# Patient Record
Sex: Female | Born: 1955 | Race: White | Hispanic: No | Marital: Married | Smoking: Former smoker
Health system: Southern US, Community
[De-identification: ages and names within clinical notes are randomized; demographics above are authoritative.]

## PROBLEM LIST (undated history)

## (undated) DIAGNOSIS — N952 Postmenopausal atrophic vaginitis: Secondary | ICD-10-CM

## (undated) DIAGNOSIS — E785 Hyperlipidemia, unspecified: Secondary | ICD-10-CM

## (undated) DIAGNOSIS — B059 Measles without complication: Secondary | ICD-10-CM

## (undated) DIAGNOSIS — K449 Diaphragmatic hernia without obstruction or gangrene: Principal | ICD-10-CM

## (undated) DIAGNOSIS — F329 Major depressive disorder, single episode, unspecified: Secondary | ICD-10-CM

## (undated) DIAGNOSIS — K219 Gastro-esophageal reflux disease without esophagitis: Secondary | ICD-10-CM

## (undated) DIAGNOSIS — M1990 Unspecified osteoarthritis, unspecified site: Secondary | ICD-10-CM

## (undated) DIAGNOSIS — B029 Zoster without complications: Secondary | ICD-10-CM

## (undated) DIAGNOSIS — R739 Hyperglycemia, unspecified: Secondary | ICD-10-CM

## (undated) DIAGNOSIS — Z0000 Encounter for general adult medical examination without abnormal findings: Principal | ICD-10-CM

## (undated) DIAGNOSIS — E559 Vitamin D deficiency, unspecified: Secondary | ICD-10-CM

## (undated) DIAGNOSIS — F32A Depression, unspecified: Secondary | ICD-10-CM

## (undated) DIAGNOSIS — B019 Varicella without complication: Secondary | ICD-10-CM

## (undated) DIAGNOSIS — C801 Malignant (primary) neoplasm, unspecified: Secondary | ICD-10-CM

## (undated) DIAGNOSIS — L738 Other specified follicular disorders: Secondary | ICD-10-CM

## (undated) HISTORY — DX: Vitamin D deficiency, unspecified: E55.9

## (undated) HISTORY — DX: Zoster without complications: B02.9

## (undated) HISTORY — DX: Gastro-esophageal reflux disease without esophagitis: K21.9

## (undated) HISTORY — DX: Varicella without complication: B01.9

## (undated) HISTORY — DX: Measles without complication: B05.9

## (undated) HISTORY — DX: Encounter for general adult medical examination without abnormal findings: Z00.00

## (undated) HISTORY — DX: Major depressive disorder, single episode, unspecified: F32.9

## (undated) HISTORY — DX: Hyperglycemia, unspecified: R73.9

## (undated) HISTORY — DX: Other specified follicular disorders: L73.8

## (undated) HISTORY — DX: Depression, unspecified: F32.A

## (undated) HISTORY — DX: Malignant (primary) neoplasm, unspecified: C80.1

## (undated) HISTORY — DX: Hyperlipidemia, unspecified: E78.5

## (undated) HISTORY — DX: Diaphragmatic hernia without obstruction or gangrene: K44.9

## (undated) HISTORY — DX: Postmenopausal atrophic vaginitis: N95.2

---

## 1977-01-13 HISTORY — PX: INDUCED ABORTION: SHX677

## 1996-01-14 HISTORY — PX: ABDOMINAL HYSTERECTOMY: SHX81

## 1996-01-14 LAB — HM PAP SMEAR: HM Pap smear: NORMAL

## 1997-11-28 ENCOUNTER — Ambulatory Visit (HOSPITAL_BASED_OUTPATIENT_CLINIC_OR_DEPARTMENT_OTHER): Admission: RE | Admit: 1997-11-28 | Discharge: 1997-11-28 | Payer: Self-pay | Admitting: *Deleted

## 1998-10-03 ENCOUNTER — Other Ambulatory Visit: Admission: RE | Admit: 1998-10-03 | Discharge: 1998-10-03 | Payer: Self-pay | Admitting: *Deleted

## 2004-12-30 ENCOUNTER — Encounter: Admission: RE | Admit: 2004-12-30 | Discharge: 2004-12-30 | Payer: Self-pay | Admitting: Family Medicine

## 2005-01-13 LAB — HM COLONOSCOPY: HM Colonoscopy: NORMAL

## 2008-01-14 DIAGNOSIS — C801 Malignant (primary) neoplasm, unspecified: Secondary | ICD-10-CM

## 2008-01-14 HISTORY — DX: Malignant (primary) neoplasm, unspecified: C80.1

## 2008-01-14 LAB — HM MAMMOGRAPHY: HM Mammogram: NORMAL

## 2008-01-27 ENCOUNTER — Encounter: Admission: RE | Admit: 2008-01-27 | Discharge: 2008-01-27 | Payer: Self-pay | Admitting: Gastroenterology

## 2008-01-31 ENCOUNTER — Ambulatory Visit: Payer: Self-pay | Admitting: Oncology

## 2008-02-07 ENCOUNTER — Ambulatory Visit (HOSPITAL_COMMUNITY): Admission: RE | Admit: 2008-02-07 | Discharge: 2008-02-07 | Payer: Self-pay | Admitting: Oncology

## 2008-02-08 ENCOUNTER — Encounter (INDEPENDENT_AMBULATORY_CARE_PROVIDER_SITE_OTHER): Payer: Self-pay | Admitting: Gastroenterology

## 2008-02-08 ENCOUNTER — Ambulatory Visit (HOSPITAL_COMMUNITY): Admission: RE | Admit: 2008-02-08 | Discharge: 2008-02-08 | Payer: Self-pay | Admitting: Gastroenterology

## 2008-02-16 ENCOUNTER — Ambulatory Visit: Admission: RE | Admit: 2008-02-16 | Discharge: 2008-04-19 | Payer: Self-pay | Admitting: Radiation Oncology

## 2008-03-06 ENCOUNTER — Ambulatory Visit (HOSPITAL_COMMUNITY): Admission: RE | Admit: 2008-03-06 | Discharge: 2008-03-06 | Payer: Self-pay | Admitting: Oncology

## 2008-03-06 LAB — MAGNESIUM: Magnesium: 2.3 mg/dL (ref 1.5–2.5)

## 2008-03-06 LAB — COMPREHENSIVE METABOLIC PANEL
ALT: 40 U/L — ABNORMAL HIGH (ref 0–35)
AST: 29 U/L (ref 0–37)
Albumin: 3.9 g/dL (ref 3.5–5.2)
Alkaline Phosphatase: 94 U/L (ref 39–117)
BUN: 16 mg/dL (ref 6–23)
CO2: 33 mEq/L — ABNORMAL HIGH (ref 19–32)
Calcium: 9.6 mg/dL (ref 8.4–10.5)
Chloride: 103 mEq/L (ref 96–112)
Creatinine, Ser: 0.66 mg/dL (ref 0.40–1.20)
Glucose, Bld: 98 mg/dL (ref 70–99)
Potassium: 4 mEq/L (ref 3.5–5.3)
Sodium: 141 mEq/L (ref 135–145)
Total Bilirubin: 0.4 mg/dL (ref 0.3–1.2)
Total Protein: 7.1 g/dL (ref 6.0–8.3)

## 2008-03-06 LAB — CBC WITH DIFFERENTIAL/PLATELET
BASO%: 0.3 % (ref 0.0–2.0)
Basophils Absolute: 0 10*3/uL (ref 0.0–0.1)
EOS%: 2.9 % (ref 0.0–7.0)
Eosinophils Absolute: 0.2 10*3/uL (ref 0.0–0.5)
HCT: 38.6 % (ref 34.8–46.6)
HGB: 12.8 g/dL (ref 11.6–15.9)
LYMPH%: 26.8 % (ref 14.0–49.7)
MCH: 31.4 pg (ref 25.1–34.0)
MCHC: 33.3 g/dL (ref 31.5–36.0)
MCV: 94.4 fL (ref 79.5–101.0)
MONO#: 0.4 10*3/uL (ref 0.1–0.9)
MONO%: 5.1 % (ref 0.0–14.0)
NEUT#: 4.7 10*3/uL (ref 1.5–6.5)
NEUT%: 64.9 % (ref 38.4–76.8)
Platelets: 347 10*3/uL (ref 145–400)
RBC: 4.09 10*6/uL (ref 3.70–5.45)
RDW: 12.7 % (ref 11.2–14.5)
WBC: 7.2 10*3/uL (ref 3.9–10.3)
lymph#: 1.9 10*3/uL (ref 0.9–3.3)

## 2008-03-06 LAB — FOLLICLE STIMULATING HORMONE: FSH: 101.8 m[IU]/mL

## 2008-03-07 LAB — TSH: TSH: 0.655 u[IU]/mL (ref 0.350–4.500)

## 2008-03-07 LAB — CEA: CEA: 3.4 ng/mL (ref 0.0–5.0)

## 2008-03-07 LAB — CREATININE CLEARANCE, URINE, 24 HOUR
Collection Interval-CRCL: 24 hours
Creatinine Clearance: 43 mL/min — ABNORMAL LOW (ref 75–115)
Creatinine, 24H Ur: 433 mg/d — ABNORMAL LOW (ref 700–1800)
Creatinine, Urine: 82.4 mg/dL
Creatinine: 0.66 mg/dL (ref 0.40–1.20)
Urine Total Volume-CRCL: 500 mL

## 2008-03-07 LAB — VITAMIN B12: Vitamin B-12: 1153 pg/mL — ABNORMAL HIGH (ref 211–911)

## 2008-03-07 LAB — VITAMIN D 25 HYDROXY (VIT D DEFICIENCY, FRACTURES): Vit D, 25-Hydroxy: 43 ng/mL (ref 30–89)

## 2008-03-09 LAB — BASIC METABOLIC PANEL
BUN: 16 mg/dL (ref 6–23)
CO2: 28 mEq/L (ref 19–32)
Calcium: 9.2 mg/dL (ref 8.4–10.5)
Chloride: 101 mEq/L (ref 96–112)
Creatinine, Ser: 0.69 mg/dL (ref 0.40–1.20)
Glucose, Bld: 121 mg/dL — ABNORMAL HIGH (ref 70–99)
Potassium: 3.7 mEq/L (ref 3.5–5.3)
Sodium: 135 mEq/L (ref 135–145)

## 2008-03-14 LAB — CBC WITH DIFFERENTIAL/PLATELET
BASO%: 0.1 % (ref 0.0–2.0)
Basophils Absolute: 0 10*3/uL (ref 0.0–0.1)
EOS%: 1.6 % (ref 0.0–7.0)
Eosinophils Absolute: 0.1 10*3/uL (ref 0.0–0.5)
HCT: 39.1 % (ref 34.8–46.6)
HGB: 13.3 g/dL (ref 11.6–15.9)
LYMPH%: 11.4 % — ABNORMAL LOW (ref 14.0–49.7)
MCH: 31.8 pg (ref 25.1–34.0)
MCHC: 34 g/dL (ref 31.5–36.0)
MCV: 93.3 fL (ref 79.5–101.0)
MONO#: 0.2 10*3/uL (ref 0.1–0.9)
MONO%: 4.8 % (ref 0.0–14.0)
NEUT#: 3.8 10*3/uL (ref 1.5–6.5)
NEUT%: 82.1 % — ABNORMAL HIGH (ref 38.4–76.8)
Platelets: 244 10*3/uL (ref 145–400)
RBC: 4.19 10*6/uL (ref 3.70–5.45)
RDW: 12 % (ref 11.2–14.5)
WBC: 4.6 10*3/uL (ref 3.9–10.3)
lymph#: 0.5 10*3/uL — ABNORMAL LOW (ref 0.9–3.3)

## 2008-03-14 LAB — COMPREHENSIVE METABOLIC PANEL
ALT: 93 U/L — ABNORMAL HIGH (ref 0–35)
AST: 23 U/L (ref 0–37)
Albumin: 3.7 g/dL (ref 3.5–5.2)
Alkaline Phosphatase: 81 U/L (ref 39–117)
BUN: 14 mg/dL (ref 6–23)
CO2: 26 mEq/L (ref 19–32)
Calcium: 9.1 mg/dL (ref 8.4–10.5)
Chloride: 98 mEq/L (ref 96–112)
Creatinine, Ser: 0.67 mg/dL (ref 0.40–1.20)
Glucose, Bld: 100 mg/dL — ABNORMAL HIGH (ref 70–99)
Potassium: 3.8 mEq/L (ref 3.5–5.3)
Sodium: 133 mEq/L — ABNORMAL LOW (ref 135–145)
Total Bilirubin: 0.7 mg/dL (ref 0.3–1.2)
Total Protein: 6.5 g/dL (ref 6.0–8.3)

## 2008-03-14 LAB — MAGNESIUM: Magnesium: 2 mg/dL (ref 1.5–2.5)

## 2008-03-15 LAB — ESTRADIOL, ULTRA SENS

## 2008-03-20 ENCOUNTER — Ambulatory Visit: Payer: Self-pay | Admitting: Oncology

## 2008-03-23 LAB — CBC WITH DIFFERENTIAL/PLATELET
BASO%: 0 % (ref 0.0–2.0)
Basophils Absolute: 0 10*3/uL (ref 0.0–0.1)
EOS%: 3.4 % (ref 0.0–7.0)
Eosinophils Absolute: 0.1 10*3/uL (ref 0.0–0.5)
HCT: 35.4 % (ref 34.8–46.6)
HGB: 12 g/dL (ref 11.6–15.9)
LYMPH%: 11.3 % — ABNORMAL LOW (ref 14.0–49.7)
MCH: 31.8 pg (ref 25.1–34.0)
MCHC: 34 g/dL (ref 31.5–36.0)
MCV: 93.4 fL (ref 79.5–101.0)
MONO#: 0.3 10*3/uL (ref 0.1–0.9)
MONO%: 8.9 % (ref 0.0–14.0)
NEUT#: 2.5 10*3/uL (ref 1.5–6.5)
NEUT%: 76.4 % (ref 38.4–76.8)
Platelets: 234 10*3/uL (ref 145–400)
RBC: 3.79 10*6/uL (ref 3.70–5.45)
RDW: 13.1 % (ref 11.2–14.5)
WBC: 3.3 10*3/uL — ABNORMAL LOW (ref 3.9–10.3)
lymph#: 0.4 10*3/uL — ABNORMAL LOW (ref 0.9–3.3)

## 2008-03-23 LAB — COMPREHENSIVE METABOLIC PANEL
ALT: 26 U/L (ref 0–35)
AST: 22 U/L (ref 0–37)
Albumin: 3.8 g/dL (ref 3.5–5.2)
Alkaline Phosphatase: 64 U/L (ref 39–117)
BUN: 15 mg/dL (ref 6–23)
CO2: 30 mEq/L (ref 19–32)
Calcium: 9 mg/dL (ref 8.4–10.5)
Chloride: 100 mEq/L (ref 96–112)
Creatinine, Ser: 0.68 mg/dL (ref 0.40–1.20)
Glucose, Bld: 99 mg/dL (ref 70–99)
Potassium: 3.8 mEq/L (ref 3.5–5.3)
Sodium: 137 mEq/L (ref 135–145)
Total Bilirubin: 0.8 mg/dL (ref 0.3–1.2)
Total Protein: 6.5 g/dL (ref 6.0–8.3)

## 2008-03-23 LAB — MAGNESIUM: Magnesium: 2.2 mg/dL (ref 1.5–2.5)

## 2008-03-28 LAB — CREATININE CLEARANCE, URINE, 24 HOUR
Collection Interval-CRCL: 24 hours
Creatinine Clearance: 134 mL/min — ABNORMAL HIGH (ref 75–115)
Creatinine, 24H Ur: 1160 mg/d (ref 700–1800)
Creatinine, Urine: 171.9 mg/dL
Creatinine: 0.6 mg/dL (ref 0.40–1.20)
Urine Total Volume-CRCL: 675 mL

## 2008-04-03 LAB — CBC WITH DIFFERENTIAL/PLATELET
BASO%: 0 % (ref 0.0–2.0)
Basophils Absolute: 0 10*3/uL (ref 0.0–0.1)
EOS%: 1.3 % (ref 0.0–7.0)
Eosinophils Absolute: 0.1 10*3/uL (ref 0.0–0.5)
HCT: 36.4 % (ref 34.8–46.6)
HGB: 12.3 g/dL (ref 11.6–15.9)
LYMPH%: 7.1 % — ABNORMAL LOW (ref 14.0–49.7)
MCH: 32 pg (ref 25.1–34.0)
MCHC: 33.8 g/dL (ref 31.5–36.0)
MCV: 94.6 fL (ref 79.5–101.0)
MONO#: 0.5 10*3/uL (ref 0.1–0.9)
MONO%: 11.6 % (ref 0.0–14.0)
NEUT#: 3.6 10*3/uL (ref 1.5–6.5)
NEUT%: 80 % — ABNORMAL HIGH (ref 38.4–76.8)
Platelets: 221 10*3/uL (ref 145–400)
RBC: 3.84 10*6/uL (ref 3.70–5.45)
RDW: 14.2 % (ref 11.2–14.5)
WBC: 4.5 10*3/uL (ref 3.9–10.3)
lymph#: 0.3 10*3/uL — ABNORMAL LOW (ref 0.9–3.3)

## 2008-04-03 LAB — COMPREHENSIVE METABOLIC PANEL
ALT: 25 U/L (ref 0–35)
AST: 20 U/L (ref 0–37)
Albumin: 4.3 g/dL (ref 3.5–5.2)
Alkaline Phosphatase: 68 U/L (ref 39–117)
BUN: 18 mg/dL (ref 6–23)
CO2: 28 mEq/L (ref 19–32)
Calcium: 9.7 mg/dL (ref 8.4–10.5)
Chloride: 103 mEq/L (ref 96–112)
Creatinine, Ser: 0.9 mg/dL (ref 0.40–1.20)
Glucose, Bld: 96 mg/dL (ref 70–99)
Potassium: 4.4 mEq/L (ref 3.5–5.3)
Sodium: 140 mEq/L (ref 135–145)
Total Bilirubin: 0.3 mg/dL (ref 0.3–1.2)
Total Protein: 6.7 g/dL (ref 6.0–8.3)

## 2008-04-03 LAB — MAGNESIUM: Magnesium: 2 mg/dL (ref 1.5–2.5)

## 2008-04-12 LAB — CBC WITH DIFFERENTIAL/PLATELET
BASO%: 0 % (ref 0.0–2.0)
Basophils Absolute: 0 10*3/uL (ref 0.0–0.1)
EOS%: 1.6 % (ref 0.0–7.0)
Eosinophils Absolute: 0.1 10*3/uL (ref 0.0–0.5)
HCT: 38.2 % (ref 34.8–46.6)
HGB: 13.1 g/dL (ref 11.6–15.9)
LYMPH%: 3.4 % — ABNORMAL LOW (ref 14.0–49.7)
MCH: 32 pg (ref 25.1–34.0)
MCHC: 34.3 g/dL (ref 31.5–36.0)
MCV: 93.1 fL (ref 79.5–101.0)
MONO#: 0.3 10*3/uL (ref 0.1–0.9)
MONO%: 7.3 % (ref 0.0–14.0)
NEUT#: 4.1 10*3/uL (ref 1.5–6.5)
NEUT%: 87.7 % — ABNORMAL HIGH (ref 38.4–76.8)
Platelets: 178 10*3/uL (ref 145–400)
RBC: 4.1 10*6/uL (ref 3.70–5.45)
RDW: 14.2 % (ref 11.2–14.5)
WBC: 4.7 10*3/uL (ref 3.9–10.3)
lymph#: 0.2 10*3/uL — ABNORMAL LOW (ref 0.9–3.3)

## 2008-04-12 LAB — COMPREHENSIVE METABOLIC PANEL
ALT: 69 U/L — ABNORMAL HIGH (ref 0–35)
AST: 25 U/L (ref 0–37)
Albumin: 3.8 g/dL (ref 3.5–5.2)
Alkaline Phosphatase: 76 U/L (ref 39–117)
BUN: 11 mg/dL (ref 6–23)
CO2: 28 mEq/L (ref 19–32)
Calcium: 9.2 mg/dL (ref 8.4–10.5)
Chloride: 98 mEq/L (ref 96–112)
Creatinine, Ser: 0.67 mg/dL (ref 0.40–1.20)
Glucose, Bld: 125 mg/dL — ABNORMAL HIGH (ref 70–99)
Potassium: 3.9 mEq/L (ref 3.5–5.3)
Sodium: 136 mEq/L (ref 135–145)
Total Bilirubin: 0.5 mg/dL (ref 0.3–1.2)
Total Protein: 6.6 g/dL (ref 6.0–8.3)

## 2008-04-12 LAB — MAGNESIUM: Magnesium: 1.8 mg/dL (ref 1.5–2.5)

## 2008-04-27 LAB — COMPREHENSIVE METABOLIC PANEL
ALT: 33 U/L (ref 0–35)
AST: 37 U/L (ref 0–37)
Albumin: 3.8 g/dL (ref 3.5–5.2)
Alkaline Phosphatase: 77 U/L (ref 39–117)
BUN: 16 mg/dL (ref 6–23)
CO2: 25 mEq/L (ref 19–32)
Calcium: 9 mg/dL (ref 8.4–10.5)
Chloride: 101 mEq/L (ref 96–112)
Creatinine, Ser: 0.62 mg/dL (ref 0.40–1.20)
Glucose, Bld: 134 mg/dL — ABNORMAL HIGH (ref 70–99)
Potassium: 4 mEq/L (ref 3.5–5.3)
Sodium: 136 mEq/L (ref 135–145)
Total Bilirubin: 0.2 mg/dL — ABNORMAL LOW (ref 0.3–1.2)
Total Protein: 5.9 g/dL — ABNORMAL LOW (ref 6.0–8.3)

## 2008-04-27 LAB — CBC WITH DIFFERENTIAL/PLATELET
BASO%: 0.3 % (ref 0.0–2.0)
Basophils Absolute: 0 10*3/uL (ref 0.0–0.1)
EOS%: 4.4 % (ref 0.0–7.0)
Eosinophils Absolute: 0.1 10*3/uL (ref 0.0–0.5)
HCT: 30.5 % — ABNORMAL LOW (ref 34.8–46.6)
HGB: 10.5 g/dL — ABNORMAL LOW (ref 11.6–15.9)
LYMPH%: 11.5 % — ABNORMAL LOW (ref 14.0–49.7)
MCH: 31.3 pg (ref 25.1–34.0)
MCHC: 34.4 g/dL (ref 31.5–36.0)
MCV: 90.8 fL (ref 79.5–101.0)
MONO#: 0.2 10*3/uL (ref 0.1–0.9)
MONO%: 7.8 % (ref 0.0–14.0)
NEUT#: 2.3 10*3/uL (ref 1.5–6.5)
NEUT%: 76 % (ref 38.4–76.8)
Platelets: 162 10*3/uL (ref 145–400)
RBC: 3.36 10*6/uL — ABNORMAL LOW (ref 3.70–5.45)
RDW: 15.2 % — ABNORMAL HIGH (ref 11.2–14.5)
WBC: 3 10*3/uL — ABNORMAL LOW (ref 3.9–10.3)
lymph#: 0.3 10*3/uL — ABNORMAL LOW (ref 0.9–3.3)
nRBC: 0 % (ref 0–0)

## 2008-04-27 LAB — MAGNESIUM: Magnesium: 1.9 mg/dL (ref 1.5–2.5)

## 2008-05-01 ENCOUNTER — Ambulatory Visit: Payer: Self-pay | Admitting: Oncology

## 2008-05-01 ENCOUNTER — Inpatient Hospital Stay (HOSPITAL_COMMUNITY): Admission: AD | Admit: 2008-05-01 | Discharge: 2008-05-05 | Payer: Self-pay | Admitting: Oncology

## 2008-05-13 HISTORY — PX: ESOPHAGECTOMY: SUR457

## 2008-06-02 ENCOUNTER — Ambulatory Visit: Payer: Self-pay | Admitting: Oncology

## 2008-07-31 ENCOUNTER — Ambulatory Visit: Payer: Self-pay | Admitting: Oncology

## 2008-08-09 ENCOUNTER — Ambulatory Visit (HOSPITAL_COMMUNITY): Admission: RE | Admit: 2008-08-09 | Discharge: 2008-08-09 | Payer: Self-pay | Admitting: Oncology

## 2008-08-29 ENCOUNTER — Ambulatory Visit: Payer: Self-pay | Admitting: Oncology

## 2009-01-19 ENCOUNTER — Ambulatory Visit: Payer: Self-pay | Admitting: Oncology

## 2009-01-19 LAB — CBC WITH DIFFERENTIAL/PLATELET
BASO%: 0.3 % (ref 0.0–2.0)
Basophils Absolute: 0 10*3/uL (ref 0.0–0.1)
EOS%: 2.5 % (ref 0.0–7.0)
Eosinophils Absolute: 0.1 10*3/uL (ref 0.0–0.5)
HCT: 39.1 % (ref 34.8–46.6)
HGB: 13.2 g/dL (ref 11.6–15.9)
LYMPH%: 17.2 % (ref 14.0–49.7)
MCH: 33.1 pg (ref 25.1–34.0)
MCHC: 33.6 g/dL (ref 31.5–36.0)
MCV: 98.3 fL (ref 79.5–101.0)
MONO#: 0.4 10*3/uL (ref 0.1–0.9)
MONO%: 10.1 % (ref 0.0–14.0)
NEUT#: 2.9 10*3/uL (ref 1.5–6.5)
NEUT%: 69.9 % (ref 38.4–76.8)
Platelets: 281 10*3/uL (ref 145–400)
RBC: 3.98 10*6/uL (ref 3.70–5.45)
RDW: 14.5 % (ref 11.2–14.5)
WBC: 4.2 10*3/uL (ref 3.9–10.3)
lymph#: 0.7 10*3/uL — ABNORMAL LOW (ref 0.9–3.3)

## 2009-01-19 LAB — TSH: TSH: 2.036 u[IU]/mL (ref 0.350–4.500)

## 2009-01-19 LAB — LIPID PANEL
Cholesterol: 200 mg/dL (ref 0–200)
HDL: 76 mg/dL (ref >39)
LDL Cholesterol: 109 mg/dL — ABNORMAL HIGH (ref 0–99)
Total CHOL/HDL Ratio: 2.6 Ratio
Triglycerides: 76 mg/dL (ref <150)
VLDL: 15 mg/dL (ref 0–40)

## 2009-06-06 ENCOUNTER — Ambulatory Visit: Payer: Self-pay | Admitting: Oncology

## 2009-11-29 ENCOUNTER — Ambulatory Visit: Payer: Self-pay | Admitting: Oncology

## 2010-04-22 ENCOUNTER — Ambulatory Visit (HOSPITAL_COMMUNITY)
Admission: RE | Admit: 2010-04-22 | Discharge: 2010-04-22 | Disposition: A | Discharge status: Home / Self Care | Payer: BC Managed Care – PPO | Source: Ambulatory Visit | Attending: Oncology | Admitting: Oncology

## 2010-04-22 ENCOUNTER — Encounter (HOSPITAL_BASED_OUTPATIENT_CLINIC_OR_DEPARTMENT_OTHER): Payer: BC Managed Care – PPO | Admitting: Oncology

## 2010-04-22 ENCOUNTER — Other Ambulatory Visit: Payer: Self-pay | Admitting: Oncology

## 2010-04-22 DIAGNOSIS — R630 Anorexia: Secondary | ICD-10-CM

## 2010-04-22 DIAGNOSIS — Z8501 Personal history of malignant neoplasm of esophagus: Secondary | ICD-10-CM | POA: Insufficient documentation

## 2010-04-22 DIAGNOSIS — C159 Malignant neoplasm of esophagus, unspecified: Secondary | ICD-10-CM

## 2010-04-22 DIAGNOSIS — B029 Zoster without complications: Secondary | ICD-10-CM

## 2010-04-22 DIAGNOSIS — C155 Malignant neoplasm of lower third of esophagus: Secondary | ICD-10-CM

## 2010-04-22 DIAGNOSIS — Z09 Encounter for follow-up examination after completed treatment for conditions other than malignant neoplasm: Secondary | ICD-10-CM | POA: Insufficient documentation

## 2010-04-24 LAB — DIFFERENTIAL
Basophils Absolute: 0 10*3/uL (ref 0.0–0.1)
Basophils Relative: 0 % (ref 0–1)
Eosinophils Absolute: 0 10*3/uL (ref 0.0–0.7)
Eosinophils Relative: 1 % (ref 0–5)
Lymphocytes Relative: 12 % (ref 12–46)
Lymphs Abs: 0.3 10*3/uL — ABNORMAL LOW (ref 0.7–4.0)
Monocytes Absolute: 0.4 10*3/uL (ref 0.1–1.0)
Monocytes Relative: 14 % — ABNORMAL HIGH (ref 3–12)
Neutro Abs: 1.9 10*3/uL (ref 1.7–7.7)
Neutrophils Relative %: 72 % (ref 43–77)

## 2010-04-24 LAB — BASIC METABOLIC PANEL
BUN: 6 mg/dL (ref 6–23)
CO2: 30 mEq/L (ref 19–32)
Calcium: 8.6 mg/dL (ref 8.4–10.5)
Chloride: 102 mEq/L (ref 96–112)
Creatinine, Ser: 0.6 mg/dL (ref 0.4–1.2)
GFR calc Af Amer: >60 mL/min (ref >60)
GFR calc non Af Amer: >60 mL/min (ref >60)
Glucose, Bld: 106 mg/dL — ABNORMAL HIGH (ref 70–99)
Potassium: 4.6 mEq/L (ref 3.5–5.1)
Sodium: 137 mEq/L (ref 135–145)

## 2010-04-24 LAB — COMPREHENSIVE METABOLIC PANEL
ALT: 33 U/L (ref 0–35)
AST: 30 U/L (ref 0–37)
Albumin: 3.4 g/dL — ABNORMAL LOW (ref 3.5–5.2)
Alkaline Phosphatase: 90 U/L (ref 39–117)
BUN: 12 mg/dL (ref 6–23)
CO2: 30 mEq/L (ref 19–32)
Calcium: 9.5 mg/dL (ref 8.4–10.5)
Chloride: 99 mEq/L (ref 96–112)
Creatinine, Ser: 0.67 mg/dL (ref 0.4–1.2)
GFR calc Af Amer: >60 mL/min (ref >60)
GFR calc non Af Amer: >60 mL/min (ref >60)
Glucose, Bld: 84 mg/dL (ref 70–99)
Potassium: 4.7 mEq/L (ref 3.5–5.1)
Sodium: 138 mEq/L (ref 135–145)
Total Bilirubin: 0.6 mg/dL (ref 0.3–1.2)
Total Protein: 6.5 g/dL (ref 6.0–8.3)

## 2010-04-24 LAB — MAGNESIUM
Magnesium: 1.8 mg/dL (ref 1.5–2.5)
Magnesium: 1.9 mg/dL (ref 1.5–2.5)
Magnesium: 1.9 mg/dL (ref 1.5–2.5)
Magnesium: 2.2 mg/dL (ref 1.5–2.5)

## 2010-04-24 LAB — CBC
HCT: 36.4 % (ref 36.0–46.0)
Hemoglobin: 12.3 g/dL (ref 12.0–15.0)
MCHC: 33.8 g/dL (ref 30.0–36.0)
MCV: 96.6 fL (ref 78.0–100.0)
Platelets: 306 10*3/uL (ref 150–400)
RBC: 3.77 MIL/uL — ABNORMAL LOW (ref 3.87–5.11)
RDW: 16.9 % — ABNORMAL HIGH (ref 11.5–15.5)
WBC: 2.7 10*3/uL — ABNORMAL LOW (ref 4.0–10.5)

## 2010-04-24 LAB — PROTIME-INR
INR: 1 (ref 0.00–1.49)
Prothrombin Time: 13 seconds (ref 11.6–15.2)

## 2010-04-24 LAB — PHOSPHORUS
Phosphorus: 3.1 mg/dL (ref 2.3–4.6)
Phosphorus: 3.4 mg/dL (ref 2.3–4.6)
Phosphorus: 3.9 mg/dL (ref 2.3–4.6)

## 2010-04-24 LAB — PREALBUMIN: Prealbumin: 11.5 mg/dL — ABNORMAL LOW (ref 18.0–45.0)

## 2010-04-29 LAB — GLUCOSE, CAPILLARY: Glucose-Capillary: 102 mg/dL — ABNORMAL HIGH (ref 70–99)

## 2010-05-28 NOTE — Op Note (Signed)
NAMESHELIAH, [name]              ACCOUNT NO.:  [id number]   MEDICAL RECORD NO.:  [id number]          PATIENT TYPE:  INP   LOCATION:  1338                         FACILITY:  [facility]   PHYSICIAN:  Velora Heckler, MD      DATE OF BIRTH:  1955/10/26   DATE OF PROCEDURE:  05/04/2008  DATE OF DISCHARGE:                               OPERATIVE REPORT   PREOPERATIVE DIAGNOSIS:  Adenocarcinoma of gastroesophageal junction, T3  N1.   POSTOPERATIVE DIAGNOSIS:  Adenocarcinoma of gastroesophageal junction,  T3 N1.   PROCEDURE:  Placement of surgical jejunostomy feeding tube (14-French  red rubber Robinson catheter).   SURGEON:  Darnell Level, M.D., FACS   ANESTHESIA:  General per Dr. Lucille Passy.   ESTIMATED BLOOD LOSS:  Minimal.   PREPARATION:  ChloraPrep.   COMPLICATIONS:  None.   INDICATIONS:  The patient is a 55 year old white female diagnosed with a  T3 N1 adenocarcinoma at the gastroesophageal junction.  She is receiving  neoadjuvant treatment with 5-FU, cis-platinum, and radiation therapy.  She is scheduled for resection at [facility] in [place], [place], in 2  weeks.  Jejunostomy feeding tube is desired for perioperative nutrition.   BODY OF REPORT:  Procedure is done in OR #11 at the [facility].  The patient is brought to the operating room and  placed in supine position on the operating room table.  Following  administration of general anesthesia, the patient is prepped and draped  in usual strict aseptic fashion.  After ascertaining that an adequate  level of anesthesia had been achieved, a midline abdominal incision was  made at the level of the umbilicus.  Dissection was carried through  subcutaneous tissues.  Fascia was incised in the midline, and the  peritoneal cavity was entered cautiously.  Small bowel was run  retrograde until the previous Panda tube was identified within the lumen  of the bowel.  Ligament of Treitz was visualized.  A point  approximately  40 cm downstream from the ligament of Treitz was selected.  A 3-0 silk  pursestring suture was placed.  Enterotomy was made at the anti  mesenteric border.  A 14-French red rubber Robinson catheter was  prepared in the usual fashion and inserted through the enterotomy and  threaded distally in the small bowel.  Pursestring sutures tied  securely.  A 2-cm Witzel tunnel was created with interrupted 3-0 silk  sutures.   Next, four 3-0 silk sutures were placed circumferentially in the serosa  of the bowel surrounding the jejunostomy tube.  An incision was then  made in the left upper abdominal wall.  Using a hemostat, the  jejunostomy tube was delivered from the peritoneal cavity through the  abdominal wall.  The circumferential silk sutures were then placed into  the peritoneal layer and tied securely, approximating the small bowel to  the peritoneum with good seal around the red rubber Robinson catheter.  Catheter was sutured to the skin with a 3-0 nylon suture.  Omentum was  used to cover the small bowel.  The midline incision was closed with  interrupted #  1-0 Vicryl simple sutures.  Subcutaneous tissues were  irrigated.  Skin was closed with a running 4-0 Monocryl subcuticular  suture.  Benzoin and Steri-Strips were applied.  Jejunostomy tube  flushes easily with saline.  A plug was placed in the catheter.  Panda  tube was removed at the conclusion of the procedure.   Sterile dressings were applied.  The patient was awakened from  anesthesia and brought to the recovery room in stable condition.  The  patient tolerated the procedure well.      Velora Heckler, MD  Electronically Signed     TMG/MEDQ  D:  05/04/2008  T:  05/04/2008  Job:  [id number]   cc:   Velora Heckler, MD  [address]  [place]  [place] [address]   Leighton Roach. Truett Perna, M.D.  Fax: [phone]

## 2010-05-28 NOTE — H&P (Signed)
Lauren [name], Lauren [name]              ACCOUNT NO.:  [id number]   MEDICAL RECORD NO.:  [id number]          PATIENT TYPE:  INP   LOCATION:  1338                         FACILITY:  [facility]   PHYSICIAN:  Leighton Roach. Truett Perna, M.D. DATE OF BIRTH:  09/08/1955   DATE OF ADMISSION:  05/01/2008  DATE OF DISCHARGE:                              HISTORY & PHYSICAL   PATIENT IDENTIFICATION:  Lauren [name] is a 55 year old with esophagus  cancer.  She is now admitted with severe odynophagia and dehydration.   HISTORY OF PRESENT ILLNESS:  Lauren [name] was diagnosed with  adenocarcinoma at the GE junction when she presented with dysphagia in  January of this year.  She underwent a staging evaluation which  confirmed an ultrasound stage T3 N1 lesion.   She completed treatment with concurrent 5-FU/cisplatin and radiation.  The radiation was completed on April 1.   She lost a significant amount of weight throughout the course of  therapy, but she was able to tolerate liquids and solids when I saw her  on April 15.   She reports the onset of severe pain at the mid upper back beginning on  April 17.  The pain was partially relieved with Roxicet and Dilaudid.  The pain is now constant and severe.  She has been unable to eat or  drink for the past several days.  She presented to the office for  further evaluation.   PAST MEDICAL HISTORY:  Depression.   PAST HISTORY:  1. Status post excision of a skin cancer of the left arm in December      of 2009.  2. Status post hysterectomy for treatment of a fibroid tumor.  3. Status post tonsillectomy.   CURRENT MEDICATIONS:  1. Prilosec 40 mg q. day - started April 18.  2. Roxicet elixir 5 mL every 4 hours p.r.n.  3. Compazine 10 mg every 6 hours p.r.n.  4. Multivitamin daily.  5. Vitamin C daily.  6. Vitamin B12.  7. Effexor 75 mg daily.  8. Armour thyroid, thyroid 15 - 30 mg daily.   ALLERGIES:  PENICILLIN.   FAMILY HISTORY:  Her mother had breast cancer.   Her father died at age  41 with heart failure.  A paternal grandmother had stomach cancer .   SOCIAL HISTORY:  She lives in [place] with her husband.  She works in  Chief Financial Officer at [employer].  She smokes cigarettes socially  .  She drinks two glasses of wine per night.  She has no history of a  blood transfusion.   REVIEW OF SYSTEMS:  CONSTITUTIONAL:  She denies fever.  She has  developed anorexia and weight loss during treatment for esophagus  cancer.  RESPIRATORY:  Negative.  CARDIAC:  Negative.  GU:  Negative.  GI:  She has intermittent small volume diarrhea.  NEUROLOGIC:  Negative.   EXAM:  Blood pressure 125/81, pulse 96, temperature 97, weight 108.6  (115.8 on April 15).  HEENT:  No ulcers.  There is a white coat over the tongue.  No buccal  thrush.  LUNGS:  Clear.  CARDIAC:  Regular  rhythm.  ABDOMEN:  Nontender.  No hepatomegaly.  EXTREMITIES:  No edema.  The skin turgor is diminished.  No palpable  cervical or clavicular nodes.  MUSCULOSKELETAL:  No spine or chest wall tenderness.   LABORATORIES:  CBC and chemistry panel pending.   IMPRESSION:  1. Clinical stage III (UT3, N1) adenocarcinoma of the gastroesophageal      junction, status post concurrent 5-FU/cisplatin chemotherapy and      radiation.  Radiation was completed on April 1.  2. Odynophagia, likely related to radiation/chemotherapy induced      esophagitis.  3. Anorexia/weight loss.  4. Dehydration.  5. History of mucositis secondary to 5-fluorouricil.  6. History of depression.   PLAN:  1. Admit for intravenous hydration and narcotic pain control.  2. Consult Dr. Loreta Ave for evaluation of the odynophagia.   Lauren [name] has completed 5-FU/cisplatin chemotherapy and radiation for  treatment of an adenocarcinoma of the gastroesophageal junction.  She  presents with severe odynophagia and mid upper back pain approximately 2  weeks from  the completion of radiation.   The pain is most likely  related to treatment induced esophagitis.  The  differential diagnosis includes pain due to tumor progression and pain  related to metastatic disease.   She will be admitted for intravenous hydration and further diagnostic  evaluation.  I will discuss the case with Dr. Loreta Ave to decide on the  indication for a repeat endoscopy.   Lauren [name] is scheduled for an evaluation at the [facility] on May 3.  She is to be considered for surgical resection.  This  evaluation may be delayed pending her nutritional status over the next  few weeks.      Leighton Roach Truett Perna, M.D.  Electronically Signed     GBS/MEDQ  D:  05/01/2008  T:  05/01/2008  Job:  [id number]   cc:   Anselmo Rod, M.D.  Fax: [phone]   Jordan Hawks. Elnoria Howard, MD  Fax: [phone]   Radene Gunning, MD, PhD  Fax: [phone]   Alexia Freestone, M.D.  Fax: [phone]   Jeralyn Ruths, Dr.  [address]  [place], [place] [address]   Norm Salt. O'Neil, Dr.  [address]  [place], [place] [address]  Ph:  [phone]   Lendon Colonel, Dr.  [facility]  Dept. of Surgery  [address]  [place], [place] [address]  Ph:  [phone]

## 2010-05-28 NOTE — Consult Note (Signed)
Lauren [name], Lauren [name]              ACCOUNT NO.:  [id number]   MEDICAL RECORD NO.:  [id number]          PATIENT TYPE:  INP   LOCATION:  1338                         FACILITY:  [facility]   PHYSICIAN:  Wilmon Arms. Corliss Skains, M.D. DATE OF BIRTH:  [birth year]   DATE OF CONSULTATION:  05/03/2008  DATE OF DISCHARGE:                                 CONSULTATION   CONSULTING PHYSICIANS:  Leighton Roach. Truett Perna, M.D.  Anselmo Rod, M.D.   REASON FOR CONSULTATION:  Jejunostomy tube placement.   HISTORY OF PRESENT ILLNESS:  This is a 55 year old female who was  recently diagnosed with adenocarcinoma of the esophagus and the distal  cardia in January 2010.  Staging workup showed a stage T3 N1 lesion.  The patient was treated with 5-FU and cisplatin as well as radiation,  completed on April 13, 2008.  She presented with severe odynophagia and  is having difficulty taking adequate p.o. intake.  She is scheduled to  travel to [place], [place] to [place] [place] in May for an esophagectomy,  partial gastrectomy.  We are asked to provide feeding access to improve  her nutritional status prior to her surgery.  We are also asked to avoid  any surgery of the stomach to allow uncomplicated reconstruction.   PAST SURGICAL HISTORY:  1. Hysterectomy via Pfannenstiel incision.  2. Tonsillectomy.  3. Left arm squamous cell carcinoma excision.   PAST MEDICAL HISTORY:  1. Hyperlipidemia.  2. Anxiety.  3. Depression.   MEDICATIONS:  1. Prilosec.  2. Roxicet elixir.  3. Compazine.  4. Multivitamin.  5. Effexor.  6. Thyroid hormone replacement   ALLERGIES:  PENICILLIN which causes hives.   SOCIAL HISTORY:  The patient smokes cigarettes occasionally, has 2  glasses of wine per night.   REVIEW OF SYSTEMS:  Positive for anorexia and weight loss secondary to  her cancer treatment.  GI:  Intermittent small volume diarrhea.   PHYSICAL EXAMINATION:  Afebrile with stable vital signs.  This is a thin female in no apparent  distress.  She has a nasoenteric  feeding tube, which unfortunately is currently running tube feeds.  Sclerae anicteric.  NECK:  No masses, no thyromegaly.  LUNGS:  Clear.  Normal respiratory effort.  HEART:  Regular rate and rhythm.  ABDOMEN:  Soft, nontender.  No palpable masses.  No rebound or guarding.  EXTREMITIES:  No edema.  SKIN:  Warm and dry with no sign of jaundice.   LABS:  White count 2.7, hemoglobin 12.3, platelet count 306.  Electrolytes within normal limits.   IMPRESSION:  1. Adenocarcinoma of the gastroesophageal junction.  2. Needs enteral feeding access to improve her nutritional status      prior to her esophagectomy   PLAN:  Jejunostomy tube placement tomorrow.  I counseled the patient on  the plan for the operation, but I did inform her that this would be  performed by the surgeon on call tomorrow rather myself.  We cannot  operate on her today as her tube feeds are currently running.      Wilmon Arms. Tsuei, M.D.  Electronically Signed  MKT/MEDQ  D:  05/03/2008  T:  05/03/2008  Job:  [id number]   cc:   Leighton Roach. Truett Perna, M.D.  Fax: [phone]   UXLKGM WNU UVOZ, M.D.  Fax: [phone]

## 2010-05-28 NOTE — Discharge Summary (Signed)
NAMEGIULLIANA, [name]              ACCOUNT NO.:  [id number]   MEDICAL RECORD NO.:  [id number]          PATIENT TYPE:  INP   LOCATION:  1338                         FACILITY:  [facility]   PHYSICIAN:  Leighton Roach. Truett Perna, M.D. DATE OF BIRTH:  Jul 22, 1955   DATE OF ADMISSION:  05/01/2008  DATE OF DISCHARGE:  05/05/2008                               DISCHARGE SUMMARY   DISCHARGE DIAGNOSES:  1. Clinical stage III adenocarcinoma gastroesophageal junction status      post chemotherapy and radiation therapy.  2. Odynophagia secondary to esophagitis related to chemo and radiation      therapy, improved at discharge.  3. Status post jejunostomy tube placement May 04, 2008.  4. Anorexia/weight loss.  5. Oral candidiasis status post course of Diflucan.  6. Mucositis secondary to 5-FU chemotherapy, resolved.  7. History of depression.   CONSULTATIONS:  1. Dr. Anselmo Rod, gastroenterology.  2. Dr. Manus Rudd, general surgery.   PROCEDURES/STUDIES:  1. Barium swallow May 02, 2008 with findings of mild narrowing of      the distal esophagus/GE junction causing mild transient delay of      the barium tablets.  No evidence of complete obstruction.  2. Status post placement of a nasogastric feeding tube under      fluoroscopy May 02, 2008.  3. Status post placement of a jejunostomy feeding tube by Dr. Gerrit Friends      May 04, 2008.   HISTORY OF PRESENT ILLNESS:  Lauren [name] is a 55 year old woman with  esophagus cancer.  Initial diagnosis dates to January 8 of this year  when she presented with dysphasia.  She underwent a staging evaluation  which confirmed an ultrasound of stage T3 and N1 lesion.  She completed  treatment with concurrent 5-FU/cisplatin and radiation.  The course of  radiation was completed on April 13, 2008.  Throughout the course of  therapy, she lost a significant amount of weight.  On April 29, 2008 she  developed onset of severe pain at the mid upper back.  Pain was  partially relieved with Roxicet and Dilaudid.  She was unable to eat or  drink for several days.  She presented to the office May 01, 2008 for  further evaluation and was subsequently admitted to the hospital.   HISTORY OF PRESENT ILLNESS:  Lauren [name] was admitted to the oncology  unit [facility] on May 01, 2008 with odynophagia felt to  likely be related to radiation and chemotherapy induced esophagitis.  Intravenous hydration was initiated.  Admission labs showed hemoglobin  12.3, white count 2.7, absolute neutrophil count 1.9, platelet count  306,000.  Sodium 138, potassium 4.7, chloride 99, CO2 30, glucose 84,  BUN 12, creatinine 0.67, bilirubin 0.6, alkaline phosphatase 90, SGOT  30, SGPT 33, total protein 6.5, albumin 3.4, calcium 9.5, magnesium 2.2.  For pain control, she required frequent intravenous Dilaudid initially.  Dr. Loreta Ave was consulted from GI with recommendations for a barium  swallow, Carafate suspension and probable nasogastric tube placement for  feedings.   Barium swallow done May 02, 2008 showed mild narrowing of the distal  esophagus/GE junction causing mild transient delay of the barium tablet.  There was no evidence of complete obstruction.  The radiologist  commented that the area of narrowing likely represented the patient's  known tumor/radiation changes.  A nasogastric feeding tube was  subsequently placed under fluoroscopy in radiology.  Tube feedings were  subsequently initiated.  Lauren [name] subsequently requested jejunostomy  tube placement for continuation of tube feedings.  General surgery was  consulted for J tube placement.  On May 05, 2008 Lauren [name]  underwent placement of a jejunostomy tube by Dr. Gerrit Friends.  Lauren [name]  was followed by the hospital dietician throughout her stay.  She was  educated on tube feed advancement, free water flushes, and how to  monitor her tolerance of the tube feeds.  At that time of discharge  she  was tolerating the tube feedings at the current rate and was able to  demonstrate proper administration of tube feedings and water flushes.  Arrangements were made, however, for her to be followed by a home health  registered nurse following discharge.   The severe pain Lauren [name] was experiencing upon admission improved  considerably during the hospitalization.  She was able to tolerate some  oral intake.  However, her primary source of nutrition was via the  feeding tube.   On May 05, 2008 Lauren [name] was felt to be stable for discharge home.  As noted above.  Arrangements were made for a home health nurse.   DISCHARGE DISPOSITION:  1. Condition - improved.  2. Activity - increase activity slowly.  3. Wound care, keep dry gauze dressing around the J tube; clean      incisions gently and pat dry; paper strips will gradually come off      in 7-10 days.   DISCHARGE MEDICATIONS:  1. Prilosec 20 mg twice daily.  2. Armour Thyroid 30 mg daily.  3. Carafate 10 mL four times daily.  4. Effexor XR 70 mg daily.  5. Dilaudid 4 mg 1-2 tablets every 4 hours as needed for pain.  6. Compazine 10 mg every 6 hours as needed for nausea.   TUBE FEEDINGS AND INSTRUCTIONS:  Pivot at 27 mL per hour today; advanced  to 37 mL on May 06, 2008 at 8 a.m. and 247 mL on May 07, 2008 at 8  a.m.  Flush tube 140 mL of water six times per day (may decrease if  drinking more fluids).  Hold feeding if nausea/vomiting, abdominal  distention/pain.  Must sit up at least 30 degrees while on tube  feedings.      Lauren [name], N.P.      Leighton Roach. Truett Perna, M.D.  Electronically Signed    LT/MEDQ  D:  07/07/2008  T:  07/07/2008  Job:  [id number]

## 2010-05-28 NOTE — Consult Note (Signed)
Lauren [name], Lauren [name]              ACCOUNT NO.:  [id number]   MEDICAL RECORD NO.:  [id number]          PATIENT TYPE:  INP   LOCATION:  1338                         FACILITY:  [facility]   PHYSICIAN:  Anselmo Rod, M.D.  DATE OF BIRTH:  February 22, 1955   DATE OF CONSULTATION:  05/01/2008  DATE OF DISCHARGE:                                 CONSULTATION   REASON FOR CONSULTATION:  Difficulty swallowing with abnormal weight  loss.   ASSESSMENT:  1. Chest pain with dysphagia and odynophagia in a 55 year old white      female, status post radiation treatment for adenocarcinoma of the      esophagus involving the cardia of the stomach, rule out radiation      esophagitis versus stricture.  2. Abnormal weight loss secondary to decreased p.o. intake (57 pound      weight loss since January 2010).  3. Mucositis and thrush, status post treatment with 5-flurouracil.  4. History of depression.  5. History of hysterectomy for fibroids in the past.  6. Head trauma in 2009.   RECOMMENDATIONS:  1. Barium swallow is planned for the patient tomorrow morning.  2. Carafate suspension has been ordered 1 gram q.i.d. in between meals      and at bedtime.  3. Alternate the Carafate with viscous lidocaine.  4. Feeding will be started depending on the findings of the barium      swallow tomorrow.  Nasogastric route is probably the optimal      choice.  If this is not possible, TPN will be undertaken.   DISCUSSION:  Ms. Pfeifer is a very pleasant 55 year old white female  with the above mentioned medical problems who was first diagnosed with  adenocarcinoma of the esophagus and the distal cardia in January 2010,  when she underwent an endoscopy on January 21, 2008, followed by an  endoscopic ultrasound on February 08, 2008, with an FNA which showed  malignant cells in the adjacent lymph node.  The patient took chemo and  radiation therapy since then and has had problems as mentioned above.  She has had  intermittent problems with nausea and vomiting almost on a  daily basis, a couple times a day.  For the last couple of days, she had  severe chest pain between her shoulders with a burning sensation in the  back which is describes as a large fire ball, prompting her to seek  her medical attention.  She spoke to [place] last night, who was on-call for  Dr. Truett Perna and was asked to come into the hospital today.  She is  being admitted for IV hydration and was she was found to be dehydrated  and for possible feeding via TPN or nasogastric route.  Her appetite is  fair.  She has had a 32 pound weight loss as mentioned above.  There is  no history of fevers, chills or rigors.  The patient is scheduled to go  to [place] [facility] on May 15, [place], and is scheduled to have surgery the  next week thereafter if all is well.  Therefore, she is concerned about  her baseline status before she leaves for [place].   PAST MEDICAL HISTORY:  See list above.   ALLERGIES:  SHE IS ALLERGIC TO PENICILLIN WHICH CAUSES HIVES.   MEDICATIONS IN THE HOSPITAL:  1. Fluconazole for oral thrush.  2. Pantoprazole IV.  3. Tylenol p.r.n.  4. Maalox p.r.n.  5. Zofran p.r.n.  6. Compazine p.r.n.  7. Ambien for insomnia and nervousness.   PHYSICAL EXAMINATION:  GENERAL:  Reveals a very pleasant cooperative  anxious middle-aged female sitting up in bed.  VITAL SIGNS:  Temperature 97.9, blood pressure 123/80, pulse 80 per  minute, respiratory rate 20 per minute.  HEENT:  PERRLA.  Facial symmetry preserved.  Oral mucosa shows some  whitish exudates.  NECK:  Supple.  CHEST:  Clear to auscultation.  S1-S2 regular.  ABDOMEN:  Soft, nontender with normal bowel sounds.  No  hepatosplenomegaly appreciated.   LABORATORY DATA:  Hemoglobin 12.3 with white count of 2.7 and platelets  of 306.  Neutrophils are normal at 72%.  CMP reveals a sodium of 138,  potassium 4.7, chloride 99, CO2 of 30, glucose 64, BUN 12, creatinine  0.67, total  bili 0.6, alk phos 90, AST 30, ALT 36, total protein 6.3,  albumin 3.4, calcium 9.5.  Magnesium is normal at 2.2.  The patient had  a PET scan done on February 07, 2008, that showed hypermetabolic activity  at the GE junction, but no evidence of mets in the neck, chest, abdomen  or pelvis.   Plans as above.  Further recommendations to be made in follow up.  Her  treatment plan has been discussed with Dr. Mitzi Hansen and with Dr. Mancel Bale.      Anselmo Rod, M.D.  Electronically Signed     JNM/MEDQ  D:  05/01/2008  T:  05/01/2008  Job:  [id number]   cc:   Leighton Roach. Truett Perna, M.D.  Fax: [phone]   Radene Gunning, MD, PhD  Fax: [phone]

## 2011-01-18 ENCOUNTER — Telehealth: Payer: Self-pay | Admitting: Oncology

## 2011-01-18 NOTE — Telephone Encounter (Signed)
lmonvm adviisng the pt of her feb 2013 appt with dr Truett Perna

## 2011-02-28 ENCOUNTER — Ambulatory Visit: Payer: BC Managed Care – PPO | Admitting: Oncology

## 2011-08-22 ENCOUNTER — Telehealth: Payer: Self-pay | Admitting: *Deleted

## 2011-08-22 NOTE — Telephone Encounter (Signed)
Call from pt requesting lab appt to have her thyroid and hormone levels checked. Returned call, pt stated she has been fatigued and would like labs done. Suggested she contact her PCP to work up fatigue. Offered to reschedule missed appt. (Failed to keep 6 mo F/U 10/2010) Pt declined. She stated she will follow up with her PCP.

## 2013-01-17 ENCOUNTER — Telehealth: Payer: Self-pay | Admitting: *Deleted

## 2013-01-17 NOTE — Telephone Encounter (Signed)
VM from patient requesting appointment to come in for labs/urine testing and physician appointment. Called back and left VM requesting she let us know if she is requesting to re-establish for routine follow-up or is she having any new problems?

## 2013-01-18 ENCOUNTER — Telehealth: Payer: Self-pay | Admitting: Oncology

## 2013-01-18 ENCOUNTER — Other Ambulatory Visit: Payer: Self-pay | Admitting: *Deleted

## 2013-01-18 NOTE — Telephone Encounter (Signed)
Called pt,left message regarding MD appt on 02/11/13

## 2013-01-18 NOTE — Telephone Encounter (Signed)
Made patient aware that Dr. Benay Spice will see her in late January-labs will be ordered after he sees her, and she can obtain these on the way out.

## 2013-01-18 NOTE — Progress Notes (Signed)
Per Dr. Benay Spice : Will see patient in 1st available 30 minute spot in January -no lab at this time. POF to scheduler.

## 2013-01-19 ENCOUNTER — Telehealth: Payer: Self-pay | Admitting: Oncology

## 2013-01-19 NOTE — Telephone Encounter (Signed)
pt called re r/s 1/30 appt due to she will be out ot town. pt given new appt for 2/9 @ 11am.

## 2013-02-11 ENCOUNTER — Ambulatory Visit: Payer: BC Managed Care – PPO | Admitting: Oncology

## 2013-02-21 ENCOUNTER — Telehealth: Payer: Self-pay | Admitting: Oncology

## 2013-02-21 ENCOUNTER — Ambulatory Visit: Payer: BC Managed Care – PPO | Admitting: Oncology

## 2013-02-21 NOTE — Telephone Encounter (Signed)
pt came to appt today at wrong time. r/s appt to 2/12. pt has new d/t.

## 2013-02-23 ENCOUNTER — Telehealth: Payer: Self-pay | Admitting: Oncology

## 2013-02-23 NOTE — Telephone Encounter (Signed)
received staff message from desk nurse 2/10 asking that 2/12 f/u be r/s as not aenough time has been allowed for appt. BS has no other availablilty and pt is concerned about having lb/urine test and states she will not take very much time. message relayed to desk nurse - pt aware. per desk nurse she will discuss with BS when he calls in this afternoon. pt aware I will get back w/her after hearing from [place] and is aware not to come tomorrow unless she hears from me.

## 2013-02-24 ENCOUNTER — Telehealth: Payer: Self-pay | Admitting: Oncology

## 2013-02-24 ENCOUNTER — Encounter: Payer: Self-pay | Admitting: Oncology

## 2013-02-24 ENCOUNTER — Ambulatory Visit (HOSPITAL_BASED_OUTPATIENT_CLINIC_OR_DEPARTMENT_OTHER): Payer: BC Managed Care – PPO | Admitting: Oncology

## 2013-02-24 VITALS — BP 117/77 | HR 86 | Temp 98.3°F | Resp 18 | Ht 64.0 in | Wt 121.1 lb

## 2013-02-24 DIAGNOSIS — C159 Malignant neoplasm of esophagus, unspecified: Secondary | ICD-10-CM

## 2013-02-24 DIAGNOSIS — C160 Malignant neoplasm of cardia: Secondary | ICD-10-CM

## 2013-02-24 NOTE — Telephone Encounter (Signed)
, °

## 2013-02-24 NOTE — Progress Notes (Signed)
Has appointment for endoscopy on 03/03/13 per Dr. Carol Ada.

## 2013-02-24 NOTE — Telephone Encounter (Signed)
gv and printed appt sched and avs for pt for Feb 2016 °

## 2013-02-24 NOTE — Progress Notes (Signed)
   [facility]    OFFICE PROGRESS NOTE   INTERVAL HISTORY:   [name] was last seen at the [facility] in April of 2012. She reports feeling well. Good appetite and energy level. Mild intermittent solid dysphagia is her only complaint. She saw Dr. [name] and is scheduled for an endoscopy with esophageal dilatation as indicated next week.  She plans to obtain a primary physician with [facility].  Objective:  Vital signs in last 24 hours:  Blood pressure 117/77, pulse 86, temperature 98.3 F (36.8 C), temperature source Oral, resp. rate 18, height 5\' 4"  (1.626 m), weight 121 lb 1.6 oz (54.931 kg).    HEENT: Neck without mass Lymphatics: No cervical, supraclavicular, or axillary nodes Resp: Decreased breath sounds with end inspiratory rhonchi at the right posterior base, good air movement bilaterally, no respiratory distress Cardio: Regular rate and rhythm GI: No hepatomegaly, nontender, no mass Vascular: No leg edema    Medications: I have reviewed the patient's current medications.  Assessment/Plan:  1. 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 She is status post an esophagectomy at [facility] on 06/05/2008 confirming a rare focus of residual adenocarcinoma involving the distal esophageal mucosa and proximal gastric muscularis propria. No metastatic tumor was seen in a total of 56 examined lymph nodes.  2. History of esophageal stricture, status post an esophageal dilatation procedure by Dr. [name], she is scheduled for a repeat EGD next week.  3. Multiple noncaseating granulomas identified in the lymph nodes from the esophagectomy procedure, stains for AFB and fungi negative.? Sarcoidosis.  4. History of herpes zoster at the right back and right anterior chest wall     Disposition:  [name] is now greater than 5 years out  from being diagnosed with adenocarcinoma of the GE junction. She remains in clinical remission. She will followup with Dr. [name] for Evaluation of the Dysphagia. She will return for an office visit here in one year.  She plans to obtain a primary care physician at [place].  Betsy Coder, MD  02/24/2013  2:01 PM

## 2013-02-24 NOTE — Telephone Encounter (Signed)
per desk nurse 02/23/13 ok for pt to come in as scheduled 2/12 @ 11am. s/w pt this morning she is aware

## 2013-05-17 ENCOUNTER — Ambulatory Visit (INDEPENDENT_AMBULATORY_CARE_PROVIDER_SITE_OTHER): Payer: BC Managed Care – PPO | Admitting: Family Medicine

## 2013-05-17 ENCOUNTER — Encounter: Payer: Self-pay | Admitting: Family Medicine

## 2013-05-17 VITALS — BP 104/64 | HR 86 | Temp 98.4°F | Ht 64.0 in | Wt 117.0 lb

## 2013-05-17 DIAGNOSIS — F329 Major depressive disorder, single episode, unspecified: Secondary | ICD-10-CM

## 2013-05-17 DIAGNOSIS — B059 Measles without complication: Secondary | ICD-10-CM | POA: Insufficient documentation

## 2013-05-17 DIAGNOSIS — N952 Postmenopausal atrophic vaginitis: Secondary | ICD-10-CM

## 2013-05-17 DIAGNOSIS — C801 Malignant (primary) neoplasm, unspecified: Secondary | ICD-10-CM | POA: Insufficient documentation

## 2013-05-17 DIAGNOSIS — E559 Vitamin D deficiency, unspecified: Secondary | ICD-10-CM

## 2013-05-17 DIAGNOSIS — K449 Diaphragmatic hernia without obstruction or gangrene: Secondary | ICD-10-CM

## 2013-05-17 DIAGNOSIS — B019 Varicella without complication: Secondary | ICD-10-CM | POA: Insufficient documentation

## 2013-05-17 DIAGNOSIS — F3289 Other specified depressive episodes: Secondary | ICD-10-CM

## 2013-05-17 DIAGNOSIS — F32A Depression, unspecified: Secondary | ICD-10-CM | POA: Insufficient documentation

## 2013-05-17 DIAGNOSIS — Z8619 Personal history of other infectious and parasitic diseases: Secondary | ICD-10-CM | POA: Insufficient documentation

## 2013-05-17 DIAGNOSIS — B029 Zoster without complications: Secondary | ICD-10-CM

## 2013-05-17 NOTE — Patient Instructions (Signed)
Preventive Care for Adults, Female A healthy lifestyle and preventive care can promote health and wellness. Preventive health guidelines for women include the following key practices.  A routine yearly physical is a good way to check with your health care provider about your health and preventive screening. It is a chance to share any concerns and updates on your health and to receive a thorough exam.  Visit your dentist for a routine exam and preventive care every 6 months. Brush your teeth twice a day and floss once a day. Good oral hygiene prevents tooth decay and gum disease.  The frequency of eye exams is based on your age, health, family medical history, use of contact lenses, and other factors. Follow your health care provider's recommendations for frequency of eye exams.  Eat a healthy diet. Foods like vegetables, fruits, whole grains, low-fat dairy products, and lean protein foods contain the nutrients you need without too many calories. Decrease your intake of foods high in solid fats, added sugars, and salt. Eat the right amount of calories for you.Get information about a proper diet from your health care provider, if necessary.  Regular physical exercise is one of the most important things you can do for your health. Most adults should get at least 150 minutes of moderate-intensity exercise (any activity that increases your heart rate and causes you to sweat) each week. In addition, most adults need muscle-strengthening exercises on 2 or more days a week.  Maintain a healthy weight. The body mass index (BMI) is a screening tool to identify possible weight problems. It provides an estimate of body fat based on height and weight. Your health care provider can find your BMI, and can help you achieve or maintain a healthy weight.For adults 20 years and older:  A BMI below 18.5 is considered underweight.  A BMI of 18.5 to 24.9 is normal.  A BMI of 25 to 29.9 is considered overweight.  A  BMI of 30 and above is considered obese.  Maintain normal blood lipids and cholesterol levels by exercising and minimizing your intake of saturated fat. Eat a balanced diet with plenty of fruit and vegetables. Blood tests for lipids and cholesterol should begin at age 62 and be repeated every 5 years. If your lipid or cholesterol levels are high, you are over 50, or you are at high risk for heart disease, you may need your cholesterol levels checked more frequently.Ongoing high lipid and cholesterol levels should be treated with medicines if diet and exercise are not working.  If you smoke, find out from your health care provider how to quit. If you do not use tobacco, do not start.  Lung cancer screening is recommended for adults aged 36 80 years who are at high risk for developing lung cancer because of a history of smoking. A yearly low-dose CT scan of the lungs is recommended for people who have at least a 30-pack-year history of smoking and are a current smoker or have quit within the past 15 years. A pack year of smoking is smoking an average of 1 pack of cigarettes a day for 1 year (for example: 1 pack a day for 30 years or 2 packs a day for 15 years). Yearly screening should continue until the smoker has stopped smoking for at least 15 years. Yearly screening should be stopped for people who develop a health problem that would prevent them from having lung cancer treatment.  If you are pregnant, do not drink alcohol. If you  are breastfeeding, be very cautious about drinking alcohol. If you are not pregnant and choose to drink alcohol, do not have more than 1 drink per day. One drink is considered to be 12 ounces (355 mL) of beer, 5 ounces (148 mL) of wine, or 1.5 ounces (44 mL) of liquor.  Avoid use of street drugs. Do not share needles with anyone. Ask for help if you need support or instructions about stopping the use of drugs.  High blood pressure causes heart disease and increases the risk  of stroke. Your blood pressure should be checked at least every 1 to 2 years. Ongoing high blood pressure should be treated with medicines if weight loss and exercise do not work.  If you are 47 60 years old, ask your health care provider if you should take aspirin to prevent strokes.  Diabetes screening involves taking a blood sample to check your fasting blood sugar level. This should be done once every 3 years, after age 15, if you are within normal weight and without risk factors for diabetes. Testing should be considered at a younger age or be carried out more frequently if you are overweight and have at least 1 risk factor for diabetes.  Breast cancer screening is essential preventive care for women. You should practice "breast self-awareness." This means understanding the normal appearance and feel of your breasts and may include breast self-examination. Any changes detected, no matter how small, should be reported to a health care provider. Women in their 11s and 30s should have a clinical breast exam (CBE) by a health care provider as part of a regular health exam every 1 to 3 years. After age 13, women should have a CBE every year. Starting at age 76, women should consider having a mammogram (breast X-ray test) every year. Women who have a family history of breast cancer should talk to their health care provider about genetic screening. Women at a high risk of breast cancer should talk to their health care providers about having an MRI and a mammogram every year.  Breast cancer gene (BRCA)-related cancer risk assessment is recommended for women who have family members with BRCA-related cancers. BRCA-related cancers include breast, ovarian, tubal, and peritoneal cancers. Having family members with these cancers may be associated with an increased risk for harmful changes (mutations) in the breast cancer genes BRCA1 and BRCA2. Results of the assessment will determine the need for genetic counseling  and BRCA1 and BRCA2 testing.  The Pap test is a screening test for cervical cancer. A Pap test can show cell changes on the cervix that might become cervical cancer if left untreated. A Pap test is a procedure in which cells are obtained and examined from the lower end of the uterus (cervix).  Women should have a Pap test starting at age 69.  Between ages 92 and 46, Pap tests should be repeated every 2 years.  Beginning at age 52, you should have a Pap test every 3 years as long as the past 3 Pap tests have been normal.  Some women have medical problems that increase the chance of getting cervical cancer. Talk to your health care provider about these problems. It is especially important to talk to your health care provider if a new problem develops soon after your last Pap test. In these cases, your health care provider may recommend more frequent screening and Pap tests.  The above recommendations are the same for women who have or have not gotten the vaccine  for human papillomavirus (HPV).  If you had a hysterectomy for a problem that was not cancer or a condition that could lead to cancer, then you no longer need Pap tests. Even if you no longer need a Pap test, a regular exam is a good idea to make sure no other problems are starting.  If you are between ages 62 and 21 years, and you have had normal Pap tests going back 10 years, you no longer need Pap tests. Even if you no longer need a Pap test, a regular exam is a good idea to make sure no other problems are starting.  If you have had past treatment for cervical cancer or a condition that could lead to cancer, you need Pap tests and screening for cancer for at least 20 years after your treatment.  If Pap tests have been discontinued, risk factors (such as a new sexual partner) need to be reassessed to determine if screening should be resumed.  The HPV test is an additional test that may be used for cervical cancer screening. The HPV test  looks for the virus that can cause the cell changes on the cervix. The cells collected during the Pap test can be tested for HPV. The HPV test could be used to screen women aged 60 years and older, and should be used in women of any age who have unclear Pap test results. After the age of 69, women should have HPV testing at the same frequency as a Pap test.  Colorectal cancer can be detected and often prevented. Most routine colorectal cancer screening begins at the age of 41 years and continues through age 69 years. However, your health care provider may recommend screening at an earlier age if you have risk factors for colon cancer. On a yearly basis, your health care provider may provide home test kits to check for hidden blood in the stool. Use of a small camera at the end of a tube, to directly examine the colon (sigmoidoscopy or colonoscopy), can detect the earliest forms of colorectal cancer. Talk to your health care provider about this at age 52, when routine screening begins. Direct exam of the colon should be repeated every 5 10 years through age 9 years, unless early forms of pre-cancerous polyps or small growths are found.  People who are at an increased risk for hepatitis B should be screened for this virus. You are considered at high risk for hepatitis B if:  You were born in a country where hepatitis B occurs often. Talk with your health care provider about which countries are considered high risk.  Your parents were born in a high-risk country and you have not received a shot to protect against hepatitis B (hepatitis B vaccine).  You have HIV or AIDS.  You use needles to inject street drugs.  You live with, or have sex with, someone who has Hepatitis B.  You get hemodialysis treatment.  You take certain medicines for conditions like cancer, organ transplantation, and autoimmune conditions.  Hepatitis C blood testing is recommended for all people born from [birth year] through 1965 and  any individual with known risks for hepatitis C.  Practice safe sex. Use condoms and avoid high-risk sexual practices to reduce the spread of sexually transmitted infections (STIs). STIs include gonorrhea, chlamydia, syphilis, trichomonas, herpes, HPV, and human immunodeficiency virus (HIV). Herpes, HIV, and HPV are viral illnesses that have no cure. They can result in disability, cancer, and death. Sexually active women aged 71  years and younger should be checked for chlamydia. Older women with new or multiple partners should also be tested for chlamydia. Testing for other STIs is recommended if you are sexually active and at increased risk.  Osteoporosis is a disease in which the bones lose minerals and strength with aging. This can result in serious bone fractures or breaks. The risk of osteoporosis can be identified using a bone density scan. Women ages 18 years and over and women at risk for fractures or osteoporosis should discuss screening with their health care providers. Ask your health care provider whether you should take a calcium supplement or vitamin D to reduce the rate of osteoporosis.  Menopause can be associated with physical symptoms and risks. Hormone replacement therapy is available to decrease symptoms and risks. You should talk to your health care provider about whether hormone replacement therapy is right for you.  Use sunscreen. Apply sunscreen liberally and repeatedly throughout the day. You should seek shade when your shadow is shorter than you. Protect yourself by wearing long sleeves, pants, a wide-brimmed hat, and sunglasses year round, whenever you are outdoors.  Once a month, do a whole body skin exam, using a mirror to look at the skin on your back. Tell your health care provider of new moles, moles that have irregular borders, moles that are larger than a pencil eraser, or moles that have changed in shape or color.  Stay current with required vaccines  (immunizations).  Influenza vaccine. All adults should be immunized every year.  Tetanus, diphtheria, and acellular pertussis (Td, Tdap) vaccine. Pregnant women should receive 1 dose of Tdap vaccine during each pregnancy. The dose should be obtained regardless of the length of time since the last dose. Immunization is preferred during the 27th 36th week of gestation. An adult who has not previously received Tdap or who does not know her vaccine status should receive 1 dose of Tdap. This initial dose should be followed by tetanus and diphtheria toxoids (Td) booster doses every 10 years. Adults with an unknown or incomplete history of completing a 3-dose immunization series with Td-containing vaccines should begin or complete a primary immunization series including a Tdap dose. Adults should receive a Td booster every 10 years.  Varicella vaccine. An adult without evidence of immunity to varicella should receive 2 doses or a second dose if she has previously received 1 dose. Pregnant females who do not have evidence of immunity should receive the first dose after pregnancy. This first dose should be obtained before leaving the health care facility. The second dose should be obtained 4 8 weeks after the first dose.  Human papillomavirus (HPV) vaccine. Females aged 9 26 years who have not received the vaccine previously should obtain the 3-dose series. The vaccine is not recommended for use in pregnant females. However, pregnancy testing is not needed before receiving a dose. If a female is found to be pregnant after receiving a dose, no treatment is needed. In that case, the remaining doses should be delayed until after the pregnancy. Immunization is recommended for any person with an immunocompromised condition through the age of 51 years if she did not get any or all doses earlier. During the 3-dose series, the second dose should be obtained 4 8 weeks after the first dose. The third dose should be obtained  24 weeks after the first dose and 16 weeks after the second dose.  Zoster vaccine. One dose is recommended for adults aged 57 years or older unless certain  conditions are present.  Measles, mumps, and rubella (MMR) vaccine. Adults born before [birth year] generally are considered immune to measles and mumps. Adults born in [birth year] or later should have 1 or more doses of MMR vaccine unless there is a contraindication to the vaccine or there is laboratory evidence of immunity to each of the three diseases. A routine second dose of MMR vaccine should be obtained at least 28 days after the first dose for students attending postsecondary schools, health care workers, or international travelers. People who received inactivated measles vaccine or an unknown type of measles vaccine during 1963 1967 should receive 2 doses of MMR vaccine. People who received inactivated mumps vaccine or an unknown type of mumps vaccine before 1979 and are at high risk for mumps infection should consider immunization with 2 doses of MMR vaccine. For females of childbearing age, rubella immunity should be determined. If there is no evidence of immunity, females who are not pregnant should be vaccinated. If there is no evidence of immunity, females who are pregnant should delay immunization until after pregnancy. Unvaccinated health care workers born before [birth year] who lack laboratory evidence of measles, mumps, or rubella immunity or laboratory confirmation of disease should consider measles and mumps immunization with 2 doses of MMR vaccine or rubella immunization with 1 dose of MMR vaccine.  Pneumococcal 13-valent conjugate (PCV13) vaccine. When indicated, a person who is uncertain of her immunization history and has no record of immunization should receive the PCV13 vaccine. An adult aged 42 years or older who has certain medical conditions and has not been previously immunized should receive 1 dose of PCV13 vaccine. This PCV13 should be followed  with a dose of pneumococcal polysaccharide (PPSV23) vaccine. The PPSV23 vaccine dose should be obtained at least 8 weeks after the dose of PCV13 vaccine. An adult aged 4 years or older who has certain medical conditions and previously received 1 or more doses of PPSV23 vaccine should receive 1 dose of PCV13. The PCV13 vaccine dose should be obtained 1 or more years after the last PPSV23 vaccine dose.  Pneumococcal polysaccharide (PPSV23) vaccine. When PCV13 is also indicated, PCV13 should be obtained first. All adults aged 27 years and older should be immunized. An adult younger than age 33 years who has certain medical conditions should be immunized. Any person who resides in a nursing home or long-term care facility should be immunized. An adult smoker should be immunized. People with an immunocompromised condition and certain other conditions should receive both PCV13 and PPSV23 vaccines. People with human immunodeficiency virus (HIV) infection should be immunized as soon as possible after diagnosis. Immunization during chemotherapy or radiation therapy should be avoided. Routine use of PPSV23 vaccine is not recommended for American Indians, [place] Natives, or people younger than 65 years unless there are medical conditions that require PPSV23 vaccine. When indicated, people who have unknown immunization and have no record of immunization should receive PPSV23 vaccine. One-time revaccination 5 years after the first dose of PPSV23 is recommended for people aged 13 64 years who have chronic kidney failure, nephrotic syndrome, asplenia, or immunocompromised conditions. People who received 1 2 doses of PPSV23 before age 66 years should receive another dose of PPSV23 vaccine at age 27 years or later if at least 5 years have passed since the previous dose. Doses of PPSV23 are not needed for people immunized with PPSV23 at or after age 33 years.  Meningococcal vaccine. Adults with asplenia or persistent complement  component deficiencies should receive 2  doses of quadrivalent meningococcal conjugate (MenACWY-D) vaccine. The doses should be obtained at least 2 months apart. Microbiologists working with certain meningococcal bacteria, [place] recruits, people at risk during an outbreak, and people who travel to or live in countries with a high rate of meningitis should be immunized. A first-year college student up through age 49 years who is living in a residence hall should receive a dose if she did not receive a dose on or after her 16th birthday. Adults who have certain high-risk conditions should receive one or more doses of vaccine.  Hepatitis A vaccine. Adults who wish to be protected from this disease, have certain high-risk conditions, work with hepatitis A-infected animals, work in hepatitis A research labs, or travel to or work in countries with a high rate of hepatitis A should be immunized. Adults who were previously unvaccinated and who anticipate close contact with an international adoptee during the first 60 days after arrival in the [place] from a country with a high rate of hepatitis A should be immunized.  Hepatitis B vaccine. Adults who wish to be protected from this disease, have certain high-risk conditions, may be exposed to blood or other infectious body fluids, are household contacts or sex partners of hepatitis B positive people, are clients or workers in certain care facilities, or travel to or work in countries with a high rate of hepatitis B should be immunized.  Haemophilus influenzae type b (Hib) vaccine. A previously unvaccinated person with asplenia or sickle cell disease or having a scheduled splenectomy should receive 1 dose of Hib vaccine. Regardless of previous immunization, a recipient of a hematopoietic stem cell transplant should receive a 3-dose series 6 12 months after her successful transplant. Hib vaccine is not recommended for adults with HIV infection. Preventive  Services / Frequency Ages 24 to 39years  Blood pressure check.** / Every 1 to 2 years.  Lipid and cholesterol check.** / Every 5 years beginning at age 66.  Clinical breast exam.** / Every 3 years for women in their 12s and 24s.  BRCA-related cancer risk assessment.** / For women who have family members with a BRCA-related cancer (breast, ovarian, tubal, or peritoneal cancers).  Pap test.** / Every 2 years from ages 31 through 69. Every 3 years starting at age 64 through age 76 or 89 with a history of 3 consecutive normal Pap tests.  HPV screening.** / Every 3 years from ages 10 through ages 10 to 96 with a history of 3 consecutive normal Pap tests.  Hepatitis C blood test.** / For any individual with known risks for hepatitis C.  Skin self-exam. / Monthly.  Influenza vaccine. / Every year.  Tetanus, diphtheria, and acellular pertussis (Tdap, Td) vaccine.** / Consult your health care provider. Pregnant women should receive 1 dose of Tdap vaccine during each pregnancy. 1 dose of Td every 10 years.  Varicella vaccine.** / Consult your health care provider. Pregnant females who do not have evidence of immunity should receive the first dose after pregnancy.  HPV vaccine. / 3 doses over 6 months, if 90 and younger. The vaccine is not recommended for use in pregnant females. However, pregnancy testing is not needed before receiving a dose.  Measles, mumps, rubella (MMR) vaccine.** / You need at least 1 dose of MMR if you were born in 1957 or later. You may also need a 2nd dose. For females of childbearing age, rubella immunity should be determined. If there is no evidence of immunity, females who are not  pregnant should be vaccinated. If there is no evidence of immunity, females who are pregnant should delay immunization until after pregnancy.  Pneumococcal 13-valent conjugate (PCV13) vaccine.** / Consult your health care provider.  Pneumococcal polysaccharide (PPSV23) vaccine.** / 1 to 2  doses if you smoke cigarettes or if you have certain conditions.  Meningococcal vaccine.** / 1 dose if you are age 88 to 6 years and a Market researcher living in a residence hall, or have one of several medical conditions, you need to get vaccinated against meningococcal disease. You may also need additional booster doses.  Hepatitis A vaccine.** / Consult your health care provider.  Hepatitis B vaccine.** / Consult your health care provider.  Haemophilus influenzae type b (Hib) vaccine.** / Consult your health care provider. Ages 23 to 64years  Blood pressure check.** / Every 1 to 2 years.  Lipid and cholesterol check.** / Every 5 years beginning at age 20 years.  Lung cancer screening. / Every year if you are aged 51 80 years and have a 30-pack-year history of smoking and currently smoke or have quit within the past 15 years. Yearly screening is stopped once you have quit smoking for at least 15 years or develop a health problem that would prevent you from having lung cancer treatment.  Clinical breast exam.** / Every year after age 8 years.  BRCA-related cancer risk assessment.** / For women who have family members with a BRCA-related cancer (breast, ovarian, tubal, or peritoneal cancers).  Mammogram.** / Every year beginning at age 10 years and continuing for as long as you are in good health. Consult with your health care provider.  Pap test.** / Every 3 years starting at age 30 years through age 5 or 61 years with a history of 3 consecutive normal Pap tests.  HPV screening.** / Every 3 years from ages 39 years through ages 72 to 19 years with a history of 3 consecutive normal Pap tests.  Fecal occult blood test (FOBT) of stool. / Every year beginning at age 59 years and continuing until age 27 years. You may not need to do this test if you get a colonoscopy every 10 years.  Flexible sigmoidoscopy or colonoscopy.** / Every 5 years for a flexible sigmoidoscopy or every  10 years for a colonoscopy beginning at age 110 years and continuing until age 63 years.  Hepatitis C blood test.** / For all people born from [birth year] through 1967 and any individual with known risks for hepatitis C.  Skin self-exam. / Monthly.  Influenza vaccine. / Every year.  Tetanus, diphtheria, and acellular pertussis (Tdap/Td) vaccine.** / Consult your health care provider. Pregnant women should receive 1 dose of Tdap vaccine during each pregnancy. 1 dose of Td every 10 years.  Varicella vaccine.** / Consult your health care provider. Pregnant females who do not have evidence of immunity should receive the first dose after pregnancy.  Zoster vaccine.** / 1 dose for adults aged 46 years or older.  Measles, mumps, rubella (MMR) vaccine.** / You need at least 1 dose of MMR if you were born in 1959 or later. You may also need a 2nd dose. For females of childbearing age, rubella immunity should be determined. If there is no evidence of immunity, females who are not pregnant should be vaccinated. If there is no evidence of immunity, females who are pregnant should delay immunization until after pregnancy.  Pneumococcal 13-valent conjugate (PCV13) vaccine.** / Consult your health care provider.  Pneumococcal polysaccharide (PPSV23) vaccine.** / 1  to 2 doses if you smoke cigarettes or if you have certain conditions.  Meningococcal vaccine.** / Consult your health care provider.  Hepatitis A vaccine.** / Consult your health care provider.  Hepatitis B vaccine.** / Consult your health care provider.  Haemophilus influenzae type b (Hib) vaccine.** / Consult your health care provider. Ages 42 years and over  Blood pressure check.** / Every 1 to 2 years.  Lipid and cholesterol check.** / Every 5 years beginning at age 87 years.  Lung cancer screening. / Every year if you are aged 20 80 years and have a 30-pack-year history of smoking and currently smoke or have quit within the past 15 years.  Yearly screening is stopped once you have quit smoking for at least 15 years or develop a health problem that would prevent you from having lung cancer treatment.  Clinical breast exam.** / Every year after age 36 years.  BRCA-related cancer risk assessment.** / For women who have family members with a BRCA-related cancer (breast, ovarian, tubal, or peritoneal cancers).  Mammogram.** / Every year beginning at age 42 years and continuing for as long as you are in good health. Consult with your health care provider.  Pap test.** / Every 3 years starting at age 33 years through age 37 or 13 years with 3 consecutive normal Pap tests. Testing can be stopped between 65 and 70 years with 3 consecutive normal Pap tests and no abnormal Pap or HPV tests in the past 10 years.  HPV screening.** / Every 3 years from ages 93 years through ages 80 or 5 years with a history of 3 consecutive normal Pap tests. Testing can be stopped between 65 and 70 years with 3 consecutive normal Pap tests and no abnormal Pap or HPV tests in the past 10 years.  Fecal occult blood test (FOBT) of stool. / Every year beginning at age 89 years and continuing until age 40 years. You may not need to do this test if you get a colonoscopy every 10 years.  Flexible sigmoidoscopy or colonoscopy.** / Every 5 years for a flexible sigmoidoscopy or every 10 years for a colonoscopy beginning at age 38 years and continuing until age 14 years.  Hepatitis C blood test.** / For all people born from [birth year] through 1967 and any individual with known risks for hepatitis C.  Osteoporosis screening.** / A one-time screening for women ages 19 years and over and women at risk for fractures or osteoporosis.  Skin self-exam. / Monthly.  Influenza vaccine. / Every year.  Tetanus, diphtheria, and acellular pertussis (Tdap/Td) vaccine.** / 1 dose of Td every 10 years.  Varicella vaccine.** / Consult your health care provider.  Zoster vaccine.** / 1  dose for adults aged 60 years or older.  Pneumococcal 13-valent conjugate (PCV13) vaccine.** / Consult your health care provider.  Pneumococcal polysaccharide (PPSV23) vaccine.** / 1 dose for all adults aged 69 years and older.  Meningococcal vaccine.** / Consult your health care provider.  Hepatitis A vaccine.** / Consult your health care provider.  Hepatitis B vaccine.** / Consult your health care provider.  Haemophilus influenzae type b (Hib) vaccine.** / Consult your health care provider. ** Family history and personal history of risk and conditions may change your health care provider's recommendations. Document Released: 02/25/2001 Document Revised: 10/20/2012 Document Reviewed: 05/27/2010 [facility] Patient Information 2014 [place], [place].

## 2013-05-17 NOTE — Progress Notes (Signed)
Pre visit review using our clinic review tool, if applicable. No additional management support is needed unless otherwise documented below in the visit note. 

## 2013-05-18 ENCOUNTER — Telehealth: Payer: Self-pay | Admitting: Family Medicine

## 2013-05-18 NOTE — Telephone Encounter (Signed)
Patient called in stating that she forgot the name of the probiotic that Dr. Charlett Blake recommends she use? She is going out of town in the morning and is hoping to know before then.

## 2013-05-18 NOTE — Telephone Encounter (Signed)
So the name is Digestive Advantage

## 2013-05-18 NOTE — Telephone Encounter (Signed)
Left message for patient to return my call.

## 2013-05-19 NOTE — Telephone Encounter (Signed)
Left message for patient to return my call.

## 2013-05-22 ENCOUNTER — Encounter: Payer: Self-pay | Admitting: Family Medicine

## 2013-05-22 DIAGNOSIS — E559 Vitamin D deficiency, unspecified: Secondary | ICD-10-CM

## 2013-05-22 DIAGNOSIS — K219 Gastro-esophageal reflux disease without esophagitis: Secondary | ICD-10-CM

## 2013-05-22 DIAGNOSIS — N952 Postmenopausal atrophic vaginitis: Secondary | ICD-10-CM

## 2013-05-22 DIAGNOSIS — K449 Diaphragmatic hernia without obstruction or gangrene: Secondary | ICD-10-CM

## 2013-05-22 HISTORY — DX: Postmenopausal atrophic vaginitis: N95.2

## 2013-05-22 HISTORY — DX: Vitamin D deficiency, unspecified: E55.9

## 2013-05-22 HISTORY — DX: Diaphragmatic hernia without obstruction or gangrene: K21.9

## 2013-05-22 HISTORY — DX: Diaphragmatic hernia without obstruction or gangrene: K44.9

## 2013-05-22 NOTE — Assessment & Plan Note (Signed)
Consider premarin cream prn

## 2013-05-22 NOTE — Assessment & Plan Note (Signed)
Avoid offending foods, start probiotics. Do not eat large meals in late evening and consider raising head of bed. Continue Omeprazole 

## 2013-05-22 NOTE — Progress Notes (Signed)
Patient ID: Lauren [name], female   DOB: Nov 16, 1955, 58 y.o.   MRN: [id number] BANNIE LOBBAN [id number] 1955-08-29 05/22/2013      Progress Note-Follow Up  Subjective  Chief Complaint  Chief Complaint  Patient presents with  . Establish Care    new patient    HPI  Patient is a 58 year old female in today for routine medical care. She establish care. She's in good health but she has struggled with esophageal cancer in the past. It is well controlled and she offers no acute complaints today. She reports a history of silent reflux. Had her endoscopy last month. Denies CP/palp/SOB/HA/congestion/fevers/GI or GU c/o. Taking meds as prescribed  Past Medical History  Diagnosis Date  . Chicken pox as a child  . Shingles 34 yrs old  . Measles as a child  . 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 Depression     been on antidepressants on and off for 20 yr  . GERD (gastroesophageal reflux disease)     silent with hiatal hernia  . Hiatal hernia 05/22/2013  . Postmenopausal atrophic vaginitis 05/22/2013  . Hiatal hernia with gastroesophageal reflux 05/22/2013  . Unspecified vitamin D deficiency 05/22/2013    Past Surgical History  Procedure Laterality Date  . Abdominal hysterectomy  1998    partial-still has ovaries  . Esophagectomy  05-2008  . Induced abortion  1979    Family History  Problem Relation Age of Onset  . Cancer Mother 58    breast cancer  . Emphysema Mother     smoker  . Heart attack Father     X several  . Mental illness Brother     bipolar, xanax and thc abuse  . Obesity Brother   . Depression Daughter   . Anxiety disorder Daughter   . Other Maternal Grandmother     blood clot, dvt, lung  . Cancer Paternal Grandmother     stomach  . Heart disease Paternal Grandfather     History   Social History  . Marital Status: Married    Spouse Name: [name]    Number of Children: N/A  . Years of Education: N/A   Occupational History  .      Event Planner    Social History Main Topics  . Smoking status: Former Smoker -- 0.10 packs/day for 25 years    Types: Cigarettes    Start date: 01/13/1998  . Smokeless tobacco: Never Used     Comment: 1-2 cigarettes a day  . Alcohol Use: Yes     Comment: a couple glasses of wine every other day  . Drug Use: No  . Sexual Activity: Yes    Partners: Male     Comment: lives with husband, event planner, no dietary restrictions   Other Topics Concern  . 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 Multiple Vitamins-Minerals (MULTIVITAL PO) Take by mouth daily. liquid      . venlafaxine XR (EFFEXOR-XR) 75 MG 24 hr capsule Take 75 mg by mouth daily.       No current facility-administered medications on file prior to visit.    Allergies  Allergen Reactions  . Penicillins Hives    Review of Systems  Review of Systems  Constitutional: Negative for fever, chills and malaise/fatigue.  HENT: Negative for congestion, hearing  loss and nosebleeds.   Eyes: Negative for discharge.  Respiratory: Negative for cough, sputum production, shortness of breath and wheezing.   Cardiovascular: Negative for chest pain, palpitations and leg swelling.  Gastrointestinal: Positive for heartburn. Negative for nausea, vomiting, abdominal pain, diarrhea, constipation and blood in stool.  Genitourinary: Negative for dysuria, urgency, frequency and hematuria.  Musculoskeletal: Negative for back pain, falls and myalgias.  Skin: Negative for rash.  Neurological: Negative for dizziness, tremors, sensory change, focal weakness, loss of consciousness, weakness and headaches.  Endo/Heme/Allergies: Negative for polydipsia. Does not bruise/bleed easily.  Psychiatric/Behavioral: Negative for depression and suicidal ideas. The patient is not nervous/anxious and does not have insomnia.      Objective  BP 104/64  Pulse 86  Temp(Src) 98.4 F (36.9 C) (Oral)  Ht 5\' 4"  (1.626 m)  Wt 117 lb 0.6 oz (53.089 kg)  BMI 20.08 kg/m2  SpO2 96%  Physical Exam  Physical Exam  Constitutional: She is oriented to person, place, and time and well-developed, well-nourished, and in no distress. No distress.  HENT:  Head: Normocephalic and atraumatic.  Right Ear: External ear normal.  Left Ear: External ear normal.  Nose: Nose normal.  Mouth/Throat: Oropharynx is clear and moist. No oropharyngeal exudate.  Eyes: Conjunctivae are normal. Pupils are equal, round, and reactive to light. Right eye exhibits no discharge. Left eye exhibits no discharge. No scleral icterus.  Neck: Normal range of motion. Neck supple. No thyromegaly present.  Cardiovascular: Normal rate, regular rhythm, normal heart sounds and intact distal pulses.   No murmur heard. Pulmonary/Chest: Effort normal and breath sounds normal. No respiratory distress. She has no wheezes. She has no rales.  Abdominal: Soft. Bowel sounds are normal. She exhibits no distension and no mass. There is no tenderness.  Musculoskeletal: Normal range of motion. She exhibits no edema and no tenderness.  Lymphadenopathy:    She has no cervical adenopathy.  Neurological: She is alert and oriented to person, place, and time. She has normal reflexes. No cranial nerve deficit. Coordination normal.  Skin: Skin is warm and dry. No rash noted. She is not diaphoretic.  Psychiatric: Mood, memory and affect normal.    Lab Results  Component Value Date   TSH 2.036 01/19/2009   Lab Results  Component Value Date   WBC 4.2 01/19/2009   HGB 13.2 01/19/2009   HCT 39.1 01/19/2009   MCV 98.3 01/19/2009   PLT 281 01/19/2009   Lab Results  Component Value Date   CREATININE 0.60 05/03/2008   BUN 6 05/03/2008   NA 137 05/03/2008   K 4.6 05/03/2008   CL 102 05/03/2008   CO2 30 05/03/2008   Lab Results  Component Value Date   ALT 33 05/01/2008   AST 30  05/01/2008   ALKPHOS 90 05/01/2008   BILITOT 0.6 05/01/2008   Lab Results  Component Value Date   CHOL 200 01/19/2009   Lab Results  Component Value Date   HDL 76 01/19/2009   Lab Results  Component Value Date   LDLCALC 109* 01/19/2009   Lab Results  Component Value Date   TRIG 76 01/19/2009   Lab Results  Component Value Date   CHOLHDL 2.6 01/19/2009     Assessment & Plan  Hiatal hernia with gastroesophageal reflux Avoid offending foods, start probiotics. Do not eat large meals in late evening and consider raising head of bed. Continue Omeprazole  Cancer Doing well s/p treatment  Depression Stable on Effexor  Postmenopausal atrophic vaginitis Consider premarin  cream prn  Shingles Offered Zostavax declines for now. Did agree to Tdap

## 2013-05-22 NOTE — Assessment & Plan Note (Signed)
Stable on Effexor

## 2013-05-22 NOTE — Assessment & Plan Note (Signed)
Doing well s/p treatment

## 2013-05-22 NOTE — Assessment & Plan Note (Signed)
Offered Zostavax declines for now. Did agree to Tdap

## 2013-05-30 ENCOUNTER — Telehealth: Payer: Self-pay | Admitting: Family Medicine

## 2013-05-30 NOTE — Telephone Encounter (Signed)
Received medical records from [facility]

## 2013-06-09 ENCOUNTER — Telehealth: Payer: Self-pay | Admitting: *Deleted

## 2013-06-09 MED ORDER — ESOMEPRAZOLE MAGNESIUM 40 MG PO CPDR: 40.0000 mg | DELAYED_RELEASE_CAPSULE | Freq: Every day | ORAL | Status: DC

## 2013-06-09 NOTE — Telephone Encounter (Signed)
Patient requesting a Rx to be sent to [facility] on [address] for Nexium; pt did not specify dosage, instructions, etc & the medication is not in EMR on pt's active and/or historical med list/SLS Please Advise on refills/SLS

## 2013-06-09 NOTE — Telephone Encounter (Signed)
So she needs a PPI but I did not know which one she was taking. Would send in Nexium 40 mg daily, disp #30 with 5 rf or #90 with 1 but check with her first and make sure she is not taking the 20 mg dose or taking it twice a day etc

## 2013-06-10 ENCOUNTER — Telehealth: Payer: Self-pay | Admitting: Family Medicine

## 2013-06-10 NOTE — Telephone Encounter (Signed)
Received paperwork for PA of Nexium, form forward to nurse

## 2013-06-16 NOTE — Telephone Encounter (Signed)
Pa form faxed.

## 2013-06-17 NOTE — Telephone Encounter (Signed)
PA for Nexium 40 mg approved 06-16-13 through June 17 2014

## 2013-10-24 ENCOUNTER — Ambulatory Visit: Payer: BC Managed Care – PPO | Admitting: Family Medicine

## 2013-10-25 ENCOUNTER — Telehealth: Payer: Self-pay | Admitting: Family Medicine

## 2013-10-25 MED ORDER — VENLAFAXINE HCL ER 75 MG PO CP24: 75.0000 mg | ORAL_CAPSULE | Freq: Every day | ORAL | Status: DC

## 2013-10-25 NOTE — Telephone Encounter (Signed)
Pt is needing new rx for venlafaxine XR (EFFEXOR-XR) 75 MG 24 hr capsule Sent to [facility] on [address]

## 2013-10-25 NOTE — Telephone Encounter (Signed)
Med filled.  

## 2013-12-13 ENCOUNTER — Encounter: Payer: Self-pay | Admitting: Family Medicine

## 2013-12-13 ENCOUNTER — Ambulatory Visit (INDEPENDENT_AMBULATORY_CARE_PROVIDER_SITE_OTHER): Payer: BC Managed Care – PPO | Admitting: Family Medicine

## 2013-12-13 VITALS — BP 121/72 | HR 76 | Temp 98.5°F | Ht 64.0 in | Wt 120.0 lb

## 2013-12-13 DIAGNOSIS — L738 Other specified follicular disorders: Secondary | ICD-10-CM

## 2013-12-13 DIAGNOSIS — H60393 Other infective otitis externa, bilateral: Secondary | ICD-10-CM

## 2013-12-13 MED ORDER — VENLAFAXINE HCL ER 75 MG PO CP24: 75.0000 mg | ORAL_CAPSULE | Freq: Every day | ORAL | Status: DC

## 2013-12-13 MED ORDER — CIPROFLOXACIN HCL 500 MG PO TABS: 500.0000 mg | ORAL_TABLET | Freq: Two times a day (BID) | ORAL | Status: DC

## 2013-12-13 MED ORDER — NEOMYCIN-POLYMYXIN-HC 3.5-10000-1 OT SOLN: 3.0000 [drp] | Freq: Two times a day (BID) | OTIC | Status: DC

## 2013-12-13 MED ORDER — TRIAMCINOLONE ACETONIDE 0.1 % EX CREA: 1.0000 "application " | TOPICAL_CREAM | Freq: Two times a day (BID) | CUTANEOUS | Status: DC

## 2013-12-13 NOTE — Patient Instructions (Addendum)
Witch hazel Astringent and Sarna lotion for itching   Call if no improvement for Permethrin cream or referral dermatology  Probiotic daily such as digestive Advantage or [facility]'s [facility]  A daily antihistamine for next month or so. Zyrtec/Cetirizine or Claritin/Loaratadine  Folliculitis  Folliculitis is redness, soreness, and swelling (inflammation) of the hair follicles. This condition can occur anywhere on the body. People with weakened immune systems, diabetes, or obesity have a greater risk of getting folliculitis. CAUSES  Bacterial infection. This is the most common cause.  Fungal infection.  Viral infection.  Contact with certain chemicals, especially oils and tars. Long-term folliculitis can result from bacteria that live in the nostrils. The bacteria may trigger multiple outbreaks of folliculitis over time. SYMPTOMS Folliculitis most commonly occurs on the scalp, thighs, legs, back, buttocks, and areas where hair is shaved frequently. An early sign of folliculitis is a small, white or yellow, pus-filled, itchy lesion (pustule). These lesions appear on a red, inflamed follicle. They are usually less than 0.2 inches (5 mm) wide. When there is an infection of the follicle that goes deeper, it becomes a boil or furuncle. A group of closely packed boils creates a larger lesion (carbuncle). Carbuncles tend to occur in hairy, sweaty areas of the body. DIAGNOSIS  Your caregiver can usually tell what is wrong by doing a physical exam. A sample may be taken from one of the lesions and tested in a lab. This can help determine what is causing your folliculitis. TREATMENT  Treatment may include:  Applying warm compresses to the affected areas.  Taking antibiotic medicines orally or applying them to the skin.  Draining the lesions if they contain a large amount of pus or fluid.  Laser hair removal for cases of long-lasting folliculitis. This helps to prevent regrowth of the  hair. HOME CARE INSTRUCTIONS  Apply warm compresses to the affected areas as directed by your caregiver.  If antibiotics are prescribed, take them as directed. Finish them even if you start to feel better.  You may take over-the-counter medicines to relieve itching.  Do not shave irritated skin.  Follow up with your caregiver as directed. SEEK IMMEDIATE MEDICAL CARE IF:   You have increasing redness, swelling, or pain in the affected area.  You have a fever. MAKE SURE YOU:  Understand these instructions.  Will watch your condition.  Will get help right away if you are not doing well or get worse. Document Released: 03/10/2001 Document Revised: 07/01/2011 Document Reviewed: 04/01/2011 [facility] Patient Information 2015 [place], [place]. This information is not intended to replace advice given to you by your health care provider. Make sure you discuss any questions you have with your health care provider.

## 2013-12-13 NOTE — Progress Notes (Signed)
Pre visit review using our clinic review tool, if applicable. No additional management support is needed unless otherwise documented below in the visit note. 

## 2013-12-18 ENCOUNTER — Encounter: Payer: Self-pay | Admitting: Family Medicine

## 2013-12-18 DIAGNOSIS — L738 Other specified follicular disorders: Secondary | ICD-10-CM

## 2013-12-18 HISTORY — DX: Other specified follicular disorders: L73.8

## 2013-12-18 NOTE — Assessment & Plan Note (Signed)
Started on ciprofloxacin and bactroban, may use Triamcinolone sparingly for symptom control. Use Zyrtec and Zantac daily til rash resolves. Report if no improvement.

## 2013-12-18 NOTE — Progress Notes (Signed)
Lauren [name]  [id number] 1955/11/22 12/18/2013      Progress Note-Follow Up  Subjective  Chief Complaint  Chief Complaint  Patient presents with  . Rash    on R arm X 6 weeks- itchy- pain due to scratching    HPI  Patient is a 58 y.o. female in today for routine medical care. Patient is struggling with a pruritic rash on arms and legs for past 6 weeks her husband is also struggling with similar rash. They have recently gotten a new hot tub. No other complaints. Denies CP/palp/SOB/HA/congestion/fevers/GI or GU c/o. Taking meds as prescribed  Past Medical History  Diagnosis Date  . Chicken pox as a child  . Shingles 42 yrs old  . Measles as a child  . 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 Depression     been on antidepressants on and off for 20 yr  . GERD (gastroesophageal reflux disease)     silent with hiatal hernia  . Hiatal hernia 05/22/2013  . Postmenopausal atrophic vaginitis 05/22/2013  . Hiatal hernia with gastroesophageal reflux 05/22/2013  . Unspecified vitamin D deficiency 05/22/2013  . Hot tub folliculitis 08/19/5782    Past Surgical History  Procedure Laterality Date  . Abdominal hysterectomy  1998    partial-still has ovaries  . Esophagectomy  05-2008  . Induced abortion  1979    Family History  Problem Relation Age of Onset  . Cancer Mother 15    breast cancer  . Emphysema Mother     smoker  . Heart attack Father     X several  . Mental illness Brother     bipolar, xanax and thc abuse  . Obesity Brother   . Depression Daughter   . Anxiety disorder Daughter   . Other Maternal Grandmother     blood clot, dvt, lung  . Cancer Paternal Grandmother     stomach  . Heart disease Paternal Grandfather     History   Social History  . Marital Status: Married    Spouse Name: [name]    Number of Children: N/A  . 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 Smoking status: Former Smoker --  0.10 packs/day for 25 years    Types: Cigarettes    Start date: 01/13/1998  . Smokeless tobacco: Never Used     Comment: 1-2 cigarettes a day  . Alcohol Use: Yes     Comment: a couple glasses of wine every other day  . Drug Use: No  . Sexual Activity:    Partners: Male     Comment: lives with husband, event planner, no dietary restrictions   Other Topics Concern  . 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 esomeprazole (NEXIUM) 40 MG capsule Take 1 capsule (40 mg total) by mouth daily. 90 capsule 1  . Multiple Vitamins-Minerals (MULTIVITAL PO) Take by mouth daily. liquid     No current facility-administered medications on file prior to visit.    Allergies  Allergen Reactions  . Penicillins Hives    Review of Systems  Review of Systems  Constitutional: Negative for fever and malaise/fatigue.  HENT: Negative for congestion.   Eyes: Negative for discharge.  Respiratory: Negative for shortness of breath.  Cardiovascular: Negative for chest pain, palpitations and leg swelling.  Gastrointestinal: Negative for nausea, abdominal pain and diarrhea.  Genitourinary: Negative for dysuria.  Musculoskeletal: Negative for falls.  Skin: Positive for itching and rash.  Neurological: Negative for loss of consciousness and headaches.  Endo/Heme/Allergies: Negative for polydipsia.  Psychiatric/Behavioral: Negative for depression and suicidal ideas. The patient is not nervous/anxious and does not have insomnia.     Objective  BP 121/72 mmHg  Pulse 76  Temp(Src) 98.5 F (36.9 C) (Oral)  Ht 5\' 4"  (1.626 m)  Wt 120 lb (54.432 kg)  BMI 20.59 kg/m2  SpO2 100%  Physical Exam  Physical Exam  Constitutional: She is oriented to person, place, and time and well-developed, well-nourished, and in no distress. No distress.  HENT:  Head: Normocephalic and  atraumatic.  Eyes: Conjunctivae are normal.  Neck: Neck supple. No thyromegaly present.  Cardiovascular: Normal rate, regular rhythm and normal heart sounds.   No murmur heard. Pulmonary/Chest: Effort normal and breath sounds normal. She has no wheezes.  Abdominal: She exhibits no distension and no mass.  Musculoskeletal: She exhibits no edema.  Lymphadenopathy:    She has no cervical adenopathy.  Neurological: She is alert and oriented to person, place, and time.  Skin: Skin is warm and dry. Rash noted. She is not diaphoretic.  Scattered, small maculopapular lesions on arms and legs.   Psychiatric: Memory, affect and judgment normal.    Lab Results  Component Value Date   TSH 2.036 01/19/2009   Lab Results  Component Value Date   WBC 4.2 01/19/2009   HGB 13.2 01/19/2009   HCT 39.1 01/19/2009   MCV 98.3 01/19/2009   PLT 281 01/19/2009   Lab Results  Component Value Date   CREATININE 0.60 05/03/2008   BUN 6 05/03/2008   NA 137 05/03/2008   K 4.6 05/03/2008   CL 102 05/03/2008   CO2 30 05/03/2008   Lab Results  Component Value Date   ALT 33 05/01/2008   AST 30 05/01/2008   ALKPHOS 90 05/01/2008   BILITOT 0.6 05/01/2008   Lab Results  Component Value Date   CHOL 200 01/19/2009   Lab Results  Component Value Date   HDL 76 01/19/2009   Lab Results  Component Value Date   LDLCALC 109* 01/19/2009   Lab Results  Component Value Date   TRIG 76 01/19/2009   Lab Results  Component Value Date   CHOLHDL 2.6 01/19/2009     Assessment & Plan  Hot tub folliculitis Started on ciprofloxacin and bactroban, may use Triamcinolone sparingly for symptom control. Use Zyrtec and Zantac daily til rash resolves. Report if no improvement.

## 2014-01-03 ENCOUNTER — Telehealth: Payer: Self-pay | Admitting: Family Medicine

## 2014-01-03 NOTE — Telephone Encounter (Signed)
She can try permethrin cream 5% for possible zoonosis. Apply head to toe qhs and wash off in 8 hours, in am. Disp #1 tube., 1 rf

## 2014-01-03 NOTE — Telephone Encounter (Signed)
Patient is not improving, states you were going to call her in something for the rash if she did not improve. [facility] on [address]. Please advise.

## 2014-01-04 MED ORDER — PERMETHRIN 5 % EX CREA: TOPICAL_CREAM | CUTANEOUS | Status: DC

## 2014-01-04 NOTE — Telephone Encounter (Signed)
Rx sent to pharmacy   

## 2014-01-04 NOTE — Telephone Encounter (Signed)
Pt is following up states her pharmacy does not have the rx yet

## 2014-02-24 ENCOUNTER — Ambulatory Visit: Payer: BC Managed Care – PPO | Admitting: Nurse Practitioner

## 2014-02-27 ENCOUNTER — Telehealth: Payer: Self-pay | Admitting: Nurse Practitioner

## 2014-02-27 ENCOUNTER — Telehealth: Payer: Self-pay | Admitting: *Deleted

## 2014-02-27 NOTE — Telephone Encounter (Signed)
Called pt and lm to advise scheduling would be calling to reschedule her appt missed on 2/12

## 2014-02-27 NOTE — Telephone Encounter (Signed)
, °

## 2014-08-11 ENCOUNTER — Telehealth: Payer: Self-pay | Admitting: Family Medicine

## 2014-08-11 NOTE — Telephone Encounter (Signed)
Caller name: Kaimana Relationship to patient: Self  Can be reached: [phone]  Pharmacy: [address]   Reason for call: omeprazole  40mg  1 per day Qty 90

## 2014-08-11 NOTE — Telephone Encounter (Signed)
Called patient to clarify- Omeprazole is not listed under her medications, rather Esomeprazole.  LMOVM to return call.

## 2014-08-14 ENCOUNTER — Telehealth: Payer: Self-pay | Admitting: Family Medicine

## 2014-08-14 MED ORDER — ESOMEPRAZOLE MAGNESIUM 40 MG PO CPDR: 40.0000 mg | DELAYED_RELEASE_CAPSULE | Freq: Every day | ORAL | Status: DC

## 2014-08-14 NOTE — Telephone Encounter (Signed)
Refilled Nexium as patient requested.  Called the patient informed refill sent in as requested.

## 2014-08-14 NOTE — Telephone Encounter (Signed)
It is fine with me if she takes either med once we clarify with patient

## 2014-08-14 NOTE — Telephone Encounter (Signed)
Relation to OY:WVXU  Call back number: [phone] Pharmacy: [id number], [place]. [phone] (Phone) [phone] (Fax)         Reason for call:  Patient requesting a refill esomeprazole (NEXIUM) 40 MG capsule

## 2014-08-15 NOTE — Telephone Encounter (Signed)
Called the patient left message to call back 

## 2014-08-15 NOTE — Telephone Encounter (Signed)
Patient called back and it is actually the nexium she wants and for a #90 day supply.  Will send in as she wanted

## 2014-08-15 NOTE — Telephone Encounter (Signed)
Refill done already

## 2015-01-05 ENCOUNTER — Telehealth: Payer: Self-pay | Admitting: Family Medicine

## 2015-01-05 ENCOUNTER — Other Ambulatory Visit: Payer: Self-pay | Admitting: Family Medicine

## 2015-01-05 MED ORDER — VENLAFAXINE HCL ER 75 MG PO CP24: 75.0000 mg | ORAL_CAPSULE | Freq: Every day | ORAL | Status: DC

## 2015-01-05 NOTE — Telephone Encounter (Signed)
Pt called to schedule appt (1/69/17 with Dr. Charlett Blake) and needing refill on effexor 75mg , taking 1/day, has 5 left. Please call at [phone] when RX ready for pick up.

## 2015-01-05 NOTE — Telephone Encounter (Signed)
Rx filled x 30 days.  It's been 1 year since last OV.

## 2015-01-05 NOTE — Telephone Encounter (Signed)
Pt left VM 01/05/15 11:48am to schedule appt and needing med refill. LM for pt to call and schedule and to hold for a team member.

## 2015-01-19 ENCOUNTER — Ambulatory Visit (INDEPENDENT_AMBULATORY_CARE_PROVIDER_SITE_OTHER): Payer: BLUE CROSS/BLUE SHIELD | Admitting: Family Medicine

## 2015-01-19 ENCOUNTER — Encounter: Payer: Self-pay | Admitting: Family Medicine

## 2015-01-19 VITALS — BP 139/67 | HR 74 | Temp 97.6°F | Ht 64.0 in | Wt 127.0 lb

## 2015-01-19 DIAGNOSIS — K449 Diaphragmatic hernia without obstruction or gangrene: Secondary | ICD-10-CM

## 2015-01-19 DIAGNOSIS — Z1239 Encounter for other screening for malignant neoplasm of breast: Secondary | ICD-10-CM

## 2015-01-19 DIAGNOSIS — Z0000 Encounter for general adult medical examination without abnormal findings: Secondary | ICD-10-CM

## 2015-01-19 DIAGNOSIS — K219 Gastro-esophageal reflux disease without esophagitis: Secondary | ICD-10-CM | POA: Diagnosis not present

## 2015-01-19 DIAGNOSIS — Z1211 Encounter for screening for malignant neoplasm of colon: Secondary | ICD-10-CM

## 2015-01-19 DIAGNOSIS — F32A Depression, unspecified: Secondary | ICD-10-CM

## 2015-01-19 DIAGNOSIS — E559 Vitamin D deficiency, unspecified: Secondary | ICD-10-CM | POA: Diagnosis not present

## 2015-01-19 DIAGNOSIS — F329 Major depressive disorder, single episode, unspecified: Secondary | ICD-10-CM

## 2015-01-19 HISTORY — DX: Encounter for general adult medical examination without abnormal findings: Z00.00

## 2015-01-19 MED ORDER — VENLAFAXINE HCL ER 75 MG PO CP24: 75.0000 mg | ORAL_CAPSULE | Freq: Every day | ORAL | Status: DC

## 2015-01-19 MED ORDER — ESOMEPRAZOLE MAGNESIUM 40 MG PO CPDR: 40.0000 mg | DELAYED_RELEASE_CAPSULE | Freq: Every day | ORAL | Status: DC

## 2015-01-19 NOTE — Progress Notes (Signed)
Pre visit review using our clinic review tool, if applicable. No additional management support is needed unless otherwise documented below in the visit note. 

## 2015-01-19 NOTE — Patient Instructions (Addendum)
Vitamin D 2000 IU daily [email or web address]  Call insurance and check if they will pay for Hep C antibody and HIV as per CDC recommendations for preventative purposes  Preventive Care for Adults, Female A healthy lifestyle and preventive care can promote health and wellness. Preventive health guidelines for women include the following key practices.  A routine yearly physical is a good way to check with your health care provider about your health and preventive screening. It is a chance to share any concerns and updates on your health and to receive a thorough exam.  Visit your dentist for a routine exam and preventive care every 6 months. Brush your teeth twice a day and floss once a day. Good oral hygiene prevents tooth decay and gum disease.  The frequency of eye exams is based on your age, health, family medical history, use of contact lenses, and other factors. Follow your health care provider's recommendations for frequency of eye exams.  Eat a healthy diet. Foods like vegetables, fruits, whole grains, low-fat dairy products, and lean protein foods contain the nutrients you need without too many calories. Decrease your intake of foods high in solid fats, added sugars, and salt. Eat the right amount of calories for you.Get information about a proper diet from your health care provider, if necessary.  Regular physical exercise is one of the most important things you can do for your health. Most adults should get at least 150 minutes of moderate-intensity exercise (any activity that increases your heart rate and causes you to sweat) each week. In addition, most adults need muscle-strengthening exercises on 2 or more days a week.  Maintain a healthy weight. The body mass index (BMI) is a screening tool to identify possible weight problems. It provides an estimate of body fat based on height and weight. Your health care provider can find your BMI and can help you achieve or maintain a  healthy weight.For adults 20 years and older:  A BMI below 18.5 is considered underweight.  A BMI of 18.5 to 24.9 is normal.  A BMI of 25 to 29.9 is considered overweight.  A BMI of 30 and above is considered obese.  Maintain normal blood lipids and cholesterol levels by exercising and minimizing your intake of saturated fat. Eat a balanced diet with plenty of fruit and vegetables. Blood tests for lipids and cholesterol should begin at age 24 and be repeated every 5 years. If your lipid or cholesterol levels are high, you are over 50, or you are at high risk for heart disease, you may need your cholesterol levels checked more frequently.Ongoing high lipid and cholesterol levels should be treated with medicines if diet and exercise are not working.  If you smoke, find out from your health care provider how to quit. If you do not use tobacco, do not start.  Lung cancer screening is recommended for adults aged 66-80 years who are at high risk for developing lung cancer because of a history of smoking. A yearly low-dose CT scan of the lungs is recommended for people who have at least a 30-pack-year history of smoking and are a current smoker or have quit within the past 15 years. A pack year of smoking is smoking an average of 1 pack of cigarettes a day for 1 year (for example: 1 pack a day for 30 years or 2 packs a day for 15 years). Yearly screening should continue until the smoker has stopped smoking for at least 15 years. Yearly screening  should be stopped for people who develop a health problem that would prevent them from having lung cancer treatment.  If you are pregnant, do not drink alcohol. If you are breastfeeding, be very cautious about drinking alcohol. If you are not pregnant and choose to drink alcohol, do not have more than 1 drink per day. One drink is considered to be 12 ounces (355 mL) of beer, 5 ounces (148 mL) of wine, or 1.5 ounces (44 mL) of liquor.  Avoid use of street drugs.  Do not share needles with anyone. Ask for help if you need support or instructions about stopping the use of drugs.  High blood pressure causes heart disease and increases the risk of stroke. Your blood pressure should be checked at least every 1 to 2 years. Ongoing high blood pressure should be treated with medicines if weight loss and exercise do not work.  If you are 30-35 years old, ask your health care provider if you should take aspirin to prevent strokes.  Diabetes screening is done by taking a blood sample to check your blood glucose level after you have not eaten for a certain period of time (fasting). If you are not overweight and you do not have risk factors for diabetes, you should be screened once every 3 years starting at age 79. If you are overweight or obese and you are 65-54 years of age, you should be screened for diabetes every year as part of your cardiovascular risk assessment.  Breast cancer screening is essential preventive care for women. You should practice "breast self-awareness." This means understanding the normal appearance and feel of your breasts and may include breast self-examination. Any changes detected, no matter how small, should be reported to a health care provider. Women in their 61s and 30s should have a clinical breast exam (CBE) by a health care provider as part of a regular health exam every 1 to 3 years. After age 8, women should have a CBE every year. Starting at age 73, women should consider having a mammogram (breast X-ray test) every year. Women who have a family history of breast cancer should talk to their health care provider about genetic screening. Women at a high risk of breast cancer should talk to their health care providers about having an MRI and a mammogram every year.  Breast cancer gene (BRCA)-related cancer risk assessment is recommended for women who have family members with BRCA-related cancers. BRCA-related cancers include breast, ovarian,  tubal, and peritoneal cancers. Having family members with these cancers may be associated with an increased risk for harmful changes (mutations) in the breast cancer genes BRCA1 and BRCA2. Results of the assessment will determine the need for genetic counseling and BRCA1 and BRCA2 testing.  Your health care provider may recommend that you be screened regularly for cancer of the pelvic organs (ovaries, uterus, and vagina). This screening involves a pelvic examination, including checking for microscopic changes to the surface of your cervix (Pap test). You may be encouraged to have this screening done every 3 years, beginning at age 29.  For women ages 63-65, health care providers may recommend pelvic exams and Pap testing every 3 years, or they may recommend the Pap and pelvic exam, combined with testing for human papilloma virus (HPV), every 5 years. Some types of HPV increase your risk of cervical cancer. Testing for HPV may also be done on women of any age with unclear Pap test results.  Other health care providers may not recommend any  screening for nonpregnant women who are considered low risk for pelvic cancer and who do not have symptoms. Ask your health care provider if a screening pelvic exam is right for you.  If you have had past treatment for cervical cancer or a condition that could lead to cancer, you need Pap tests and screening for cancer for at least 20 years after your treatment. If Pap tests have been discontinued, your risk factors (such as having a new sexual partner) need to be reassessed to determine if screening should resume. Some women have medical problems that increase the chance of getting cervical cancer. In these cases, your health care provider may recommend more frequent screening and Pap tests.  Colorectal cancer can be detected and often prevented. Most routine colorectal cancer screening begins at the age of 52 years and continues through age 15 years. However, your  health care provider may recommend screening at an earlier age if you have risk factors for colon cancer. On a yearly basis, your health care provider may provide home test kits to check for hidden blood in the stool. Use of a small camera at the end of a tube, to directly examine the colon (sigmoidoscopy or colonoscopy), can detect the earliest forms of colorectal cancer. Talk to your health care provider about this at age 67, when routine screening begins. Direct exam of the colon should be repeated every 5-10 years through age 51 years, unless early forms of precancerous polyps or small growths are found.  People who are at an increased risk for hepatitis B should be screened for this virus. You are considered at high risk for hepatitis B if:  You were born in a country where hepatitis B occurs often. Talk with your health care provider about which countries are considered high risk.  Your parents were born in a high-risk country and you have not received a shot to protect against hepatitis B (hepatitis B vaccine).  You have HIV or AIDS.  You use needles to inject street drugs.  You live with, or have sex with, someone who has hepatitis B.  You get hemodialysis treatment.  You take certain medicines for conditions like cancer, organ transplantation, and autoimmune conditions.  Hepatitis C blood testing is recommended for all people born from [birth year] through 1965 and any individual with known risks for hepatitis C.  Practice safe sex. Use condoms and avoid high-risk sexual practices to reduce the spread of sexually transmitted infections (STIs). STIs include gonorrhea, chlamydia, syphilis, trichomonas, herpes, HPV, and human immunodeficiency virus (HIV). Herpes, HIV, and HPV are viral illnesses that have no cure. They can result in disability, cancer, and death.  You should be screened for sexually transmitted illnesses (STIs) including gonorrhea and chlamydia if:  You are sexually active and  are younger than 24 years.  You are older than 24 years and your health care provider tells you that you are at risk for this type of infection.  Your sexual activity has changed since you were last screened and you are at an increased risk for chlamydia or gonorrhea. Ask your health care provider if you are at risk.  If you are at risk of being infected with HIV, it is recommended that you take a prescription medicine daily to prevent HIV infection. This is called preexposure prophylaxis (PrEP). You are considered at risk if:  You are sexually active and do not regularly use condoms or know the HIV status of your partner(s).  You take drugs by injection.  You are sexually active with a partner who has HIV.  Talk with your health care provider about whether you are at high risk of being infected with HIV. If you choose to begin PrEP, you should first be tested for HIV. You should then be tested every 3 months for as long as you are taking PrEP.  Osteoporosis is a disease in which the bones lose minerals and strength with aging. This can result in serious bone fractures or breaks. The risk of osteoporosis can be identified using a bone density scan. Women ages 4 years and over and women at risk for fractures or osteoporosis should discuss screening with their health care providers. Ask your health care provider whether you should take a calcium supplement or vitamin D to reduce the rate of osteoporosis.  Menopause can be associated with physical symptoms and risks. Hormone replacement therapy is available to decrease symptoms and risks. You should talk to your health care provider about whether hormone replacement therapy is right for you.  Use sunscreen. Apply sunscreen liberally and repeatedly throughout the day. You should seek shade when your shadow is shorter than you. Protect yourself by wearing long sleeves, pants, a wide-brimmed hat, and sunglasses year round, whenever you are  outdoors.  Once a month, do a whole body skin exam, using a mirror to look at the skin on your back. Tell your health care provider of new moles, moles that have irregular borders, moles that are larger than a pencil eraser, or moles that have changed in shape or color.  Stay current with required vaccines (immunizations).  Influenza vaccine. All adults should be immunized every year.  Tetanus, diphtheria, and acellular pertussis (Td, Tdap) vaccine. Pregnant women should receive 1 dose of Tdap vaccine during each pregnancy. The dose should be obtained regardless of the length of time since the last dose. Immunization is preferred during the 27th-36th week of gestation. An adult who has not previously received Tdap or who does not know her vaccine status should receive 1 dose of Tdap. This initial dose should be followed by tetanus and diphtheria toxoids (Td) booster doses every 10 years. Adults with an unknown or incomplete history of completing a 3-dose immunization series with Td-containing vaccines should begin or complete a primary immunization series including a Tdap dose. Adults should receive a Td booster every 10 years.  Varicella vaccine. An adult without evidence of immunity to varicella should receive 2 doses or a second dose if she has previously received 1 dose. Pregnant females who do not have evidence of immunity should receive the first dose after pregnancy. This first dose should be obtained before leaving the health care facility. The second dose should be obtained 4-8 weeks after the first dose.  Human papillomavirus (HPV) vaccine. Females aged 13-26 years who have not received the vaccine previously should obtain the 3-dose series. The vaccine is not recommended for use in pregnant females. However, pregnancy testing is not needed before receiving a dose. If a female is found to be pregnant after receiving a dose, no treatment is needed. In that case, the remaining doses should be  delayed until after the pregnancy. Immunization is recommended for any person with an immunocompromised condition through the age of 62 years if she did not get any or all doses earlier. During the 3-dose series, the second dose should be obtained 4-8 weeks after the first dose. The third dose should be obtained 24 weeks after the first dose and 16 weeks after the  second dose.  Zoster vaccine. One dose is recommended for adults aged 89 years or older unless certain conditions are present.  Measles, mumps, and rubella (MMR) vaccine. Adults born before [birth year] generally are considered immune to measles and mumps. Adults born in [birth year] or later should have 1 or more doses of MMR vaccine unless there is a contraindication to the vaccine or there is laboratory evidence of immunity to each of the three diseases. A routine second dose of MMR vaccine should be obtained at least 28 days after the first dose for students attending postsecondary schools, health care workers, or international travelers. People who received inactivated measles vaccine or an unknown type of measles vaccine during 1963-1967 should receive 2 doses of MMR vaccine. People who received inactivated mumps vaccine or an unknown type of mumps vaccine before 1979 and are at high risk for mumps infection should consider immunization with 2 doses of MMR vaccine. For females of childbearing age, rubella immunity should be determined. If there is no evidence of immunity, females who are not pregnant should be vaccinated. If there is no evidence of immunity, females who are pregnant should delay immunization until after pregnancy. Unvaccinated health care workers born before [birth year] who lack laboratory evidence of measles, mumps, or rubella immunity or laboratory confirmation of disease should consider measles and mumps immunization with 2 doses of MMR vaccine or rubella immunization with 1 dose of MMR vaccine.  Pneumococcal 13-valent conjugate (PCV13) vaccine.  When indicated, a person who is uncertain of his immunization history and has no record of immunization should receive the PCV13 vaccine. All adults 38 years of age and older should receive this vaccine. An adult aged 58 years or older who has certain medical conditions and has not been previously immunized should receive 1 dose of PCV13 vaccine. This PCV13 should be followed with a dose of pneumococcal polysaccharide (PPSV23) vaccine. Adults who are at high risk for pneumococcal disease should obtain the PPSV23 vaccine at least 8 weeks after the dose of PCV13 vaccine. Adults older than 62 years of age who have normal immune system function should obtain the PPSV23 vaccine dose at least 1 year after the dose of PCV13 vaccine.  Pneumococcal polysaccharide (PPSV23) vaccine. When PCV13 is also indicated, PCV13 should be obtained first. All adults aged 69 years and older should be immunized. An adult younger than age 86 years who has certain medical conditions should be immunized. Any person who resides in a nursing home or long-term care facility should be immunized. An adult smoker should be immunized. People with an immunocompromised condition and certain other conditions should receive both PCV13 and PPSV23 vaccines. People with human immunodeficiency virus (HIV) infection should be immunized as soon as possible after diagnosis. Immunization during chemotherapy or radiation therapy should be avoided. Routine use of PPSV23 vaccine is not recommended for American Indians, [place] Natives, or people younger than 65 years unless there are medical conditions that require PPSV23 vaccine. When indicated, people who have unknown immunization and have no record of immunization should receive PPSV23 vaccine. One-time revaccination 5 years after the first dose of PPSV23 is recommended for people aged 19-64 years who have chronic kidney failure, nephrotic syndrome, asplenia, or immunocompromised conditions. People who received  1-2 doses of PPSV23 before age 18 years should receive another dose of PPSV23 vaccine at age 73 years or later if at least 5 years have passed since the previous dose. Doses of PPSV23 are not needed for people immunized with PPSV23 at or  after age 60 years.  Meningococcal vaccine. Adults with asplenia or persistent complement component deficiencies should receive 2 doses of quadrivalent meningococcal conjugate (MenACWY-D) vaccine. The doses should be obtained at least 2 months apart. Microbiologists working with certain meningococcal bacteria, [place] recruits, people at risk during an outbreak, and people who travel to or live in countries with a high rate of meningitis should be immunized. A first-year college student up through age 67 years who is living in a residence hall should receive a dose if she did not receive a dose on or after her 16th birthday. Adults who have certain high-risk conditions should receive one or more doses of vaccine.  Hepatitis A vaccine. Adults who wish to be protected from this disease, have certain high-risk conditions, work with hepatitis A-infected animals, work in hepatitis A research labs, or travel to or work in countries with a high rate of hepatitis A should be immunized. Adults who were previously unvaccinated and who anticipate close contact with an international adoptee during the first 60 days after arrival in the [place] from a country with a high rate of hepatitis A should be immunized.  Hepatitis B vaccine. Adults who wish to be protected from this disease, have certain high-risk conditions, may be exposed to blood or other infectious body fluids, are household contacts or sex partners of hepatitis B positive people, are clients or workers in certain care facilities, or travel to or work in countries with a high rate of hepatitis B should be immunized.  Haemophilus influenzae type b (Hib) vaccine. A previously unvaccinated person with asplenia or sickle  cell disease or having a scheduled splenectomy should receive 1 dose of Hib vaccine. Regardless of previous immunization, a recipient of a hematopoietic stem cell transplant should receive a 3-dose series 6-12 months after her successful transplant. Hib vaccine is not recommended for adults with HIV infection. Preventive Services / Frequency Ages 77 to 23 years  Blood pressure check.** / Every 3-5 years.  Lipid and cholesterol check.** / Every 5 years beginning at age 12.  Clinical breast exam.** / Every 3 years for women in their 69s and 33s.  BRCA-related cancer risk assessment.** / For women who have family members with a BRCA-related cancer (breast, ovarian, tubal, or peritoneal cancers).  Pap test.** / Every 2 years from ages 57 through 70. Every 3 years starting at age 60 through age 47 or 17 with a history of 3 consecutive normal Pap tests.  HPV screening.** / Every 3 years from ages 84 through ages 65 to 27 with a history of 3 consecutive normal Pap tests.  Hepatitis C blood test.** / For any individual with known risks for hepatitis C.  Skin self-exam. / Monthly.  Influenza vaccine. / Every year.  Tetanus, diphtheria, and acellular pertussis (Tdap, Td) vaccine.** / Consult your health care provider. Pregnant women should receive 1 dose of Tdap vaccine during each pregnancy. 1 dose of Td every 10 years.  Varicella vaccine.** / Consult your health care provider. Pregnant females who do not have evidence of immunity should receive the first dose after pregnancy.  HPV vaccine. / 3 doses over 6 months, if 87 and younger. The vaccine is not recommended for use in pregnant females. However, pregnancy testing is not needed before receiving a dose.  Measles, mumps, rubella (MMR) vaccine.** / You need at least 1 dose of MMR if you were born in 1957 or later. You may also need a 2nd dose. For females of childbearing age,  rubella immunity should be determined. If there is no evidence of  immunity, females who are not pregnant should be vaccinated. If there is no evidence of immunity, females who are pregnant should delay immunization until after pregnancy.  Pneumococcal 13-valent conjugate (PCV13) vaccine.** / Consult your health care provider.  Pneumococcal polysaccharide (PPSV23) vaccine.** / 1 to 2 doses if you smoke cigarettes or if you have certain conditions.  Meningococcal vaccine.** / 1 dose if you are age 67 to 8 years and a Market researcher living in a residence hall, or have one of several medical conditions, you need to get vaccinated against meningococcal disease. You may also need additional booster doses.  Hepatitis A vaccine.** / Consult your health care provider.  Hepatitis B vaccine.** / Consult your health care provider.  Haemophilus influenzae type b (Hib) vaccine.** / Consult your health care provider. Ages 57 to 27 years  Blood pressure check.** / Every year.  Lipid and cholesterol check.** / Every 5 years beginning at age 13 years.  Lung cancer screening. / Every year if you are aged 33-80 years and have a 30-pack-year history of smoking and currently smoke or have quit within the past 15 years. Yearly screening is stopped once you have quit smoking for at least 15 years or develop a health problem that would prevent you from having lung cancer treatment.  Clinical breast exam.** / Every year after age 28 years.  BRCA-related cancer risk assessment.** / For women who have family members with a BRCA-related cancer (breast, ovarian, tubal, or peritoneal cancers).  Mammogram.** / Every year beginning at age 28 years and continuing for as long as you are in good health. Consult with your health care provider.  Pap test.** / Every 3 years starting at age 45 years through age 33 or 68 years with a history of 3 consecutive normal Pap tests.  HPV screening.** / Every 3 years from ages 43 years through ages 76 to 56 years with a history of 3  consecutive normal Pap tests.  Fecal occult blood test (FOBT) of stool. / Every year beginning at age 66 years and continuing until age 77 years. You may not need to do this test if you get a colonoscopy every 10 years.  Flexible sigmoidoscopy or colonoscopy.** / Every 5 years for a flexible sigmoidoscopy or every 10 years for a colonoscopy beginning at age 22 years and continuing until age 89 years.  Hepatitis C blood test.** / For all people born from [birth year] through 1967 and any individual with known risks for hepatitis C.  Skin self-exam. / Monthly.  Influenza vaccine. / Every year.  Tetanus, diphtheria, and acellular pertussis (Tdap/Td) vaccine.** / Consult your health care provider. Pregnant women should receive 1 dose of Tdap vaccine during each pregnancy. 1 dose of Td every 10 years.  Varicella vaccine.** / Consult your health care provider. Pregnant females who do not have evidence of immunity should receive the first dose after pregnancy.  Zoster vaccine.** / 1 dose for adults aged 70 years or older.  Measles, mumps, rubella (MMR) vaccine.** / You need at least 1 dose of MMR if you were born in 1957 or later. You may also need a second dose. For females of childbearing age, rubella immunity should be determined. If there is no evidence of immunity, females who are not pregnant should be vaccinated. If there is no evidence of immunity, females who are pregnant should delay immunization until after pregnancy.  Pneumococcal 13-valent conjugate (PCV13) vaccine.** /  Consult your health care provider.  Pneumococcal polysaccharide (PPSV23) vaccine.** / 1 to 2 doses if you smoke cigarettes or if you have certain conditions.  Meningococcal vaccine.** / Consult your health care provider.  Hepatitis A vaccine.** / Consult your health care provider.  Hepatitis B vaccine.** / Consult your health care provider.  Haemophilus influenzae type b (Hib) vaccine.** / Consult your health care  provider. Ages 5 years and over  Blood pressure check.** / Every year.  Lipid and cholesterol check.** / Every 5 years beginning at age 25 years.  Lung cancer screening. / Every year if you are aged 8-80 years and have a 30-pack-year history of smoking and currently smoke or have quit within the past 15 years. Yearly screening is stopped once you have quit smoking for at least 15 years or develop a health problem that would prevent you from having lung cancer treatment.  Clinical breast exam.** / Every year after age 25 years.  BRCA-related cancer risk assessment.** / For women who have family members with a BRCA-related cancer (breast, ovarian, tubal, or peritoneal cancers).  Mammogram.** / Every year beginning at age 57 years and continuing for as long as you are in good health. Consult with your health care provider.  Pap test.** / Every 3 years starting at age 34 years through age 76 or 73 years with 3 consecutive normal Pap tests. Testing can be stopped between 65 and 70 years with 3 consecutive normal Pap tests and no abnormal Pap or HPV tests in the past 10 years.  HPV screening.** / Every 3 years from ages 47 years through ages 30 or 39 years with a history of 3 consecutive normal Pap tests. Testing can be stopped between 65 and 70 years with 3 consecutive normal Pap tests and no abnormal Pap or HPV tests in the past 10 years.  Fecal occult blood test (FOBT) of stool. / Every year beginning at age 46 years and continuing until age 69 years. You may not need to do this test if you get a colonoscopy every 10 years.  Flexible sigmoidoscopy or colonoscopy.** / Every 5 years for a flexible sigmoidoscopy or every 10 years for a colonoscopy beginning at age 75 years and continuing until age 24 years.  Hepatitis C blood test.** / For all people born from [birth year] through 1967 and any individual with known risks for hepatitis C.  Osteoporosis screening.** / A one-time screening for women ages  76 years and over and women at risk for fractures or osteoporosis.  Skin self-exam. / Monthly.  Influenza vaccine. / Every year.  Tetanus, diphtheria, and acellular pertussis (Tdap/Td) vaccine.** / 1 dose of Td every 10 years.  Varicella vaccine.** / Consult your health care provider.  Zoster vaccine.** / 1 dose for adults aged 22 years or older.  Pneumococcal 13-valent conjugate (PCV13) vaccine.** / Consult your health care provider.  Pneumococcal polysaccharide (PPSV23) vaccine.** / 1 dose for all adults aged 76 years and older.  Meningococcal vaccine.** / Consult your health care provider.  Hepatitis A vaccine.** / Consult your health care provider.  Hepatitis B vaccine.** / Consult your health care provider.  Haemophilus influenzae type b (Hib) vaccine.** / Consult your health care provider. ** Family history and personal history of risk and conditions may change your health care provider's recommendations.   This information is not intended to replace advice given to you by your health care provider. Make sure you discuss any questions you have with your health care provider.  Document Released: 02/25/2001 Document Revised: 01/20/2014 Document Reviewed: 05/27/2010 Elsevier Interactive Patient Education [employer].

## 2015-01-23 ENCOUNTER — Ambulatory Visit (HOSPITAL_BASED_OUTPATIENT_CLINIC_OR_DEPARTMENT_OTHER)
Admission: RE | Admit: 2015-01-23 | Discharge: 2015-01-23 | Disposition: A | Discharge status: Home / Self Care | Payer: BLUE CROSS/BLUE SHIELD | Source: Ambulatory Visit | Attending: Family Medicine | Admitting: Family Medicine

## 2015-01-23 ENCOUNTER — Other Ambulatory Visit: Payer: BLUE CROSS/BLUE SHIELD

## 2015-01-23 DIAGNOSIS — Z1239 Encounter for other screening for malignant neoplasm of breast: Secondary | ICD-10-CM | POA: Insufficient documentation

## 2015-01-23 DIAGNOSIS — Z1231 Encounter for screening mammogram for malignant neoplasm of breast: Secondary | ICD-10-CM | POA: Diagnosis present

## 2015-01-23 LAB — CBC
HCT: 40.5 % (ref 36.0–46.0)
Hemoglobin: 13.8 g/dL (ref 12.0–15.0)
MCH: 31.9 pg (ref 26.0–34.0)
MCHC: 34.1 g/dL (ref 30.0–36.0)
MCV: 93.5 fL (ref 78.0–100.0)
MPV: 9.7 fL (ref 8.6–12.4)
Platelets: 294 10*3/uL (ref 150–400)
RBC: 4.33 MIL/uL (ref 3.87–5.11)
RDW: 14.1 % (ref 11.5–15.5)
WBC: 5.6 10*3/uL (ref 4.0–10.5)

## 2015-01-24 LAB — COMPREHENSIVE METABOLIC PANEL
ALT: 17 U/L (ref 6–29)
AST: 20 U/L (ref 10–35)
Albumin: 4.3 g/dL (ref 3.6–5.1)
Alkaline Phosphatase: 74 U/L (ref 33–130)
BUN: 19 mg/dL (ref 7–25)
CO2: 29 mmol/L (ref 20–31)
Calcium: 9.6 mg/dL (ref 8.6–10.4)
Chloride: 99 mmol/L (ref 98–110)
Creat: 0.81 mg/dL (ref 0.50–0.99)
Glucose, Bld: 110 mg/dL — ABNORMAL HIGH (ref 65–99)
Potassium: 3.8 mmol/L (ref 3.5–5.3)
Sodium: 137 mmol/L (ref 135–146)
Total Bilirubin: 0.5 mg/dL (ref 0.2–1.2)
Total Protein: 6.7 g/dL (ref 6.1–8.1)

## 2015-01-24 LAB — LIPID PANEL
Cholesterol: 238 mg/dL — ABNORMAL HIGH (ref 125–200)
HDL: 85 mg/dL (ref >=46)
LDL Cholesterol: 139 mg/dL — ABNORMAL HIGH (ref <130)
Total CHOL/HDL Ratio: 2.8 Ratio (ref <=5.0)
Triglycerides: 72 mg/dL (ref <150)
VLDL: 14 mg/dL (ref <30)

## 2015-01-24 LAB — HEPATITIS C ANTIBODY: HCV Ab: NEGATIVE

## 2015-01-24 LAB — VITAMIN D 25 HYDROXY (VIT D DEFICIENCY, FRACTURES): Vit D, 25-Hydroxy: 32 ng/mL (ref 30–100)

## 2015-01-24 LAB — TSH: TSH: 1.477 u[IU]/mL (ref 0.350–4.500)

## 2015-01-25 ENCOUNTER — Telehealth: Payer: Self-pay | Admitting: Family Medicine

## 2015-01-25 NOTE — Telephone Encounter (Signed)
error:[id number] ° °

## 2015-01-28 NOTE — Assessment & Plan Note (Signed)
Patient encouraged to maintain heart healthy diet, regular exercise, adequate sleep. Consider daily probiotics. Take medications as prescribed. Labs reviewed 

## 2015-01-28 NOTE — Progress Notes (Signed)
Patient ID: Lauren [name], female   DOB: 04-07-1955, 60 y.o.   MRN: [id number]   Subjective:    Patient ID: Lauren [name], female    DOB: 1955-06-07, 60 y.o.   MRN: [id number]  Chief Complaint  Patient presents with  . Annual Exam    HPI Patient is in today for annual exam. She feels well today. Venlafaxine is working well and she denies any significant anhedonia or concerns with depression. No recent illness or acute concerns. Follows with Dr. Collene Mares for colonoscopies and she is up-to-date. Denies CP/palp/SOB/HA/congestion/fevers/GI or GU c/o. Taking meds as prescribed  Past Medical History  Diagnosis Date  . Chicken pox as a child  . Shingles 75 yrs old  . Measles as a child  . 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 Depression     been on antidepressants on and off for 20 yr  . GERD (gastroesophageal reflux disease)     silent with hiatal hernia  . Hiatal hernia 05/22/2013  . Postmenopausal atrophic vaginitis 05/22/2013  . Hiatal hernia with gastroesophageal reflux 05/22/2013  . Unspecified vitamin D deficiency 05/22/2013  . Hot tub folliculitis [id number]  . Preventative health care 01/19/2015    Past Surgical History  Procedure Laterality Date  . Abdominal hysterectomy  1998    partial-still has ovaries  . Esophagectomy  05-2008  . Induced abortion  1979    Family History  Problem Relation Age of Onset  . Cancer Mother 10    breast cancer  . Emphysema Mother     smoker  . Heart attack Father     X several  . Mental illness Brother     bipolar, xanax and thc abuse  . Obesity Brother   . Depression Daughter   . Anxiety disorder Daughter   . Other Maternal Grandmother     blood clot, dvt, lung  . Cancer Paternal Grandmother     stomach  . Heart disease Paternal Grandfather     Social History   Social History  . Marital Status: Married    Spouse Name: [name]  . Number of Children: N/A  . Years of Education: N/A   Occupational History  .      Event  Planner   Social History Main Topics  . Smoking status: Former Smoker -- 0.10 packs/day for 25 years    Types: Cigarettes    Start date: 01/13/1998  . Smokeless tobacco: Never Used     Comment: 1-2 cigarettes a day  . Alcohol Use: Yes     Comment: a couple glasses of wine every other day  . Drug Use: No  . Sexual Activity:    Partners: Male     Comment: lives with husband, event planner, no dietary restrictions   Other Topics Concern  . 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 Multiple Vitamins-Minerals (MULTIVITAL PO) Take by mouth daily. liquid    . esomeprazole (NEXIUM) 40 MG capsule Take 1 capsule (40 mg total) by mouth daily. 90 capsule 1  . venlafaxine XR (EFFEXOR-XR) 75 MG 24 hr capsule Take 1 capsule (75 mg total) by mouth daily. 30 capsule 0  . ciprofloxacin (CIPRO) 500 MG tablet Take 1 tablet (500 mg total) by mouth 2 (two) times daily. 14 tablet 0  . neomycin-polymyxin-hydrocortisone (CORTISPORIN) otic solution Place 3 drops into  both ears 2 (two) times daily. 10 mL 0  . permethrin (ELIMITE) 5 % cream Apply head to toe at bedtime and wash off in 8 hours in am. 60 g 1  . triamcinolone cream (KENALOG) 0.1 % Apply 1 application topically 2 (two) times daily. 45 g 1   No facility-administered medications prior to visit.    Allergies  Allergen Reactions  . Penicillins Hives    Review of Systems  Constitutional: Negative for fever, chills and malaise/fatigue.  HENT: Negative for congestion and hearing loss.   Eyes: Negative for discharge.  Respiratory: Negative for cough, sputum production and shortness of breath.   Cardiovascular: Negative for chest pain, palpitations and leg swelling.  Gastrointestinal: Negative for heartburn, nausea, vomiting, abdominal pain, diarrhea, constipation and blood in stool.  Genitourinary: Negative for dysuria, urgency,  frequency and hematuria.  Musculoskeletal: Negative for myalgias, back pain and falls.  Skin: Negative for rash.  Neurological: Negative for dizziness, sensory change, loss of consciousness, weakness and headaches.  Endo/Heme/Allergies: Negative for environmental allergies. Does not bruise/bleed easily.  Psychiatric/Behavioral: Negative for depression and suicidal ideas. The patient is not nervous/anxious and does not have insomnia.        Objective:    Physical Exam  Constitutional: She is oriented to person, place, and time. She appears well-developed and well-nourished. No distress.  HENT:  Head: Normocephalic and atraumatic.  Eyes: Conjunctivae are normal.  Neck: Neck supple. No thyromegaly present.  Cardiovascular: Normal rate, regular rhythm and normal heart sounds.   No murmur heard. Pulmonary/Chest: Effort normal and breath sounds normal. No respiratory distress.  Abdominal: Soft. Bowel sounds are normal. She exhibits no distension and no mass. There is no tenderness.  Musculoskeletal: She exhibits no edema.  Lymphadenopathy:    She has no cervical adenopathy.  Neurological: She is alert and oriented to person, place, and time.  Skin: Skin is warm and dry.  Psychiatric: She has a normal mood and affect. Her behavior is normal.    BP 139/67 mmHg  Pulse 74  Temp(Src) 97.6 F (36.4 C) (Oral)  Ht 5\' 4"  (1.626 m)  Wt 127 lb (57.607 kg)  BMI 21.79 kg/m2  SpO2 100% Wt Readings from Last 3 Encounters:  01/19/15 127 lb (57.607 kg)  12/13/13 120 lb (54.432 kg)  05/17/13 117 lb 0.6 oz (53.089 kg)     Lab Results  Component Value Date   WBC 5.6 01/23/2015   HGB 13.8 01/23/2015   HCT 40.5 01/23/2015   PLT 294 01/23/2015   GLUCOSE 110* 01/23/2015   CHOL 238* 01/23/2015   TRIG 72 01/23/2015   HDL 85 01/23/2015   LDLCALC 139* 01/23/2015   ALT 17 01/23/2015   AST 20 01/23/2015   NA 137 01/23/2015   K 3.8 01/23/2015   CL 99 01/23/2015   CREATININE 0.81 01/23/2015    BUN 19 01/23/2015   CO2 29 01/23/2015   TSH 1.477 01/23/2015   INR 1.0 05/04/2008    Lab Results  Component Value Date   TSH 1.477 01/23/2015   Lab Results  Component Value Date   WBC 5.6 01/23/2015   HGB 13.8 01/23/2015   HCT 40.5 01/23/2015   MCV 93.5 01/23/2015   PLT 294 01/23/2015   Lab Results  Component Value Date   NA 137 01/23/2015   K 3.8 01/23/2015   CO2 29 01/23/2015   GLUCOSE 110* 01/23/2015   BUN 19 01/23/2015   CREATININE 0.81 01/23/2015   BILITOT 0.5 01/23/2015   ALKPHOS  74 01/23/2015   AST 20 01/23/2015   ALT 17 01/23/2015   PROT 6.7 01/23/2015   ALBUMIN 4.3 01/23/2015   CALCIUM 9.6 01/23/2015   Lab Results  Component Value Date   CHOL 238* 01/23/2015   Lab Results  Component Value Date   HDL 85 01/23/2015   Lab Results  Component Value Date   LDLCALC 139* 01/23/2015   Lab Results  Component Value Date   TRIG 72 01/23/2015   Lab Results  Component Value Date   CHOLHDL 2.8 01/23/2015   No results found for: HGBA1C     Assessment & Plan:   Problem List Items Addressed This Visit    Depression    Doing well on Venlafaxine, continue same      Relevant Medications   venlafaxine XR (EFFEXOR-XR) 75 MG 24 hr capsule   Hiatal hernia with gastroesophageal reflux    Asymptomatic, may continue Nexium prn      Relevant Medications   esomeprazole (NEXIUM) 40 MG capsule   Other Relevant Orders   TSH (Completed)   CBC (Completed)   Lipid panel (Completed)   Comprehensive metabolic panel (Completed)   VITAMIN D 25 Hydroxy (Vit-D Deficiency, Fractures) (Completed)   Hepatitis C Antibody (Completed)   Preventative health care - Primary    Patient encouraged to maintain heart healthy diet, regular exercise, adequate sleep. Consider daily probiotics. Take medications as prescribed. Labs reviewed      Relevant Orders   TSH (Completed)   CBC (Completed)   Lipid panel (Completed)   Comprehensive metabolic panel (Completed)   VITAMIN D 25  Hydroxy (Vit-D Deficiency, Fractures) (Completed)   Hepatitis C Antibody (Completed)   Vitamin D deficiency    Continue OTC Vitamin D supplements      Relevant Orders   TSH (Completed)   CBC (Completed)   Lipid panel (Completed)   Comprehensive metabolic panel (Completed)   VITAMIN D 25 Hydroxy (Vit-D Deficiency, Fractures) (Completed)    Other Visit Diagnoses    Colon cancer screening        Relevant Orders    Ambulatory referral to Gastroenterology    Hepatitis C Antibody (Completed)    Breast cancer screening        Relevant Orders    Hepatitis C Antibody (Completed)    MM DIGITAL SCREENING BILATERAL (Completed)       I have discontinued Ms. Dolson's neomycin-polymyxin-hydrocortisone, ciprofloxacin, triamcinolone cream, and permethrin. I am also having her maintain her Multiple Vitamins-Minerals (MULTIVITAL PO), venlafaxine XR, and esomeprazole.  Meds ordered this encounter  Medications  . venlafaxine XR (EFFEXOR-XR) 75 MG 24 hr capsule    Sig: Take 1 capsule (75 mg total) by mouth daily.    Dispense:  90 capsule    Refill:  2  . esomeprazole (NEXIUM) 40 MG capsule    Sig: Take 1 capsule (40 mg total) by mouth daily.    Dispense:  90 capsule    Refill:  2     Penni Homans, MD

## 2015-01-28 NOTE — Assessment & Plan Note (Signed)
Doing well on Venlafaxine, continue same

## 2015-01-28 NOTE — Assessment & Plan Note (Signed)
Continue OTC Vitamin D supplements

## 2015-01-28 NOTE — Assessment & Plan Note (Signed)
Asymptomatic, may continue Nexium prn

## 2015-02-15 ENCOUNTER — Telehealth: Payer: Self-pay | Admitting: Family Medicine

## 2015-02-15 NOTE — Telephone Encounter (Signed)
Pt states [facility] is billing her for $300 from labs for DOS 01/23/15. Pt is very upset thinking it would be covered as part of her annual physical. Pt requesting call back at [phone].

## 2015-02-19 NOTE — Telephone Encounter (Signed)
Talked to patient and told her to contact [facility] about the bill she received to see why her insurance paid a portion of her bill patient will call back with additional information for me.

## 2015-02-20 NOTE — Telephone Encounter (Signed)
Returned patient call.  After talking to insurance, they informed her that they would cover 3 of the 5 test she had ran on her visit. [facility] told her she had to contact insurance and and the office for more information. She was driving and did not have the information with her so she will call me back and we will see what the EOB states as to why the bill was not covered or if the insurance was filled.

## 2015-02-20 NOTE — Telephone Encounter (Signed)
help

## 2015-02-20 NOTE — Telephone Encounter (Signed)
Patient returning your call.

## 2015-04-18 NOTE — Telephone Encounter (Signed)
See response below from [place]. This should be resolved.   [name] -   These are the 2 that I found for the patient with the DOS of 1.10.17.  She does not have any outstanding balance & both were paid in full by her insurance.  Maybe she is looking at EOB?  [id number] - CBC, CMP, LIPID, TSH, VITD (Paid in full by insurance)  [id number] - HCV - (Paid in full by insurance)  Let me know if you need any additional information on this.  Thanks

## 2015-05-07 ENCOUNTER — Telehealth: Payer: Self-pay | Admitting: Family Medicine

## 2015-05-07 MED ORDER — ESOMEPRAZOLE MAGNESIUM 40 MG PO CPDR: 40.0000 mg | DELAYED_RELEASE_CAPSULE | Freq: Every day | ORAL | Status: DC

## 2015-05-07 NOTE — Telephone Encounter (Signed)
Relation to pt:self  °Call back number: [phone] °Pharmacy: °[id number] - [place], [place] - [address] [phone] (Phone) °[phone] (Fax)  °  °  ° ° ° °Reason for call:  °Patient requesting a refill esomeprazole (NEXIUM) 40 MG capsule  °

## 2015-10-24 DIAGNOSIS — L309 Dermatitis, unspecified: Secondary | ICD-10-CM | POA: Diagnosis not present

## 2015-10-24 DIAGNOSIS — L299 Pruritus, unspecified: Secondary | ICD-10-CM | POA: Diagnosis not present

## 2015-10-24 DIAGNOSIS — Z23 Encounter for immunization: Secondary | ICD-10-CM | POA: Diagnosis not present

## 2015-12-13 ENCOUNTER — Telehealth: Payer: Self-pay | Admitting: Family Medicine

## 2015-12-13 MED ORDER — VENLAFAXINE HCL ER 75 MG PO CP24: 75.0000 mg | ORAL_CAPSULE | Freq: Every day | ORAL | 0 refills | Status: DC

## 2015-12-17 MED ORDER — VENLAFAXINE HCL ER 75 MG PO CP24: 75.0000 mg | ORAL_CAPSULE | Freq: Every day | ORAL | 0 refills | Status: DC

## 2015-12-17 NOTE — Telephone Encounter (Signed)
Sent into [facility]

## 2015-12-17 NOTE — Addendum Note (Signed)
Addended by: Sharon Seller B on: 12/17/2015 01:22 PM   Modules accepted: Orders

## 2015-12-17 NOTE — Telephone Encounter (Signed)
Caller name: [name] Relationship to patient: self Can be reached:  [phone] Pharmacy: [facility] [place], [address] AT [place]  Reason for call: pt called stating that she hasn't gotten refill on effexor. Pt stated [facility] is incorrect pharmacy. Her insurance requires her to use [facility]. Please resend to [facility] and cancel at [facility].

## 2016-03-17 ENCOUNTER — Other Ambulatory Visit: Payer: Self-pay | Admitting: Family Medicine

## 2016-03-17 DIAGNOSIS — Z1231 Encounter for screening mammogram for malignant neoplasm of breast: Secondary | ICD-10-CM

## 2016-03-24 ENCOUNTER — Ambulatory Visit (HOSPITAL_BASED_OUTPATIENT_CLINIC_OR_DEPARTMENT_OTHER): Payer: BLUE CROSS/BLUE SHIELD

## 2016-03-31 ENCOUNTER — Ambulatory Visit (HOSPITAL_BASED_OUTPATIENT_CLINIC_OR_DEPARTMENT_OTHER)
Admission: RE | Admit: 2016-03-31 | Discharge: 2016-03-31 | Disposition: A | Discharge status: Home / Self Care | Payer: BLUE CROSS/BLUE SHIELD | Source: Ambulatory Visit | Attending: Family Medicine | Admitting: Family Medicine

## 2016-03-31 ENCOUNTER — Encounter (HOSPITAL_BASED_OUTPATIENT_CLINIC_OR_DEPARTMENT_OTHER): Payer: Self-pay

## 2016-03-31 DIAGNOSIS — Z1231 Encounter for screening mammogram for malignant neoplasm of breast: Secondary | ICD-10-CM | POA: Diagnosis not present

## 2016-04-13 ENCOUNTER — Other Ambulatory Visit: Payer: Self-pay | Admitting: Family Medicine

## 2016-04-15 ENCOUNTER — Other Ambulatory Visit: Payer: Self-pay

## 2016-04-15 MED ORDER — ESOMEPRAZOLE MAGNESIUM 40 MG PO CPDR: 40.0000 mg | DELAYED_RELEASE_CAPSULE | Freq: Every day | ORAL | 2 refills | Status: DC

## 2016-05-12 ENCOUNTER — Other Ambulatory Visit: Payer: Self-pay | Admitting: Family Medicine

## 2016-05-19 ENCOUNTER — Ambulatory Visit (INDEPENDENT_AMBULATORY_CARE_PROVIDER_SITE_OTHER): Payer: BLUE CROSS/BLUE SHIELD | Admitting: Family Medicine

## 2016-05-19 ENCOUNTER — Encounter: Payer: Self-pay | Admitting: Family Medicine

## 2016-05-19 DIAGNOSIS — E559 Vitamin D deficiency, unspecified: Secondary | ICD-10-CM | POA: Diagnosis not present

## 2016-05-19 DIAGNOSIS — E782 Mixed hyperlipidemia: Secondary | ICD-10-CM

## 2016-05-19 DIAGNOSIS — R739 Hyperglycemia, unspecified: Secondary | ICD-10-CM | POA: Diagnosis not present

## 2016-05-19 DIAGNOSIS — K219 Gastro-esophageal reflux disease without esophagitis: Secondary | ICD-10-CM

## 2016-05-19 DIAGNOSIS — C801 Malignant (primary) neoplasm, unspecified: Secondary | ICD-10-CM

## 2016-05-19 DIAGNOSIS — E785 Hyperlipidemia, unspecified: Secondary | ICD-10-CM

## 2016-05-19 DIAGNOSIS — K449 Diaphragmatic hernia without obstruction or gangrene: Secondary | ICD-10-CM

## 2016-05-19 DIAGNOSIS — F339 Major depressive disorder, recurrent, unspecified: Secondary | ICD-10-CM

## 2016-05-19 HISTORY — DX: Hyperlipidemia, unspecified: E78.5

## 2016-05-19 HISTORY — DX: Hyperglycemia, unspecified: R73.9

## 2016-05-19 MED ORDER — VENLAFAXINE HCL ER 75 MG PO CP24: 75.0000 mg | ORAL_CAPSULE | Freq: Every day | ORAL | 2 refills | Status: DC

## 2016-05-19 NOTE — Patient Instructions (Signed)
Carbohydrate Counting for Diabetes Mellitus, Adult Carbohydrate counting is a method for keeping track of how many carbohydrates you eat. Eating carbohydrates naturally increases the amount of sugar (glucose) in the blood. Counting how many carbohydrates you eat helps keep your blood glucose within normal limits, which helps you manage your diabetes (diabetes mellitus). It is important to know how many carbohydrates you can safely have in each meal. This is different for every person. A diet and nutrition specialist (registered dietitian) can help you make a meal plan and calculate how many carbohydrates you should have at each meal and snack. Carbohydrates are found in the following foods:  Grains, such as breads and cereals.  Dried beans and soy products.  Starchy vegetables, such as potatoes, peas, and corn.  Fruit and fruit juices.  Milk and yogurt.  Sweets and snack foods, such as cake, cookies, candy, chips, and soft drinks. How do I count carbohydrates? There are two ways to count carbohydrates in food. You can use either of the methods or a combination of both. Reading "Nutrition Facts" on packaged food  The "Nutrition Facts" list is included on the labels of almost all packaged foods and beverages in the [place] It includes:  The serving size.  Information about nutrients in each serving, including the grams (g) of carbohydrate per serving. To use the "Nutrition Facts":  Decide how many servings you will have.  Multiply the number of servings by the number of carbohydrates per serving.  The resulting number is the total amount of carbohydrates that you will be having. Learning standard serving sizes of other foods  When you eat foods containing carbohydrates that are not packaged or do not include "Nutrition Facts" on the label, you need to measure the servings in order to count the amount of carbohydrates:  Measure the foods that you will eat with a food scale or measuring  cup, if needed.  Decide how many standard-size servings you will eat.  Multiply the number of servings by 15. Most carbohydrate-rich foods have about 15 g of carbohydrates per serving.  For example, if you eat 8 oz (170 g) of strawberries, you will have eaten 2 servings and 30 g of carbohydrates (2 servings x 15 g = 30 g).  For foods that have more than one food mixed, such as soups and casseroles, you must count the carbohydrates in each food that is included. The following list contains standard serving sizes of common carbohydrate-rich foods. Each of these servings has about 15 g of carbohydrates:   hamburger bun or  English muffin.   oz (15 mL) syrup.   oz (14 g) jelly.  1 slice of bread.  1 six-inch tortilla.  3 oz (85 g) cooked rice or pasta.  4 oz (113 g) cooked dried beans.  4 oz (113 g) starchy vegetable, such as peas, corn, or potatoes.  4 oz (113 g) hot cereal.  4 oz (113 g) mashed potatoes or  of a large baked potato.  4 oz (113 g) canned or frozen fruit.  4 oz (120 mL) fruit juice.  4-6 crackers.  6 chicken nuggets.  6 oz (170 g) unsweetened dry cereal.  6 oz (170 g) plain fat-free yogurt or yogurt sweetened with artificial sweeteners.  8 oz (240 mL) milk.  8 oz (170 g) fresh fruit or one small piece of fruit.  24 oz (680 g) popped popcorn. Example of carbohydrate counting Sample meal  3 oz (85 g) chicken breast.  6 oz (  170 g) brown rice.  4 oz (113 g) corn.  8 oz (240 mL) milk.  8 oz (170 g) strawberries with sugar-free whipped topping. Carbohydrate calculation 1. Identify the foods that contain carbohydrates:  Rice.  Corn.  Milk.  Strawberries. 2. Calculate how many servings you have of each food:  2 servings rice.  1 serving corn.  1 serving milk.  1 serving strawberries. 3. Multiply each number of servings by 15 g:  2 servings rice x 15 g = 30 g.  1 serving corn x 15 g = 15 g.  1 serving milk x 15 g = 15  g.  1 serving strawberries x 15 g = 15 g. 4. Add together all of the amounts to find the total grams of carbohydrates eaten:  30 g + 15 g + 15 g + 15 g = 75 g of carbohydrates total. This information is not intended to replace advice given to you by your health care provider. Make sure you discuss any questions you have with your health care provider. Document Released: 12/30/2004 Document Revised: 07/20/2015 Document Reviewed: 06/13/2015 Elsevier Interactive Patient Education  2017 [employer]  

## 2016-05-19 NOTE — Assessment & Plan Note (Signed)
hgba1c acceptable, minimize simple carbs. Increase exercise as tolerated.  

## 2016-05-19 NOTE — Assessment & Plan Note (Signed)
Encouraged heart healthy diet, increase exercise, avoid trans fats, consider a krill oil cap daily 

## 2016-05-19 NOTE — Progress Notes (Signed)
Pre visit review using our clinic review tool, if applicable. No additional management support is needed unless otherwise documented below in the visit note. 

## 2016-05-19 NOTE — Assessment & Plan Note (Signed)
Doing well on Venlafaxine, refill given

## 2016-05-19 NOTE — Assessment & Plan Note (Signed)
Asymptomatic at this time. Patient insurance no longer paying for PPI will try a trial off

## 2016-05-19 NOTE — Assessment & Plan Note (Signed)
Doing well no need for any meds at this time. Follows with Dr [name] and sees him roughly every 3 years or so

## 2016-05-19 NOTE — Assessment & Plan Note (Signed)
Check vitamin D level with next visit, encouraged daily supplement

## 2016-05-19 NOTE — Progress Notes (Signed)
Subjective:  I acted as a Neurosurgeon for Dr. Abner Greenspan. Faiz [place], [place]   Patient ID: Lauren [name], female    DOB: [birth year], 61 y.o.   MRN: [id number]  Chief Complaint  Patient presents with  . Medication Refill    HPI  Patient is in today for a medication follow up. Patient needs a refill on her Effexor. She feels the Et hospitaliz febrile illness.No headache or nausea. Denies CP/palp/SOB/HA/congestion/fevers/GI or GU c/o. Taking meds as prescribed  Patient Care Team: Bradd Canary, MD as PCP - General (Family Medicine)   Past Medical History:  Diagnosis Date  . 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 Chicken pox as a child  . Depression    been on antidepressants on and off for 20 yr  . GERD (gastroesophageal reflux disease)    silent with hiatal hernia  . Hiatal hernia 05/22/2013  . Hiatal hernia with gastroesophageal reflux 05/22/2013  . Hot tub folliculitis 12/18/2013  . Hyperglycemia 05/19/2016  . Hyperlipidemia 05/19/2016  . Measles as a child  . Postmenopausal atrophic vaginitis 05/22/2013  . Preventative health care 01/19/2015  . Shingles 61 yrs old  . Unspecified vitamin D deficiency 05/22/2013    Past Surgical History:  Procedure Laterality Date  . ABDOMINAL HYSTERECTOMY  1998   partial-still has ovaries  . ESOPHAGECTOMY  05-2008  . INDUCED ABORTION  1979    Family History  Problem Relation Age of Onset  . Cancer Mother 29    breast cancer  . Emphysema Mother     smoker  . Heart attack Father     X several  . Mental illness Brother     bipolar, xanax and thc abuse  . Obesity Brother   . Depression Daughter   . Anxiety disorder Daughter   . Other Maternal Grandmother     blood clot, dvt, lung  . Cancer Paternal Grandmother     stomach  . Heart disease Paternal Grandfather     Social History   Social History  . Marital status: Married    Spouse name: Rosanne Ashing  . Number of children: N/A  . Years of education: N/A   Occupational History  .  Software engineer   Social History Main Topics  . Smoking status: Former Smoker    Packs/day: 0.10    Years: 25.00    Types: Cigarettes    Start date: 01/13/1998  . Smokeless tobacco: Never Used     Comment: 1-2 cigarettes a day  . Alcohol use Yes     Comment: a couple glasses of wine every other day  . Drug use: No  . Sexual activity: Yes    Partners: Male     Comment: lives with husband, event planner, no dietary restrictions   Other Topics Concern  . Not on file   Social History Narrative   Married, husband Rosanne Ashing   Event planner    Outpatient Medications Prior to Visit  Medication Sig Dispense Refill  . Multiple Vitamins-Minerals (MULTIVITAL PO) Take by mouth daily. liquid    . esomeprazole (NEXIUM) 40 MG capsule Take 1 capsule (40 mg total) by mouth daily. 90 capsule 2  . venlafaxine XR (EFFEXOR-XR) 75 MG 24 hr capsule TAKE ONE CAPSULE BY MOUTH EVERY DAY 30 capsule 0   No facility-administered medications prior to visit.     Allergies  Allergen Reactions  . Penicillins Hives    Review of Systems  Constitutional: Negative  for fever and malaise/fatigue.  HENT: Negative for congestion.   Eyes: Negative for blurred vision.  Respiratory: Negative for shortness of breath.   Cardiovascular: Negative for chest pain, palpitations and leg swelling.  Gastrointestinal: Negative for abdominal pain, blood in stool and nausea.  Genitourinary: Negative for dysuria and frequency.  Musculoskeletal: Negative for falls.  Skin: Negative for rash.  Neurological: Negative for dizziness, loss of consciousness and headaches.  Endo/Heme/Allergies: Negative for environmental allergies.  Psychiatric/Behavioral: Negative for depression. The patient is not nervous/anxious.        Objective:    Physical Exam  Constitutional: She is oriented to person, place, and time. She appears well-developed and well-nourished. No distress.  HENT:  Head: Normocephalic and atraumatic.    Nose: Nose normal.  Eyes: Right eye exhibits no discharge. Left eye exhibits no discharge.  Neck: Normal range of motion. Neck supple.  Cardiovascular: Normal rate and regular rhythm.   No murmur heard. Pulmonary/Chest: Effort normal and breath sounds normal.  Abdominal: Soft. Bowel sounds are normal. There is no tenderness.  Musculoskeletal: She exhibits no edema.  Neurological: She is alert and oriented to person, place, and time.  Skin: Skin is warm and dry.  Psychiatric: She has a normal mood and affect.  Nursing note and vitals reviewed.   BP 98/62 (BP Location: Left Arm, Patient Position: Sitting, Cuff Size: Normal)   Pulse 75   Temp 99.5 F (37.5 C) (Oral)   Resp 18   Wt 120 lb 6.4 oz (54.6 kg)   SpO2 98%   BMI 20.67 kg/m  Wt Readings from Last 3 Encounters:  05/19/16 120 lb 6.4 oz (54.6 kg)  01/19/15 127 lb (57.6 kg)  12/13/13 120 lb (54.4 kg)   BP Readings from Last 3 Encounters:  05/19/16 98/62  01/19/15 139/67  12/13/13 121/72     Immunization History  Administered Date(s) Administered  . Influenza,inj,Quad PF,36+ Mos 11/08/2014  . Influenza-Unspecified 11/17/2013  . Tdap 01/13/2010, 11/08/2014    Health Maintenance  Topic Date Due  . HIV Screening  02/11/1968  . PAP SMEAR  01/14/1999  . COLONOSCOPY  01/14/2015  . INFLUENZA VACCINE  08/13/2016  . MAMMOGRAM  04/01/2018  . TETANUS/TDAP  11/07/2024  . Hepatitis C Screening  Completed    Lab Results  Component Value Date   WBC 5.6 01/23/2015   HGB 13.8 01/23/2015   HCT 40.5 01/23/2015   PLT 294 01/23/2015   GLUCOSE 110 (H) 01/23/2015   CHOL 238 (H) 01/23/2015   TRIG 72 01/23/2015   HDL 85 01/23/2015   LDLCALC 139 (H) 01/23/2015   ALT 17 01/23/2015   AST 20 01/23/2015   NA 137 01/23/2015   K 3.8 01/23/2015   CL 99 01/23/2015   CREATININE 0.81 01/23/2015   BUN 19 01/23/2015   CO2 29 01/23/2015   TSH 1.477 01/23/2015   INR 1.0 05/04/2008    Lab Results  Component Value Date   TSH 1.477  01/23/2015   Lab Results  Component Value Date   WBC 5.6 01/23/2015   HGB 13.8 01/23/2015   HCT 40.5 01/23/2015   MCV 93.5 01/23/2015   PLT 294 01/23/2015   Lab Results  Component Value Date   NA 137 01/23/2015   K 3.8 01/23/2015   CO2 29 01/23/2015   GLUCOSE 110 (H) 01/23/2015   BUN 19 01/23/2015   CREATININE 0.81 01/23/2015   BILITOT 0.5 01/23/2015   ALKPHOS 74 01/23/2015   AST 20 01/23/2015   ALT 17  01/23/2015   PROT 6.7 01/23/2015   ALBUMIN 4.3 01/23/2015   CALCIUM 9.6 01/23/2015   Lab Results  Component Value Date   CHOL 238 (H) 01/23/2015   Lab Results  Component Value Date   HDL 85 01/23/2015   Lab Results  Component Value Date   LDLCALC 139 (H) 01/23/2015   Lab Results  Component Value Date   TRIG 72 01/23/2015   Lab Results  Component Value Date   CHOLHDL 2.8 01/23/2015   No results found for: HGBA1C       Assessment & Plan:   Problem List Items Addressed This Visit    Cancer [facility])    Doing well no need for any meds at this time. Follows with Dr Elnoria Howard and sees him roughly every 3 years or so      Depression    Doing well on Venlafaxine, refill given      Relevant Medications   venlafaxine XR (EFFEXOR-XR) 75 MG 24 hr capsule   Hiatal hernia with gastroesophageal reflux    Asymptomatic at this time. Patient insurance no longer paying for PPI will try a trial off      Relevant Orders   CBC   Comprehensive metabolic panel   Vitamin D deficiency    Check vitamin D level with next visit, encouraged daily supplement      Relevant Orders   VITAMIN D 25 Hydroxy (Vit-D Deficiency, Fractures)   Hyperglycemia    hgba1c acceptable, minimize simple carbs. Increase exercise as tolerated.       Relevant Orders   Hemoglobin A1c   TSH   Hyperlipidemia    Encouraged heart healthy diet, increase exercise, avoid trans fats, consider a krill oil cap daily      Relevant Orders   Lipid panel   TSH      I have discontinued Ms. Hougland's  esomeprazole. I have also changed her venlafaxine XR. Additionally, I am having her maintain her Multiple Vitamins-Minerals (MULTIVITAL PO).  Meds ordered this encounter  Medications  . venlafaxine XR (EFFEXOR-XR) 75 MG 24 hr capsule    Sig: Take 1 capsule (75 mg total) by mouth daily.    Dispense:  90 capsule    Refill:  2    CMA served as scribe during this visit. History, Physical and Plan performed by medical provider. Documentation and orders reviewed and attested to.  Danise Edge, MD

## 2017-01-19 ENCOUNTER — Telehealth: Payer: Self-pay

## 2017-01-19 NOTE — Telephone Encounter (Signed)
PA initiated via Covermymeds; KEY: [id number]. Awaiting determination.

## 2017-01-20 NOTE — Telephone Encounter (Signed)
Pt confirmed in response below that she has tried no other alternatives to nexium. She was already advised that we would have to change her medication so I will let her know pantoprazole will be the new medication. Will that be 40mg  once a day?

## 2017-01-20 NOTE — Telephone Encounter (Signed)
Pt called checking status of nexium refill. States she has been out of medication x 6 days and is in need of refill soon.  Notified pt of below. She states she has never stopped the Nexium and is still taking it. Pt reports that she has tried no other alternatives for her reflux.  Pt would like someone to call her to notify her of which medication she will be changed to.   Please see below options and advise?

## 2017-01-20 NOTE — Telephone Encounter (Signed)
So we need to restart the PA process for the Nexium but also communicate with patient that she can take OTC Nexium 20 mg tabs, 2 at a time to equal the 40 mg prescription while we work on this. Also confirm and document what other meds patient has failed for the PA

## 2017-01-20 NOTE — Telephone Encounter (Signed)
PA denied. Esomeprazole not covered by Pt's plan. Preferred alternatives are: Dexilant, Nexium granules?, pantoprazole. However, per med list Pt is no longer on medication.

## 2017-01-20 NOTE — Telephone Encounter (Signed)
Yes but if we can talk to patient and see what she has failed sometimes we can resubmit? She has not been my patient long so she may have failed other meds. If not then we will have to explain to her that we have to switch to Pantoprazole due to her insurance demands. thanks

## 2017-01-20 NOTE — Telephone Encounter (Signed)
Yes please Pantoprazole 40 mg po daily #30 with 5 rf or #90 with 1 rf at patient discretion.

## 2017-01-20 NOTE — Telephone Encounter (Signed)
PA was already completed and denied for Nexium/esomeprazole- see 2 messages down. Thank you.

## 2017-01-20 NOTE — Telephone Encounter (Signed)
I believe Lauren [name] talked w/ Pt about what she has tried and failed.

## 2017-01-21 MED ORDER — PANTOPRAZOLE SODIUM 40 MG PO TBEC: 40.0000 mg | DELAYED_RELEASE_TABLET | Freq: Every day | ORAL | 1 refills | Status: DC

## 2017-01-21 NOTE — Telephone Encounter (Signed)
Attempted to reach pt and left detailed message on home # and to call if any questions. Rx sent.

## 2017-01-21 NOTE — Addendum Note (Signed)
Addended by: Kelle Darting A on: 01/21/2017 09:06 AM   Modules accepted: Orders

## 2017-03-23 ENCOUNTER — Other Ambulatory Visit: Payer: Self-pay | Admitting: Family Medicine

## 2017-03-23 ENCOUNTER — Telehealth: Payer: Self-pay | Admitting: Family Medicine

## 2017-03-23 NOTE — Telephone Encounter (Signed)
Copied from [place]. Topic: Quick Communication - See Telephone Encounter >> Mar 23, 2017 12:02 PM Conception Chancy, NT wrote: CRM for notification. See Telephone encounter for:  03/23/17.  Patient is calling and states she is needing a refill on venlafaxine XR (EFFEXOR-XR) 75 MG 24 hr capsule. Patient has 2 left. Patient states her best friend died 2 days ago and really would like to have this refilled before she runs out. Please advise.   [facility] - [place], [address] AT [address]  [place] [address]  Phone: [phone] Fax: [phone]

## 2017-03-23 NOTE — Telephone Encounter (Signed)
LOv 05/19/2016 with Dr. Charlett Blake / See patients request for Effexor / Future appointment scheduled for 04/20/17

## 2017-03-24 ENCOUNTER — Other Ambulatory Visit: Payer: Self-pay | Admitting: Family Medicine

## 2017-03-24 NOTE — Telephone Encounter (Signed)
rx was sent in yesterday

## 2017-04-20 ENCOUNTER — Ambulatory Visit (INDEPENDENT_AMBULATORY_CARE_PROVIDER_SITE_OTHER): Payer: BLUE CROSS/BLUE SHIELD | Admitting: Family Medicine

## 2017-04-20 ENCOUNTER — Encounter: Payer: Self-pay | Admitting: Family Medicine

## 2017-04-20 VITALS — BP 100/60 | HR 88 | Temp 98.2°F | Resp 16 | Ht 64.17 in | Wt 120.4 lb

## 2017-04-20 DIAGNOSIS — R739 Hyperglycemia, unspecified: Secondary | ICD-10-CM

## 2017-04-20 DIAGNOSIS — Z0000 Encounter for general adult medical examination without abnormal findings: Secondary | ICD-10-CM | POA: Diagnosis not present

## 2017-04-20 DIAGNOSIS — Z1239 Encounter for other screening for malignant neoplasm of breast: Secondary | ICD-10-CM

## 2017-04-20 DIAGNOSIS — N952 Postmenopausal atrophic vaginitis: Secondary | ICD-10-CM | POA: Diagnosis not present

## 2017-04-20 DIAGNOSIS — E559 Vitamin D deficiency, unspecified: Secondary | ICD-10-CM | POA: Diagnosis not present

## 2017-04-20 DIAGNOSIS — Z1231 Encounter for screening mammogram for malignant neoplasm of breast: Secondary | ICD-10-CM

## 2017-04-20 DIAGNOSIS — E2839 Other primary ovarian failure: Secondary | ICD-10-CM | POA: Diagnosis not present

## 2017-04-20 DIAGNOSIS — E782 Mixed hyperlipidemia: Secondary | ICD-10-CM

## 2017-04-20 MED ORDER — ESTROGENS, CONJUGATED 0.625 MG/GM VA CREA: 1.0000 | TOPICAL_CREAM | Freq: Every evening | VAGINAL | 12 refills | Status: DC | PRN

## 2017-04-20 MED ORDER — PANTOPRAZOLE SODIUM 40 MG PO TBEC: 40.0000 mg | DELAYED_RELEASE_TABLET | Freq: Every day | ORAL | 1 refills | Status: DC

## 2017-04-20 MED ORDER — VENLAFAXINE HCL ER 75 MG PO CP24: 75.0000 mg | ORAL_CAPSULE | Freq: Every day | ORAL | 0 refills | Status: DC

## 2017-04-20 NOTE — Assessment & Plan Note (Signed)
hgba1c acceptable, minimize simple carbs. Increase exercise as tolerated.  

## 2017-04-20 NOTE — Assessment & Plan Note (Signed)
Patient encouraged to maintain heart healthy diet, regular exercise, adequate sleep. Consider daily probiotics. Take medications as prescribed 

## 2017-04-20 NOTE — Patient Instructions (Addendum)
GoodRx CoverMyMeds Start Premarin cream twice a week and drop to weekly if able Shingrix is the new shingles shot 2 shots over 2-6 months, at office or at pharmacy, confirm with insurance they will cover Preventive Care 40-64 Years, Female Preventive care refers to lifestyle choices and visits with your health care provider that can promote health and wellness. What does preventive care include?  A yearly physical exam. This is also called an annual well check.  Dental exams once or twice a year.  Routine eye exams. Ask your health care provider how often you should have your eyes checked.  Personal lifestyle choices, including: ? Daily care of your teeth and gums. ? Regular physical activity. ? Eating a healthy diet. ? Avoiding tobacco and drug use. ? Limiting alcohol use. ? Practicing safe sex. ? Taking low-dose aspirin daily starting at age 3. ? Taking vitamin and mineral supplements as recommended by your health care provider. What happens during an annual well check? The services and screenings done by your health care provider during your annual well check will depend on your age, overall health, lifestyle risk factors, and family history of disease. Counseling Your health care provider may ask you questions about your:  Alcohol use.  Tobacco use.  Drug use.  Emotional well-being.  Home and relationship well-being.  Sexual activity.  Eating habits.  Work and work Statistician.  Method of birth control.  Menstrual cycle.  Pregnancy history.  Screening You may have the following tests or measurements:  Height, weight, and BMI.  Blood pressure.  Lipid and cholesterol levels. These may be checked every 5 years, or more frequently if you are over 58 years old.  Skin check.  Lung cancer screening. You may have this screening every year starting at age 66 if you have a 30-pack-year history of smoking and currently smoke or have quit within the past 15  years.  Fecal occult blood test (FOBT) of the stool. You may have this test every year starting at age 54.  Flexible sigmoidoscopy or colonoscopy. You may have a sigmoidoscopy every 5 years or a colonoscopy every 10 years starting at age 3.  Hepatitis C blood test.  Hepatitis B blood test.  Sexually transmitted disease (STD) testing.  Diabetes screening. This is done by checking your blood sugar (glucose) after you have not eaten for a while (fasting). You may have this done every 1-3 years.  Mammogram. This may be done every 1-2 years. Talk to your health care provider about when you should start having regular mammograms. This may depend on whether you have a family history of breast cancer.  BRCA-related cancer screening. This may be done if you have a family history of breast, ovarian, tubal, or peritoneal cancers.  Pelvic exam and Pap test. This may be done every 3 years starting at age 85. Starting at age 9, this may be done every 5 years if you have a Pap test in combination with an HPV test.  Bone density scan. This is done to screen for osteoporosis. You may have this scan if you are at high risk for osteoporosis.  Discuss your test results, treatment options, and if necessary, the need for more tests with your health care provider. Vaccines Your health care provider may recommend certain vaccines, such as:  Influenza vaccine. This is recommended every year.  Tetanus, diphtheria, and acellular pertussis (Tdap, Td) vaccine. You may need a Td booster every 10 years.  Varicella vaccine. You may need this if  you have not been vaccinated.  Zoster vaccine. You may need this after age 60.  Measles, mumps, and rubella (MMR) vaccine. You may need at least one dose of MMR if you were born in 1957 or later. You may also need a second dose.  Pneumococcal 13-valent conjugate (PCV13) vaccine. You may need this if you have certain conditions and were not previously  vaccinated.  Pneumococcal polysaccharide (PPSV23) vaccine. You may need one or two doses if you smoke cigarettes or if you have certain conditions.  Meningococcal vaccine. You may need this if you have certain conditions.  Hepatitis A vaccine. You may need this if you have certain conditions or if you travel or work in places where you may be exposed to hepatitis A.  Hepatitis B vaccine. You may need this if you have certain conditions or if you travel or work in places where you may be exposed to hepatitis B.  Haemophilus influenzae type b (Hib) vaccine. You may need this if you have certain conditions.  Talk to your health care provider about which screenings and vaccines you need and how often you need them. This information is not intended to replace advice given to you by your health care provider. Make sure you discuss any questions you have with your health care provider. Document Released: 01/26/2015 Document Revised: 09/19/2015 Document Reviewed: 10/31/2014 Elsevier Interactive Patient Education  2018 [employer]  

## 2017-04-20 NOTE — Assessment & Plan Note (Signed)
Premarin cream qhs once to twice a week

## 2017-04-20 NOTE — Assessment & Plan Note (Signed)
Encouraged heart healthy diet, increase exercise, avoid trans fats, consider a krill oil cap daily 

## 2017-04-20 NOTE — Assessment & Plan Note (Signed)
Encouraged Shingrix

## 2017-04-20 NOTE — Assessment & Plan Note (Signed)
Not taking vitamin D daily

## 2017-04-20 NOTE — Progress Notes (Signed)
Subjective:  I acted as a Education administrator for [employer]. Yancey Flemings, [place]   Patient ID: Lauren [name], female    DOB: [birth year], 62 y.o.   MRN: [id number]  Chief Complaint  Patient presents with  . Annual Exam    HPI  Patient is in today for annual exam. She feels well today. No recent febrile illness or hospitalizations. She continues to struggle with atrophic vaginitis and dryness and never picked up the premarin cream due to cost. She is doing well with activities of daily living. She stays active and exercises regularly. She eats a heart healthy diet. Denies CP/palp/SOB/HA/congestion/fevers/GI or GU c/o. Taking meds as prescribed  Patient Care Team: [name], MD as PCP - General (Family Medicine)   Past Medical History:  Diagnosis Date  . 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 Chicken pox as a child  . Depression    been on antidepressants on and off for 20 yr  . GERD (gastroesophageal reflux disease)    silent with hiatal hernia  . Hiatal hernia 05/22/2013  . Hiatal hernia with gastroesophageal reflux 05/22/2013  . Hot tub folliculitis 40/09/8117  . Hyperglycemia 05/19/2016  . Hyperlipidemia 05/19/2016  . Measles as a child  . Postmenopausal atrophic vaginitis 05/22/2013  . Preventative health care 01/19/2015  . Shingles 62 yrs old  . Unspecified vitamin D deficiency 05/22/2013    Past Surgical History:  Procedure Laterality Date  . ABDOMINAL HYSTERECTOMY  1998   partial-still has ovaries  . ESOPHAGECTOMY  05-2008  . INDUCED ABORTION  1979    Family History  Problem Relation Age of Onset  . Cancer Mother 4       breast cancer  . Emphysema Mother        smoker  . Heart attack Father        X several  . Mental illness Brother        bipolar, xanax and thc abuse  . Obesity Brother   . Depression Daughter   . Anxiety disorder Daughter   . Other Maternal Grandmother        blood clot, dvt, lung  . Cancer Paternal Grandmother        stomach  . Heart disease Paternal  Grandfather     Social History   Socioeconomic History  . Marital status: Married    Spouse name: [name]  . Number of children: Not on file  . Years of education: Not on file  . Highest education level: Not on file  Occupational History    Employer: [employer]    Comment: Event Planner  Social Needs  . Financial resource strain: Not on file  . Food insecurity:    Worry: Not on file    Inability: Not on file  . Transportation needs:    Medical: Not on file    Non-medical: Not on file  Tobacco Use  . Smoking status: Former Smoker    Packs/day: 0.10    Years: 25.00    Pack years: 2.50    Types: Cigarettes    Start date: 01/13/1998  . Smokeless tobacco: Never Used  . Tobacco comment: 1-2 cigarettes a day  Substance and Sexual Activity  . Alcohol use: Yes    Comment: a couple glasses of wine every other day  . Drug use: No  . Sexual activity: Yes    Partners: Male    Comment: lives with husband, event planner, no dietary restrictions  Lifestyle  . Physical  activity:    Days per week: Not on file    Minutes per session: Not on file  . Stress: Not on file  Relationships  . Social connections:    Talks on phone: Not on file    Gets together: Not on file    Attends religious service: Not on file    Active member of club or organization: Not on file    Attends meetings of clubs or organizations: Not on file    Relationship status: Not on file  . Intimate partner violence:    Fear of current or ex partner: Not on file    Emotionally abused: Not on file    Physically abused: Not on file    Forced sexual activity: Not on file  Other Topics Concern  . 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 Multiple Vitamins-Minerals (MULTIVITAL PO) Take by mouth daily. liquid    . pantoprazole (PROTONIX) 40 MG tablet Take 1 tablet (40 mg total) by mouth daily.  90 tablet 1  . venlafaxine XR (EFFEXOR-XR) 75 MG 24 hr capsule TAKE ONE CAPSULE BY MOUTH EVERY DAY 90 capsule 0   No facility-administered medications prior to visit.     Allergies  Allergen Reactions  . Penicillins Hives    Review of Systems  Constitutional: Negative for chills, fever and malaise/fatigue.  HENT: Negative for congestion and hearing loss.   Eyes: Negative for discharge.  Respiratory: Negative for cough, sputum production and shortness of breath.   Cardiovascular: Negative for chest pain, palpitations and leg swelling.  Gastrointestinal: Negative for abdominal pain, blood in stool, constipation, diarrhea, heartburn, nausea and vomiting.  Genitourinary: Negative for dysuria, frequency, hematuria and urgency.  Musculoskeletal: Negative for back pain, falls and myalgias.  Skin: Negative for rash.  Neurological: Negative for dizziness, sensory change, loss of consciousness, weakness and headaches.  Endo/Heme/Allergies: Negative for environmental allergies. Does not bruise/bleed easily.  Psychiatric/Behavioral: Negative for depression and suicidal ideas. The patient is not nervous/anxious and does not have insomnia.        Objective:    Physical Exam  Constitutional: She is oriented to person, place, and time. No distress.  HENT:  Head: Normocephalic and atraumatic.  Right Ear: External ear normal.  Left Ear: External ear normal.  Nose: Nose normal.  Mouth/Throat: Oropharynx is clear and moist. No oropharyngeal exudate.  Eyes: Pupils are equal, round, and reactive to light. Conjunctivae are normal. Right eye exhibits no discharge. Left eye exhibits no discharge. No scleral icterus.  Neck: Normal range of motion. Neck supple. No thyromegaly present.  Cardiovascular: Normal rate, regular rhythm, normal heart sounds and intact distal pulses.  No murmur heard. Pulmonary/Chest: Effort normal and breath sounds normal. No respiratory distress. She has no wheezes. She has  no rales.  Abdominal: Soft. Bowel sounds are normal. She exhibits no distension and no mass. There is no tenderness.  Musculoskeletal: Normal range of motion. She exhibits no edema or tenderness.  Lymphadenopathy:    She has no cervical adenopathy.  Neurological: She is alert and oriented to person, place, and time. She has normal reflexes. She displays normal reflexes. No cranial nerve deficit. Coordination normal.  Skin: Skin is warm and dry. No rash noted. She is not diaphoretic.    BP 100/60 (BP Location: Left Arm, Patient Position: Sitting, Cuff Size: Normal)   Pulse 88  Temp 98.2 F (36.8 C) (Oral)   Resp 16   Ht 5' 4.17" (1.63 m)   Wt 120 lb 6.4 oz (54.6 kg)   SpO2 96%   BMI 20.56 kg/m  Wt Readings from Last 3 Encounters:  04/20/17 120 lb 6.4 oz (54.6 kg)  05/19/16 120 lb 6.4 oz (54.6 kg)  01/19/15 127 lb (57.6 kg)   BP Readings from Last 3 Encounters:  04/20/17 100/60  05/19/16 98/62  01/19/15 139/67     Immunization History  Administered Date(s) Administered  . Influenza,inj,Quad PF,6+ Mos 11/08/2014  . Influenza-Unspecified 11/17/2013  . Tdap 01/13/2010, 11/08/2014    Health Maintenance  Topic Date Due  . HIV Screening  02/11/1968  . PAP SMEAR  01/14/1999  . COLONOSCOPY  01/14/2015  . INFLUENZA VACCINE  08/13/2017  . MAMMOGRAM  04/01/2018  . TETANUS/TDAP  11/07/2024  . Hepatitis C Screening  Completed    Lab Results  Component Value Date   WBC 5.6 01/23/2015   HGB 13.8 01/23/2015   HCT 40.5 01/23/2015   PLT 294 01/23/2015   GLUCOSE 110 (H) 01/23/2015   CHOL 238 (H) 01/23/2015   TRIG 72 01/23/2015   HDL 85 01/23/2015   LDLCALC 139 (H) 01/23/2015   ALT 17 01/23/2015   AST 20 01/23/2015   NA 137 01/23/2015   K 3.8 01/23/2015   CL 99 01/23/2015   CREATININE 0.81 01/23/2015   BUN 19 01/23/2015   CO2 29 01/23/2015   TSH 1.477 01/23/2015   INR 1.0 05/04/2008    Lab Results  Component Value Date   TSH 1.477 01/23/2015   Lab Results    Component Value Date   WBC 5.6 01/23/2015   HGB 13.8 01/23/2015   HCT 40.5 01/23/2015   MCV 93.5 01/23/2015   PLT 294 01/23/2015   Lab Results  Component Value Date   NA 137 01/23/2015   K 3.8 01/23/2015   CO2 29 01/23/2015   GLUCOSE 110 (H) 01/23/2015   BUN 19 01/23/2015   CREATININE 0.81 01/23/2015   BILITOT 0.5 01/23/2015   ALKPHOS 74 01/23/2015   AST 20 01/23/2015   ALT 17 01/23/2015   PROT 6.7 01/23/2015   ALBUMIN 4.3 01/23/2015   CALCIUM 9.6 01/23/2015   Lab Results  Component Value Date   CHOL 238 (H) 01/23/2015   Lab Results  Component Value Date   HDL 85 01/23/2015   Lab Results  Component Value Date   LDLCALC 139 (H) 01/23/2015   Lab Results  Component Value Date   TRIG 72 01/23/2015   Lab Results  Component Value Date   CHOLHDL 2.8 01/23/2015   No results found for: HGBA1C       Assessment & Plan:   Problem List Items Addressed This Visit    Postmenopausal atrophic vaginitis    Premarin cream qhs once to twice a week      Vitamin D deficiency    Not taking vitamin D daily      Relevant Orders   Comprehensive metabolic panel   VITAMIN D 25 Hydroxy (Vit-D Deficiency, Fractures)   Breast cancer screening - Primary    Patient encouraged to maintain heart healthy diet, regular exercise, adequate sleep. Consider daily probiotics. Take medications as prescribed      Relevant Orders   MM 3D SCREEN BREAST BILATERAL   Hyperglycemia    hgba1c acceptable, minimize simple carbs. Increase exercise as tolerated.       Relevant Orders   Hemoglobin A1c  Comprehensive metabolic panel   TSH   Hyperlipidemia    Encouraged heart healthy diet, increase exercise, avoid trans fats, consider a krill oil cap daily      Relevant Orders   Lipid panel   TSH    Other Visit Diagnoses    Estrogen deficiency       Relevant Orders   DG Bone Density      I have changed Eliseo Squires. Moskal's venlafaxine XR. I am also having her start on conjugated  estrogens. Additionally, I am having her maintain her Multiple Vitamins-Minerals (MULTIVITAL PO) and pantoprazole.  Meds ordered this encounter  Medications  . venlafaxine XR (EFFEXOR-XR) 75 MG 24 hr capsule    Sig: Take 1 capsule (75 mg total) by mouth daily.    Dispense:  90 capsule    Refill:  0  . pantoprazole (PROTONIX) 40 MG tablet    Sig: Take 1 tablet (40 mg total) by mouth daily.    Dispense:  90 tablet    Refill:  1  . conjugated estrogens (PREMARIN) vaginal cream    Sig: Place 1 Applicatorful vaginally at bedtime as needed.    Dispense:  42.5 g    Refill:  12    CMA served as scribe during this visit. History, Physical and Plan performed by medical provider. Documentation and orders reviewed and attested to.  Penni Homans, MD

## 2017-04-24 ENCOUNTER — Other Ambulatory Visit (INDEPENDENT_AMBULATORY_CARE_PROVIDER_SITE_OTHER): Payer: BLUE CROSS/BLUE SHIELD

## 2017-04-24 ENCOUNTER — Ambulatory Visit (INDEPENDENT_AMBULATORY_CARE_PROVIDER_SITE_OTHER)
Admission: RE | Admit: 2017-04-24 | Discharge: 2017-04-24 | Disposition: A | Discharge status: Home / Self Care | Payer: BLUE CROSS/BLUE SHIELD | Source: Ambulatory Visit | Attending: Family Medicine | Admitting: Family Medicine

## 2017-04-24 DIAGNOSIS — R739 Hyperglycemia, unspecified: Secondary | ICD-10-CM

## 2017-04-24 DIAGNOSIS — E2839 Other primary ovarian failure: Secondary | ICD-10-CM

## 2017-04-24 DIAGNOSIS — E782 Mixed hyperlipidemia: Secondary | ICD-10-CM

## 2017-04-24 DIAGNOSIS — E559 Vitamin D deficiency, unspecified: Secondary | ICD-10-CM

## 2017-04-24 DIAGNOSIS — Z0000 Encounter for general adult medical examination without abnormal findings: Secondary | ICD-10-CM | POA: Diagnosis not present

## 2017-04-24 LAB — COMPREHENSIVE METABOLIC PANEL
ALT: 22 U/L (ref 0–35)
AST: 20 U/L (ref 0–37)
Albumin: 4.3 g/dL (ref 3.5–5.2)
Alkaline Phosphatase: 71 U/L (ref 39–117)
BUN: 14 mg/dL (ref 6–23)
CO2: 32 mEq/L (ref 19–32)
Calcium: 9.8 mg/dL (ref 8.4–10.5)
Chloride: 101 mEq/L (ref 96–112)
Creatinine, Ser: 0.83 mg/dL (ref 0.40–1.20)
GFR: 73.51 mL/min (ref >60.00)
Glucose, Bld: 98 mg/dL (ref 70–99)
Potassium: 4.9 mEq/L (ref 3.5–5.1)
Sodium: 139 mEq/L (ref 135–145)
Total Bilirubin: 0.4 mg/dL (ref 0.2–1.2)
Total Protein: 7.2 g/dL (ref 6.0–8.3)

## 2017-04-24 LAB — CBC
HCT: 42.5 % (ref 36.0–46.0)
Hemoglobin: 14.2 g/dL (ref 12.0–15.0)
MCHC: 33.4 g/dL (ref 30.0–36.0)
MCV: 95.6 fl (ref 78.0–100.0)
Platelets: 288 10*3/uL (ref 150.0–400.0)
RBC: 4.45 Mil/uL (ref 3.87–5.11)
RDW: 14.3 % (ref 11.5–15.5)
WBC: 5.2 10*3/uL (ref 4.0–10.5)

## 2017-04-24 LAB — HEMOGLOBIN A1C: Hgb A1c MFr Bld: 6 % (ref 4.6–6.5)

## 2017-04-24 LAB — LIPID PANEL
Cholesterol: 235 mg/dL — ABNORMAL HIGH (ref 0–200)
HDL: 81.5 mg/dL (ref >39.00)
LDL Cholesterol: 140 mg/dL — ABNORMAL HIGH (ref 0–99)
NonHDL: 153.58
Total CHOL/HDL Ratio: 3
Triglycerides: 66 mg/dL (ref 0.0–149.0)
VLDL: 13.2 mg/dL (ref 0.0–40.0)

## 2017-04-24 LAB — VITAMIN D 25 HYDROXY (VIT D DEFICIENCY, FRACTURES): VITD: 30.79 ng/mL (ref 30.00–100.00)

## 2017-04-24 LAB — TSH: TSH: 2.15 u[IU]/mL (ref 0.35–4.50)

## 2017-04-26 DIAGNOSIS — E2839 Other primary ovarian failure: Secondary | ICD-10-CM | POA: Diagnosis not present

## 2017-05-04 ENCOUNTER — Ambulatory Visit (HOSPITAL_BASED_OUTPATIENT_CLINIC_OR_DEPARTMENT_OTHER): Payer: BLUE CROSS/BLUE SHIELD

## 2017-05-11 ENCOUNTER — Ambulatory Visit (HOSPITAL_BASED_OUTPATIENT_CLINIC_OR_DEPARTMENT_OTHER)
Admission: RE | Admit: 2017-05-11 | Discharge: 2017-05-11 | Disposition: A | Discharge status: Home / Self Care | Payer: BLUE CROSS/BLUE SHIELD | Source: Ambulatory Visit | Attending: Family Medicine | Admitting: Family Medicine

## 2017-05-11 DIAGNOSIS — Z1231 Encounter for screening mammogram for malignant neoplasm of breast: Secondary | ICD-10-CM | POA: Diagnosis not present

## 2017-05-11 DIAGNOSIS — Z1239 Encounter for other screening for malignant neoplasm of breast: Secondary | ICD-10-CM

## 2017-07-23 ENCOUNTER — Other Ambulatory Visit: Payer: Self-pay | Admitting: Family Medicine

## 2017-10-20 ENCOUNTER — Ambulatory Visit: Payer: BLUE CROSS/BLUE SHIELD | Admitting: Family Medicine

## 2017-10-20 VITALS — BP 120/70 | HR 68 | Temp 97.8°F | Resp 18 | Wt 122.6 lb

## 2017-10-20 DIAGNOSIS — R739 Hyperglycemia, unspecified: Secondary | ICD-10-CM

## 2017-10-20 DIAGNOSIS — E785 Hyperlipidemia, unspecified: Secondary | ICD-10-CM | POA: Diagnosis not present

## 2017-10-20 DIAGNOSIS — E559 Vitamin D deficiency, unspecified: Secondary | ICD-10-CM

## 2017-10-20 DIAGNOSIS — Z23 Encounter for immunization: Secondary | ICD-10-CM

## 2017-10-20 DIAGNOSIS — K219 Gastro-esophageal reflux disease without esophagitis: Secondary | ICD-10-CM

## 2017-10-20 DIAGNOSIS — K449 Diaphragmatic hernia without obstruction or gangrene: Secondary | ICD-10-CM

## 2017-10-20 MED ORDER — ESTROGENS, CONJUGATED 0.625 MG/GM VA CREA: 1.0000 | TOPICAL_CREAM | Freq: Every evening | VAGINAL | 3 refills | Status: DC | PRN

## 2017-10-20 NOTE — Patient Instructions (Addendum)
Shingrix is the new shingles shot, 2 shots over 2-6 months check with insurance regarding coverage and can get at office or at pharmacy depending on cost CoverMyMeds and GoodRx  Cholesterol Cholesterol is a white, waxy, fat-like substance that is needed by the human body in small amounts. The liver makes all the cholesterol we need. Cholesterol is carried from the liver by the blood through the blood vessels. Deposits of cholesterol (plaques) may build up on blood vessel (artery) walls. Plaques make the arteries narrower and stiffer. Cholesterol plaques increase the risk for heart attack and stroke. You cannot feel your cholesterol level even if it is very high. The only way to know that it is high is to have a blood test. Once you know your cholesterol levels, you should keep a record of the test results. Work with your health care provider to keep your levels in the desired range. What do the results mean?  Total cholesterol is a rough measure of all the cholesterol in your blood.  LDL (low-density lipoprotein) is the "bad" cholesterol. This is the type that causes plaque to build up on the artery walls. You want this level to be low.  HDL (high-density lipoprotein) is the "good" cholesterol because it cleans the arteries and carries the LDL away. You want this level to be high.  Triglycerides are fat that the body can either burn for energy or store. High levels are closely linked to heart disease. What are the desired levels of cholesterol?  Total cholesterol below 200.  LDL below 100 for people who are at risk, below 70 for people at very high risk.  HDL above 40 is good. A level of 60 or higher is considered to be protective against heart disease.  Triglycerides below 150. How can I lower my cholesterol? Diet Follow your diet program as told by your health care provider.  Choose fish or white meat chicken and [place], roasted or baked. Limit fatty cuts of red meat, fried foods, and  processed meats, such as sausage and lunch meats.  Eat lots of fresh fruits and vegetables.  Choose whole grains, beans, pasta, potatoes, and cereals.  Choose olive oil, corn oil, or canola oil, and use only small amounts.  Avoid butter, mayonnaise, shortening, or palm kernel oils.  Avoid foods with trans fats.  Drink skim or nonfat milk and eat low-fat or nonfat yogurt and cheeses. Avoid whole milk, cream, ice cream, egg yolks, and full-fat cheeses.  Healthier desserts include angel food cake, ginger snaps, animal crackers, hard candy, popsicles, and low-fat or nonfat frozen yogurt. Avoid pastries, cakes, pies, and cookies.  Exercise  Follow your exercise program as told by your health care provider. A regular program: ? Helps to decrease LDL and raise HDL. ? Helps with weight control.  Do things that increase your activity level, such as gardening, walking, and taking the stairs.  Ask your health care provider about ways that you can be more active in your daily life.  Medicine  Take over-the-counter and prescription medicines only as told by your health care provider. ? Medicine may be prescribed by your health care provider to help lower cholesterol and decrease the risk for heart disease. This is usually done if diet and exercise have failed to bring down cholesterol levels. ? If you have several risk factors, you may need medicine even if your levels are normal.  This information is not intended to replace advice given to you by your health care provider. Make  sure you discuss any questions you have with your health care provider. Document Released: 09/24/2000 Document Revised: 07/28/2015 Document Reviewed: 06/30/2015 Elsevier Interactive Patient Education  [employer].

## 2017-10-25 NOTE — Assessment & Plan Note (Signed)
Supplement and monitor 

## 2017-10-25 NOTE — Assessment & Plan Note (Signed)
Avoid offending foods, start probiotics. Do not eat large meals in late evening and consider raising head of bed. Doing well on Pantoprazole 

## 2017-10-25 NOTE — Assessment & Plan Note (Signed)
Encouraged heart healthy diet, increase exercise, avoid trans fats, consider a krill oil cap daily 

## 2017-10-25 NOTE — Progress Notes (Signed)
Subjective:    Patient ID: Lauren [name], female    DOB: 10/01/1955, 62 y.o.   MRN: [id number]  No chief complaint on file.   HPI Patient is in today for follow up. She feels well today. No recent febrile illness or hospitalizations. She reports her reflux is well controlled with clean diet and Pantoprazole. Denies CP/palp/SOB/HA/congestion/fevers/GI or GU c/o. Taking meds as prescribed  Past Medical History:  Diagnosis Date  . 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 Chicken pox as a child  . Depression    been on antidepressants on and off for 20 yr  . GERD (gastroesophageal reflux disease)    silent with hiatal hernia  . Hiatal hernia 05/22/2013  . Hiatal hernia with gastroesophageal reflux 05/22/2013  . Hot tub folliculitis 88/09/1692  . Hyperglycemia 05/19/2016  . Hyperlipidemia 05/19/2016  . Measles as a child  . Postmenopausal atrophic vaginitis 05/22/2013  . Preventative health care 01/19/2015  . Shingles 62 yrs old  . Unspecified vitamin D deficiency 05/22/2013    Past Surgical History:  Procedure Laterality Date  . ABDOMINAL HYSTERECTOMY  1998   partial-still has ovaries  . ESOPHAGECTOMY  05-2008  . INDUCED ABORTION  1979    Family History  Problem Relation Age of Onset  . Cancer Mother 75       breast cancer  . Emphysema Mother        smoker  . Heart attack Father        X several  . Mental illness Brother        bipolar, xanax and thc abuse  . Obesity Brother   . Depression Daughter   . Anxiety disorder Daughter   . Other Maternal Grandmother        blood clot, dvt, lung  . Cancer Paternal Grandmother        stomach  . Heart disease Paternal Grandfather     Social History   Socioeconomic History  . Marital status: Married    Spouse name: [name]  . Number of children: Not on file  . Years of education: Not on file  . Highest education level: Not on file  Occupational History    Employer: [employer]    Comment: Event Planner  Social  Needs  . Financial resource strain: Not on file  . Food insecurity:    Worry: Not on file    Inability: Not on file  . Transportation needs:    Medical: Not on file    Non-medical: Not on file  Tobacco Use  . Smoking status: Former Smoker    Packs/day: 0.10    Years: 25.00    Pack years: 2.50    Types: Cigarettes    Start date: 01/13/1998  . Smokeless tobacco: Never Used  . Tobacco comment: 1-2 cigarettes a day  Substance and Sexual Activity  . Alcohol use: Yes    Comment: a couple glasses of wine every other day  . Drug use: No  . Sexual activity: Yes    Partners: Male    Comment: lives with husband, event planner, no dietary restrictions  Lifestyle  . Physical activity:    Days per week: Not on file    Minutes per session: Not on file  . Stress: Not on file  Relationships  . Social connections:    Talks on phone: Not on file    Gets together: Not on file    Attends religious service: Not on file  Active member of club or organization: Not on file    Attends meetings of clubs or organizations: Not on file    Relationship status: Not on file  . Intimate partner violence:    Fear of current or ex partner: Not on file    Emotionally abused: Not on file    Physically abused: Not on file    Forced sexual activity: Not on file  Other Topics Concern  . 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 Multiple Vitamins-Minerals (MULTIVITAL PO) Take by mouth daily. liquid    . pantoprazole (PROTONIX) 40 MG tablet Take 1 tablet (40 mg total) by mouth daily. 90 tablet 1  . venlafaxine XR (EFFEXOR-XR) 75 MG 24 hr capsule TAKE ONE CAPSULE BY MOUTH DAILY 90 capsule 0  . conjugated estrogens (PREMARIN) vaginal cream Place 1 Applicatorful vaginally at bedtime as needed. 42.5 g 12   No facility-administered medications prior to visit.     Allergies  Allergen Reactions    . Penicillins Hives    Review of Systems  Constitutional: Negative for fever and malaise/fatigue.  HENT: Negative for congestion.   Eyes: Negative for blurred vision.  Respiratory: Negative for shortness of breath.   Cardiovascular: Negative for chest pain, palpitations and leg swelling.  Gastrointestinal: Negative for abdominal pain, blood in stool and nausea.  Genitourinary: Negative for dysuria and frequency.  Musculoskeletal: Negative for falls.  Skin: Negative for rash.  Neurological: Negative for dizziness, loss of consciousness and headaches.  Endo/Heme/Allergies: Negative for environmental allergies.  Psychiatric/Behavioral: Negative for depression. The patient is not nervous/anxious.        Objective:    Physical Exam  Constitutional: She is oriented to person, place, and time. She appears well-developed and well-nourished. No distress.  HENT:  Head: Normocephalic and atraumatic.  Nose: Nose normal.  Eyes: Right eye exhibits no discharge. Left eye exhibits no discharge.  Neck: Normal range of motion. Neck supple.  Cardiovascular: Normal rate and regular rhythm.  No murmur heard. Pulmonary/Chest: Effort normal and breath sounds normal.  Abdominal: Soft. Bowel sounds are normal. There is no tenderness.  Musculoskeletal: She exhibits no edema.  Neurological: She is alert and oriented to person, place, and time.  Skin: Skin is warm and dry.  Psychiatric: She has a normal mood and affect.  Nursing note and vitals reviewed.   BP 120/70 (BP Location: Left Arm, Patient Position: Sitting, Cuff Size: Normal)   Pulse 68   Temp 97.8 F (36.6 C) (Oral)   Resp 18   Wt 122 lb 9.6 oz (55.6 kg)   SpO2 98%   BMI 20.93 kg/m  Wt Readings from Last 3 Encounters:  10/20/17 122 lb 9.6 oz (55.6 kg)  04/20/17 120 lb 6.4 oz (54.6 kg)  05/19/16 120 lb 6.4 oz (54.6 kg)     Lab Results  Component Value Date   WBC 5.2 04/24/2017   HGB 14.2 04/24/2017   HCT 42.5 04/24/2017    PLT 288.0 04/24/2017   GLUCOSE 98 04/24/2017   CHOL 235 (H) 04/24/2017   TRIG 66.0 04/24/2017   HDL 81.50 04/24/2017   LDLCALC 140 (H) 04/24/2017   ALT 22 04/24/2017   AST 20 04/24/2017   NA 139 04/24/2017   K 4.9 04/24/2017   CL 101 04/24/2017   CREATININE 0.83 04/24/2017   BUN 14 04/24/2017   CO2 32  04/24/2017   TSH 2.15 04/24/2017   INR 1.0 05/04/2008   HGBA1C 6.0 04/24/2017    Lab Results  Component Value Date   TSH 2.15 04/24/2017   Lab Results  Component Value Date   WBC 5.2 04/24/2017   HGB 14.2 04/24/2017   HCT 42.5 04/24/2017   MCV 95.6 04/24/2017   PLT 288.0 04/24/2017   Lab Results  Component Value Date   NA 139 04/24/2017   K 4.9 04/24/2017   CO2 32 04/24/2017   GLUCOSE 98 04/24/2017   BUN 14 04/24/2017   CREATININE 0.83 04/24/2017   BILITOT 0.4 04/24/2017   ALKPHOS 71 04/24/2017   AST 20 04/24/2017   ALT 22 04/24/2017   PROT 7.2 04/24/2017   ALBUMIN 4.3 04/24/2017   CALCIUM 9.8 04/24/2017   GFR 73.51 04/24/2017   Lab Results  Component Value Date   CHOL 235 (H) 04/24/2017   Lab Results  Component Value Date   HDL 81.50 04/24/2017   Lab Results  Component Value Date   LDLCALC 140 (H) 04/24/2017   Lab Results  Component Value Date   TRIG 66.0 04/24/2017   Lab Results  Component Value Date   CHOLHDL 3 04/24/2017   Lab Results  Component Value Date   HGBA1C 6.0 04/24/2017       Assessment & Plan:   Problem List Items Addressed This Visit    Hiatal hernia with gastroesophageal reflux    Avoid offending foods, start probiotics. Do not eat large meals in late evening and consider raising head of bed. Doing well on Pantoprazole.       Relevant Orders   CBC   TSH   Vitamin D deficiency - Primary    Supplement and monitor      Relevant Orders   VITAMIN D 25 Hydroxy (Vit-D Deficiency, Fractures)   Hyperglycemia    hgba1c acceptable, minimize simple carbs. Increase exercise as tolerated.       Relevant Orders   Hemoglobin  A1c   Comprehensive metabolic panel   TSH   Hyperlipidemia    Encouraged heart healthy diet, increase exercise, avoid trans fats, consider a krill oil cap daily      Relevant Orders   Lipid panel   TSH      I am having Larena Glassman maintain her Multiple Vitamins-Minerals (MULTIVITAL PO), pantoprazole, venlafaxine XR, and conjugated estrogens.  Meds ordered this encounter  Medications  . conjugated estrogens (PREMARIN) vaginal cream    Sig: Place 1 Applicatorful vaginally at bedtime as needed.    Dispense:  42.5 g    Refill:  3     Penni Homans, MD

## 2017-10-25 NOTE — Assessment & Plan Note (Signed)
hgba1c acceptable, minimize simple carbs. Increase exercise as tolerated.  

## 2017-12-28 ENCOUNTER — Other Ambulatory Visit: Payer: Self-pay | Admitting: Family Medicine

## 2018-02-16 ENCOUNTER — Ambulatory Visit: Payer: Self-pay | Admitting: *Deleted

## 2018-02-16 NOTE — Telephone Encounter (Signed)
Summary: Doubling Medication   Pt would like to double up on her venlafaxine XR (EFFEXOR-XR) 75 MG 24 hr capsule for the next 30-60 days due to family issues and would like to know what to do to make this happen. Please advise.

## 2018-02-16 NOTE — Telephone Encounter (Signed)
It is fine to double up on the Venlafaxine to 150 mg daily for a couple months please send in rx for 75 mg tabs, 2 tabs po daily disp #60 with 3 rf

## 2018-02-16 NOTE — Telephone Encounter (Signed)
Pt would like to double up on her venlafaxine XR (EFFEXOR-XR) 75 MG 24 hr capsule for the next 30-60 days due to family issues. States new stressors: "Death of a few friends, my daughters mental health issues." States she has "Doubled up in the past which was helpful." Also states "February has always been a rough month for me." Questioning if Dr. Charlett Blake agreeable to change in dosage.    Please advise: [phone]   Reason for Disposition . Caller has NON-URGENT medication question about med that PCP prescribed and triager unable to answer question  Answer Assessment - Initial Assessment Questions 1. SYMPTOMS: "Do you have any symptoms?"     New stressors 2. SEVERITY: If symptoms are present, ask "Are they mild, moderate or severe?"  Protocols used: MEDICATION QUESTION CALL-A-AH

## 2018-02-21 ENCOUNTER — Encounter: Payer: Self-pay | Admitting: Family Medicine

## 2018-02-23 MED ORDER — VENLAFAXINE HCL ER 75 MG PO CP24: 75.0000 mg | ORAL_CAPSULE | Freq: Two times a day (BID) | ORAL | 3 refills | Status: DC

## 2018-02-23 NOTE — Addendum Note (Signed)
Addended by: Magdalene Molly A on: 02/23/2018 08:27 AM   Modules accepted: Orders

## 2018-02-23 NOTE — Telephone Encounter (Signed)
Medication sent in and updated  Called patient unable to leave voicemail

## 2018-03-05 ENCOUNTER — Telehealth: Payer: Self-pay

## 2018-03-05 NOTE — Telephone Encounter (Signed)
PA initiated via Covermymeds; KEY: [id number]. Awaiting determination.

## 2018-03-05 NOTE — Addendum Note (Signed)
Addended by: Lawana Chambers on: 03/05/2018 04:33 PM   Modules accepted: Orders

## 2018-03-08 NOTE — Telephone Encounter (Signed)
PA approved. Effective 01/13/2018 to 01/13/2019.    

## 2018-03-15 DIAGNOSIS — R69 Illness, unspecified: Secondary | ICD-10-CM | POA: Diagnosis not present

## 2018-03-30 ENCOUNTER — Encounter: Payer: Self-pay | Admitting: Family Medicine

## 2018-03-31 ENCOUNTER — Encounter: Payer: Self-pay | Admitting: Family Medicine

## 2018-04-14 ENCOUNTER — Other Ambulatory Visit: Payer: BLUE CROSS/BLUE SHIELD

## 2018-04-14 ENCOUNTER — Ambulatory Visit: Payer: BLUE CROSS/BLUE SHIELD

## 2018-05-25 DIAGNOSIS — Z20828 Contact with and (suspected) exposure to other viral communicable diseases: Secondary | ICD-10-CM | POA: Diagnosis not present

## 2018-06-01 ENCOUNTER — Other Ambulatory Visit (HOSPITAL_BASED_OUTPATIENT_CLINIC_OR_DEPARTMENT_OTHER): Payer: Self-pay | Admitting: Family Medicine

## 2018-06-01 DIAGNOSIS — Z1231 Encounter for screening mammogram for malignant neoplasm of breast: Secondary | ICD-10-CM

## 2018-06-02 ENCOUNTER — Inpatient Hospital Stay (HOSPITAL_BASED_OUTPATIENT_CLINIC_OR_DEPARTMENT_OTHER): Admission: RE | Admit: 2018-06-02 | Payer: Medicare HMO | Source: Ambulatory Visit

## 2018-06-02 ENCOUNTER — Other Ambulatory Visit (INDEPENDENT_AMBULATORY_CARE_PROVIDER_SITE_OTHER): Payer: Medicare HMO

## 2018-06-02 ENCOUNTER — Other Ambulatory Visit: Payer: Self-pay

## 2018-06-02 DIAGNOSIS — E559 Vitamin D deficiency, unspecified: Secondary | ICD-10-CM | POA: Diagnosis not present

## 2018-06-02 DIAGNOSIS — R739 Hyperglycemia, unspecified: Secondary | ICD-10-CM | POA: Diagnosis not present

## 2018-06-02 DIAGNOSIS — E785 Hyperlipidemia, unspecified: Secondary | ICD-10-CM

## 2018-06-02 DIAGNOSIS — K219 Gastro-esophageal reflux disease without esophagitis: Secondary | ICD-10-CM

## 2018-06-02 DIAGNOSIS — K449 Diaphragmatic hernia without obstruction or gangrene: Secondary | ICD-10-CM | POA: Diagnosis not present

## 2018-06-02 LAB — CBC
HCT: 42.3 % (ref 36.0–46.0)
Hemoglobin: 14.3 g/dL (ref 12.0–15.0)
MCHC: 33.7 g/dL (ref 30.0–36.0)
MCV: 97.9 fl (ref 78.0–100.0)
Platelets: 371 10*3/uL (ref 150.0–400.0)
RBC: 4.32 Mil/uL (ref 3.87–5.11)
RDW: 13.8 % (ref 11.5–15.5)
WBC: 5.3 10*3/uL (ref 4.0–10.5)

## 2018-06-02 LAB — COMPREHENSIVE METABOLIC PANEL
ALT: 17 U/L (ref 0–35)
AST: 18 U/L (ref 0–37)
Albumin: 4.3 g/dL (ref 3.5–5.2)
Alkaline Phosphatase: 80 U/L (ref 39–117)
BUN: 20 mg/dL (ref 6–23)
CO2: 33 mEq/L — ABNORMAL HIGH (ref 19–32)
Calcium: 9.4 mg/dL (ref 8.4–10.5)
Chloride: 100 mEq/L (ref 96–112)
Creatinine, Ser: 0.86 mg/dL (ref 0.40–1.20)
GFR: 66.16 mL/min (ref >60.00)
Glucose, Bld: 94 mg/dL (ref 70–99)
Potassium: 4.9 mEq/L (ref 3.5–5.1)
Sodium: 139 mEq/L (ref 135–145)
Total Bilirubin: 0.5 mg/dL (ref 0.2–1.2)
Total Protein: 7 g/dL (ref 6.0–8.3)

## 2018-06-02 LAB — HEMOGLOBIN A1C: Hgb A1c MFr Bld: 6.1 % (ref 4.6–6.5)

## 2018-06-02 LAB — LIPID PANEL
Cholesterol: 255 mg/dL — ABNORMAL HIGH (ref 0–200)
HDL: 81.8 mg/dL (ref >39.00)
LDL Cholesterol: 160 mg/dL — ABNORMAL HIGH (ref 0–99)
NonHDL: 173.48
Total CHOL/HDL Ratio: 3
Triglycerides: 66 mg/dL (ref 0.0–149.0)
VLDL: 13.2 mg/dL (ref 0.0–40.0)

## 2018-06-02 LAB — VITAMIN D 25 HYDROXY (VIT D DEFICIENCY, FRACTURES): VITD: 35.79 ng/mL (ref 30.00–100.00)

## 2018-06-02 LAB — TSH: TSH: 1.68 u[IU]/mL (ref 0.35–4.50)

## 2018-06-21 ENCOUNTER — Encounter: Payer: Self-pay | Admitting: Family Medicine

## 2018-06-22 ENCOUNTER — Ambulatory Visit (INDEPENDENT_AMBULATORY_CARE_PROVIDER_SITE_OTHER): Payer: Medicare HMO | Admitting: Family Medicine

## 2018-06-22 ENCOUNTER — Other Ambulatory Visit: Payer: Self-pay

## 2018-06-22 DIAGNOSIS — K219 Gastro-esophageal reflux disease without esophagitis: Secondary | ICD-10-CM

## 2018-06-22 DIAGNOSIS — R739 Hyperglycemia, unspecified: Secondary | ICD-10-CM

## 2018-06-22 DIAGNOSIS — R69 Illness, unspecified: Secondary | ICD-10-CM | POA: Diagnosis not present

## 2018-06-22 DIAGNOSIS — K449 Diaphragmatic hernia without obstruction or gangrene: Secondary | ICD-10-CM

## 2018-06-22 DIAGNOSIS — E559 Vitamin D deficiency, unspecified: Secondary | ICD-10-CM | POA: Diagnosis not present

## 2018-06-22 DIAGNOSIS — E785 Hyperlipidemia, unspecified: Secondary | ICD-10-CM

## 2018-06-22 DIAGNOSIS — F339 Major depressive disorder, recurrent, unspecified: Secondary | ICD-10-CM

## 2018-06-22 NOTE — Assessment & Plan Note (Signed)
Avoid offending foods, start probiotics. Do not eat large meals in late evening and consider raising head of bed. Doing well on Pantoprazole

## 2018-06-22 NOTE — Assessment & Plan Note (Signed)
Encouraged heart healthy diet, increase exercise, avoid trans fats, consider a krill oil cap daily. Patient hesitant to consider a statin

## 2018-06-22 NOTE — Assessment & Plan Note (Signed)
hgba1c acceptable, minimize simple carbs. Increase exercise as tolerated.  

## 2018-06-22 NOTE — Assessment & Plan Note (Signed)
Supplement and monitor 

## 2018-06-22 NOTE — Assessment & Plan Note (Signed)
Doing well on Venlafaxine, no changes

## 2018-06-27 NOTE — Progress Notes (Signed)
Virtual Visit via Video Note  I connected with Lauren [name] on 06/27/18 at  2:40 PM EDT by a video enabled telemedicine application and verified that I am speaking with the correct person using two identifiers.  Location: Patient: home Provider: home   I discussed the limitations of evaluation and management by telemedicine and the availability of in person appointments. The patient expressed understanding and agreed to proceed.    Subjective:    Patient ID: Lauren [name], female    DOB: Jan 08, 1956, 63 y.o.   MRN: [id number]  No chief complaint on file.   HPI Patient is in today for follow up for hyperlipidemia, hyperglycemia, depression, reflux and more. She is doing well and offers no acute concerns. No recent febrile illness or hospitalizations. Denies CP/palp/SOB/HA/congestion/fevers or GU c/o. Taking meds as prescribed  Past Medical History:  Diagnosis Date  . 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 Chicken pox as a child  . Depression    been on antidepressants on and off for 20 yr  . GERD (gastroesophageal reflux disease)    silent with hiatal hernia  . Hiatal hernia 05/22/2013  . Hiatal hernia with gastroesophageal reflux 05/22/2013  . Hot tub folliculitis 08/19/5782  . Hyperglycemia 05/19/2016  . Hyperlipidemia 05/19/2016  . Measles as a child  . Postmenopausal atrophic vaginitis 05/22/2013  . Preventative health care 01/19/2015  . Shingles 63 yrs old  . Unspecified vitamin D deficiency 05/22/2013    Past Surgical History:  Procedure Laterality Date  . ABDOMINAL HYSTERECTOMY  1998   partial-still has ovaries  . ESOPHAGECTOMY  05-2008  . INDUCED ABORTION  1979    Family History  Problem Relation Age of Onset  . Cancer Mother 67       breast cancer  . Emphysema Mother        smoker  . Heart attack Father        X several  . Mental illness Brother        bipolar, xanax and thc abuse  . Obesity Brother   . Depression Daughter   . Anxiety disorder Daughter    . Other Maternal Grandmother        blood clot, dvt, lung  . Cancer Paternal Grandmother        stomach  . Heart disease Paternal Grandfather     Social History   Socioeconomic History  . Marital status: Married    Spouse name: [name]  . Number of children: Not on file  . Years of education: Not on file  . Highest education level: Not on file  Occupational History    Employer: [employer]    Comment: Event Planner  Social Needs  . Financial resource strain: Not on file  . Food insecurity    Worry: Not on file    Inability: Not on file  . Transportation needs    Medical: Not on file    Non-medical: Not on file  Tobacco Use  . Smoking status: Former Smoker    Packs/day: 0.10    Years: 25.00    Pack years: 2.50    Types: Cigarettes    Start date: 01/13/1998  . Smokeless tobacco: Never Used  . Tobacco comment: 1-2 cigarettes a day  Substance and Sexual Activity  . Alcohol use: Yes    Comment: a couple glasses of wine every other day  . Drug use: No  . Sexual activity: Yes    Partners: Male  Comment: lives with husband, event planner, no dietary restrictions  Lifestyle  . Physical activity    Days per week: Not on file    Minutes per session: Not on file  . Stress: Not on file  Relationships  . Social Herbalist on phone: Not on file    Gets together: Not on file    Attends religious service: Not on file    Active member of club or organization: Not on file    Attends meetings of clubs or organizations: Not on file    Relationship status: Not on file  . Intimate partner violence    Fear of current or ex partner: Not on file    Emotionally abused: Not on file    Physically abused: Not on file    Forced sexual activity: Not on file  Other Topics Concern  . 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 conjugated estrogens  (PREMARIN) vaginal cream Place 1 Applicatorful vaginally at bedtime as needed. 42.5 g 3  . Multiple Vitamins-Minerals (MULTIVITAL PO) Take by mouth daily. liquid    . pantoprazole (PROTONIX) 40 MG tablet Take 1 tablet (40 mg total) by mouth daily. 90 tablet 1  . venlafaxine XR (EFFEXOR-XR) 75 MG 24 hr capsule Take 1 capsule (75 mg total) by mouth 2 (two) times daily. 60 capsule 3   No facility-administered medications prior to visit.     Allergies  Allergen Reactions  . Penicillins Hives    Review of Systems  Constitutional: Negative for fever and malaise/fatigue.  HENT: Negative for congestion.   Eyes: Negative for blurred vision.  Respiratory: Negative for shortness of breath.   Cardiovascular: Negative for chest pain, palpitations and leg swelling.  Gastrointestinal: Negative for abdominal pain, blood in stool and nausea.  Genitourinary: Negative for dysuria and frequency.  Musculoskeletal: Negative for falls.  Skin: Negative for rash.  Neurological: Negative for dizziness, loss of consciousness and headaches.  Endo/Heme/Allergies: Negative for environmental allergies.  Psychiatric/Behavioral: Negative for depression. The patient is not nervous/anxious.        Objective:    Physical Exam Constitutional:      Appearance: Normal appearance. She is not ill-appearing.  HENT:     Head: Normocephalic and atraumatic.     Nose: Nose normal.  Eyes:     General:        Right eye: No discharge.        Left eye: No discharge.  Pulmonary:     Effort: Pulmonary effort is normal.  Neurological:     Mental Status: She is alert and oriented to person, place, and time.  Psychiatric:        Mood and Affect: Mood normal.        Behavior: Behavior normal.     There were no vitals taken for this visit. Wt Readings from Last 3 Encounters:  10/20/17 122 lb 9.6 oz (55.6 kg)  04/20/17 120 lb 6.4 oz (54.6 kg)  05/19/16 120 lb 6.4 oz (54.6 kg)    Diabetic Foot Exam - Simple   No  data filed     Lab Results  Component Value Date   WBC 5.3 06/02/2018   HGB 14.3 06/02/2018   HCT 42.3 06/02/2018   PLT 371.0 06/02/2018   GLUCOSE 94 06/02/2018   CHOL 255 (H) 06/02/2018   TRIG 66.0 06/02/2018  HDL 81.80 06/02/2018   LDLCALC 160 (H) 06/02/2018   ALT 17 06/02/2018   AST 18 06/02/2018   NA 139 06/02/2018   K 4.9 06/02/2018   CL 100 06/02/2018   CREATININE 0.86 06/02/2018   BUN 20 06/02/2018   CO2 33 (H) 06/02/2018   TSH 1.68 06/02/2018   INR 1.0 05/04/2008   HGBA1C 6.1 06/02/2018    Lab Results  Component Value Date   TSH 1.68 06/02/2018   Lab Results  Component Value Date   WBC 5.3 06/02/2018   HGB 14.3 06/02/2018   HCT 42.3 06/02/2018   MCV 97.9 06/02/2018   PLT 371.0 06/02/2018   Lab Results  Component Value Date   NA 139 06/02/2018   K 4.9 06/02/2018   CO2 33 (H) 06/02/2018   GLUCOSE 94 06/02/2018   BUN 20 06/02/2018   CREATININE 0.86 06/02/2018   BILITOT 0.5 06/02/2018   ALKPHOS 80 06/02/2018   AST 18 06/02/2018   ALT 17 06/02/2018   PROT 7.0 06/02/2018   ALBUMIN 4.3 06/02/2018   CALCIUM 9.4 06/02/2018   GFR 66.16 06/02/2018   Lab Results  Component Value Date   CHOL 255 (H) 06/02/2018   Lab Results  Component Value Date   HDL 81.80 06/02/2018   Lab Results  Component Value Date   LDLCALC 160 (H) 06/02/2018   Lab Results  Component Value Date   TRIG 66.0 06/02/2018   Lab Results  Component Value Date   CHOLHDL 3 06/02/2018   Lab Results  Component Value Date   HGBA1C 6.1 06/02/2018       Assessment & Plan:   Problem List Items Addressed This Visit    Depression    Doing well on Venlafaxine, no changes      Hiatal hernia with gastroesophageal reflux    Avoid offending foods, start probiotics. Do not eat large meals in late evening and consider raising head of bed. Doing well on Pantoprazole      Vitamin D deficiency    Supplement and monitor      Hyperglycemia    hgba1c acceptable, minimize simple  carbs. Increase exercise as tolerated.       Hyperlipidemia    Encouraged heart healthy diet, increase exercise, avoid trans fats, consider a krill oil cap daily. Patient hesitant to consider a statin         I am having Larena Glassman maintain her Multiple Vitamins-Minerals (MULTIVITAL PO), pantoprazole, conjugated estrogens, and venlafaxine XR.  No orders of the defined types were placed in this encounter.    I discussed the assessment and treatment plan with the patient. The patient was provided an opportunity to ask questions and all were answered. The patient agreed with the plan and demonstrated an understanding of the instructions.   The patient was advised to call back or seek an in-person evaluation if the symptoms worsen or if the condition fails to improve as anticipated.  I provided 15 minutes of non-face-to-face time during this encounter.   Penni Homans, MD

## 2018-09-21 DIAGNOSIS — R69 Illness, unspecified: Secondary | ICD-10-CM | POA: Diagnosis not present

## 2018-09-22 ENCOUNTER — Other Ambulatory Visit: Payer: Self-pay | Admitting: Family Medicine

## 2018-09-22 ENCOUNTER — Telehealth: Payer: Self-pay | Admitting: Family Medicine

## 2018-09-22 DIAGNOSIS — Z1211 Encounter for screening for malignant neoplasm of colon: Secondary | ICD-10-CM

## 2018-09-22 DIAGNOSIS — C801 Malignant (primary) neoplasm, unspecified: Secondary | ICD-10-CM

## 2018-09-22 NOTE — Telephone Encounter (Signed)
Pt stating she has not had colonoscopy/endoscopy in about 13 years. She is a survivor of esophogeal cancer and would like referred back to Dr. Juanita Craver if she can have both at the same time. If they cannot do both she may only do Cologuard.

## 2018-09-22 NOTE — Telephone Encounter (Signed)
Diamond for referral back to Dr. Collene Mares?

## 2018-09-22 NOTE — Progress Notes (Unsigned)
amb ref °

## 2018-09-22 NOTE — Telephone Encounter (Signed)
I placed the referral and the fact that the patient prefers upper and lower endoscopy on the same day but that will have to be decided with gastroenterology and her insurance company. I cannot make them do the procedures simultaneously

## 2018-09-23 ENCOUNTER — Telehealth: Payer: Self-pay | Admitting: General Practice

## 2018-09-23 NOTE — Telephone Encounter (Signed)
Error

## 2018-09-23 NOTE — Telephone Encounter (Signed)
Patient notified of PCP recommendations and is agreement and expresses an understanding.   Ok for PEC to Discuss results / PCP recommendations / Schedule patient.   

## 2018-09-27 DIAGNOSIS — R69 Illness, unspecified: Secondary | ICD-10-CM | POA: Diagnosis not present

## 2018-09-28 ENCOUNTER — Ambulatory Visit: Payer: Medicare HMO | Admitting: Family Medicine

## 2018-09-29 ENCOUNTER — Encounter: Payer: Self-pay | Admitting: Family Medicine

## 2018-09-29 ENCOUNTER — Telehealth: Payer: Self-pay | Admitting: *Deleted

## 2018-09-29 NOTE — Telephone Encounter (Signed)
Copied from [place] [id number]. Topic: Quick Communication - See Telephone Encounter >> Sep 27, 2018  9:31 AM Loma Boston wrote: CRM for notification. See Telephone encounter for: 09/27/18.pantoprazole (PROTONIX) 40 MG tablet [id number]  pt will need a call back wants to start taking this medication again

## 2018-09-30 ENCOUNTER — Other Ambulatory Visit: Payer: Self-pay | Admitting: Family Medicine

## 2018-09-30 MED ORDER — PANTOPRAZOLE SODIUM 40 MG PO TBEC: 40.0000 mg | DELAYED_RELEASE_TABLET | Freq: Every day | ORAL | 3 refills | Status: DC

## 2018-09-30 NOTE — Telephone Encounter (Signed)
Please advise 

## 2018-09-30 NOTE — Telephone Encounter (Signed)
Looks like a 90 day supply with 3 rf was sent in today. Let her know

## 2018-10-11 ENCOUNTER — Encounter: Payer: Self-pay | Admitting: Family Medicine

## 2018-10-11 ENCOUNTER — Other Ambulatory Visit: Payer: Self-pay

## 2018-10-11 ENCOUNTER — Encounter (HOSPITAL_BASED_OUTPATIENT_CLINIC_OR_DEPARTMENT_OTHER): Payer: Self-pay

## 2018-10-11 ENCOUNTER — Ambulatory Visit (HOSPITAL_BASED_OUTPATIENT_CLINIC_OR_DEPARTMENT_OTHER)
Admission: RE | Admit: 2018-10-11 | Discharge: 2018-10-11 | Disposition: A | Discharge status: Home / Self Care | Payer: Medicare HMO | Source: Ambulatory Visit | Attending: Family Medicine | Admitting: Family Medicine

## 2018-10-11 ENCOUNTER — Ambulatory Visit (INDEPENDENT_AMBULATORY_CARE_PROVIDER_SITE_OTHER): Payer: Medicare HMO | Admitting: Family Medicine

## 2018-10-11 VITALS — BP 120/70 | HR 73 | Temp 96.3°F | Resp 18 | Ht 64.0 in | Wt 124.0 lb

## 2018-10-11 DIAGNOSIS — Z8619 Personal history of other infectious and parasitic diseases: Secondary | ICD-10-CM | POA: Diagnosis not present

## 2018-10-11 DIAGNOSIS — E559 Vitamin D deficiency, unspecified: Secondary | ICD-10-CM | POA: Diagnosis not present

## 2018-10-11 DIAGNOSIS — R739 Hyperglycemia, unspecified: Secondary | ICD-10-CM | POA: Diagnosis not present

## 2018-10-11 DIAGNOSIS — E782 Mixed hyperlipidemia: Secondary | ICD-10-CM

## 2018-10-11 DIAGNOSIS — K449 Diaphragmatic hernia without obstruction or gangrene: Secondary | ICD-10-CM

## 2018-10-11 DIAGNOSIS — K219 Gastro-esophageal reflux disease without esophagitis: Secondary | ICD-10-CM

## 2018-10-11 DIAGNOSIS — Z23 Encounter for immunization: Secondary | ICD-10-CM

## 2018-10-11 DIAGNOSIS — Z1231 Encounter for screening mammogram for malignant neoplasm of breast: Secondary | ICD-10-CM | POA: Diagnosis not present

## 2018-10-11 LAB — CBC
HCT: 44.5 % (ref 36.0–46.0)
Hemoglobin: 14.7 g/dL (ref 12.0–15.0)
MCHC: 33.1 g/dL (ref 30.0–36.0)
MCV: 97.6 fl (ref 78.0–100.0)
Platelets: 304 10*3/uL (ref 150.0–400.0)
RBC: 4.56 Mil/uL (ref 3.87–5.11)
RDW: 14.5 % (ref 11.5–15.5)
WBC: 6.2 10*3/uL (ref 4.0–10.5)

## 2018-10-11 LAB — TSH: TSH: 1.48 u[IU]/mL (ref 0.35–4.50)

## 2018-10-11 LAB — VITAMIN D 25 HYDROXY (VIT D DEFICIENCY, FRACTURES): VITD: 40.65 ng/mL (ref 30.00–100.00)

## 2018-10-11 LAB — LIPID PANEL
Cholesterol: 253 mg/dL — ABNORMAL HIGH (ref 0–200)
HDL: 88.8 mg/dL (ref >39.00)
LDL Cholesterol: 143 mg/dL — ABNORMAL HIGH (ref 0–99)
NonHDL: 164.4
Total CHOL/HDL Ratio: 3
Triglycerides: 105 mg/dL (ref 0.0–149.0)
VLDL: 21 mg/dL (ref 0.0–40.0)

## 2018-10-11 LAB — COMPREHENSIVE METABOLIC PANEL
ALT: 12 U/L (ref 0–35)
AST: 16 U/L (ref 0–37)
Albumin: 4.4 g/dL (ref 3.5–5.2)
Alkaline Phosphatase: 71 U/L (ref 39–117)
BUN: 14 mg/dL (ref 6–23)
CO2: 32 mEq/L (ref 19–32)
Calcium: 9.9 mg/dL (ref 8.4–10.5)
Chloride: 101 mEq/L (ref 96–112)
Creatinine, Ser: 0.79 mg/dL (ref 0.40–1.20)
GFR: 72.89 mL/min (ref >60.00)
Glucose, Bld: 95 mg/dL (ref 70–99)
Potassium: 5.1 mEq/L (ref 3.5–5.1)
Sodium: 141 mEq/L (ref 135–145)
Total Bilirubin: 0.4 mg/dL (ref 0.2–1.2)
Total Protein: 7 g/dL (ref 6.0–8.3)

## 2018-10-11 LAB — HEMOGLOBIN A1C: Hgb A1c MFr Bld: 6.2 % (ref 4.6–6.5)

## 2018-10-11 NOTE — Progress Notes (Signed)
Subjective:    Patient ID: Lauren [name], female    DOB: 08-05-1955, 63 y.o.   MRN: [id number]  No chief complaint on file.   HPI Patient is in today for follow up on hiatal hernia with reflux, hyperglycemia and more. No recent febrile illness or hospitalizations. Is will to take the Shingles for shots. Denies CP/palp/SOB/HA/congestion/fevers/GI or GU c/o. Taking meds as prescribed  Past Medical History:  Diagnosis Date  . 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 Chicken pox as a child  . Depression    been on antidepressants on and off for 20 yr  . GERD (gastroesophageal reflux disease)    silent with hiatal hernia  . Hiatal hernia 05/22/2013  . Hiatal hernia with gastroesophageal reflux 05/22/2013  . Hot tub folliculitis [id number]  . Hyperglycemia 05/19/2016  . Hyperlipidemia 05/19/2016  . Measles as a child  . Postmenopausal atrophic vaginitis 05/22/2013  . Preventative health care 01/19/2015  . Shingles 63 yrs old  . Unspecified vitamin D deficiency 05/22/2013    Past Surgical History:  Procedure Laterality Date  . ABDOMINAL HYSTERECTOMY  1998   partial-still has ovaries  . ESOPHAGECTOMY  05-2008  . INDUCED ABORTION  1979    Family History  Problem Relation Age of Onset  . Cancer Mother 45       breast cancer  . Emphysema Mother        smoker  . Heart attack Father        X several  . Mental illness Brother        bipolar, xanax and thc abuse  . Obesity Brother   . Depression Daughter   . Anxiety disorder Daughter   . Other Maternal Grandmother        blood clot, dvt, lung  . Cancer Paternal Grandmother        stomach  . Heart disease Paternal Grandfather     Social History   Socioeconomic History  . Marital status: Married    Spouse name: [name]  . Number of children: Not on file  . Years of education: Not on file  . Highest education level: Not on file  Occupational History    Employer: [employer]    Comment: Event Planner  Social Needs   . Financial resource strain: Not on file  . Food insecurity    Worry: Not on file    Inability: Not on file  . Transportation needs    Medical: Not on file    Non-medical: Not on file  Tobacco Use  . Smoking status: Former Smoker    Packs/day: 0.10    Years: 25.00    Pack years: 2.50    Types: Cigarettes    Start date: 01/13/1998  . Smokeless tobacco: Never Used  . Tobacco comment: 1-2 cigarettes a day  Substance and Sexual Activity  . Alcohol use: Yes    Comment: a couple glasses of wine every other day  . Drug use: No  . Sexual activity: Yes    Partners: Male    Comment: lives with husband, event planner, no dietary restrictions  Lifestyle  . Physical activity    Days per week: Not on file    Minutes per session: Not on file  . Stress: Not on file  Relationships  . Social Herbalist on phone: Not on file    Gets together: Not on file    Attends religious service: Not on file  Active member of club or organization: Not on file    Attends meetings of clubs or organizations: Not on file    Relationship status: Not on file  . Intimate partner violence    Fear of current or ex partner: Not on file    Emotionally abused: Not on file    Physically abused: Not on file    Forced sexual activity: Not on file  Other Topics Concern  . 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 conjugated estrogens (PREMARIN) vaginal cream Place 1 Applicatorful vaginally at bedtime as needed. 42.5 g 3  . Multiple Vitamins-Minerals (MULTIVITAL PO) Take by mouth daily. liquid    . pantoprazole (PROTONIX) 40 MG tablet Take 1 tablet (40 mg total) by mouth daily. 90 tablet 3  . venlafaxine XR (EFFEXOR-XR) 75 MG 24 hr capsule Take 1 capsule (75 mg total) by mouth 2 (two) times daily. 60 capsule 3   No facility-administered medications prior to visit.     Allergies   Allergen Reactions  . Penicillins Hives    Review of Systems  Constitutional: Negative for fever and malaise/fatigue.  HENT: Negative for congestion.   Eyes: Negative for blurred vision.  Respiratory: Negative for shortness of breath.   Cardiovascular: Negative for chest pain, palpitations and leg swelling.  Gastrointestinal: Negative for abdominal pain, blood in stool and nausea.  Genitourinary: Negative for dysuria and frequency.  Musculoskeletal: Negative for falls.  Skin: Negative for rash.  Neurological: Negative for dizziness, loss of consciousness and headaches.  Endo/Heme/Allergies: Negative for environmental allergies.  Psychiatric/Behavioral: Negative for depression. The patient is not nervous/anxious.        Objective:    Physical Exam  BP 120/70 (BP Location: Left Arm, Patient Position: Sitting, Cuff Size: Normal)   Pulse 73   Temp (!) 96.3 F (35.7 C) (Temporal)   Resp 18   Ht 5\' 4"  (1.626 m)   Wt 124 lb (56.2 kg)   SpO2 98%   BMI 21.28 kg/m  Wt Readings from Last 3 Encounters:  10/11/18 124 lb (56.2 kg)  10/20/17 122 lb 9.6 oz (55.6 kg)  04/20/17 120 lb 6.4 oz (54.6 kg)    Diabetic Foot Exam - Simple   No data filed     Lab Results  Component Value Date   WBC 5.3 06/02/2018   HGB 14.3 06/02/2018   HCT 42.3 06/02/2018   PLT 371.0 06/02/2018   GLUCOSE 94 06/02/2018   CHOL 255 (H) 06/02/2018   TRIG 66.0 06/02/2018   HDL 81.80 06/02/2018   LDLCALC 160 (H) 06/02/2018   ALT 17 06/02/2018   AST 18 06/02/2018   NA 139 06/02/2018   K 4.9 06/02/2018   CL 100 06/02/2018   CREATININE 0.86 06/02/2018   BUN 20 06/02/2018   CO2 33 (H) 06/02/2018   TSH 1.68 06/02/2018   INR 1.0 05/04/2008   HGBA1C 6.1 06/02/2018    Lab Results  Component Value Date   TSH 1.68 06/02/2018   Lab Results  Component Value Date   WBC 5.3 06/02/2018   HGB 14.3 06/02/2018   HCT 42.3 06/02/2018   MCV 97.9 06/02/2018   PLT 371.0 06/02/2018   Lab Results  Component  Value Date   NA 139 06/02/2018   K 4.9 06/02/2018   CO2 33 (H) 06/02/2018   GLUCOSE 94 06/02/2018   BUN  20 06/02/2018   CREATININE 0.86 06/02/2018   BILITOT 0.5 06/02/2018   ALKPHOS 80 06/02/2018   AST 18 06/02/2018   ALT 17 06/02/2018   PROT 7.0 06/02/2018   ALBUMIN 4.3 06/02/2018   CALCIUM 9.4 06/02/2018   GFR 66.16 06/02/2018   Lab Results  Component Value Date   CHOL 255 (H) 06/02/2018   Lab Results  Component Value Date   HDL 81.80 06/02/2018   Lab Results  Component Value Date   LDLCALC 160 (H) 06/02/2018   Lab Results  Component Value Date   TRIG 66.0 06/02/2018   Lab Results  Component Value Date   CHOLHDL 3 06/02/2018   Lab Results  Component Value Date   HGBA1C 6.1 06/02/2018       Assessment & Plan:   Problem List Items Addressed This Visit    History of shingles    Given shingles shot      Hiatal hernia with gastroesophageal reflux    Has been referred back to her gastroenterologist, Dr Collene Mares, patient is hoping to have upper and lower endoscopy at same time as needed      Vitamin D deficiency    Supplement and monitor      Relevant Orders   VITAMIN D 25 Hydroxy (Vit-D Deficiency, Fractures)   Hyperglycemia    hgba1c acceptable, minimize simple carbs. Increase exercise as tolerated. Continue current meds      Relevant Orders   CBC   Comprehensive metabolic panel   TSH   Hemoglobin A1c   Hyperlipidemia    Encouraged heart healthy diet, increase exercise, avoid trans fats, consider a krill oil cap daily      Relevant Orders   Lipid panel    Other Visit Diagnoses    Need for shingles vaccine    -  Primary   Relevant Orders   Varicella-zoster vaccine IM (Shingrix) (Completed)      I am having Larena Glassman maintain her Multiple Vitamins-Minerals (MULTIVITAL PO), conjugated estrogens, venlafaxine XR, and pantoprazole.  No orders of the defined types were placed in this encounter.    Penni Homans, MD

## 2018-10-11 NOTE — Assessment & Plan Note (Signed)
Given shingles shot

## 2018-10-11 NOTE — Assessment & Plan Note (Addendum)
Has been referred back to her gastroenterologist, Dr Collene Mares, patient is hoping to have upper and lower endoscopy at same time as needed

## 2018-10-11 NOTE — Assessment & Plan Note (Signed)
Encouraged heart healthy diet, increase exercise, avoid trans fats, consider a krill oil cap daily 

## 2018-10-11 NOTE — Patient Instructions (Addendum)
[place] thermometer Pulse oximeter want oxygen in the 90s  Vitals weekly  Call Dr Collene Mares you were referred on 09/22/18  High Cholesterol  High cholesterol is a condition in which the blood has high levels of a white, waxy, fat-like substance (cholesterol). The human body needs small amounts of cholesterol. The liver makes all the cholesterol that the body needs. Extra (excess) cholesterol comes from the food that we eat. Cholesterol is carried from the liver by the blood through the blood vessels. If you have high cholesterol, deposits (plaques) may build up on the walls of your blood vessels (arteries). Plaques make the arteries narrower and stiffer. Cholesterol plaques increase your risk for heart attack and stroke. Work with your health care provider to keep your cholesterol levels in a healthy range. What increases the risk? This condition is more likely to develop in people who:  Eat foods that are high in animal fat (saturated fat) or cholesterol.  Are overweight.  Are not getting enough exercise.  Have a family history of high cholesterol. What are the signs or symptoms? There are no symptoms of this condition. How is this diagnosed? This condition may be diagnosed from the results of a blood test.  If you are older than age 29, your health care provider may check your cholesterol every 4-6 years.  You may be checked more often if you already have high cholesterol or other risk factors for heart disease. The blood test for cholesterol measures:  "Bad" cholesterol (LDL cholesterol). This is the main type of cholesterol that causes heart disease. The desired level for LDL is less than 100.  "Good" cholesterol (HDL cholesterol). This type helps to protect against heart disease by cleaning the arteries and carrying the LDL away. The desired level for HDL is 60 or higher.  Triglycerides. These are fats that the body can store or burn for energy. The desired number for triglycerides is  lower than 150.  Total cholesterol. This is a measure of the total amount of cholesterol in your blood, including LDL cholesterol, HDL cholesterol, and triglycerides. A healthy number is less than 200. How is this treated? This condition is treated with diet changes, lifestyle changes, and medicines. Diet changes  This may include eating more whole grains, fruits, vegetables, nuts, and fish.  This may also include cutting back on red meat and foods that have a lot of added sugar. Lifestyle changes  Changes may include getting at least 40 minutes of aerobic exercise 3 times a week. Aerobic exercises include walking, biking, and swimming. Aerobic exercise along with a healthy diet can help you maintain a healthy weight.  Changes may also include quitting smoking. Medicines  Medicines are usually given if diet and lifestyle changes have failed to reduce your cholesterol to healthy levels.  Your health care provider may prescribe a statin medicine. Statin medicines have been shown to reduce cholesterol, which can reduce the risk of heart disease. Follow these instructions at home: Eating and drinking If told by your health care provider:  Eat chicken (without skin), fish, veal, shellfish, ground [place] breast, and round or loin cuts of red meat.  Do not eat fried foods or fatty meats, such as hot dogs and salami.  Eat plenty of fruits, such as apples.  Eat plenty of vegetables, such as broccoli, potatoes, and carrots.  Eat beans, peas, and lentils.  Eat grains such as barley, rice, couscous, and bulgur wheat.  Eat pasta without cream sauces.  Use skim or nonfat  milk, and eat low-fat or nonfat yogurt and cheeses.  Do not eat or drink whole milk, cream, ice cream, egg yolks, or hard cheeses.  Do not eat stick margarine or tub margarines that contain trans fats (also called partially hydrogenated oils).  Do not eat saturated tropical oils, such as coconut oil and palm oil.  Do  not eat cakes, cookies, crackers, or other baked goods that contain trans fats.  General instructions  Exercise as directed by your health care provider. Increase your activity level with activities such as gardening, walking, and taking the stairs.  Take over-the-counter and prescription medicines only as told by your health care provider.  Do not use any products that contain nicotine or tobacco, such as cigarettes and e-cigarettes. If you need help quitting, ask your health care provider.  Keep all follow-up visits as told by your health care provider. This is important. Contact a health care provider if:  You are struggling to maintain a healthy diet or weight.  You need help to start on an exercise program.  You need help to stop smoking. Get help right away if:  You have chest pain.  You have trouble breathing. This information is not intended to replace advice given to you by your health care provider. Make sure you discuss any questions you have with your health care provider. Document Released: 12/30/2004 Document Revised: 01/02/2017 Document Reviewed: 06/30/2015 Elsevier Patient Education  2020 [employer].

## 2018-10-11 NOTE — Assessment & Plan Note (Signed)
hgba1c acceptable, minimize simple carbs. Increase exercise as tolerated. Continue current meds 

## 2018-10-11 NOTE — Assessment & Plan Note (Signed)
Supplement and monitor 

## 2018-10-19 DIAGNOSIS — R69 Illness, unspecified: Secondary | ICD-10-CM | POA: Diagnosis not present

## 2018-10-21 DIAGNOSIS — R69 Illness, unspecified: Secondary | ICD-10-CM | POA: Diagnosis not present

## 2018-10-27 DIAGNOSIS — Z85828 Personal history of other malignant neoplasm of skin: Secondary | ICD-10-CM | POA: Diagnosis not present

## 2018-10-27 DIAGNOSIS — D225 Melanocytic nevi of trunk: Secondary | ICD-10-CM | POA: Diagnosis not present

## 2018-10-27 DIAGNOSIS — D2272 Melanocytic nevi of left lower limb, including hip: Secondary | ICD-10-CM | POA: Diagnosis not present

## 2018-10-27 DIAGNOSIS — L218 Other seborrheic dermatitis: Secondary | ICD-10-CM | POA: Diagnosis not present

## 2018-10-27 DIAGNOSIS — L814 Other melanin hyperpigmentation: Secondary | ICD-10-CM | POA: Diagnosis not present

## 2018-10-27 DIAGNOSIS — L821 Other seborrheic keratosis: Secondary | ICD-10-CM | POA: Diagnosis not present

## 2018-11-05 DIAGNOSIS — R69 Illness, unspecified: Secondary | ICD-10-CM | POA: Diagnosis not present

## 2018-11-06 DIAGNOSIS — R69 Illness, unspecified: Secondary | ICD-10-CM | POA: Diagnosis not present

## 2018-11-15 ENCOUNTER — Other Ambulatory Visit: Payer: Self-pay

## 2018-11-15 DIAGNOSIS — Z20822 Contact with and (suspected) exposure to covid-19: Secondary | ICD-10-CM

## 2018-11-17 LAB — NOVEL CORONAVIRUS, NAA: SARS-CoV-2, NAA: NOT DETECTED

## 2018-11-22 DIAGNOSIS — R69 Illness, unspecified: Secondary | ICD-10-CM | POA: Diagnosis not present

## 2018-11-29 ENCOUNTER — Other Ambulatory Visit: Payer: Self-pay | Admitting: Family Medicine

## 2018-11-30 DIAGNOSIS — R69 Illness, unspecified: Secondary | ICD-10-CM | POA: Diagnosis not present

## 2018-12-02 DIAGNOSIS — R69 Illness, unspecified: Secondary | ICD-10-CM | POA: Diagnosis not present

## 2018-12-06 DIAGNOSIS — R69 Illness, unspecified: Secondary | ICD-10-CM | POA: Diagnosis not present

## 2018-12-15 DIAGNOSIS — R69 Illness, unspecified: Secondary | ICD-10-CM | POA: Diagnosis not present

## 2019-01-01 DIAGNOSIS — R69 Illness, unspecified: Secondary | ICD-10-CM | POA: Diagnosis not present

## 2019-01-25 ENCOUNTER — Other Ambulatory Visit: Payer: Self-pay | Admitting: Family Medicine

## 2019-02-01 DIAGNOSIS — R69 Illness, unspecified: Secondary | ICD-10-CM | POA: Diagnosis not present

## 2019-02-10 ENCOUNTER — Ambulatory Visit: Payer: Medicare HMO

## 2019-02-15 DIAGNOSIS — R69 Illness, unspecified: Secondary | ICD-10-CM | POA: Diagnosis not present

## 2019-02-18 ENCOUNTER — Ambulatory Visit: Payer: Medicare HMO | Attending: Internal Medicine

## 2019-02-18 DIAGNOSIS — Z23 Encounter for immunization: Secondary | ICD-10-CM | POA: Insufficient documentation

## 2019-02-18 NOTE — Progress Notes (Signed)
   Covid-19 Vaccination Clinic  Name:  KHAMIAH BENEDICTO    MRN: [id number] DOB: 25-Aug-1955  02/18/2019  Ms. Seamans was observed post Covid-19 immunization for 15 minutes without incidence. She was provided with Vaccine Information Sheet and instruction to access the V-Safe system.   Ms. Paice was instructed to call 911 with any severe reactions post vaccine: Marland Kitchen Difficulty breathing  . Swelling of your face and throat  . A fast heartbeat  . A bad rash all over your body  . Dizziness and weakness    Immunizations Administered    Name Date Dose VIS Date Route   Pfizer COVID-19 Vaccine 02/18/2019  3:03 PM 0.3 mL 12/24/2018 Intramuscular   Manufacturer: [place]   Lot: [id number]   [place]: [id number]

## 2019-02-21 ENCOUNTER — Ambulatory Visit: Payer: Medicare HMO

## 2019-02-23 ENCOUNTER — Other Ambulatory Visit: Payer: Self-pay | Admitting: Family Medicine

## 2019-03-01 DIAGNOSIS — R69 Illness, unspecified: Secondary | ICD-10-CM | POA: Diagnosis not present

## 2019-03-15 ENCOUNTER — Ambulatory Visit: Payer: Medicare HMO | Attending: Internal Medicine

## 2019-03-15 DIAGNOSIS — Z23 Encounter for immunization: Secondary | ICD-10-CM | POA: Insufficient documentation

## 2019-03-15 NOTE — Progress Notes (Signed)
   Covid-19 Vaccination Clinic  Name:  TANETTE ARMADA    MRN: [id number] DOB: [birth year]  03/15/2019  Ms. Martinez was observed post Covid-19 immunization for 15 minutes without incident. She was provided with Vaccine Information Sheet and instruction to access the V-Safe system.   Ms. Hagadone was instructed to call 911 with any severe reactions post vaccine: Marland Kitchen Difficulty breathing  . Swelling of face and throat  . A fast heartbeat  . A bad rash all over body  . Dizziness and weakness   Immunizations Administered    Name Date Dose VIS Date Route   Pfizer COVID-19 Vaccine 03/15/2019  2:56 PM 0.3 mL 12/24/2018 Intramuscular   Manufacturer: [place]   Lot: [id number]   [place]: [id number]

## 2019-03-24 ENCOUNTER — Other Ambulatory Visit: Payer: Self-pay | Admitting: Family Medicine

## 2019-04-04 DIAGNOSIS — R69 Illness, unspecified: Secondary | ICD-10-CM | POA: Diagnosis not present

## 2019-04-06 NOTE — Progress Notes (Signed)
Subjective:   Lauren [name] is a 64 y.o. female who presents for an Initial Medicare Annual Wellness Visit.  Review of Systems    Home Safety/Smoke Alarms: Feels safe in home. Smoke alarms in place.  Lives w/ husband. Split level. Does well w/ stairs.   Female:     Mammo- 10/11/18    Dexa scan- 04/26/17       CCS- 02/22/15.     Objective:     Advanced Directives 04/07/2019  Does Patient Have a Medical Advance Directive? Yes  Type of Paramedic of [place];Living will  Does patient want to make changes to medical advance directive? No - Patient declined  Copy of [place] in Chart? No - copy requested    Current Medications (verified) Outpatient Encounter Medications as of 04/07/2019  Medication Sig  . Multiple Vitamins-Minerals (MULTIVITAL PO) Take by mouth daily. liquid  . pantoprazole (PROTONIX) 40 MG tablet Take 1 tablet (40 mg total) by mouth daily.  Marland Kitchen venlafaxine XR (EFFEXOR-XR) 150 MG 24 hr capsule TAKE 1 CAPSULE BY MOUTH EVERY DAY  . [DISCONTINUED] conjugated estrogens (PREMARIN) vaginal cream Place 1 Applicatorful vaginally at bedtime as needed.   No facility-administered encounter medications on file as of 04/07/2019.    Allergies (verified) Penicillins   History: Past Medical History:  Diagnosis Date  . 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 Chicken pox as a child  . Depression    been on antidepressants on and off for 20 yr  . GERD (gastroesophageal reflux disease)    silent with hiatal hernia  . Hiatal hernia 05/22/2013  . Hiatal hernia with gastroesophageal reflux 05/22/2013  . Hot tub folliculitis [id number]  . Hyperglycemia 05/19/2016  . Hyperlipidemia 05/19/2016  . Measles as a child  . Postmenopausal atrophic vaginitis 05/22/2013  . Preventative health care 01/19/2015  . Shingles 64 yrs old  . Unspecified vitamin D deficiency 05/22/2013   Past Surgical History:  Procedure Laterality Date  . ABDOMINAL  HYSTERECTOMY  1998   partial-still has ovaries  . ESOPHAGECTOMY  05-2008  . INDUCED ABORTION  1979   Family History  Problem Relation Age of Onset  . Cancer Mother 80       breast cancer  . Emphysema Mother        smoker  . Heart attack Father        X several  . Mental illness Brother        bipolar, xanax and thc abuse  . Obesity Brother   . Depression Daughter   . Anxiety disorder Daughter   . Other Maternal Grandmother        blood clot, dvt, lung  . Cancer Paternal Grandmother        stomach  . Heart disease Paternal Grandfather    Social History   Socioeconomic History  . Marital status: Married    Spouse name: Lauren [name]  . Number of children: Not on file  . Years of education: Not on file  . Highest education level: Not on file  Occupational History    Employer: [employer]    Comment: Event Planner  Tobacco Use  . Smoking status: Former Smoker    Packs/day: 0.10    Years: 25.00    Pack years: 2.50    Types: Cigarettes    Start date: 01/13/1998  . Smokeless tobacco: Never Used  . Tobacco comment: 1-2 cigarettes a day  Substance and Sexual Activity  .  Alcohol use: Yes    Comment: a couple glasses of wine every other day  . Drug use: No  . Sexual activity: Yes    Partners: Male    Comment: lives with husband, event planner, no dietary restrictions  Other Topics Concern  . 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 Difficulty of Paying Living Expenses: Not hard at all  Food Insecurity: No Food Insecurity  . Worried About Charity fundraiser in the Last Year: Never true  . Ran Out of Food in the Last Year: Never true  Transportation Needs: No Transportation Needs  . Lack of Transportation (Medical): No  . Lack of Transportation (Non-Medical): No  Physical Activity:   . Days of Exercise per Week:   . Minutes of Exercise per Session:     Stress:   . Feeling of Stress :   Social Connections:   . Frequency of Communication with Friends and Family:   . Frequency of Social Gatherings with Friends and Family:   . Attends Religious Services:   . Active Member of Clubs or Organizations:   . Attends Archivist Meetings:   Marland Kitchen Marital Status:     Tobacco Counseling Counseling given: Not Answered Comment: 1-2 cigarettes a day   Clinical Intake: Pain : No/denies pain    Activities of Daily Living In your present state of health, do you have any difficulty performing the following activities: 04/07/2019  Hearing? N  Vision? N  Difficulty concentrating or making decisions? N  Walking or climbing stairs? N  Dressing or bathing? N  Doing errands, shopping? N  Preparing Food and eating ? N  Using the Toilet? N  In the past six months, have you accidently leaked urine? N  Do you have problems with loss of bowel control? N  Managing your Medications? N  Managing your Finances? N  Housekeeping or managing your Housekeeping? N  Some recent data might be hidden     Immunizations and Health Maintenance Immunization History  Administered Date(s) Administered  . Influenza, High Dose Seasonal PF 09/27/2018  . Influenza,inj,Quad PF,6+ Mos 11/08/2014, 10/20/2017  . Influenza-Unspecified 11/17/2013  . PFIZER SARS-COV-2 Vaccination 02/18/2019, 03/15/2019  . Td 01/14/2003  . Tdap 01/13/2010, 06/28/2010, 11/08/2014  . Zoster 05/02/2013  . Zoster Recombinat (Shingrix) 10/11/2018   Health Maintenance Due  Topic Date Due  . PNA vac Low Risk Adult (1 of 2 - PCV13) Never done    Patient Care Team: [name], MD as PCP - General (Family Medicine)  Indicate any recent Medical Services you may have received from other than [facility] providers in the past year (date may be approximate).     Assessment:   This is a routine wellness examination for Lauren [name]. Physical assessment deferred to PCP.  Hearing/Vision  screen Unable to assess. This visit is enabled though telemedicine due to Covid 19.   Dietary issues and exercise activities discussed: Current Exercise Habits: Home exercise routine, Type of exercise: strength training/weights;yoga;walking, Time (Minutes): 30, Frequency (Times/Week): 4, Weekly Exercise (Minutes/Week): 120, Exercise limited by: None identified Diet (meal preparation, eat out, water intake, caffeinated beverages, dairy products, fruits and vegetables): in general, a "healthy" diet  , well balanced      Goals    . Patient Stated     Drink at 3 16oz bottles of water.  Depression Screen PHQ 2/9 Scores 04/07/2019  PHQ - 2 Score 0    Fall Risk Fall Risk  04/07/2019  Falls in the past year? 0  Number falls in past yr: 0  Injury with Fall? 0  Follow up Education provided;Falls prevention discussed    Cognitive Function: Ad8 score reviewed for issues:  Issues making decisions:no  Less interest in hobbies / activities:no  Repeats questions, stories (family complaining):no  Trouble using ordinary gadgets (microwave, computer, phone):no  Forgets the month or year: no  Mismanaging finances: no  Remembering appts:no  Daily problems with thinking and/or memory:no Ad8 score is=0         Screening Tests Health Maintenance  Topic Date Due  . PNA vac Low Risk Adult (1 of 2 - PCV13) Never done  . MAMMOGRAM  10/10/2020  . TETANUS/TDAP  11/07/2024  . COLONOSCOPY  02/21/2025  . INFLUENZA VACCINE  Completed  . DEXA SCAN  Completed  . Hepatitis C Screening  Completed     Plan:    Please schedule your next medicare wellness visit with me in 1 yr.  Continue to eat heart healthy diet (full of fruits, vegetables, whole grains, lean protein, water--limit salt, fat, and sugar intake) and increase physical activity as tolerated.  Continue doing brain stimulating activities (puzzles, reading, adult coloring books, staying active) to keep memory sharp.   Bring a  copy of your living will and/or healthcare power of attorney to your next office visit.   I have personally reviewed and noted the following in the patient's chart:   . Medical and social history . Use of alcohol, tobacco or illicit drugs  . Current medications and supplements . Functional ability and status . Nutritional status . Physical activity . Advanced directives . List of other physicians . Hospitalizations, surgeries, and ER visits in previous 12 months . Vitals . Screenings to include cognitive, depression, and falls . Referrals and appointments  In addition, I have reviewed and discussed with patient certain preventive protocols, quality metrics, and best practice recommendations. A written personalized care plan for preventive services as well as general preventive health recommendations were provided to patient.     Naaman Plummer [place], [place]   04/07/2019

## 2019-04-07 ENCOUNTER — Ambulatory Visit (INDEPENDENT_AMBULATORY_CARE_PROVIDER_SITE_OTHER): Payer: Medicare HMO | Admitting: *Deleted

## 2019-04-07 ENCOUNTER — Other Ambulatory Visit: Payer: Self-pay

## 2019-04-07 ENCOUNTER — Encounter: Payer: Self-pay | Admitting: *Deleted

## 2019-04-07 DIAGNOSIS — Z0000 Encounter for general adult medical examination without abnormal findings: Secondary | ICD-10-CM | POA: Diagnosis not present

## 2019-04-07 NOTE — Patient Instructions (Signed)
Please schedule your next medicare wellness visit with me in 1 yr.  Continue to eat heart healthy diet (full of fruits, vegetables, whole grains, lean protein, water--limit salt, fat, and sugar intake) and increase physical activity as tolerated.  Continue doing brain stimulating activities (puzzles, reading, adult coloring books, staying active) to keep memory sharp.   Bring a copy of your living will and/or healthcare power of attorney to your next office visit.   Lauren [name] , Thank you for taking time to come for your Medicare Wellness Visit. I appreciate your ongoing commitment to your health goals. Please review the following plan we discussed and let me know if I can assist you in the future.   These are the goals we discussed: Goals    . Patient Stated     Drink at 3 16oz bottles of water.        This is a list of the screening recommended for you and due dates:  Health Maintenance  Topic Date Due  . Pneumonia vaccines (1 of 2 - PCV13) Never done  . Mammogram  10/10/2020  . Tetanus Vaccine  11/07/2024  . Colon Cancer Screening  02/21/2025  . Flu Shot  Completed  . DEXA scan (bone density measurement)  Completed  .  Hepatitis C: One time screening is recommended by Center for Disease Control  (CDC) for  adults born from [birth year] through 1967.   Completed    Preventive Care 3 Years and Older, Female Preventive care refers to lifestyle choices and visits with your health care provider that can promote health and wellness. This includes:  A yearly physical exam. This is also called an annual well check.  Regular dental and eye exams.  Immunizations.  Screening for certain conditions.  Healthy lifestyle choices, such as diet and exercise. What can I expect for my preventive care visit? Physical exam Your health care provider will check:  Height and weight. These may be used to calculate body mass index (BMI), which is a measurement that tells if you are at a healthy  weight.  Heart rate and blood pressure.  Your skin for abnormal spots. Counseling Your health care provider may ask you questions about:  Alcohol, tobacco, and drug use.  Emotional well-being.  Home and relationship well-being.  Sexual activity.  Eating habits.  History of falls.  Memory and ability to understand (cognition).  Work and work Statistician.  Pregnancy and menstrual history. What immunizations do I need?  Influenza (flu) vaccine  This is recommended every year. Tetanus, diphtheria, and pertussis (Tdap) vaccine  You may need a Td booster every 10 years. Varicella (chickenpox) vaccine  You may need this vaccine if you have not already been vaccinated. Zoster (shingles) vaccine  You may need this after age 20. Pneumococcal conjugate (PCV13) vaccine  One dose is recommended after age 45. Pneumococcal polysaccharide (PPSV23) vaccine  One dose is recommended after age 8. Measles, mumps, and rubella (MMR) vaccine  You may need at least one dose of MMR if you were born in 1957 or later. You may also need a second dose. Meningococcal conjugate (MenACWY) vaccine  You may need this if you have certain conditions. Hepatitis A vaccine  You may need this if you have certain conditions or if you travel or work in places where you may be exposed to hepatitis A. Hepatitis B vaccine  You may need this if you have certain conditions or if you travel or work in places where you may be  exposed to hepatitis B. Haemophilus influenzae type b (Hib) vaccine  You may need this if you have certain conditions. You may receive vaccines as individual doses or as more than one vaccine together in one shot (combination vaccines). Talk with your health care provider about the risks and benefits of combination vaccines. What tests do I need? Blood tests  Lipid and cholesterol levels. These may be checked every 5 years, or more frequently depending on your overall  health.  Hepatitis C test.  Hepatitis B test. Screening  Lung cancer screening. You may have this screening every year starting at age 85 if you have a 30-pack-year history of smoking and currently smoke or have quit within the past 15 years.  Colorectal cancer screening. All adults should have this screening starting at age 60 and continuing until age 101. Your health care provider may recommend screening at age 62 if you are at increased risk. You will have tests every 1-10 years, depending on your results and the type of screening test.  Diabetes screening. This is done by checking your blood sugar (glucose) after you have not eaten for a while (fasting). You may have this done every 1-3 years.  Mammogram. This may be done every 1-2 years. Talk with your health care provider about how often you should have regular mammograms.  BRCA-related cancer screening. This may be done if you have a family history of breast, ovarian, tubal, or peritoneal cancers. Other tests  Sexually transmitted disease (STD) testing.  Bone density scan. This is done to screen for osteoporosis. You may have this done starting at age 13. Follow these instructions at home: Eating and drinking  Eat a diet that includes fresh fruits and vegetables, whole grains, lean protein, and low-fat dairy products. Limit your intake of foods with high amounts of sugar, saturated fats, and salt.  Take vitamin and mineral supplements as recommended by your health care provider.  Do not drink alcohol if your health care provider tells you not to drink.  If you drink alcohol: ? Limit how much you have to 0-1 drink a day. ? Be aware of how much alcohol is in your drink. In the [place] one drink equals one 12 oz bottle of beer (355 mL), one 5 oz glass of wine (148 mL), or one 1 oz glass of hard liquor (44 mL). Lifestyle  Take daily care of your teeth and gums.  Stay active. Exercise for at least 30 minutes on 5 or more days  each week.  Do not use any products that contain nicotine or tobacco, such as cigarettes, e-cigarettes, and chewing tobacco. If you need help quitting, ask your health care provider.  If you are sexually active, practice safe sex. Use a condom or other form of protection in order to prevent STIs (sexually transmitted infections).  Talk with your health care provider about taking a low-dose aspirin or statin. What's next?  Go to your health care provider once a year for a well check visit.  Ask your health care provider how often you should have your eyes and teeth checked.  Stay up to date on all vaccines. This information is not intended to replace advice given to you by your health care provider. Make sure you discuss any questions you have with your health care provider. Document Revised: 12/24/2017 Document Reviewed: 12/24/2017 Elsevier Patient Education  2020 [employer].

## 2019-04-11 ENCOUNTER — Other Ambulatory Visit: Payer: Self-pay

## 2019-04-11 ENCOUNTER — Ambulatory Visit (INDEPENDENT_AMBULATORY_CARE_PROVIDER_SITE_OTHER): Payer: Medicare HMO | Admitting: Family Medicine

## 2019-04-11 ENCOUNTER — Encounter: Payer: Self-pay | Admitting: Family Medicine

## 2019-04-11 VITALS — BP 129/72 | HR 60 | Temp 97.7°F | Resp 12 | Ht 64.0 in | Wt 131.8 lb

## 2019-04-11 DIAGNOSIS — F339 Major depressive disorder, recurrent, unspecified: Secondary | ICD-10-CM

## 2019-04-11 DIAGNOSIS — Z0000 Encounter for general adult medical examination without abnormal findings: Secondary | ICD-10-CM | POA: Diagnosis not present

## 2019-04-11 DIAGNOSIS — R739 Hyperglycemia, unspecified: Secondary | ICD-10-CM | POA: Diagnosis not present

## 2019-04-11 DIAGNOSIS — R69 Illness, unspecified: Secondary | ICD-10-CM | POA: Diagnosis not present

## 2019-04-11 DIAGNOSIS — K219 Gastro-esophageal reflux disease without esophagitis: Secondary | ICD-10-CM | POA: Diagnosis not present

## 2019-04-11 DIAGNOSIS — K449 Diaphragmatic hernia without obstruction or gangrene: Secondary | ICD-10-CM

## 2019-04-11 DIAGNOSIS — E785 Hyperlipidemia, unspecified: Secondary | ICD-10-CM | POA: Diagnosis not present

## 2019-04-11 DIAGNOSIS — E559 Vitamin D deficiency, unspecified: Secondary | ICD-10-CM

## 2019-04-11 LAB — VITAMIN D 25 HYDROXY (VIT D DEFICIENCY, FRACTURES): VITD: 27.27 ng/mL — ABNORMAL LOW (ref 30.00–100.00)

## 2019-04-11 LAB — COMPREHENSIVE METABOLIC PANEL
ALT: 13 U/L (ref 0–35)
AST: 17 U/L (ref 0–37)
Albumin: 4.3 g/dL (ref 3.5–5.2)
Alkaline Phosphatase: 77 U/L (ref 39–117)
BUN: 16 mg/dL (ref 6–23)
CO2: 30 mEq/L (ref 19–32)
Calcium: 9.6 mg/dL (ref 8.4–10.5)
Chloride: 101 mEq/L (ref 96–112)
Creatinine, Ser: 0.79 mg/dL (ref 0.40–1.20)
GFR: 72.78 mL/min (ref >60.00)
Glucose, Bld: 98 mg/dL (ref 70–99)
Potassium: 5.2 mEq/L — ABNORMAL HIGH (ref 3.5–5.1)
Sodium: 139 mEq/L (ref 135–145)
Total Bilirubin: 0.4 mg/dL (ref 0.2–1.2)
Total Protein: 6.8 g/dL (ref 6.0–8.3)

## 2019-04-11 LAB — LIPID PANEL
Cholesterol: 253 mg/dL — ABNORMAL HIGH (ref 0–200)
HDL: 74.3 mg/dL (ref >39.00)
LDL Cholesterol: 162 mg/dL — ABNORMAL HIGH (ref 0–99)
NonHDL: 179.19
Total CHOL/HDL Ratio: 3
Triglycerides: 86 mg/dL (ref 0.0–149.0)
VLDL: 17.2 mg/dL (ref 0.0–40.0)

## 2019-04-11 LAB — CBC
HCT: 41 % (ref 36.0–46.0)
Hemoglobin: 13.7 g/dL (ref 12.0–15.0)
MCHC: 33.5 g/dL (ref 30.0–36.0)
MCV: 97.1 fl (ref 78.0–100.0)
Platelets: 294 10*3/uL (ref 150.0–400.0)
RBC: 4.22 Mil/uL (ref 3.87–5.11)
RDW: 14.5 % (ref 11.5–15.5)
WBC: 5.3 10*3/uL (ref 4.0–10.5)

## 2019-04-11 LAB — TSH: TSH: 2.21 u[IU]/mL (ref 0.35–4.50)

## 2019-04-11 LAB — HEMOGLOBIN A1C: Hgb A1c MFr Bld: 6.1 % (ref 4.6–6.5)

## 2019-04-11 MED ORDER — VENLAFAXINE HCL ER 150 MG PO CP24: ORAL_CAPSULE | ORAL | 1 refills | Status: DC

## 2019-04-11 MED ORDER — OMEPRAZOLE 20 MG PO CPDR: 20.0000 mg | DELAYED_RELEASE_CAPSULE | Freq: Every day | ORAL | 1 refills | Status: DC

## 2019-04-11 NOTE — Patient Instructions (Signed)
[facility]ron Blood Pressure cuff, upper arm, want BP 100-140/60-90 Pulse oximeter, want oxygen in 90s  Weekly vitals  Take Multivitamin with minerals, selenium Vitamin D 1000-2000 IU daily Probiotic with lactobacillus and bifidophilus Asprin EC 81 mg daily Fish oil or krill oil daily Melatonin 2-5 mg at bedtime  [email or web address].[email or web address]  Preventive Care 65 Years and Older, Female Preventive care refers to lifestyle choices and visits with your health care provider that can promote health and wellness. This includes:  A yearly physical exam. This is also called an annual well check.  Regular dental and eye exams.  Immunizations.  Screening for certain conditions.  Healthy lifestyle choices, such as diet and exercise. What can I expect for my preventive care visit? Physical exam Your health care provider will check:  Height and weight. These may be used to calculate body mass index (BMI), which is a measurement that tells if you are at a healthy weight.  Heart rate and blood pressure.  Your skin for abnormal spots. Counseling Your health care provider may ask you questions about:  Alcohol, tobacco, and drug use.  Emotional well-being.  Home and relationship well-being.  Sexual activity.  Eating habits.  History of falls.  Memory and ability to understand (cognition).  Work and work Statistician.  Pregnancy and menstrual history. What immunizations do I need?  Influenza (flu) vaccine  This is recommended every year. Tetanus, diphtheria, and pertussis (Tdap) vaccine  You may need a Td booster every 10 years. Varicella (chickenpox) vaccine  You may need this vaccine if you have not already been vaccinated. Zoster (shingles) vaccine  You may need this after age 53. Pneumococcal conjugate (PCV13) vaccine  One dose is recommended after age 25. Pneumococcal polysaccharide (PPSV23) vaccine  One dose is recommended after age 2. Measles, mumps, and rubella (MMR)  vaccine  You may need at least one dose of MMR if you were born in 1957 or later. You may also need a second dose. Meningococcal conjugate (MenACWY) vaccine  You may need this if you have certain conditions. Hepatitis A vaccine  You may need this if you have certain conditions or if you travel or work in places where you may be exposed to hepatitis A. Hepatitis B vaccine  You may need this if you have certain conditions or if you travel or work in places where you may be exposed to hepatitis B. Haemophilus influenzae type b (Hib) vaccine  You may need this if you have certain conditions. You may receive vaccines as individual doses or as more than one vaccine together in one shot (combination vaccines). Talk with your health care provider about the risks and benefits of combination vaccines. What tests do I need? Blood tests  Lipid and cholesterol levels. These may be checked every 5 years, or more frequently depending on your overall health.  Hepatitis C test.  Hepatitis B test. Screening  Lung cancer screening. You may have this screening every year starting at age 73 if you have a 30-pack-year history of smoking and currently smoke or have quit within the past 15 years.  Colorectal cancer screening. All adults should have this screening starting at age 1 and continuing until age 3. Your health care provider may recommend screening at age 27 if you are at increased risk. You will have tests every 1-10 years, depending on your results and the type of screening test.  Diabetes screening. This is done by checking your blood sugar (glucose) after you have not eaten for a while (  fasting). You may have this done every 1-3 years.  Mammogram. This may be done every 1-2 years. Talk with your health care provider about how often you should have regular mammograms.  BRCA-related cancer screening. This may be done if you have a family history of breast, ovarian, tubal, or peritoneal  cancers. Other tests  Sexually transmitted disease (STD) testing.  Bone density scan. This is done to screen for osteoporosis. You may have this done starting at age 72. Follow these instructions at home: Eating and drinking  Eat a diet that includes fresh fruits and vegetables, whole grains, lean protein, and low-fat dairy products. Limit your intake of foods with high amounts of sugar, saturated fats, and salt.  Take vitamin and mineral supplements as recommended by your health care provider.  Do not drink alcohol if your health care provider tells you not to drink.  If you drink alcohol: ? Limit how much you have to 0-1 drink a day. ? Be aware of how much alcohol is in your drink. In the [place] one drink equals one 12 oz bottle of beer (355 mL), one 5 oz glass of wine (148 mL), or one 1 oz glass of hard liquor (44 mL). Lifestyle  Take daily care of your teeth and gums.  Stay active. Exercise for at least 30 minutes on 5 or more days each week.  Do not use any products that contain nicotine or tobacco, such as cigarettes, e-cigarettes, and chewing tobacco. If you need help quitting, ask your health care provider.  If you are sexually active, practice safe sex. Use a condom or other form of protection in order to prevent STIs (sexually transmitted infections).  Talk with your health care provider about taking a low-dose aspirin or statin. What's next?  Go to your health care provider once a year for a well check visit.  Ask your health care provider how often you should have your eyes and teeth checked.  Stay up to date on all vaccines. This information is not intended to replace advice given to you by your health care provider. Make sure you discuss any questions you have with your health care provider. Document Revised: 12/24/2017 Document Reviewed: 12/24/2017 Elsevier Patient Education  2020 [employer].  [email or web address]

## 2019-04-12 DIAGNOSIS — R69 Illness, unspecified: Secondary | ICD-10-CM | POA: Diagnosis not present

## 2019-04-13 NOTE — Assessment & Plan Note (Signed)
Given a refill on Omeprazole

## 2019-04-13 NOTE — Assessment & Plan Note (Signed)
hgba1c acceptable, minimize simple carbs. Increase exercise as tolerated.  

## 2019-04-13 NOTE — Assessment & Plan Note (Signed)
Doing well. Refill on Venlafaxine given.

## 2019-04-13 NOTE — Progress Notes (Signed)
Subjective:    Patient ID: Lauren [name], female    DOB: 10-22-1955, 64 y.o.   MRN: [id number]  Chief Complaint  Patient presents with  . Annual Exam    HPI Patient is in today for annual preventative exam. No recent febrile illness or hospitalizations. She has been quarantining well. Is eating well. Is now fully COVID vaccinated and has been out some more but still doing well. She is eating well and she exercises regularly and she works on Insurance underwriter, strength, mindfulness and yoga regularly. Denies CP/palp/SOB/HA/congestion/fevers/GI or GU c/o. Taking meds as prescribed  Past Medical History:  Diagnosis Date  . 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 Chicken pox as a child  . Depression    been on antidepressants on and off for 20 yr  . GERD (gastroesophageal reflux disease)    silent with hiatal hernia  . Hiatal hernia 05/22/2013  . Hiatal hernia with gastroesophageal reflux 05/22/2013  . Hot tub folliculitis [id number]  . Hyperglycemia 05/19/2016  . Hyperlipidemia 05/19/2016  . Measles as a child  . Postmenopausal atrophic vaginitis 05/22/2013  . Preventative health care 01/19/2015  . Shingles 64 yrs old  . Unspecified vitamin D deficiency 05/22/2013    Past Surgical History:  Procedure Laterality Date  . ABDOMINAL HYSTERECTOMY  1998   partial-still has ovaries  . ESOPHAGECTOMY  05-2008  . INDUCED ABORTION  1979    Family History  Problem Relation Age of Onset  . Cancer Mother 59       breast cancer  . Emphysema Mother        smoker  . Heart attack Father        X several  . Mental illness Brother        bipolar, xanax and thc abuse  . Obesity Brother   . Depression Daughter   . Anxiety disorder Daughter   . Other Maternal Grandmother        blood clot, dvt, lung  . Cancer Paternal Grandmother        stomach  . Heart disease Paternal Grandfather     Social History   Socioeconomic History  . Marital status: Married    Spouse name: [name]  . Number of children:  Not on file  . Years of education: Not on file  . Highest education level: Not on file  Occupational History    Employer: [employer]    Comment: Event Planner  Tobacco Use  . Smoking status: Former Smoker    Packs/day: 0.10    Years: 25.00    Pack years: 2.50    Types: Cigarettes    Start date: 01/13/1998  . Smokeless tobacco: Never Used  . Tobacco comment: 1-2 cigarettes a day  Substance and Sexual Activity  . Alcohol use: Yes    Comment: a couple glasses of wine every other day  . Drug use: No  . Sexual activity: Yes    Partners: Male    Comment: lives with husband, event planner, no dietary restrictions  Other Topics Concern  . 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 Difficulty of Paying Living Expenses: Not hard at all  Food Insecurity: No Food Insecurity  . Worried About Charity fundraiser in the Last Year: Never true  . Ran Out of Food in the Last Year: Never true  Transportation Needs: No Transportation Needs  . Lack of Transportation (Medical): No  . Lack of Transportation (Non-Medical): No  Physical Activity:   . Days of Exercise per Week:   . Minutes of Exercise per Session:   Stress:   . Feeling of Stress :   Social Connections:   . Frequency of Communication with Friends and Family:   . Frequency of Social Gatherings with Friends and Family:   . Attends Religious Services:   . Active Member of Clubs or Organizations:   . Attends Archivist Meetings:   Marland Kitchen Marital Status:   Intimate Partner Violence:   . Fear of Current or Ex-Partner:   . Emotionally Abused:   Marland Kitchen Physically Abused:   . Sexually Abused:     Outpatient Medications Prior to Visit  Medication Sig Dispense Refill  . Multiple Vitamins-Minerals (MULTIVITAL PO) Take by mouth daily. liquid    . pantoprazole (PROTONIX) 40 MG tablet Take 1 tablet (40 mg total) by  mouth daily. 90 tablet 3  . venlafaxine XR (EFFEXOR-XR) 150 MG 24 hr capsule TAKE 1 CAPSULE BY MOUTH EVERY DAY 30 capsule 0   No facility-administered medications prior to visit.    Allergies  Allergen Reactions  . Penicillins Hives    Review of Systems  Constitutional: Negative for chills, fever and malaise/fatigue.  HENT: Negative for congestion and hearing loss.   Eyes: Negative for discharge.  Respiratory: Negative for cough, sputum production and shortness of breath.   Cardiovascular: Negative for chest pain, palpitations and leg swelling.  Gastrointestinal: Negative for abdominal pain, blood in stool, constipation, diarrhea, heartburn, nausea and vomiting.  Genitourinary: Negative for dysuria, frequency, hematuria and urgency.  Musculoskeletal: Negative for back pain, falls and myalgias.  Skin: Negative for rash.  Neurological: Negative for dizziness, sensory change, loss of consciousness, weakness and headaches.  Endo/Heme/Allergies: Negative for environmental allergies. Does not bruise/bleed easily.  Psychiatric/Behavioral: Negative for depression and suicidal ideas. The patient is not nervous/anxious and does not have insomnia.        Objective:    Physical Exam Constitutional:      General: She is not in acute distress.    Appearance: She is not diaphoretic.  HENT:     Head: Normocephalic and atraumatic.     Right Ear: External ear normal.     Left Ear: External ear normal.     Nose: Nose normal.     Mouth/Throat:     Pharynx: No oropharyngeal exudate.  Eyes:     General: No scleral icterus.       Right eye: No discharge.        Left eye: No discharge.     Conjunctiva/sclera: Conjunctivae normal.     Pupils: Pupils are equal, round, and reactive to light.  Neck:     Thyroid: No thyromegaly.  Cardiovascular:     Rate and Rhythm: Normal rate and regular rhythm.     Heart sounds: Normal heart sounds. No murmur.  Pulmonary:     Effort: Pulmonary effort is  normal. No respiratory distress.     Breath sounds: Normal breath sounds. No wheezing or rales.  Abdominal:     General: Bowel sounds are normal. There is no distension.     Palpations: Abdomen is soft. There is no mass.     Tenderness: There is no abdominal tenderness.  Musculoskeletal:        General: No tenderness. Normal range of motion.     Cervical back: Normal range  of motion and neck supple.  Lymphadenopathy:     Cervical: No cervical adenopathy.  Skin:    General: Skin is warm and dry.     Findings: No rash.  Neurological:     Mental Status: She is alert and oriented to person, place, and time.     Cranial Nerves: No cranial nerve deficit.     Coordination: Coordination normal.     Deep Tendon Reflexes: Reflexes are normal and symmetric. Reflexes normal.     BP 129/72 (BP Location: Left Arm, Cuff Size: Normal)   Pulse 60   Temp 97.7 F (36.5 C) (Oral)   Resp 12   Ht 5\' 4"  (1.626 m)   Wt 131 lb 12.8 oz (59.8 kg)   SpO2 100%   BMI 22.62 kg/m  Wt Readings from Last 3 Encounters:  04/11/19 131 lb 12.8 oz (59.8 kg)  10/11/18 124 lb (56.2 kg)  10/20/17 122 lb 9.6 oz (55.6 kg)    Diabetic Foot Exam - Simple   No data filed     Lab Results  Component Value Date   WBC 5.3 04/11/2019   HGB 13.7 04/11/2019   HCT 41.0 04/11/2019   PLT 294.0 04/11/2019   GLUCOSE 98 04/11/2019   CHOL 253 (H) 04/11/2019   TRIG 86.0 04/11/2019   HDL 74.30 04/11/2019   LDLCALC 162 (H) 04/11/2019   ALT 13 04/11/2019   AST 17 04/11/2019   NA 139 04/11/2019   K 5.2 No hemolysis seen (H) 04/11/2019   CL 101 04/11/2019   CREATININE 0.79 04/11/2019   BUN 16 04/11/2019   CO2 30 04/11/2019   TSH 2.21 04/11/2019   INR 1.0 05/04/2008   HGBA1C 6.1 04/11/2019    Lab Results  Component Value Date   TSH 2.21 04/11/2019   Lab Results  Component Value Date   WBC 5.3 04/11/2019   HGB 13.7 04/11/2019   HCT 41.0 04/11/2019   MCV 97.1 04/11/2019   PLT 294.0 04/11/2019   Lab Results    Component Value Date   NA 139 04/11/2019   K 5.2 No hemolysis seen (H) 04/11/2019   CO2 30 04/11/2019   GLUCOSE 98 04/11/2019   BUN 16 04/11/2019   CREATININE 0.79 04/11/2019   BILITOT 0.4 04/11/2019   ALKPHOS 77 04/11/2019   AST 17 04/11/2019   ALT 13 04/11/2019   PROT 6.8 04/11/2019   ALBUMIN 4.3 04/11/2019   CALCIUM 9.6 04/11/2019   GFR 72.78 04/11/2019   Lab Results  Component Value Date   CHOL 253 (H) 04/11/2019   Lab Results  Component Value Date   HDL 74.30 04/11/2019   Lab Results  Component Value Date   LDLCALC 162 (H) 04/11/2019   Lab Results  Component Value Date   TRIG 86.0 04/11/2019   Lab Results  Component Value Date   CHOLHDL 3 04/11/2019   Lab Results  Component Value Date   HGBA1C 6.1 04/11/2019       Assessment & Plan:   Problem List Items Addressed This Visit    Depression    Doing well. Refill on Venlafaxine given.      Relevant Medications   venlafaxine XR (EFFEXOR-XR) 150 MG 24 hr capsule   Hiatal hernia with gastroesophageal reflux    Given a refill on Omeprazole      Relevant Medications   omeprazole (PRILOSEC) 20 MG capsule   Other Relevant Orders   CBC (Completed)   Comprehensive metabolic panel (Completed)   Vitamin D  deficiency - Primary    Supplement and monitor      Relevant Orders   TSH (Completed)   VITAMIN D 25 Hydroxy (Vit-D Deficiency, Fractures) (Completed)   Preventative health care    Patient encouraged to maintain heart healthy diet, regular exercise, adequate sleep. Consider daily probiotics. Take medications as prescribed. Labs ordered and reviewed.       Hyperglycemia    hgba1c acceptable, minimize simple carbs. Increase exercise as tolerated.       Relevant Orders   Hemoglobin A1c (Completed)   Hyperlipidemia    Encouraged heart healthy diet, increase exercise, avoid trans fats, consider a krill oil cap daily      Relevant Orders   Lipid panel (Completed)   TSH (Completed)      I have  discontinued Dashelle Bartkus. Salmi's pantoprazole. I am also having her start on omeprazole. Additionally, I am having her maintain her Multiple Vitamins-Minerals (MULTIVITAL PO) and venlafaxine XR.  Meds ordered this encounter  Medications  . venlafaxine XR (EFFEXOR-XR) 150 MG 24 hr capsule    Sig: TAKE 1 CAPSULE BY MOUTH EVERY DAY    Dispense:  90 capsule    Refill:  1  . omeprazole (PRILOSEC) 20 MG capsule    Sig: Take 1 capsule (20 mg total) by mouth daily.    Dispense:  90 capsule    Refill:  1     Penni Homans, MD

## 2019-04-13 NOTE — Assessment & Plan Note (Signed)
Encouraged heart healthy diet, increase exercise, avoid trans fats, consider a krill oil cap daily 

## 2019-04-13 NOTE — Assessment & Plan Note (Signed)
Supplement and monitor 

## 2019-04-13 NOTE — Assessment & Plan Note (Signed)
Patient encouraged to maintain heart healthy diet, regular exercise, adequate sleep. Consider daily probiotics. Take medications as prescribed. Labs ordered and reviewed 

## 2019-04-23 DIAGNOSIS — R69 Illness, unspecified: Secondary | ICD-10-CM | POA: Diagnosis not present

## 2019-04-28 DIAGNOSIS — R69 Illness, unspecified: Secondary | ICD-10-CM | POA: Diagnosis not present

## 2019-05-02 ENCOUNTER — Other Ambulatory Visit: Payer: Self-pay | Admitting: Family Medicine

## 2019-05-23 DIAGNOSIS — R69 Illness, unspecified: Secondary | ICD-10-CM | POA: Diagnosis not present

## 2019-07-12 ENCOUNTER — Encounter: Payer: Self-pay | Admitting: Family Medicine

## 2019-07-13 ENCOUNTER — Other Ambulatory Visit: Payer: Self-pay | Admitting: Family Medicine

## 2019-07-13 MED ORDER — ROSUVASTATIN CALCIUM 5 MG PO TABS: 5.0000 mg | ORAL_TABLET | Freq: Every day | ORAL | 3 refills | Status: DC

## 2019-07-27 ENCOUNTER — Telehealth: Payer: Self-pay | Admitting: Family Medicine

## 2019-07-27 NOTE — Telephone Encounter (Signed)
She can take Benadryl bid vs Zyrtec bid and Famotidine 20 mg po bid. Also cleanse with Witch Hazel astringent and then can use calamine and/or hydrocortisone topically. Seek care if worsens.

## 2019-07-27 NOTE — Telephone Encounter (Signed)
Patient states she was stung by three bees , and patient thigh has swollen up . Patient is using baking soad and other over the counter medication. No relief.  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      Please Advise

## 2019-07-27 NOTE — Telephone Encounter (Signed)
FYI  Patient was stung by 3 yellow jackets 3 days ago.  Her leg is swollen, itchy, and feels like it is on fire.  She has tried calamine lotion, aloe, cornstarch, and ice packs.  She has been doing mostly topicals and she just today brought some benadryl to take by mouth.  I advised that she should be seen and evaluated but she would like to try the benadryl first.  Advised that if she gets worse she needs to go to urgent care or get help immediately.

## 2019-07-27 NOTE — Telephone Encounter (Signed)
Patient notified

## 2019-08-09 DIAGNOSIS — R69 Illness, unspecified: Secondary | ICD-10-CM | POA: Diagnosis not present

## 2019-08-31 DIAGNOSIS — M6740 Ganglion, unspecified site: Secondary | ICD-10-CM | POA: Insufficient documentation

## 2019-08-31 DIAGNOSIS — M19042 Primary osteoarthritis, left hand: Secondary | ICD-10-CM | POA: Insufficient documentation

## 2019-08-31 DIAGNOSIS — R2232 Localized swelling, mass and lump, left upper limb: Secondary | ICD-10-CM | POA: Insufficient documentation

## 2019-08-31 DIAGNOSIS — M71342 Other bursal cyst, left hand: Secondary | ICD-10-CM | POA: Diagnosis not present

## 2019-09-06 ENCOUNTER — Encounter: Payer: Self-pay | Admitting: Family Medicine

## 2019-09-26 DIAGNOSIS — R69 Illness, unspecified: Secondary | ICD-10-CM | POA: Diagnosis not present

## 2019-10-02 DIAGNOSIS — Z20822 Contact with and (suspected) exposure to covid-19: Secondary | ICD-10-CM | POA: Diagnosis not present

## 2019-10-12 ENCOUNTER — Other Ambulatory Visit: Payer: Self-pay | Admitting: Family Medicine

## 2019-10-12 DIAGNOSIS — K449 Diaphragmatic hernia without obstruction or gangrene: Secondary | ICD-10-CM

## 2019-10-12 DIAGNOSIS — K219 Gastro-esophageal reflux disease without esophagitis: Secondary | ICD-10-CM

## 2019-10-12 MED ORDER — OMEPRAZOLE 20 MG PO CPDR: 20.0000 mg | DELAYED_RELEASE_CAPSULE | Freq: Every day | ORAL | 3 refills | Status: DC

## 2019-11-10 ENCOUNTER — Other Ambulatory Visit: Payer: Self-pay | Admitting: Family Medicine

## 2019-11-19 DIAGNOSIS — M79602 Pain in left arm: Secondary | ICD-10-CM | POA: Diagnosis not present

## 2019-11-19 DIAGNOSIS — S41112A Laceration without foreign body of left upper arm, initial encounter: Secondary | ICD-10-CM | POA: Diagnosis not present

## 2019-11-19 DIAGNOSIS — W5501XA Bitten by cat, initial encounter: Secondary | ICD-10-CM | POA: Diagnosis not present

## 2019-11-21 ENCOUNTER — Other Ambulatory Visit: Payer: Self-pay

## 2019-11-21 ENCOUNTER — Inpatient Hospital Stay (HOSPITAL_COMMUNITY)
Admission: EM | Admit: 2019-11-21 | Discharge: 2019-11-24 | DRG: 603 | Disposition: A | Discharge status: Home / Self Care | Payer: Medicare HMO | Attending: Internal Medicine | Admitting: Internal Medicine

## 2019-11-21 ENCOUNTER — Encounter (HOSPITAL_COMMUNITY): Payer: Self-pay | Admitting: Internal Medicine

## 2019-11-21 ENCOUNTER — Emergency Department (HOSPITAL_COMMUNITY): Payer: Medicare HMO

## 2019-11-21 DIAGNOSIS — K219 Gastro-esophageal reflux disease without esophagitis: Secondary | ICD-10-CM | POA: Diagnosis not present

## 2019-11-21 DIAGNOSIS — K8051 Calculus of bile duct without cholangitis or cholecystitis with obstruction: Secondary | ICD-10-CM | POA: Diagnosis present

## 2019-11-21 DIAGNOSIS — F32A Depression, unspecified: Secondary | ICD-10-CM | POA: Diagnosis present

## 2019-11-21 DIAGNOSIS — Z23 Encounter for immunization: Secondary | ICD-10-CM

## 2019-11-21 DIAGNOSIS — Z818 Family history of other mental and behavioral disorders: Secondary | ICD-10-CM

## 2019-11-21 DIAGNOSIS — R7989 Other specified abnormal findings of blood chemistry: Secondary | ICD-10-CM | POA: Diagnosis not present

## 2019-11-21 DIAGNOSIS — W5501XD Bitten by cat, subsequent encounter: Secondary | ICD-10-CM

## 2019-11-21 DIAGNOSIS — Z20822 Contact with and (suspected) exposure to covid-19: Secondary | ICD-10-CM | POA: Diagnosis not present

## 2019-11-21 DIAGNOSIS — Z880 Allergy status to penicillin: Secondary | ICD-10-CM

## 2019-11-21 DIAGNOSIS — Z8619 Personal history of other infectious and parasitic diseases: Secondary | ICD-10-CM

## 2019-11-21 DIAGNOSIS — R932 Abnormal findings on diagnostic imaging of liver and biliary tract: Secondary | ICD-10-CM | POA: Diagnosis not present

## 2019-11-21 DIAGNOSIS — Z79899 Other long term (current) drug therapy: Secondary | ICD-10-CM

## 2019-11-21 DIAGNOSIS — Z87891 Personal history of nicotine dependence: Secondary | ICD-10-CM

## 2019-11-21 DIAGNOSIS — L03114 Cellulitis of left upper limb: Secondary | ICD-10-CM

## 2019-11-21 DIAGNOSIS — Z8501 Personal history of malignant neoplasm of esophagus: Secondary | ICD-10-CM | POA: Diagnosis not present

## 2019-11-21 DIAGNOSIS — K449 Diaphragmatic hernia without obstruction or gangrene: Secondary | ICD-10-CM | POA: Diagnosis present

## 2019-11-21 DIAGNOSIS — F419 Anxiety disorder, unspecified: Secondary | ICD-10-CM | POA: Diagnosis present

## 2019-11-21 DIAGNOSIS — R748 Abnormal levels of other serum enzymes: Secondary | ICD-10-CM | POA: Diagnosis not present

## 2019-11-21 DIAGNOSIS — E785 Hyperlipidemia, unspecified: Secondary | ICD-10-CM | POA: Diagnosis present

## 2019-11-21 DIAGNOSIS — W5501XA Bitten by cat, initial encounter: Secondary | ICD-10-CM

## 2019-11-21 DIAGNOSIS — R69 Illness, unspecified: Secondary | ICD-10-CM | POA: Diagnosis not present

## 2019-11-21 DIAGNOSIS — Z90710 Acquired absence of both cervix and uterus: Secondary | ICD-10-CM

## 2019-11-21 DIAGNOSIS — K8071 Calculus of gallbladder and bile duct without cholecystitis with obstruction: Secondary | ICD-10-CM | POA: Diagnosis not present

## 2019-11-21 DIAGNOSIS — M7989 Other specified soft tissue disorders: Secondary | ICD-10-CM | POA: Diagnosis not present

## 2019-11-21 DIAGNOSIS — R945 Abnormal results of liver function studies: Secondary | ICD-10-CM

## 2019-11-21 DIAGNOSIS — E559 Vitamin D deficiency, unspecified: Secondary | ICD-10-CM | POA: Diagnosis not present

## 2019-11-21 DIAGNOSIS — K8050 Calculus of bile duct without cholangitis or cholecystitis without obstruction: Secondary | ICD-10-CM | POA: Diagnosis not present

## 2019-11-21 DIAGNOSIS — L0390 Cellulitis, unspecified: Secondary | ICD-10-CM

## 2019-11-21 DIAGNOSIS — K8020 Calculus of gallbladder without cholecystitis without obstruction: Secondary | ICD-10-CM | POA: Diagnosis not present

## 2019-11-21 DIAGNOSIS — F339 Major depressive disorder, recurrent, unspecified: Secondary | ICD-10-CM | POA: Diagnosis not present

## 2019-11-21 DIAGNOSIS — S41152D Open bite of left upper arm, subsequent encounter: Secondary | ICD-10-CM | POA: Diagnosis not present

## 2019-11-21 LAB — CBC WITH DIFFERENTIAL/PLATELET
Abs Immature Granulocytes: 0.02 10*3/uL (ref 0.00–0.07)
Basophils Absolute: 0 10*3/uL (ref 0.0–0.1)
Basophils Relative: 1 %
Eosinophils Absolute: 0.1 10*3/uL (ref 0.0–0.5)
Eosinophils Relative: 1 %
HCT: 40.7 % (ref 36.0–46.0)
Hemoglobin: 13.5 g/dL (ref 12.0–15.0)
Immature Granulocytes: 0 %
Lymphocytes Relative: 16 %
Lymphs Abs: 1.2 10*3/uL (ref 0.7–4.0)
MCH: 32.3 pg (ref 26.0–34.0)
MCHC: 33.2 g/dL (ref 30.0–36.0)
MCV: 97.4 fL (ref 80.0–100.0)
Monocytes Absolute: 0.5 10*3/uL (ref 0.1–1.0)
Monocytes Relative: 7 %
Neutro Abs: 5.6 10*3/uL (ref 1.7–7.7)
Neutrophils Relative %: 75 %
Platelets: 296 10*3/uL (ref 150–400)
RBC: 4.18 MIL/uL (ref 3.87–5.11)
RDW: 14.6 % (ref 11.5–15.5)
WBC: 7.5 10*3/uL (ref 4.0–10.5)
nRBC: 0 % (ref 0.0–0.2)

## 2019-11-21 LAB — URINALYSIS, ROUTINE W REFLEX MICROSCOPIC
Bilirubin Urine: NEGATIVE
Glucose, UA: NEGATIVE mg/dL
Ketones, ur: NEGATIVE mg/dL
Nitrite: NEGATIVE
Protein, ur: NEGATIVE mg/dL
Specific Gravity, Urine: 1.005 (ref 1.005–1.030)
pH: 5 (ref 5.0–8.0)

## 2019-11-21 LAB — RESPIRATORY PANEL BY RT PCR (FLU A&B, COVID)
Influenza A by PCR: NEGATIVE
Influenza B by PCR: NEGATIVE
SARS Coronavirus 2 by RT PCR: NEGATIVE

## 2019-11-21 LAB — COMPREHENSIVE METABOLIC PANEL
ALT: 298 U/L — ABNORMAL HIGH (ref 0–44)
AST: 373 U/L — ABNORMAL HIGH (ref 15–41)
Albumin: 4.1 g/dL (ref 3.5–5.0)
Alkaline Phosphatase: 158 U/L — ABNORMAL HIGH (ref 38–126)
Anion gap: 8 (ref 5–15)
BUN: 15 mg/dL (ref 8–23)
CO2: 28 mmol/L (ref 22–32)
Calcium: 9 mg/dL (ref 8.9–10.3)
Chloride: 100 mmol/L (ref 98–111)
Creatinine, Ser: 0.81 mg/dL (ref 0.44–1.00)
GFR, Estimated: >60 mL/min (ref >60)
Glucose, Bld: 112 mg/dL — ABNORMAL HIGH (ref 70–99)
Potassium: 4.2 mmol/L (ref 3.5–5.1)
Sodium: 136 mmol/L (ref 135–145)
Total Bilirubin: 0.7 mg/dL (ref 0.3–1.2)
Total Protein: 7.4 g/dL (ref 6.5–8.1)

## 2019-11-21 LAB — LACTIC ACID, PLASMA
Lactic Acid, Venous: 0.7 mmol/L (ref 0.5–1.9)
Lactic Acid, Venous: 0.9 mmol/L (ref 0.5–1.9)

## 2019-11-21 MED ORDER — ENOXAPARIN SODIUM 40 MG/0.4ML ~~LOC~~ SOLN
40.0000 mg | SUBCUTANEOUS | Status: DC
  Administered 2019-11-21 – 2019-11-22 (×2): 40 mg via SUBCUTANEOUS
  Filled 2019-11-21 (×2): qty 0.4

## 2019-11-21 MED ORDER — ONDANSETRON HCL 4 MG/2ML IJ SOLN
4.0000 mg | Freq: Four times a day (QID) | INTRAMUSCULAR | Status: DC | PRN
  Administered 2019-11-21: 4 mg via INTRAVENOUS
  Filled 2019-11-21 (×2): qty 2

## 2019-11-21 MED ORDER — CIPROFLOXACIN IN D5W 400 MG/200ML IV SOLN
400.0000 mg | Freq: Two times a day (BID) | INTRAVENOUS | Status: DC
  Administered 2019-11-21 – 2019-11-24 (×6): 400 mg via INTRAVENOUS
  Filled 2019-11-21 (×5): qty 200

## 2019-11-21 MED ORDER — CLINDAMYCIN PHOSPHATE 600 MG/50ML IV SOLN
600.0000 mg | Freq: Once | INTRAVENOUS | Status: AC
  Administered 2019-11-21: 600 mg via INTRAVENOUS
  Filled 2019-11-21: qty 50

## 2019-11-21 MED ORDER — METRONIDAZOLE 500 MG PO TABS
500.0000 mg | ORAL_TABLET | Freq: Three times a day (TID) | ORAL | Status: DC
  Administered 2019-11-21 – 2019-11-24 (×9): 500 mg via ORAL
  Filled 2019-11-21 (×9): qty 1

## 2019-11-21 MED ORDER — OXYCODONE-ACETAMINOPHEN 7.5-325 MG PO TABS
1.0000 | ORAL_TABLET | Freq: Four times a day (QID) | ORAL | Status: DC | PRN
  Administered 2019-11-21 – 2019-11-22 (×3): 1 via ORAL
  Filled 2019-11-21 (×3): qty 1

## 2019-11-21 MED ORDER — ONDANSETRON HCL 4 MG PO TABS: 4.0000 mg | ORAL_TABLET | Freq: Four times a day (QID) | ORAL | Status: DC | PRN

## 2019-11-21 MED ORDER — MORPHINE SULFATE (PF) 4 MG/ML IV SOLN
4.0000 mg | Freq: Once | INTRAVENOUS | Status: AC
  Administered 2019-11-21: 4 mg via INTRAVENOUS
  Filled 2019-11-21: qty 1

## 2019-11-21 MED ORDER — MORPHINE SULFATE (PF) 2 MG/ML IV SOLN
2.0000 mg | Freq: Once | INTRAVENOUS | Status: AC
  Administered 2019-11-21: 2 mg via INTRAVENOUS
  Filled 2019-11-21: qty 1

## 2019-11-21 MED ORDER — PNEUMOCOCCAL VAC POLYVALENT 25 MCG/0.5ML IJ INJ
0.5000 mL | INJECTION | INTRAMUSCULAR | Status: AC
  Administered 2019-11-22: 0.5 mL via INTRAMUSCULAR
  Filled 2019-11-21: qty 0.5

## 2019-11-21 NOTE — ED Provider Notes (Signed)
[facility] DEPT Provider Note   CSN: [id number] Arrival date & time: 11/21/19  1027     History Chief Complaint  Patient presents with  . Animal Bite  . Arm Swelling    Lauren [name] is a 64 y.o. female.  HPI   Patient presents to the ED for evaluation of left arm swelling and pain after a cat bite.  Patient states she was bitten by a friend's cat on her left wrist over the weekend.  Patient went to an urgent care and had sutures placed.  She was started on antibiotics.  Patient states since then she has had increasing swelling of her left wrist.  Patient states it hurts now to move her fingers and hand.  She has had increasing swelling and redness despite the antibiotics.  Patient denies any fevers.  Past Medical History:  Diagnosis Date  . 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 Chicken pox as a child  . Depression    been on antidepressants on and off for 20 yr  . GERD (gastroesophageal reflux disease)    silent with hiatal hernia  . Hiatal hernia 05/22/2013  . Hiatal hernia with gastroesophageal reflux 05/22/2013  . Hot tub folliculitis 16/09/6787  . Hyperglycemia 05/19/2016  . Hyperlipidemia 05/19/2016  . Measles as a child  . Postmenopausal atrophic vaginitis 05/22/2013  . Preventative health care 01/19/2015  . Shingles 64 yrs old  . Unspecified vitamin D deficiency 05/22/2013    Patient Active Problem List   Diagnosis Date Noted  . Hyperglycemia 05/19/2016  . Hyperlipidemia 05/19/2016  . Preventative health care 01/19/2015  . Hiatal hernia with gastroesophageal reflux 05/22/2013  . Postmenopausal atrophic vaginitis 05/22/2013  . Vitamin D deficiency 05/22/2013  . Chicken pox   . History of shingles   . Measles   . Cancer ([place])   . Depression     Past Surgical History:  Procedure Laterality Date  . ABDOMINAL HYSTERECTOMY  1998   partial-still has ovaries  . ESOPHAGECTOMY  05-2008  . INDUCED ABORTION  1979     OB History     No obstetric history on file.     Family History  Problem Relation Age of Onset  . Cancer Mother 21       breast cancer  . Emphysema Mother        smoker  . Heart attack Father        X several  . Mental illness Brother        bipolar, xanax and thc abuse  . Obesity Brother   . Depression Daughter   . Anxiety disorder Daughter   . Other Maternal Grandmother        blood clot, dvt, lung  . Cancer Paternal Grandmother        stomach  . Heart disease Paternal Grandfather     Social History   Tobacco Use  . Smoking status: Former Smoker    Packs/day: 0.10    Years: 25.00    Pack years: 2.50    Types: Cigarettes    Start date: 01/13/1998  . Smokeless tobacco: Never Used  . Tobacco comment: 1-2 cigarettes a day  Substance Use Topics  . Alcohol use: Yes    Comment: a couple glasses of wine every other day  . Drug use: No    Home Medications Prior to Admission medications   Medication Sig Start Date End Date Taking? Authorizing Provider  venlafaxine XR (EFFEXOR-XR) 150 MG 24  hr capsule Take 1 capsule (150 mg total) by mouth daily with breakfast. 05/02/19   [name], MD  Multiple Vitamins-Minerals (MULTIVITAL PO) Take by mouth daily. liquid    [provider]  omeprazole (PRILOSEC) 20 MG capsule Take 1 capsule (20 mg total) by mouth daily. 10/12/19   [name], MD  rosuvastatin (CRESTOR) 5 MG tablet TAKE 1 TABLET BY MOUTH EVERY DAY 11/10/19   [name], MD    Allergies    Penicillins  Review of Systems   Review of Systems  All other systems reviewed and are negative.   Physical Exam Updated Vital Signs BP (!) 133/99   Pulse 77   Temp 98.1 F (36.7 C) (Oral)   Resp 20   Ht 1.626 m (5\' 4" )   Wt 56.7 kg   SpO2 100%   BMI 21.46 kg/m   Physical Exam Vitals and nursing note reviewed.  Constitutional:      General: She is not in acute distress.    Appearance: She is well-developed.  HENT:     Head: Normocephalic and atraumatic.      Right Ear: External ear normal.     Left Ear: External ear normal.  Eyes:     General: No scleral icterus.       Right eye: No discharge.        Left eye: No discharge.     Conjunctiva/sclera: Conjunctivae normal.  Neck:     Trachea: No tracheal deviation.  Cardiovascular:     Rate and Rhythm: Normal rate.  Pulmonary:     Effort: Pulmonary effort is normal. No respiratory distress.     Breath sounds: No stridor.  Abdominal:     General: There is no distension.  Musculoskeletal:        General: Swelling present. No deformity.     Cervical back: Neck supple.     Comments: 2 sutured wounds around the left distal forearm, no purulent drainage, surrounding erythema, tenderness palpation with pain with range of motion of the left wrist  Skin:    General: Skin is warm and dry.     Findings: No rash.  Neurological:     Mental Status: She is alert.     Cranial Nerves: Cranial nerve deficit: no gross deficits.     ED Results / Procedures / Treatments   Labs (all labs ordered are listed, but only abnormal results are displayed) Labs Reviewed  COMPREHENSIVE METABOLIC PANEL - Abnormal; Notable for the following components:      Result Value   Glucose, Bld 112 (*)    AST 373 (*)    ALT 298 (*)    Alkaline Phosphatase 158 (*)    All other components within normal limits  URINALYSIS, ROUTINE W REFLEX MICROSCOPIC - Abnormal; Notable for the following components:   Hgb urine dipstick SMALL (*)    Leukocytes,Ua TRACE (*)    Bacteria, UA RARE (*)    All other components within normal limits  RESPIRATORY PANEL BY RT PCR (FLU A&B, COVID)  LACTIC ACID, PLASMA  LACTIC ACID, PLASMA  CBC WITH DIFFERENTIAL/PLATELET    EKG None  Radiology DG Wrist Complete Left  Result Date: 11/21/2019 CLINICAL DATA:  Swelling post cat bites. EXAM: LEFT WRIST - COMPLETE 3+ VIEW COMPARISON:  None. FINDINGS: No fracture or dislocation. Normal mineralization. Mild degenerative changes in the first [facility],  radiocarpal articulation and distal ulna. Carpal rows intact. There is diffuse soft tissue swelling. No radiodense foreign body  or subcutaneous gas. IMPRESSION: Soft tissue swelling without fracture, foreign body, or other acute bone abnormality. Electronically Signed   By: Lucrezia Europe M.D.   On: 11/21/2019 13:24    Procedures Procedures (including critical care time)  Medications Ordered in ED Medications  clindamycin (CLEOCIN) IVPB 600 mg (600 mg Intravenous New Bag/Given 11/21/19 1250)  morphine 4 MG/ML injection 4 mg (4 mg Intravenous Given 11/21/19 1249)    ED Course  I have reviewed the triage vital signs and the nursing notes.  Pertinent labs & imaging results that were available during my care of the patient were reviewed by me and considered in my medical decision making (see chart for details).  Clinical Course as of Nov 20 1404  Mon Nov 21, 2019  1300 Labs reviewed.  CBC and lactic acid level normal.  Metabolic panel is notable for elevated LFTs   [JK]  1300 Ua normal   [JK]    Clinical Course User Index [JK] Dorie Rank, MD   MDM Rules/Calculators/A&P                         Patient with cat bite.  Symptoms concerning for cellulitis now despite prophylactic antibiotics.  Will check labs and x-ray to rule out any retained Teeth.   X-ray does not show any retained foreign body.  CBC and metabolic panel unremarkable with the exception of the LFTs.  Patient does appear to have a cat bite cellulitis.  This is worsening despite oral antibiotics.  Doubt deep space infection at this time but considering her worsening symptoms I think she warrants admission for IV antibiotics and close monitoring.  Discussed lfts with patient.  She does drink alcohol regularly.  No abd pain.  Would recommend no alcohol use 1 week.  Recheck lfts.  Final Clinical Impression(s) / ED Diagnoses Final diagnoses:  Cat bite, subsequent encounter  Cellulitis of left upper extremity  Abnormal LFTs       Dorie Rank, MD 11/21/19 1409

## 2019-11-21 NOTE — H&P (Signed)
History and Physical    EVI MCCOMB VEH:[id number] DOB: Mar 27, 1955 DOA: 11/21/2019  PCP: [name], MD  Patient coming from: Home  Chief Complaint: left wrist swelling  HPI: Lauren [name] is a 64 y.o. female with medical history significant of depression/anxiety, HLD. Presenting with left wrist swelling. She reports that she had a cat bite 2 days ago. She went to urgent care where some lacerations were stitched. She was given doxycycline, clindamycin and tramadol. She took those medicines and tried ice/elevation. However, the swelling has increased and the pain persists. She did try some oxycodone that she had, and that helped a little. She became concerned and came to the ED today for further eval.    ED Course: XR was negative for acute process. She was started on IV cleocin. TRH was called for admission.   Review of Systems:  Denies CP, N, V, D, ab pain, palpitations, dyspnea, fever. Review of systems is otherwise negative for all not mentioned in HPI.   PMHx Past Medical History:  Diagnosis Date  . 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 Chicken pox as a child  . Depression    been on antidepressants on and off for 20 yr  . GERD (gastroesophageal reflux disease)    silent with hiatal hernia  . Hiatal hernia 05/22/2013  . Hiatal hernia with gastroesophageal reflux 05/22/2013  . Hot tub folliculitis 83/06/6292  . Hyperglycemia 05/19/2016  . Hyperlipidemia 05/19/2016  . Measles as a child  . Postmenopausal atrophic vaginitis 05/22/2013  . Preventative health care 01/19/2015  . Shingles 64 yrs old  . Unspecified vitamin D deficiency 05/22/2013    PSHx Past Surgical History:  Procedure Laterality Date  . ABDOMINAL HYSTERECTOMY  1998   partial-still has ovaries  . ESOPHAGECTOMY  05-2008  . INDUCED ABORTION  1979    SocHx  reports that she has quit smoking. Her smoking use included cigarettes. She started smoking about 21 years ago. She has a 2.50 pack-year smoking  history. She has never used smokeless tobacco. She reports current alcohol use. She reports that she does not use drugs.  Allergies  Allergen Reactions  . Penicillins Hives    FamHx Family History  Problem Relation Age of Onset  . Cancer Mother 20       breast cancer  . Emphysema Mother        smoker  . Heart attack Father        X several  . Mental illness Brother        bipolar, xanax and thc abuse  . Obesity Brother   . Depression Daughter   . Anxiety disorder Daughter   . Other Maternal Grandmother        blood clot, dvt, lung  . Cancer Paternal Grandmother        stomach  . Heart disease Paternal Grandfather     Prior to Admission medications   Medication Sig Start Date End Date Taking? Authorizing Provider  clindamycin (CLEOCIN) 300 MG capsule Take 300 mg by mouth 3 (three) times daily. 11/19/19  Yes [provider]  doxycycline (ADOXA) 100 MG tablet Take 100 mg by mouth 2 (two) times daily. 11/19/19  Yes [provider]  omeprazole (PRILOSEC) 20 MG capsule Take 1 capsule (20 mg total) by mouth daily. 10/12/19  Yes [name], MD  rosuvastatin (CRESTOR) 5 MG tablet TAKE 1 TABLET BY MOUTH EVERY DAY Patient taking differently: Take 5 mg by mouth daily.  11/10/19  Yes [name], MD  traMADol (ULTRAM) 50 MG tablet Take 50 mg by mouth every 6 (six) hours as needed for pain. 11/20/19  Yes [provider]  venlafaxine XR (EFFEXOR-XR) 150 MG 24 hr capsule Take 1 capsule (150 mg total) by mouth daily with breakfast. 05/02/19  Yes [name], MD    Physical Exam: Vitals:   11/21/19 1345 11/21/19 1400 11/21/19 1415 11/21/19 1430  BP:  (!) 139/91    Pulse: 70 76 85 (!) 37  Resp: 17 19 15  (!) 26  Temp:      TempSrc:      SpO2: 100% 100% 100% 93%  Weight:      Height:        General: 64 y.o. female resting in bed in NAD Eyes: PERRL, normal sclera ENMT: Nares patent w/o discharge, orophaynx clear, dentition normal, ears w/o  discharge/lesions/ulcers Neck: Supple, trachea midline Cardiovascular: RRR, +S1, S2, no m/g/r, equal pulses throughout Respiratory: CTABL, no w/r/r, normal WOB GI: BS+, NDNT, no masses noted, no organomegaly noted MSK: No c/c; LUE swelling erythema about wrist w/ multiple lacerations w/o drainage; limited ROM to flexion and extension of the left wrist Skin: No rashes, bruises Neuro: A&O x 3, no focal deficits Psyc: Appropriate interaction and affect, calm/cooperative  Labs on Admission: I have personally reviewed following labs and imaging studies  CBC: Recent Labs  Lab 11/21/19 1135  WBC 7.5  NEUTROABS 5.6  HGB 13.5  HCT 40.7  MCV 97.4  PLT 845   Basic Metabolic Panel: Recent Labs  Lab 11/21/19 1135  NA 136  K 4.2  CL 100  CO2 28  GLUCOSE 112*  BUN 15  CREATININE 0.81  CALCIUM 9.0   GFR: Estimated Creatinine Clearance: 59 mL/min (by C-G formula based on SCr of 0.81 mg/dL). Liver Function Tests: Recent Labs  Lab 11/21/19 1135  AST 373*  ALT 298*  ALKPHOS 158*  BILITOT 0.7  PROT 7.4  ALBUMIN 4.1   No results for input(s): LIPASE, AMYLASE in the last 168 hours. No results for input(s): AMMONIA in the last 168 hours. Coagulation Profile: No results for input(s): INR, PROTIME in the last 168 hours. Cardiac Enzymes: No results for input(s): CKTOTAL, CKMB, CKMBINDEX, TROPONINI in the last 168 hours. BNP (last 3 results) No results for input(s): PROBNP in the last 8760 hours. HbA1C: No results for input(s): HGBA1C in the last 72 hours. CBG: No results for input(s): GLUCAP in the last 168 hours. Lipid Profile: No results for input(s): CHOL, HDL, LDLCALC, TRIG, CHOLHDL, LDLDIRECT in the last 72 hours. Thyroid Function Tests: No results for input(s): TSH, T4TOTAL, FREET4, T3FREE, THYROIDAB in the last 72 hours. Anemia Panel: No results for input(s): VITAMINB12, FOLATE, FERRITIN, TIBC, IRON, RETICCTPCT in the last 72 hours. Urine analysis:    Component Value  Date/Time   COLORURINE YELLOW 11/21/2019 1012   APPEARANCEUR CLEAR 11/21/2019 1012   LABSPEC 1.005 11/21/2019 1012   PHURINE 5.0 11/21/2019 1012   GLUCOSEU NEGATIVE 11/21/2019 1012   HGBUR SMALL (A) 11/21/2019 1012   BILIRUBINUR NEGATIVE 11/21/2019 1012   KETONESUR NEGATIVE 11/21/2019 1012   PROTEINUR NEGATIVE 11/21/2019 1012   NITRITE NEGATIVE 11/21/2019 1012   LEUKOCYTESUR TRACE (A) 11/21/2019 1012    Radiological Exams on Admission: DG Wrist Complete Left  Result Date: 11/21/2019 CLINICAL DATA:  Swelling post cat bites. EXAM: LEFT WRIST - COMPLETE 3+ VIEW COMPARISON:  None. FINDINGS: No fracture or dislocation. Normal mineralization. Mild degenerative changes in the  first [place], radiocarpal articulation and distal ulna. Carpal rows intact. There is diffuse soft tissue swelling. No radiodense foreign body or subcutaneous gas. IMPRESSION: Soft tissue swelling without fracture, foreign body, or other acute bone abnormality. Electronically Signed   By: Lucrezia Europe M.D.   On: 11/21/2019 13:24    Assessment/Plan Cellulitis of left hand/wrist secondary to cat bite     - admit to inpt, med-surg     - on doxy, clinda outpt; we will call this failed therapy     - discussed alternative with pharm: flagyl, cipro (shortage of IV flgayl, so that will be PO, but cipro will be IV)     - pain control w/ percocet   Elevated LFTs     - she denies any previous liver issues; her numbers were normal about in March     - check hep panel, check US abdomen     - hold effexor and crestor  HLD     - hold crestor d/t LFTs  Depression/Anxiety     - hold effexor d/t LFTs  DVT prophylaxis: lovenox  Code Status: FULL  Family Communication: None at bedside.   Consults called: None  Status is: Inpatient  Remains inpatient appropriate because:Failed outpatient therapy.   Dispo: The patient is from: Home              Anticipated d/c is to: Home              Anticipated d/c date is: 2 days               Patient currently is not medically stable to d/c.  Jonnie Finner DO [facility]  If 7PM-7AM, please contact night-coverage [email or web address]  11/21/2019, 2:56 PM

## 2019-11-21 NOTE — ED Triage Notes (Signed)
Patient reports to the ER for swollen hand after a cat bite. She reports she was stitched up at urgent care and has since developed worsening swelling and difficulty moving the hand. Patient endorses a feline allergy.

## 2019-11-22 ENCOUNTER — Inpatient Hospital Stay (HOSPITAL_COMMUNITY): Payer: Medicare HMO

## 2019-11-22 DIAGNOSIS — K8051 Calculus of bile duct without cholangitis or cholecystitis with obstruction: Secondary | ICD-10-CM

## 2019-11-22 DIAGNOSIS — K219 Gastro-esophageal reflux disease without esophagitis: Secondary | ICD-10-CM

## 2019-11-22 DIAGNOSIS — F339 Major depressive disorder, recurrent, unspecified: Secondary | ICD-10-CM | POA: Diagnosis not present

## 2019-11-22 DIAGNOSIS — L03114 Cellulitis of left upper limb: Secondary | ICD-10-CM | POA: Diagnosis not present

## 2019-11-22 DIAGNOSIS — E785 Hyperlipidemia, unspecified: Secondary | ICD-10-CM

## 2019-11-22 DIAGNOSIS — K449 Diaphragmatic hernia without obstruction or gangrene: Secondary | ICD-10-CM

## 2019-11-22 LAB — HEPATITIS PANEL, ACUTE
HCV Ab: NONREACTIVE
Hep A IgM: NONREACTIVE
Hep B C IgM: NONREACTIVE
Hepatitis B Surface Ag: NONREACTIVE

## 2019-11-22 LAB — COMPREHENSIVE METABOLIC PANEL
ALT: 260 U/L — ABNORMAL HIGH (ref 0–44)
AST: 207 U/L — ABNORMAL HIGH (ref 15–41)
Albumin: 4 g/dL (ref 3.5–5.0)
Alkaline Phosphatase: 146 U/L — ABNORMAL HIGH (ref 38–126)
Anion gap: 7 (ref 5–15)
BUN: 10 mg/dL (ref 8–23)
CO2: 27 mmol/L (ref 22–32)
Calcium: 9.1 mg/dL (ref 8.9–10.3)
Chloride: 102 mmol/L (ref 98–111)
Creatinine, Ser: 0.77 mg/dL (ref 0.44–1.00)
GFR, Estimated: >60 mL/min (ref >60)
Glucose, Bld: 164 mg/dL — ABNORMAL HIGH (ref 70–99)
Potassium: 3.5 mmol/L (ref 3.5–5.1)
Sodium: 136 mmol/L (ref 135–145)
Total Bilirubin: 0.8 mg/dL (ref 0.3–1.2)
Total Protein: 7.2 g/dL (ref 6.5–8.1)

## 2019-11-22 LAB — CBC
HCT: 41.9 % (ref 36.0–46.0)
Hemoglobin: 13.7 g/dL (ref 12.0–15.0)
MCH: 31.6 pg (ref 26.0–34.0)
MCHC: 32.7 g/dL (ref 30.0–36.0)
MCV: 96.8 fL (ref 80.0–100.0)
Platelets: 288 10*3/uL (ref 150–400)
RBC: 4.33 MIL/uL (ref 3.87–5.11)
RDW: 14.2 % (ref 11.5–15.5)
WBC: 5.8 10*3/uL (ref 4.0–10.5)
nRBC: 0 % (ref 0.0–0.2)

## 2019-11-22 MED ORDER — AMOXICILLIN 250 MG PO CAPS
250.0000 mg | ORAL_CAPSULE | Freq: Once | ORAL | Status: AC
  Administered 2019-11-22: 250 mg via ORAL
  Filled 2019-11-22: qty 1

## 2019-11-22 MED ORDER — VENLAFAXINE HCL ER 150 MG PO CP24
150.0000 mg | ORAL_CAPSULE | Freq: Every day | ORAL | Status: DC
  Administered 2019-11-24: 150 mg via ORAL
  Filled 2019-11-22: qty 1

## 2019-11-22 MED ORDER — DIPHENHYDRAMINE HCL 50 MG/ML IJ SOLN
25.0000 mg | Freq: Once | INTRAMUSCULAR | Status: AC | PRN
  Administered 2019-11-22: 25 mg via INTRAVENOUS
  Filled 2019-11-22: qty 1

## 2019-11-22 MED ORDER — PANTOPRAZOLE SODIUM 40 MG PO TBEC
40.0000 mg | DELAYED_RELEASE_TABLET | Freq: Every day | ORAL | Status: DC
  Administered 2019-11-22 – 2019-11-24 (×3): 40 mg via ORAL
  Filled 2019-11-22 (×3): qty 1

## 2019-11-22 MED ORDER — POTASSIUM CHLORIDE CRYS ER 20 MEQ PO TBCR
40.0000 meq | EXTENDED_RELEASE_TABLET | Freq: Once | ORAL | Status: AC
  Administered 2019-11-22: 40 meq via ORAL
  Filled 2019-11-22: qty 2

## 2019-11-22 MED ORDER — EPINEPHRINE 0.3 MG/0.3ML IJ SOAJ
0.3000 mg | Freq: Once | INTRAMUSCULAR | Status: DC | PRN
  Filled 2019-11-22: qty 0.6

## 2019-11-22 MED ORDER — OXYCODONE HCL 5 MG PO TABS
5.0000 mg | ORAL_TABLET | ORAL | Status: DC | PRN
  Administered 2019-11-22 – 2019-11-24 (×3): 10 mg via ORAL
  Filled 2019-11-22 (×4): qty 2

## 2019-11-22 MED ORDER — MORPHINE SULFATE (PF) 2 MG/ML IV SOLN
2.0000 mg | INTRAVENOUS | Status: DC | PRN
  Administered 2019-11-22 – 2019-11-23 (×2): 2 mg via INTRAVENOUS
  Filled 2019-11-22 (×3): qty 1

## 2019-11-22 MED ORDER — SACCHAROMYCES BOULARDII 250 MG PO CAPS
250.0000 mg | ORAL_CAPSULE | Freq: Two times a day (BID) | ORAL | Status: DC
  Administered 2019-11-22 – 2019-11-24 (×4): 250 mg via ORAL
  Filled 2019-11-22 (×4): qty 1

## 2019-11-22 NOTE — Progress Notes (Signed)
PROGRESS NOTE    Lauren [name]  ZOX:[id number] DOB: 11/15/1955 DOA: 11/21/2019 PCP: [name], MD    Chief Complaint  Patient presents with  . Animal Bite  . Arm Swelling    Brief Narrative:  Patient is a pleasant 64 year old female history of depression/anxiety, hyperlipidemia presented left wrist swelling.  Patient had a cat bite 2 days prior to admission was seen at urgent care with lacerations which were sutured.  Patient given doxycycline, clindamycin and tramadol to these medications at home tried ice and elevation however due to increased swelling and persistent pain presented back to the ED.  Plain films done negative for any acute process.  Patient initially started on IV clindamycin.  Patient admitted and antibiotics changed to IV ciprofloxacin and Flagyl.  Patient noted to have a transaminitis.  Acute hepatitis panel pending.  Abdominal ultrasound concerning for choledocholithiasis, cholelithiasis.   Assessment & Plan:   Principal Problem:   Cellulitis Active Problems:   Choledocholithiasis with obstruction   Depression   Hiatal hernia with gastroesophageal reflux   Hyperlipidemia   1 left hand cellulitis secondary to cat bite Patient presented with left hand/left wrist swelling, erythema, tenderness to palpation recent cat bite 2 days prior to admission.  Patient was on oral doxycycline and clindamycin in the outpatient setting with no significant improvement and subsequently failed outpatient therapy.  Due to patient's history of penicillin allergy placed on IV ciprofloxacin and oral Flagyl after admitting physicians discussion with pharmacy.  Improving clinically.  Continue current regimen of antibiotics, ice, pain control.  2.  Choledocholithiasis with obstruction Patient noted to have a significant transaminitis on admission.  Patient with no prior history of liver issues.  Patient with normal LFTs 04/11/2019.  Patient with no significant alcohol abuse.  Acute  hepatitis panel pending.  Abdominal ultrasound done this morning concerning for cholelithiasis without evidence of cholecystitis, markedly dilated common bile duct with distal common bile duct stone causing obstruction.  Patient currently asymptomatic.  Consult gastroenterology for possible ERCP and stone retrieval.  Will eventually need cholecystectomy.  Consult with GI for further evaluation and management.  Continue to hold statin.  Follow.  3.  Hyperlipidemia Statin on hold secondary to transaminitis.  4.  Depression/anxiety Resume home regimen Effexor.  5.  Hiatal hernia with GERD Place on PPI.   DVT prophylaxis: Lovenox Code Status: Full Family Communication: Updated patient.  No family at bedside. Disposition:   Status is: Inpatient    Dispo: The patient is from: Home              Anticipated d/c is to: Home              Anticipated d/c date is: 2 to 3 days.              Patient currently on IV antibiotics for left hand cellulitis, transaminitis with abdominal ultrasound concerning for choledocholithiasis with obstruction.  Not stable for discharge.       Consultants:   Gastroenterology pending  Procedures:   Plain films of the left wrist 11/21/2019  Abdominal ultrasound 11/22/2019--- cholelithiasis without evidence of cholecystitis.  Markedly dilated common bile duct with distal common bile duct stone causing obstruction.  Antimicrobials:   Oral Flagyl 11/21/2019  IV ciprofloxacin 11/21/2019   Subjective: Sitting up in bed.  Denies chest pain or shortness of breath.  Denies any abdominal pain.  Denies any nausea or vomiting.  Feels left hand swelling has improved.  Objective: Vitals:   11/21/19  1720 11/21/19 2223 11/22/19 0505 11/22/19 0824  BP: (!) 146/77 132/70 136/74 (!) 145/85  Pulse: 73 80 68 77  Resp: 18  18 20   Temp: 98.5 F (36.9 C) 98.4 F (36.9 C) 97.7 F (36.5 C) 98.7 F (37.1 C)  TempSrc: Oral Oral Oral Oral  SpO2: 98% 99% 98%   Weight:       Height:        Intake/Output Summary (Last 24 hours) at 11/22/2019 1033 Last data filed at 11/22/2019 1000 Gross per 24 hour  Intake 1721.83 ml  Output 2250 ml  Net -528.17 ml   Filed Weights   11/21/19 1046  Weight: 56.7 kg    Examination:  General exam: Appears calm and comfortable  Respiratory system: Coarse BS in left base.  No wheezing.  No crackles.  Respiratory effort normal. Cardiovascular system: S1 & S2 heard, RRR. No JVD, murmurs, rubs, gallops or clicks. No pedal edema. Gastrointestinal system: Abdomen is nondistended, soft and nontender. No organomegaly or masses felt. Normal bowel sounds heard. Central nervous system: Alert and oriented. No focal neurological deficits. Extremities: Left hand with some erythema, some edema in the left hand.  Skin: Some erythema, edema in the left hand.  Bite incisions noted. Psychiatry: Judgement and insight appear normal. Mood & affect appropriate.     Data Reviewed: I have personally reviewed following labs and imaging studies  CBC: Recent Labs  Lab 11/21/19 1135 11/22/19 0719  WBC 7.5 5.8  NEUTROABS 5.6  --   HGB 13.5 13.7  HCT 40.7 41.9  MCV 97.4 96.8  PLT 296 798    Basic Metabolic Panel: Recent Labs  Lab 11/21/19 1135 11/22/19 0552  NA 136 136  K 4.2 3.5  CL 100 102  CO2 28 27  GLUCOSE 112* 164*  BUN 15 10  CREATININE 0.81 0.77  CALCIUM 9.0 9.1    GFR: Estimated Creatinine Clearance: 59.7 mL/min (by C-G formula based on SCr of 0.77 mg/dL).  Liver Function Tests: Recent Labs  Lab 11/21/19 1135 11/22/19 0552  AST 373* 207*  ALT 298* 260*  ALKPHOS 158* 146*  BILITOT 0.7 0.8  PROT 7.4 7.2  ALBUMIN 4.1 4.0    CBG: No results for input(s): GLUCAP in the last 168 hours.   Recent Results (from the past 240 hour(s))  Respiratory Panel by RT PCR (Flu A&B, Covid) - Nasopharyngeal Swab     Status: None   Collection Time: 11/21/19  2:32 PM   Specimen: Nasopharyngeal Swab  Result Value Ref Range  Status   SARS Coronavirus 2 by RT PCR NEGATIVE NEGATIVE Final    Comment: (NOTE) SARS-CoV-2 target nucleic acids are NOT DETECTED.  The SARS-CoV-2 RNA is generally detectable in upper respiratoy specimens during the acute phase of infection. The lowest concentration of SARS-CoV-2 viral copies this assay can detect is 131 copies/mL. A negative result does not preclude SARS-Cov-2 infection and should not be used as the sole basis for treatment or other patient management decisions. A negative result may occur with  improper specimen collection/handling, submission of specimen other than nasopharyngeal swab, presence of viral mutation(s) within the areas targeted by this assay, and inadequate number of viral copies (<131 copies/mL). A negative result must be combined with clinical observations, patient history, and epidemiological information. The expected result is Negative.  Fact Sheet for Patients:  PinkCheek.be  Fact Sheet for Healthcare Providers:  GravelBags.it  This test is no t yet approved or cleared by the [place] and  has been authorized for detection and/or diagnosis of SARS-CoV-2 by FDA under an Emergency Use Authorization (EUA). This EUA will remain  in effect (meaning this test can be used) for the duration of the COVID-19 declaration under Section 564(b)(1) of the Act, 21 U.S.C. section 360bbb-3(b)(1), unless the authorization is terminated or revoked sooner.     Influenza A by PCR NEGATIVE NEGATIVE Final   Influenza B by PCR NEGATIVE NEGATIVE Final    Comment: (NOTE) The Xpert Xpress SARS-CoV-2/FLU/RSV assay is intended as an aid in  the diagnosis of influenza from Nasopharyngeal swab specimens and  should not be used as a sole basis for treatment. Nasal washings and  aspirates are unacceptable for Xpert Xpress SARS-CoV-2/FLU/RSV  testing.  Fact Sheet for  Patients: PinkCheek.be  Fact Sheet for Healthcare Providers: GravelBags.it  This test is not yet approved or cleared by the [place] FDA and  has been authorized for detection and/or diagnosis of SARS-CoV-2 by  FDA under an Emergency Use Authorization (EUA). This EUA will remain  in effect (meaning this test can be used) for the duration of the  Covid-19 declaration under Section 564(b)(1) of the Act, 21  U.S.C. section 360bbb-3(b)(1), unless the authorization is  terminated or revoked. Performed at [facility], [place] [address] [place], [place] [address]          Radiology Studies: DG Wrist Complete Left  Result Date: 11/21/2019 CLINICAL DATA:  Swelling post cat bites. EXAM: LEFT WRIST - COMPLETE 3+ VIEW COMPARISON:  None. FINDINGS: No fracture or dislocation. Normal mineralization. Mild degenerative changes in the first [facility], radiocarpal articulation and distal ulna. Carpal rows intact. There is diffuse soft tissue swelling. No radiodense foreign body or subcutaneous gas. IMPRESSION: Soft tissue swelling without fracture, foreign body, or other acute bone abnormality. Electronically Signed   By: Lucrezia Europe M.D.   On: 11/21/2019 13:24   US Abdomen Complete  Result Date: 11/22/2019 CLINICAL DATA:  Elevated LFT EXAM: ABDOMEN ULTRASOUND COMPLETE COMPARISON:  None. FINDINGS: Gallbladder: Cholelithiasis. Largest gallstone 9 mm. No gallbladder wall thickening. Negative for sonographic Murphy sign. Common bile duct: Diameter: Markedly dilated 19 mm. 5.7 mm calculus distal common bile duct. Intrahepatic bile duct dilatation also noted. Liver: No focal lesion identified. Within normal limits in parenchymal echogenicity. Portal vein is patent on color Doppler imaging with normal direction of blood flow towards the liver. IVC: No abnormality visualized. Pancreas: Visualized portion unremarkable. Spleen: Size and  appearance within normal limits. Right Kidney: Length: 10.0 mm. Echogenicity within normal limits. No mass or hydronephrosis visualized. Left Kidney: Length: 10.1 mm. Echogenicity within normal limits. No mass or hydronephrosis visualized. Abdominal aorta: No aneurysm visualized. Other findings: None. IMPRESSION: Cholelithiasis without evidence of cholecystitis. Markedly dilated common bile duct with distal common bile duct stone causing obstruction. Electronically Signed   By: Franchot Gallo M.D.   On: 11/22/2019 08:01        Scheduled Meds: . enoxaparin (LOVENOX) injection  40 mg Subcutaneous Q24H  . metroNIDAZOLE  500 mg Oral Q8H  . pantoprazole  40 mg Oral Q0600  . pneumococcal 23 valent vaccine  0.5 mL Intramuscular Tomorrow-1000  . saccharomyces boulardii  250 mg Oral BID   Continuous Infusions: . ciprofloxacin 400 mg (11/22/19 0509)     LOS: 1 day    Time spent: 40 minutes    Irine Seal, MD [facility]   To contact the attending provider between 7A-7P or the covering provider during after hours 7P-7A, please log  into the web site [email or web address] and access using universal [place] password for that web site. If you do not have the password, please call the hospital operator.  11/22/2019, 10:33 AM

## 2019-11-22 NOTE — Plan of Care (Signed)
  Problem: Clinical Measurements: Goal: Ability to avoid or minimize complications of infection will improve Outcome: Progressing   Problem: Skin Integrity: Goal: Skin integrity will improve Outcome: Progressing   Problem: Education: Goal: Knowledge of General Education information will improve Description: Including pain rating scale, medication(s)/side effects and non-pharmacologic comfort measures Outcome: Progressing   

## 2019-11-22 NOTE — Anesthesia Preprocedure Evaluation (Addendum)
Anesthesia Evaluation  Patient identified by MRN, date of birth, ID band Patient awake    Reviewed: Allergy & Precautions, NPO status , Patient's Chart, lab work & pertinent test results  Airway Mallampati: II  TM Distance: >3 FB Neck ROM: Full    Dental no notable dental hx. (+) Teeth Intact, Dental Advisory Given   Pulmonary neg pulmonary ROS, former smoker,    Pulmonary exam normal breath sounds clear to auscultation       Cardiovascular Exercise Tolerance: Good negative cardio ROS Normal cardiovascular exam Rhythm:Regular Rate:Normal     Neuro/Psych  Neuromuscular disease    GI/Hepatic Neg liver ROS, hiatal hernia, GERD  ,  Endo/Other  negative endocrine ROS  Renal/GU      Musculoskeletal negative musculoskeletal ROS (+)   Abdominal   Peds  Hematology negative hematology ROS (+)   Anesthesia Other Findings Esophageal CA  Reproductive/Obstetrics                            Anesthesia Physical Anesthesia Plan  ASA: III  Anesthesia Plan: General   Post-op Pain Management:    Induction: Intravenous  PONV Risk Score and Plan: Treatment may vary due to age or medical condition, Ondansetron and Dexamethasone  Airway Management Planned: Oral ETT  Additional Equipment: None  Intra-op Plan:   Post-operative Plan: Extubation in OR  Informed Consent: I have reviewed the patients History and Physical, chart, labs and discussed the procedure including the risks, benefits and alternatives for the proposed anesthesia with the patient or authorized representative who has indicated his/her understanding and acceptance.     Dental advisory given  Plan Discussed with: CRNA and Anesthesiologist  Anesthesia Plan Comments: (GA )       Anesthesia Quick Evaluation

## 2019-11-22 NOTE — Progress Notes (Signed)
Patient had a reaction to amoxicillin within 20 minutes of receiving the pill patient started vomiting and having chills Benadryl and zofran were administered and MD notified.

## 2019-11-22 NOTE — Consult Note (Signed)
Reason for Consult: Choledocholithiasis Referring Physician: [facility]  Larena Glassman HPI: This is a 64 year old female with a PMH of esophageal cancer s/p esophagectomy with an esophago-gastric anastomosis at [facility], history of anastomotic strictures, GERD, hyperlipidemia and depression admitted for a cellulitis associated with a cat bite.  She was not improving with the antibiotics at home and she subsequently presented to the ER.  Further work up also revealed an incidental elevation in her liver enzymes.  Imaging revealed cholelithiasis, choledocholithiasis, CBD dilation up to 19 mm, and intrahepatic biliary ductal dilation.  The patient denies any pain with the stones and she does not report any history of fever.  No other abnormalities noted with the ultrasound.  Past Medical History:  Diagnosis Date  . 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 Chicken pox as a child  . Depression    been on antidepressants on and off for 20 yr  . GERD (gastroesophageal reflux disease)    silent with hiatal hernia  . Hiatal hernia 05/22/2013  . Hiatal hernia with gastroesophageal reflux 05/22/2013  . Hot tub folliculitis 28/07/8674  . Hyperglycemia 05/19/2016  . Hyperlipidemia 05/19/2016  . Measles as a child  . Postmenopausal atrophic vaginitis 05/22/2013  . Preventative health care 01/19/2015  . Shingles 64 yrs old  . Unspecified vitamin D deficiency 05/22/2013    Past Surgical History:  Procedure Laterality Date  . ABDOMINAL HYSTERECTOMY  1998   partial-still has ovaries  . ESOPHAGECTOMY  05-2008  . INDUCED ABORTION  1979    Family History  Problem Relation Age of Onset  . Cancer Mother 28       breast cancer  . Emphysema Mother        smoker  . Heart attack Father        X several  . Mental illness Brother        bipolar, xanax and thc abuse  . Obesity Brother   . Depression Daughter   . Anxiety disorder Daughter   . Other Maternal Grandmother        blood clot, dvt, lung   . Cancer Paternal Grandmother        stomach  . Heart disease Paternal Grandfather     Social History:  reports that she has quit smoking. Her smoking use included cigarettes. She started smoking about 21 years ago. She has a 2.50 pack-year smoking history. She has never used smokeless tobacco. She reports current alcohol use. She reports that she does not use drugs.  Allergies:  Allergies  Allergen Reactions  . Penicillins Hives and Nausea And Vomiting    PCN - Hives when she was 14 Amoxicillin - vomiting and chills (11/22/19)      Medications:  Scheduled: . enoxaparin (LOVENOX) injection  40 mg Subcutaneous Q24H  . metroNIDAZOLE  500 mg Oral Q8H  . pantoprazole  40 mg Oral Q0600  . saccharomyces boulardii  250 mg Oral BID  . [START ON 11/23/2019] venlafaxine XR  150 mg Oral Q breakfast   Continuous: . ciprofloxacin 400 mg (11/22/19 0509)    Results for orders placed or performed during the hospital encounter of 11/21/19 (from the past 24 hour(s))  Comprehensive metabolic panel     Status: Abnormal   Collection Time: 11/22/19  5:52 AM  Result Value Ref Range   Sodium 136 135 - 145 mmol/L   Potassium 3.5 3.5 - 5.1 mmol/L   Chloride 102 98 - 111 mmol/L   CO2  27 22 - 32 mmol/L   Glucose, Bld 164 (H) 70 - 99 mg/dL   BUN 10 8 - 23 mg/dL   Creatinine, Ser 0.77 0.44 - 1.00 mg/dL   Calcium 9.1 8.9 - 10.3 mg/dL   Total Protein 7.2 6.5 - 8.1 g/dL   Albumin 4.0 3.5 - 5.0 g/dL   AST 207 (H) 15 - 41 U/L   ALT 260 (H) 0 - 44 U/L   Alkaline Phosphatase 146 (H) 38 - 126 U/L   Total Bilirubin 0.8 0.3 - 1.2 mg/dL   GFR, Estimated >60 >60 mL/min   Anion gap 7 5 - 15  Hepatitis panel, acute     Status: None   Collection Time: 11/22/19  5:52 AM  Result Value Ref Range   Hepatitis B Surface Ag NON REACTIVE NON REACTIVE   HCV Ab NON REACTIVE NON REACTIVE   Hep A IgM NON REACTIVE NON REACTIVE   Hep B C IgM NON REACTIVE NON REACTIVE  CBC     Status: None   Collection Time: 11/22/19   7:19 AM  Result Value Ref Range   WBC 5.8 4.0 - 10.5 K/uL   RBC 4.33 3.87 - 5.11 MIL/uL   Hemoglobin 13.7 12.0 - 15.0 g/dL   HCT 41.9 36 - 46 %   MCV 96.8 80.0 - 100.0 fL   MCH 31.6 26.0 - 34.0 pg   MCHC 32.7 30.0 - 36.0 g/dL   RDW 14.2 11.5 - 15.5 %   Platelets 288 150 - 400 K/uL   nRBC 0.0 0.0 - 0.2 %     DG Wrist Complete Left  Result Date: 11/21/2019 CLINICAL DATA:  Swelling post cat bites. EXAM: LEFT WRIST - COMPLETE 3+ VIEW COMPARISON:  None. FINDINGS: No fracture or dislocation. Normal mineralization. Mild degenerative changes in the first [facility], radiocarpal articulation and distal ulna. Carpal rows intact. There is diffuse soft tissue swelling. No radiodense foreign body or subcutaneous gas. IMPRESSION: Soft tissue swelling without fracture, foreign body, or other acute bone abnormality. Electronically Signed   By: Lucrezia Europe M.D.   On: 11/21/2019 13:24   US Abdomen Complete  Result Date: 11/22/2019 CLINICAL DATA:  Elevated LFT EXAM: ABDOMEN ULTRASOUND COMPLETE COMPARISON:  None. FINDINGS: Gallbladder: Cholelithiasis. Largest gallstone 9 mm. No gallbladder wall thickening. Negative for sonographic Murphy sign. Common bile duct: Diameter: Markedly dilated 19 mm. 5.7 mm calculus distal common bile duct. Intrahepatic bile duct dilatation also noted. Liver: No focal lesion identified. Within normal limits in parenchymal echogenicity. Portal vein is patent on color Doppler imaging with normal direction of blood flow towards the liver. IVC: No abnormality visualized. Pancreas: Visualized portion unremarkable. Spleen: Size and appearance within normal limits. Right Kidney: Length: 10.0 mm. Echogenicity within normal limits. No mass or hydronephrosis visualized. Left Kidney: Length: 10.1 mm. Echogenicity within normal limits. No mass or hydronephrosis visualized. Abdominal aorta: No aneurysm visualized. Other findings: None. IMPRESSION: Cholelithiasis without evidence of cholecystitis. Markedly  dilated common bile duct with distal common bile duct stone causing obstruction. Electronically Signed   By: Franchot Gallo M.D.   On: 11/22/2019 08:01    ROS:  As stated above in the HPI otherwise negative.  Blood pressure 132/84, pulse 74, temperature 97.8 F (36.6 C), temperature source Oral, resp. rate 18, height 5\' 4"  (1.626 m), weight 56.7 kg, SpO2 99 %.    PE: Gen: NAD, Alert and Oriented HEENT:  [place]/AT, EOMI Neck: Supple, no LAD Lungs: CTA Bilaterally CV: RRR without M/G/R ABD: Soft,  NTND, +BS Ext: No C/C/E  Assessment/Plan: 1) Choledocholithiasis. 2) Cholelithiasis. 3) S/p esophagectomy with a history of anastomotic stricture, but no current complaints of dysphagia.   Mrs. Harbach is well-known to me from her history of the esophageal cancer and subsequent treatment of her esophageal strictures.  The ERCP was discussed in great detail to her and her husband as well as alternatives.  Two potential challenges is the patency of the anastomosis to allow for passage of the duodenoscope and the changed anatomy with the gastric pull-through.  These issues cannot be discerned until the time of the procedure.  A forward-viewing endoscope will be used first to assess for the anastomotic patency.  Plan: 1) ERCP with stone extraction tomorrow. 2) Hold Lovenox.  Argyle Gustafson D 11/22/2019, 5:24 PM

## 2019-11-22 NOTE — Progress Notes (Signed)
Pharmacy Penicillin Allergy Assessment  Spoke with patient to clarify penicillin allergy. Patient states she experienced hives once when taking penicillin as a teenager (50 years ago). She does not remember details and reports she has not taken any other penicillins since then.  After informing patient that penicillin allergies are often outgrown in adulthood, patient is open to trying an oral penicillin for her cellulitis such as Augmentin. Can consider this and administer first dose in hospital to monitor for any reactions.   Esmeralda Links (PharmD Candidate 2022)

## 2019-11-22 NOTE — H&P (View-Only) (Signed)
Reason for Consult: Choledocholithiasis Referring Physician: [facility]  Larena Glassman HPI: This is a 64 year old female with a PMH of esophageal cancer s/p esophagectomy with an esophago-gastric anastomosis at [facility], history of anastomotic strictures, GERD, hyperlipidemia and depression admitted for a cellulitis associated with a cat bite.  She was not improving with the antibiotics at home and she subsequently presented to the ER.  Further work up also revealed an incidental elevation in her liver enzymes.  Imaging revealed cholelithiasis, choledocholithiasis, CBD dilation up to 19 mm, and intrahepatic biliary ductal dilation.  The patient denies any pain with the stones and she does not report any history of fever.  No other abnormalities noted with the ultrasound.  Past Medical History:  Diagnosis Date  . 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 Chicken pox as a child  . Depression    been on antidepressants on and off for 20 yr  . GERD (gastroesophageal reflux disease)    silent with hiatal hernia  . Hiatal hernia 05/22/2013  . Hiatal hernia with gastroesophageal reflux 05/22/2013  . Hot tub folliculitis 94/08/5460  . Hyperglycemia 05/19/2016  . Hyperlipidemia 05/19/2016  . Measles as a child  . Postmenopausal atrophic vaginitis 05/22/2013  . Preventative health care 01/19/2015  . Shingles 64 yrs old  . Unspecified vitamin D deficiency 05/22/2013    Past Surgical History:  Procedure Laterality Date  . ABDOMINAL HYSTERECTOMY  1998   partial-still has ovaries  . ESOPHAGECTOMY  05-2008  . INDUCED ABORTION  1979    Family History  Problem Relation Age of Onset  . Cancer Mother 41       breast cancer  . Emphysema Mother        smoker  . Heart attack Father        X several  . Mental illness Brother        bipolar, xanax and thc abuse  . Obesity Brother   . Depression Daughter   . Anxiety disorder Daughter   . Other Maternal Grandmother        blood clot, dvt, lung   . Cancer Paternal Grandmother        stomach  . Heart disease Paternal Grandfather     Social History:  reports that she has quit smoking. Her smoking use included cigarettes. She started smoking about 21 years ago. She has a 2.50 pack-year smoking history. She has never used smokeless tobacco. She reports current alcohol use. She reports that she does not use drugs.  Allergies:  Allergies  Allergen Reactions  . Penicillins Hives and Nausea And Vomiting    PCN - Hives when she was 14 Amoxicillin - vomiting and chills (11/22/19)      Medications:  Scheduled: . enoxaparin (LOVENOX) injection  40 mg Subcutaneous Q24H  . metroNIDAZOLE  500 mg Oral Q8H  . pantoprazole  40 mg Oral Q0600  . saccharomyces boulardii  250 mg Oral BID  . [START ON 11/23/2019] venlafaxine XR  150 mg Oral Q breakfast   Continuous: . ciprofloxacin 400 mg (11/22/19 0509)    Results for orders placed or performed during the hospital encounter of 11/21/19 (from the past 24 hour(s))  Comprehensive metabolic panel     Status: Abnormal   Collection Time: 11/22/19  5:52 AM  Result Value Ref Range   Sodium 136 135 - 145 mmol/L   Potassium 3.5 3.5 - 5.1 mmol/L   Chloride 102 98 - 111 mmol/L   CO2  27 22 - 32 mmol/L   Glucose, Bld 164 (H) 70 - 99 mg/dL   BUN 10 8 - 23 mg/dL   Creatinine, Ser 0.77 0.44 - 1.00 mg/dL   Calcium 9.1 8.9 - 10.3 mg/dL   Total Protein 7.2 6.5 - 8.1 g/dL   Albumin 4.0 3.5 - 5.0 g/dL   AST 207 (H) 15 - 41 U/L   ALT 260 (H) 0 - 44 U/L   Alkaline Phosphatase 146 (H) 38 - 126 U/L   Total Bilirubin 0.8 0.3 - 1.2 mg/dL   GFR, Estimated >60 >60 mL/min   Anion gap 7 5 - 15  Hepatitis panel, acute     Status: None   Collection Time: 11/22/19  5:52 AM  Result Value Ref Range   Hepatitis B Surface Ag NON REACTIVE NON REACTIVE   HCV Ab NON REACTIVE NON REACTIVE   Hep A IgM NON REACTIVE NON REACTIVE   Hep B C IgM NON REACTIVE NON REACTIVE  CBC     Status: None   Collection Time: 11/22/19   7:19 AM  Result Value Ref Range   WBC 5.8 4.0 - 10.5 K/uL   RBC 4.33 3.87 - 5.11 MIL/uL   Hemoglobin 13.7 12.0 - 15.0 g/dL   HCT 41.9 36 - 46 %   MCV 96.8 80.0 - 100.0 fL   MCH 31.6 26.0 - 34.0 pg   MCHC 32.7 30.0 - 36.0 g/dL   RDW 14.2 11.5 - 15.5 %   Platelets 288 150 - 400 K/uL   nRBC 0.0 0.0 - 0.2 %     DG Wrist Complete Left  Result Date: 11/21/2019 CLINICAL DATA:  Swelling post cat bites. EXAM: LEFT WRIST - COMPLETE 3+ VIEW COMPARISON:  None. FINDINGS: No fracture or dislocation. Normal mineralization. Mild degenerative changes in the first [facility], radiocarpal articulation and distal ulna. Carpal rows intact. There is diffuse soft tissue swelling. No radiodense foreign body or subcutaneous gas. IMPRESSION: Soft tissue swelling without fracture, foreign body, or other acute bone abnormality. Electronically Signed   By: Lucrezia Europe M.D.   On: 11/21/2019 13:24   US Abdomen Complete  Result Date: 11/22/2019 CLINICAL DATA:  Elevated LFT EXAM: ABDOMEN ULTRASOUND COMPLETE COMPARISON:  None. FINDINGS: Gallbladder: Cholelithiasis. Largest gallstone 9 mm. No gallbladder wall thickening. Negative for sonographic Murphy sign. Common bile duct: Diameter: Markedly dilated 19 mm. 5.7 mm calculus distal common bile duct. Intrahepatic bile duct dilatation also noted. Liver: No focal lesion identified. Within normal limits in parenchymal echogenicity. Portal vein is patent on color Doppler imaging with normal direction of blood flow towards the liver. IVC: No abnormality visualized. Pancreas: Visualized portion unremarkable. Spleen: Size and appearance within normal limits. Right Kidney: Length: 10.0 mm. Echogenicity within normal limits. No mass or hydronephrosis visualized. Left Kidney: Length: 10.1 mm. Echogenicity within normal limits. No mass or hydronephrosis visualized. Abdominal aorta: No aneurysm visualized. Other findings: None. IMPRESSION: Cholelithiasis without evidence of cholecystitis. Markedly  dilated common bile duct with distal common bile duct stone causing obstruction. Electronically Signed   By: Franchot Gallo M.D.   On: 11/22/2019 08:01    ROS:  As stated above in the HPI otherwise negative.  Blood pressure 132/84, pulse 74, temperature 97.8 F (36.6 C), temperature source Oral, resp. rate 18, height 5\' 4"  (1.626 m), weight 56.7 kg, SpO2 99 %.    PE: Gen: NAD, Alert and Oriented HEENT:  [place]/AT, EOMI Neck: Supple, no LAD Lungs: CTA Bilaterally CV: RRR without M/G/R ABD: Soft,  NTND, +BS Ext: No C/C/E  Assessment/Plan: 1) Choledocholithiasis. 2) Cholelithiasis. 3) S/p esophagectomy with a history of anastomotic stricture, but no current complaints of dysphagia.   Mrs. Oborn is well-known to me from her history of the esophageal cancer and subsequent treatment of her esophageal strictures.  The ERCP was discussed in great detail to her and her husband as well as alternatives.  Two potential challenges is the patency of the anastomosis to allow for passage of the duodenoscope and the changed anatomy with the gastric pull-through.  These issues cannot be discerned until the time of the procedure.  A forward-viewing endoscope will be used first to assess for the anastomotic patency.  Plan: 1) ERCP with stone extraction tomorrow. 2) Hold Lovenox.  Leondro Coryell D 11/22/2019, 5:24 PM

## 2019-11-23 ENCOUNTER — Inpatient Hospital Stay (HOSPITAL_COMMUNITY): Payer: Medicare HMO | Admitting: Certified Registered"

## 2019-11-23 ENCOUNTER — Inpatient Hospital Stay (HOSPITAL_COMMUNITY): Payer: Medicare HMO

## 2019-11-23 ENCOUNTER — Encounter (HOSPITAL_COMMUNITY)
Admission: EM | Disposition: A | Discharge status: Home / Self Care | Payer: Self-pay | Source: Home / Self Care | Attending: Internal Medicine

## 2019-11-23 ENCOUNTER — Encounter (HOSPITAL_COMMUNITY): Payer: Self-pay

## 2019-11-23 ENCOUNTER — Encounter (HOSPITAL_COMMUNITY): Payer: Self-pay | Admitting: Internal Medicine

## 2019-11-23 DIAGNOSIS — K8051 Calculus of bile duct without cholangitis or cholecystitis with obstruction: Secondary | ICD-10-CM | POA: Diagnosis not present

## 2019-11-23 DIAGNOSIS — L03114 Cellulitis of left upper limb: Secondary | ICD-10-CM | POA: Diagnosis not present

## 2019-11-23 DIAGNOSIS — K219 Gastro-esophageal reflux disease without esophagitis: Secondary | ICD-10-CM | POA: Diagnosis not present

## 2019-11-23 DIAGNOSIS — F339 Major depressive disorder, recurrent, unspecified: Secondary | ICD-10-CM | POA: Diagnosis not present

## 2019-11-23 HISTORY — PX: REMOVAL OF STONES: SHX5545

## 2019-11-23 HISTORY — PX: BILIARY STENT PLACEMENT: SHX5538

## 2019-11-23 HISTORY — PX: SPHINCTEROTOMY: SHX5544

## 2019-11-23 HISTORY — PX: ERCP: SHX5425

## 2019-11-23 LAB — COMPREHENSIVE METABOLIC PANEL
ALT: 191 U/L — ABNORMAL HIGH (ref 0–44)
AST: 103 U/L — ABNORMAL HIGH (ref 15–41)
Albumin: 3.7 g/dL (ref 3.5–5.0)
Alkaline Phosphatase: 143 U/L — ABNORMAL HIGH (ref 38–126)
Anion gap: 7 (ref 5–15)
BUN: 12 mg/dL (ref 8–23)
CO2: 25 mmol/L (ref 22–32)
Calcium: 8.6 mg/dL — ABNORMAL LOW (ref 8.9–10.3)
Chloride: 100 mmol/L (ref 98–111)
Creatinine, Ser: 0.84 mg/dL (ref 0.44–1.00)
GFR, Estimated: >60 mL/min (ref >60)
Glucose, Bld: 99 mg/dL (ref 70–99)
Potassium: 3.7 mmol/L (ref 3.5–5.1)
Sodium: 132 mmol/L — ABNORMAL LOW (ref 135–145)
Total Bilirubin: 0.5 mg/dL (ref 0.3–1.2)
Total Protein: 7.3 g/dL (ref 6.5–8.1)

## 2019-11-23 LAB — CBC WITH DIFFERENTIAL/PLATELET
Abs Immature Granulocytes: 0.02 10*3/uL (ref 0.00–0.07)
Basophils Absolute: 0 10*3/uL (ref 0.0–0.1)
Basophils Relative: 1 %
Eosinophils Absolute: 0.2 10*3/uL (ref 0.0–0.5)
Eosinophils Relative: 3 %
HCT: 40.8 % (ref 36.0–46.0)
Hemoglobin: 13 g/dL (ref 12.0–15.0)
Immature Granulocytes: 0 %
Lymphocytes Relative: 21 %
Lymphs Abs: 1.4 10*3/uL (ref 0.7–4.0)
MCH: 31.6 pg (ref 26.0–34.0)
MCHC: 31.9 g/dL (ref 30.0–36.0)
MCV: 99 fL (ref 80.0–100.0)
Monocytes Absolute: 0.7 10*3/uL (ref 0.1–1.0)
Monocytes Relative: 11 %
Neutro Abs: 4.3 10*3/uL (ref 1.7–7.7)
Neutrophils Relative %: 64 %
Platelets: 321 10*3/uL (ref 150–400)
RBC: 4.12 MIL/uL (ref 3.87–5.11)
RDW: 14.1 % (ref 11.5–15.5)
WBC: 6.7 10*3/uL (ref 4.0–10.5)
nRBC: 0 % (ref 0.0–0.2)

## 2019-11-23 LAB — SURGICAL PCR SCREEN
MRSA, PCR: NEGATIVE
Staphylococcus aureus: NEGATIVE

## 2019-11-23 LAB — MAGNESIUM: Magnesium: 2.1 mg/dL (ref 1.7–2.4)

## 2019-11-23 SURGERY — ERCP, WITH INTERVENTION IF INDICATED
Anesthesia: General

## 2019-11-23 MED ORDER — INDOMETHACIN 50 MG RE SUPP
RECTAL | Status: DC | PRN
  Administered 2019-11-23: 100 mg via RECTAL

## 2019-11-23 MED ORDER — CIPROFLOXACIN IN D5W 400 MG/200ML IV SOLN
INTRAVENOUS | Status: AC
  Filled 2019-11-23: qty 200

## 2019-11-23 MED ORDER — PROPOFOL 10 MG/ML IV BOLUS
INTRAVENOUS | Status: DC | PRN
  Administered 2019-11-23: 120 mg via INTRAVENOUS

## 2019-11-23 MED ORDER — DEXAMETHASONE SODIUM PHOSPHATE 10 MG/ML IJ SOLN
INTRAMUSCULAR | Status: DC | PRN
  Administered 2019-11-23: 10 mg via INTRAVENOUS

## 2019-11-23 MED ORDER — GLUCAGON HCL RDNA (DIAGNOSTIC) 1 MG IJ SOLR
INTRAMUSCULAR | Status: DC | PRN
  Administered 2019-11-23: .5 mg via INTRAVENOUS

## 2019-11-23 MED ORDER — FENTANYL CITRATE (PF) 100 MCG/2ML IJ SOLN
INTRAMUSCULAR | Status: AC
  Filled 2019-11-23: qty 2

## 2019-11-23 MED ORDER — GLUCAGON HCL RDNA (DIAGNOSTIC) 1 MG IJ SOLR
INTRAMUSCULAR | Status: AC
  Filled 2019-11-23: qty 1

## 2019-11-23 MED ORDER — MIDAZOLAM HCL 5 MG/5ML IJ SOLN
INTRAMUSCULAR | Status: DC | PRN
  Administered 2019-11-23: 2 mg via INTRAVENOUS

## 2019-11-23 MED ORDER — ONDANSETRON HCL 4 MG/2ML IJ SOLN
INTRAMUSCULAR | Status: DC | PRN
  Administered 2019-11-23: 4 mg via INTRAVENOUS

## 2019-11-23 MED ORDER — SODIUM CHLORIDE 0.9 % IV SOLN
INTRAVENOUS | Status: DC | PRN
  Administered 2019-11-23: 50 mL

## 2019-11-23 MED ORDER — MIDAZOLAM HCL 2 MG/2ML IJ SOLN
INTRAMUSCULAR | Status: AC
  Filled 2019-11-23: qty 2

## 2019-11-23 MED ORDER — LACTATED RINGERS IV SOLN: Freq: Once | INTRAVENOUS | Status: AC

## 2019-11-23 MED ORDER — FENTANYL CITRATE (PF) 100 MCG/2ML IJ SOLN
INTRAMUSCULAR | Status: DC | PRN
  Administered 2019-11-23 (×2): 50 ug via INTRAVENOUS

## 2019-11-23 MED ORDER — SODIUM CHLORIDE 0.9 % IV SOLN: INTRAVENOUS | Status: DC

## 2019-11-23 MED ORDER — LACTATED RINGERS IV SOLN
INTRAVENOUS | Status: AC | PRN
  Administered 2019-11-23: 10 mL via INTRAVENOUS

## 2019-11-23 MED ORDER — ROCURONIUM BROMIDE 10 MG/ML (PF) SYRINGE
PREFILLED_SYRINGE | INTRAVENOUS | Status: DC | PRN
  Administered 2019-11-23: 50 mg via INTRAVENOUS

## 2019-11-23 MED ORDER — LIDOCAINE 2% (20 MG/ML) 5 ML SYRINGE
INTRAMUSCULAR | Status: DC | PRN
  Administered 2019-11-23: 80 mg via INTRAVENOUS

## 2019-11-23 MED ORDER — LACTATED RINGERS IV SOLN: INTRAVENOUS | Status: DC | PRN

## 2019-11-23 MED ORDER — SUGAMMADEX SODIUM 200 MG/2ML IV SOLN
INTRAVENOUS | Status: DC | PRN
  Administered 2019-11-23: 125 mg via INTRAVENOUS

## 2019-11-23 MED ORDER — EPHEDRINE SULFATE-NACL 50-0.9 MG/10ML-% IV SOSY
PREFILLED_SYRINGE | INTRAVENOUS | Status: DC | PRN
  Administered 2019-11-23: 10 mg via INTRAVENOUS

## 2019-11-23 MED ORDER — INDOMETHACIN 50 MG RE SUPP
RECTAL | Status: AC
  Filled 2019-11-23: qty 2

## 2019-11-23 MED ORDER — PHENYLEPHRINE 40 MCG/ML (10ML) SYRINGE FOR IV PUSH (FOR BLOOD PRESSURE SUPPORT)
PREFILLED_SYRINGE | INTRAVENOUS | Status: DC | PRN
  Administered 2019-11-23 (×2): 80 ug via INTRAVENOUS

## 2019-11-23 NOTE — Anesthesia Procedure Notes (Signed)
Procedure Name: Intubation Date/Time: 11/23/2019 12:50 PM Performed by: Minami Arriaga D, CRNA Pre-anesthesia Checklist: Patient identified, Emergency Drugs available, Suction available and Patient being monitored Patient Re-evaluated:Patient Re-evaluated prior to induction Oxygen Delivery Method: Circle system utilized Preoxygenation: Pre-oxygenation with 100% oxygen Induction Type: IV induction Ventilation: Mask ventilation without difficulty Laryngoscope Size: Mac and 3 Grade View: Grade I Tube type: Oral Tube size: 7.0 mm Number of attempts: 1 Airway Equipment and Method: Stylet Placement Confirmation: ETT inserted through vocal cords under direct vision,  positive ETCO2 and breath sounds checked- equal and bilateral Secured at: 21 cm Tube secured with: Tape Dental Injury: Teeth and Oropharynx as per pre-operative assessment

## 2019-11-23 NOTE — Transfer of Care (Signed)
Immediate Anesthesia Transfer of Care Note  Patient: LOIS SLAGEL  Procedure(s) Performed: ENDOSCOPIC RETROGRADE CHOLANGIOPANCREATOGRAPHY (ERCP) (N/A ) SPHINCTEROTOMY BILIARY STENT PLACEMENT (N/A ) REMOVAL OF STONES  Patient Location: PACU  Anesthesia Type:General  Level of Consciousness: awake, alert  and oriented  Airway & Oxygen Therapy: Patient Spontanous Breathing and Patient connected to face mask oxygen  Post-op Assessment: Report given to RN and Post -op Vital signs reviewed and stable  Post vital signs: Reviewed and stable  Last Vitals:  Vitals Value Taken Time  BP 147/76 11/23/19 1400  Temp 37 C 11/23/19 1400  Pulse 93 11/23/19 1405  Resp 15 11/23/19 1405  SpO2 93 % 11/23/19 1405  Vitals shown include unvalidated device data.  Last Pain:  Vitals:   11/23/19 1400  TempSrc: Oral  PainSc: 0-No pain      Patients Stated Pain Goal: 2 (16/10/96 0454)  Complications: No complications documented.

## 2019-11-23 NOTE — Progress Notes (Signed)
Chaplain engaged in initial visit with Xela.  During visit, chaplain explained her role and offered support.  Demita shared what brought her into the hospital, which was an infected cat bite, but that she also later found out that she needed her gallstones removed.  Manuela Schwartz and chaplain discussed finding out about this new diagnosis while being in the hospital for something completely different.  Chaplain worked to celebrate catching this new issue before it yielded any pain or significant damage.    Dessiree expressed a great deal of compassion for the cat that bit her, which belongs to a friend of hers, noting that the cat may have just wanted to stay outside and play or may have felt some physical discomfort when she picked the cat up.  Annetta and her friend are still wondering about what will happen to the cat now.  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  Chaplain offered the ministries of support, presence and listening.  Chaplain will follow-up as needed.    11/23/19 1100  Clinical Encounter Type  Visited With Patient  Visit Type Initial

## 2019-11-23 NOTE — Progress Notes (Addendum)
PROGRESS NOTE    Lauren [name]  VOZ:[id number] DOB: 1955/11/11 DOA: 11/21/2019 PCP: [name], MD    Chief Complaint  Patient presents with  . Animal Bite  . Arm Swelling    Brief Narrative:  Patient is a pleasant 64 year old female history of depression/anxiety, hyperlipidemia presented left wrist swelling.  Patient had a cat bite 2 days prior to admission was seen at urgent care with lacerations which were sutured.  Patient given doxycycline, clindamycin and tramadol to these medications at home tried ice and elevation however due to increased swelling and persistent pain presented back to the ED.  Plain films done negative for any acute process.  Patient initially started on IV clindamycin.  Patient admitted and antibiotics changed to IV ciprofloxacin and Flagyl.  Patient noted to have a transaminitis.  Acute hepatitis panel pending.  Abdominal ultrasound concerning for choledocholithiasis, cholelithiasis.   Assessment & Plan:   Principal Problem:   Cellulitis Active Problems:   Choledocholithiasis with obstruction   Depression   Hiatal hernia with gastroesophageal reflux   Hyperlipidemia   1 left hand cellulitis secondary to cat bite Patient presented with left hand/left wrist swelling, erythema, tenderness to palpation recent cat bite 2 days prior to admission.  Patient was on oral doxycycline and clindamycin in the outpatient setting with no significant improvement and subsequently failed outpatient therapy.  Due to patient's history of penicillin allergy placed on IV ciprofloxacin and oral Flagyl after admitting physicians discussion with pharmacy.  Clinical improvement.  Afebrile.  Patient underwent penicillin challenge and stated she immediately developed significant nausea with some emesis.  Continue current antibiotics.  Continue pain control, ice.  Hopefully could transition to oral ciprofloxacin in the next 24 hours if continued improvement.   2.   Choledocholithiasis with obstruction/transaminitis Patient noted to have a significant transaminitis on admission.  Patient with no prior history of liver issues.  Patient with normal LFTs 04/11/2019.  Patient with no significant alcohol abuse.  Acute hepatitis panel negative.  Abdominal ultrasound  concerning for cholelithiasis without evidence of cholecystitis, markedly dilated common bile duct with distal common bile duct stone causing obstruction.  Patient currently asymptomatic.  LFTs trending down.  GI consulted, patient seen in consultation by Dr. [name] and patient for ERCP with stone extraction today.  Discontinue Lovenox.  Will eventually need cholecystectomy.  GI following and appreciate their input and recommendations.   3.  Hyperlipidemia Statin on hold secondary to transaminitis.  4.  Depression/anxiety Effexor.  5.  Hiatal hernia with GERD Continue PPI.  6.  History of esophageal cancer status post esophagectomy with esophagogastric anastomosis. Patient denies any dysphagia or odynophagia.  Currently stable.  Continue PPI.  GI following.   DVT prophylaxis: Lovenox Code Status: Full Family Communication: Updated patient.  No family at bedside. Disposition:   Status is: Inpatient    Dispo: The patient is from: Home              Anticipated d/c is to: Home              Anticipated d/c date is: 2 to 3 days.              Patient currently on IV antibiotics for left hand cellulitis, transaminitis with abdominal ultrasound concerning for choledocholithiasis with obstruction.  Not stable for discharge.       Consultants:   Gastroenterology: Dr. [name] 11/22/2019  Procedures:   Plain films of the left wrist 11/21/2019  Abdominal ultrasound 11/22/2019--- cholelithiasis without  evidence of cholecystitis.  Markedly dilated common bile duct with distal common bile duct stone causing obstruction.  ERCP pending 11/23/2019  Antimicrobials:   Oral Flagyl 11/21/2019  IV  ciprofloxacin 11/21/2019   Subjective: Sleeping but arousable.  Denies any chest pain or shortness of breath.  No abdominal pain.  No nausea or vomiting.  Left hand pain/swelling improving.  Patient however states pain medication helps with the left hand pain.  Patient stated developed significant nausea right after penicillin challenge yesterday.    Objective: Vitals:   11/22/19 1344 11/22/19 1617 11/22/19 2054 11/23/19 0448  BP: 118/65 132/84 (!) 114/54 130/77  Pulse: 70 74 73 71  Resp: 18  16 16   Temp: 98.5 F (36.9 C) 97.8 F (36.6 C) 98.1 F (36.7 C) (!) 97.4 F (36.3 C)  TempSrc: Oral Oral Oral Oral  SpO2: 99%  96% 96%  Weight:      Height:        Intake/Output Summary (Last 24 hours) at 11/23/2019 0751 Last data filed at 11/23/2019 0600 Gross per 24 hour  Intake 1242.8 ml  Output 2000 ml  Net -757.2 ml   Filed Weights   11/21/19 1046  Weight: 56.7 kg    Examination:  General exam: NAD Respiratory system: CTAB.  No wheezes, no crackles, no rhonchi.  Normal respiratory effort.   Cardiovascular system: Regular rate rhythm no murmurs rubs or gallops.  No JVD.  No lower extremity edema.  Gastrointestinal system: Abdomen is soft, nontender, nondistended, positive bowel sounds.  No rebound.  No guarding.  Central nervous system: Alert and oriented. No focal neurological deficits. Extremities: Left hand with less erythema, less swelling, less tender to palpation.  Skin: Decreasing erythema and edema in the left hand.  Bite site noted.  Psychiatry: Judgement and insight appear normal. Mood & affect appropriate.     Data Reviewed: I have personally reviewed following labs and imaging studies  CBC: Recent Labs  Lab 11/21/19 1135 11/22/19 0719 11/23/19 0455  WBC 7.5 5.8 6.7  NEUTROABS 5.6  --  4.3  HGB 13.5 13.7 13.0  HCT 40.7 41.9 40.8  MCV 97.4 96.8 99.0  PLT 296 288 595    Basic Metabolic Panel: Recent Labs  Lab 11/21/19 1135 11/22/19 0552  11/23/19 0455  NA 136 136 132*  K 4.2 3.5 3.7  CL 100 102 100  CO2 28 27 25   GLUCOSE 112* 164* 99  BUN 15 10 12   CREATININE 0.81 0.77 0.84  CALCIUM 9.0 9.1 8.6*  MG  --   --  2.1    GFR: Estimated Creatinine Clearance: 56.9 mL/min (by C-G formula based on SCr of 0.84 mg/dL).  Liver Function Tests: Recent Labs  Lab 11/21/19 1135 11/22/19 0552 11/23/19 0455  AST 373* 207* 103*  ALT 298* 260* 191*  ALKPHOS 158* 146* 143*  BILITOT 0.7 0.8 0.5  PROT 7.4 7.2 7.3  ALBUMIN 4.1 4.0 3.7    CBG: No results for input(s): GLUCAP in the last 168 hours.   Recent Results (from the past 240 hour(s))  Respiratory Panel by RT PCR (Flu A&B, Covid) - Nasopharyngeal Swab     Status: None   Collection Time: 11/21/19  2:32 PM   Specimen: Nasopharyngeal Swab  Result Value Ref Range Status   SARS Coronavirus 2 by RT PCR NEGATIVE NEGATIVE Final    Comment: (NOTE) SARS-CoV-2 target nucleic acids are NOT DETECTED.  The SARS-CoV-2 RNA is generally detectable in upper respiratoy specimens during the acute phase of  infection. The lowest concentration of SARS-CoV-2 viral copies this assay can detect is 131 copies/mL. A negative result does not preclude SARS-Cov-2 infection and should not be used as the sole basis for treatment or other patient management decisions. A negative result may occur with  improper specimen collection/handling, submission of specimen other than nasopharyngeal swab, presence of viral mutation(s) within the areas targeted by this assay, and inadequate number of viral copies (<131 copies/mL). A negative result must be combined with clinical observations, patient history, and epidemiological information. The expected result is Negative.  Fact Sheet for Patients:  PinkCheek.be  Fact Sheet for Healthcare Providers:  GravelBags.it  This test is no t yet approved or cleared by the [place] FDA and  has been  authorized for detection and/or diagnosis of SARS-CoV-2 by FDA under an Emergency Use Authorization (EUA). This EUA will remain  in effect (meaning this test can be used) for the duration of the COVID-19 declaration under Section 564(b)(1) of the Act, 21 U.S.C. section 360bbb-3(b)(1), unless the authorization is terminated or revoked sooner.     Influenza A by PCR NEGATIVE NEGATIVE Final   Influenza B by PCR NEGATIVE NEGATIVE Final    Comment: (NOTE) The Xpert Xpress SARS-CoV-2/FLU/RSV assay is intended as an aid in  the diagnosis of influenza from Nasopharyngeal swab specimens and  should not be used as a sole basis for treatment. Nasal washings and  aspirates are unacceptable for Xpert Xpress SARS-CoV-2/FLU/RSV  testing.  Fact Sheet for Patients: PinkCheek.be  Fact Sheet for Healthcare Providers: GravelBags.it  This test is not yet approved or cleared by the [place] FDA and  has been authorized for detection and/or diagnosis of SARS-CoV-2 by  FDA under an Emergency Use Authorization (EUA). This EUA will remain  in effect (meaning this test can be used) for the duration of the  Covid-19 declaration under Section 564(b)(1) of the Act, 21  U.S.C. section 360bbb-3(b)(1), unless the authorization is  terminated or revoked. Performed at [facility], [place] [address], [place], [place] [address]          Radiology Studies: DG Wrist Complete Left  Result Date: 11/21/2019 CLINICAL DATA:  Swelling post cat bites. EXAM: LEFT WRIST - COMPLETE 3+ VIEW COMPARISON:  None. FINDINGS: No fracture or dislocation. Normal mineralization. Mild degenerative changes in the first [facility], radiocarpal articulation and distal ulna. Carpal rows intact. There is diffuse soft tissue swelling. No radiodense foreign body or subcutaneous gas. IMPRESSION: Soft tissue swelling without fracture, foreign body, or other acute bone  abnormality. Electronically Signed   By: Lucrezia Europe M.D.   On: 11/21/2019 13:24   US Abdomen Complete  Result Date: 11/22/2019 CLINICAL DATA:  Elevated LFT EXAM: ABDOMEN ULTRASOUND COMPLETE COMPARISON:  None. FINDINGS: Gallbladder: Cholelithiasis. Largest gallstone 9 mm. No gallbladder wall thickening. Negative for sonographic Murphy sign. Common bile duct: Diameter: Markedly dilated 19 mm. 5.7 mm calculus distal common bile duct. Intrahepatic bile duct dilatation also noted. Liver: No focal lesion identified. Within normal limits in parenchymal echogenicity. Portal vein is patent on color Doppler imaging with normal direction of blood flow towards the liver. IVC: No abnormality visualized. Pancreas: Visualized portion unremarkable. Spleen: Size and appearance within normal limits. Right Kidney: Length: 10.0 mm. Echogenicity within normal limits. No mass or hydronephrosis visualized. Left Kidney: Length: 10.1 mm. Echogenicity within normal limits. No mass or hydronephrosis visualized. Abdominal aorta: No aneurysm visualized. Other findings: None. IMPRESSION: Cholelithiasis without evidence of cholecystitis. Markedly dilated common bile duct with  distal common bile duct stone causing obstruction. Electronically Signed   By: Franchot Gallo M.D.   On: 11/22/2019 08:01        Scheduled Meds: . metroNIDAZOLE  500 mg Oral Q8H  . pantoprazole  40 mg Oral Q0600  . saccharomyces boulardii  250 mg Oral BID  . venlafaxine XR  150 mg Oral Q breakfast   Continuous Infusions: . sodium chloride    . ciprofloxacin 400 mg (11/23/19 9811)     LOS: 2 days    Time spent: 35 minutes    Irine Seal, MD [facility]   To contact the attending provider between 7A-7P or the covering provider during after hours 7P-7A, please log into the web site [email or web address] and access using universal [place] password for that web site. If you do not have the password, please call the hospital  operator.  11/23/2019, 7:51 AM

## 2019-11-23 NOTE — Interval H&P Note (Signed)
History and Physical Interval Note:  11/23/2019 12:12 PM  Lauren [name]  has presented today for surgery, with the diagnosis of Bile duct stones.  The various methods of treatment have been discussed with the patient and family. After consideration of risks, benefits and other options for treatment, the patient has consented to  Procedure(s): ENDOSCOPIC RETROGRADE CHOLANGIOPANCREATOGRAPHY (ERCP) (N/A) as a surgical intervention.  The patient's history has been reviewed, patient examined, no change in status, stable for surgery.  I have reviewed the patient's chart and labs.  Questions were answered to the patient's satisfaction.     Shiro Ellerman D

## 2019-11-23 NOTE — Op Note (Signed)
[facility] Patient Name: Lauren [name] Procedure Date: 11/23/2019 MRN: [id number] Attending MD: Carol Ada , MD Date of Birth: [birth year] CSN: [id number] Age: 64 Admit Type: Inpatient Procedure:                ERCP Indications:              Bile duct stone(s) Providers:                Carol Ada, MD, Kary Kos RN, RN, Elspeth Cho Tech., Technician, Lesia Sago,                            Technician, [facility], CRNA Referring MD:              Medicines:                General Anesthesia Complications:            No immediate complications. Estimated Blood Loss:     Estimated blood loss was minimal. Procedure:                Pre-Anesthesia Assessment:                           - Prior to the procedure, a History and Physical                            was performed, and patient medications and                            allergies were reviewed. The patient's tolerance of                            previous anesthesia was also reviewed. The risks                            and benefits of the procedure and the sedation                            options and risks were discussed with the patient.                            All questions were answered, and informed consent                            was obtained. Prior Anticoagulants: The patient has                            taken no previous anticoagulant or antiplatelet                            agents. ASA Grade Assessment: II - A patient with  mild systemic disease. After reviewing the risks                            and benefits, the patient was deemed in                            satisfactory condition to undergo the procedure.                           - Sedation was administered by an anesthesia                            professional. General anesthesia was attained.                           After obtaining informed consent, the  scope was                            passed under direct vision. Throughout the                            procedure, the patient's blood pressure, pulse, and                            oxygen saturations were monitored continuously. The                            [id number]-H190 ([id number]) Olympus gastroscope was                            introduced through the mouth, and used to inject                            contrast into and used to inject contrast into the                            bile duct and dorsal pancreatic duct. The                            Duodenoscope was introduced through the and used to                            inject contrast into. The ERCP was technically                            difficult and complex. The patient tolerated the                            procedure well. Scope In: Scope Out: Findings:      The major papilla was normal. A long 0.025 inch Jagwire was passed into       the biliary tree. A 7 mm biliary sphincterotomy was made with a traction       (standard) sphincterotome using ERBE electrocautery. There was no  post-sphincterotomy bleeding. The biliary tree was swept with a 15 mm       balloon starting at the bifurcation. Debris was swept from the duct. One       8.5 Fr by 5 cm plastic stent with a single external flap and a single       internal flap was placed 4.5 cm into the common bile duct. Bile flowed       through the stent. The stent was in good position.      the anastomosis was widely patent. There was a significant amount of       fluid and vegetable matter in the stomach. The fluid was aggressively       suctioned. The EGD scope was exchanged for a duodenoscope. 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       Cannulation was difficult and a two wire technique was required. The       wire was left in the PD and this facilitated cannulation of the CBD. For       reasons that were not initially  clear the sphincterotome was not able to       pass through into the CBD. The 0.025 wire sphincterotome was used and       this did allow for cannulation of the CBD with some maneuvering.       Contrast injection revealed a very dilated CBD at 2 cm and there was a       cut off in the distal CBD. No stone was identified. The thought is that       the ampullary diverticulum is the source of the distal CBD obstruction.       The CBD was swept 4 times with a 12-15 mm balloon after a limited       sphincterotomy was created. Only a faint amount of sludge was removed.       The balloon was decreased in size at the ampulla to around 10-11 mm to       allow for the balloon to easily pass out of the CBD. With the current       findings, the decision was made to insert a stent. Excellent drainage       was achieved with stent insertion. Impression:               - The major papilla appeared normal.                           - A biliary sphincterotomy was performed.                           - The biliary tree was swept and debris was found.                           - One plastic stent was placed into the common bile                            duct. Moderate Sedation:      Not Applicable - Patient had care per Anesthesia. Recommendation:           - Return patient to hospital ward for ongoing care.                           -  Resume regular diet. Procedure Code(s):        --- Professional ---                           [phone], Endoscopic retrograde                            cholangiopancreatography (ERCP); with placement of                            endoscopic stent into biliary or pancreatic duct,                            including pre- and post-dilation and guide wire                            passage, when performed, including sphincterotomy,                            when performed, each stent                           [id number], Endoscopic retrograde                             cholangiopancreatography (ERCP); with removal of                            calculi/debris from biliary/pancreatic duct(s) Diagnosis Code(s):        --- Professional ---                           K80.50, Calculus of bile duct without cholangitis                            or cholecystitis without obstruction CPT copyright 2019 [employer]. All rights reserved. The codes documented in this report are preliminary and upon coder review may  be revised to meet current compliance requirements. Carol Ada, MD Carol Ada, MD 11/23/2019 2:02:02 PM This report has been signed electronically. Number of Addenda: 0

## 2019-11-24 DIAGNOSIS — R7989 Other specified abnormal findings of blood chemistry: Secondary | ICD-10-CM

## 2019-11-24 DIAGNOSIS — L03114 Cellulitis of left upper limb: Secondary | ICD-10-CM | POA: Diagnosis not present

## 2019-11-24 DIAGNOSIS — W5501XA Bitten by cat, initial encounter: Secondary | ICD-10-CM

## 2019-11-24 DIAGNOSIS — W5501XD Bitten by cat, subsequent encounter: Secondary | ICD-10-CM | POA: Diagnosis not present

## 2019-11-24 DIAGNOSIS — R945 Abnormal results of liver function studies: Secondary | ICD-10-CM

## 2019-11-24 DIAGNOSIS — K8051 Calculus of bile duct without cholangitis or cholecystitis with obstruction: Secondary | ICD-10-CM | POA: Diagnosis not present

## 2019-11-24 LAB — COMPREHENSIVE METABOLIC PANEL
ALT: 143 U/L — ABNORMAL HIGH (ref 0–44)
AST: 57 U/L — ABNORMAL HIGH (ref 15–41)
Albumin: 3.5 g/dL (ref 3.5–5.0)
Alkaline Phosphatase: 132 U/L — ABNORMAL HIGH (ref 38–126)
Anion gap: 8 (ref 5–15)
BUN: 14 mg/dL (ref 8–23)
CO2: 24 mmol/L (ref 22–32)
Calcium: 8.8 mg/dL — ABNORMAL LOW (ref 8.9–10.3)
Chloride: 103 mmol/L (ref 98–111)
Creatinine, Ser: 0.68 mg/dL (ref 0.44–1.00)
GFR, Estimated: >60 mL/min (ref >60)
Glucose, Bld: 132 mg/dL — ABNORMAL HIGH (ref 70–99)
Potassium: 4.2 mmol/L (ref 3.5–5.1)
Sodium: 135 mmol/L (ref 135–145)
Total Bilirubin: 0.4 mg/dL (ref 0.3–1.2)
Total Protein: 6.8 g/dL (ref 6.5–8.1)

## 2019-11-24 MED ORDER — SACCHAROMYCES BOULARDII 250 MG PO CAPS: 250.0000 mg | ORAL_CAPSULE | Freq: Two times a day (BID) | ORAL | 0 refills | Status: AC

## 2019-11-24 MED ORDER — OXYCODONE HCL 5 MG PO TABS
5.0000 mg | ORAL_TABLET | ORAL | 0 refills | Status: DC | PRN
Start: 2019-11-24 — End: 2019-11-29

## 2019-11-24 MED ORDER — CIPROFLOXACIN HCL 500 MG PO TABS: 500.0000 mg | ORAL_TABLET | Freq: Two times a day (BID) | ORAL | Status: DC

## 2019-11-24 MED ORDER — CIPROFLOXACIN HCL 500 MG PO TABS: 500.0000 mg | ORAL_TABLET | Freq: Two times a day (BID) | ORAL | 0 refills | Status: AC

## 2019-11-24 MED ORDER — METRONIDAZOLE 500 MG PO TABS: 500.0000 mg | ORAL_TABLET | Freq: Three times a day (TID) | ORAL | 0 refills | Status: AC

## 2019-11-24 MED ORDER — ROSUVASTATIN CALCIUM 5 MG PO TABS
5.0000 mg | ORAL_TABLET | Freq: Every day | ORAL | Status: DC
Start: 2019-12-08 — End: 2020-07-02

## 2019-11-24 NOTE — Progress Notes (Incomplete)
PROGRESS NOTE    Lauren [name]  OZH:[id number] DOB: Nov 03, 1955 DOA: 11/21/2019 PCP: [name], MD    Chief Complaint  Patient presents with  . Animal Bite  . Arm Swelling    Brief Narrative:  Patient is a pleasant 64 year old female history of depression/anxiety, hyperlipidemia presented left wrist swelling.  Patient had a cat bite 2 days prior to admission was seen at urgent care with lacerations which were sutured.  Patient given doxycycline, clindamycin and tramadol to these medications at home tried ice and elevation however due to increased swelling and persistent pain presented back to the ED.  Plain films done negative for any acute process.  Patient initially started on IV clindamycin.  Patient admitted and antibiotics changed to IV ciprofloxacin and Flagyl.  Patient noted to have a transaminitis.  Acute hepatitis panel pending.  Abdominal ultrasound concerning for choledocholithiasis, cholelithiasis.   Assessment & Plan:   Principal Problem:   Cellulitis Active Problems:   Choledocholithiasis with obstruction   Depression   Hiatal hernia with gastroesophageal reflux   Hyperlipidemia   1 left hand cellulitis secondary to cat bite Patient presented with left hand/left wrist swelling, erythema, tenderness to palpation recent cat bite 2 days prior to admission.  Patient was on oral doxycycline and clindamycin in the outpatient setting with no significant improvement and subsequently failed outpatient therapy.  Due to patient's history of penicillin allergy placed on IV ciprofloxacin and oral Flagyl after admitting physicians discussion with pharmacy.  Clinical improvement.  Afebrile.  Patient underwent penicillin challenge and stated she immediately developed significant nausea with some emesis.  Continue current antibiotics.  Continue pain control, ice.  Hopefully could transition to oral ciprofloxacin in the next 24 hours if continued improvement.   2.   Choledocholithiasis with obstruction/transaminitis Patient noted to have a significant transaminitis on admission.  Patient with no prior history of liver issues.  Patient with normal LFTs 04/11/2019.  Patient with no significant alcohol abuse.  Acute hepatitis panel negative.  Abdominal ultrasound  concerning for cholelithiasis without evidence of cholecystitis, markedly dilated common bile duct with distal common bile duct stone causing obstruction.  Patient currently asymptomatic.  LFTs trending down.  GI consulted, patient seen in consultation by Dr. [name] and patient for ERCP with stone extraction today.  Discontinue Lovenox.  Will eventually need cholecystectomy.  GI following and appreciate their input and recommendations.   3.  Hyperlipidemia Statin on hold secondary to transaminitis.  4.  Depression/anxiety Effexor.  5.  Hiatal hernia with GERD Continue PPI.  6.  History of esophageal cancer status post esophagectomy with esophagogastric anastomosis. Patient denies any dysphagia or odynophagia.  Currently stable.  Continue PPI.  GI following.   DVT prophylaxis: Lovenox Code Status: Full Family Communication: Updated patient.  No family at bedside. Disposition:   Status is: Inpatient    Dispo: The patient is from: Home              Anticipated d/c is to: Home              Anticipated d/c date is: 2 to 3 days.              Patient currently on IV antibiotics for left hand cellulitis, transaminitis with abdominal ultrasound concerning for choledocholithiasis with obstruction.  Not stable for discharge.       Consultants:   Gastroenterology: Dr. [name] 11/22/2019  Procedures:   Plain films of the left wrist 11/21/2019  Abdominal ultrasound 11/22/2019--- cholelithiasis without  evidence of cholecystitis.  Markedly dilated common bile duct with distal common bile duct stone causing obstruction.  ERCP pending 11/23/2019  Antimicrobials:   Oral Flagyl 11/21/2019  IV  ciprofloxacin 11/21/2019   Subjective: Sleeping but arousable.  Denies any chest pain or shortness of breath.  No abdominal pain.  No nausea or vomiting.  Left hand pain/swelling improving.  Patient however states pain medication helps with the left hand pain.  Patient stated developed significant nausea right after penicillin challenge yesterday.    Objective: Vitals:   11/23/19 1800 11/23/19 2058 11/24/19 0501 11/24/19 1008  BP: (!) 141/81 132/73 (!) 108/59 110/62  Pulse: 84 64 74 68  Resp: 18  18 18   Temp: 98.5 F (36.9 C) 98.1 F (36.7 C) 98.2 F (36.8 C) 98.3 F (36.8 C)  TempSrc: Oral Oral Oral Oral  SpO2: 97% 96% 97% 99%  Weight:      Height:        Intake/Output Summary (Last 24 hours) at 11/24/2019 1332 Last data filed at 11/24/2019 0600 Gross per 24 hour  Intake 1572.14 ml  Output 0 ml  Net 1572.14 ml   Filed Weights   11/21/19 1046 11/23/19 1143  Weight: 56.7 kg 54.4 kg    Examination:  General exam: NAD Respiratory system: CTAB.  No wheezes, no crackles, no rhonchi.  Normal respiratory effort.   Cardiovascular system: Regular rate rhythm no murmurs rubs or gallops.  No JVD.  No lower extremity edema.  Gastrointestinal system: Abdomen is soft, nontender, nondistended, positive bowel sounds.  No rebound.  No guarding.  Central nervous system: Alert and oriented. No focal neurological deficits. Extremities: Left hand with less erythema, less swelling, less tender to palpation.  Skin: Decreasing erythema and edema in the left hand.  Bite site noted.  Psychiatry: Judgement and insight appear normal. Mood & affect appropriate.     Data Reviewed: I have personally reviewed following labs and imaging studies  CBC: Recent Labs  Lab 11/21/19 1135 11/22/19 0719 11/23/19 0455  WBC 7.5 5.8 6.7  NEUTROABS 5.6  --  4.3  HGB 13.5 13.7 13.0  HCT 40.7 41.9 40.8  MCV 97.4 96.8 99.0  PLT 296 288 007    Basic Metabolic Panel: Recent Labs  Lab 11/21/19 1135  11/22/19 0552 11/23/19 0455 11/24/19 0452  NA 136 136 132* 135  K 4.2 3.5 3.7 4.2  CL 100 102 100 103  CO2 28 27 25 24   GLUCOSE 112* 164* 99 132*  BUN 15 10 12 14   CREATININE 0.81 0.77 0.84 0.68  CALCIUM 9.0 9.1 8.6* 8.8*  MG  --   --  2.1  --     GFR: Estimated Creatinine Clearance: 59.4 mL/min (by C-G formula based on SCr of 0.68 mg/dL).  Liver Function Tests: Recent Labs  Lab 11/21/19 1135 11/22/19 0552 11/23/19 0455 11/24/19 0452  AST 373* 207* 103* 57*  ALT 298* 260* 191* 143*  ALKPHOS 158* 146* 143* 132*  BILITOT 0.7 0.8 0.5 0.4  PROT 7.4 7.2 7.3 6.8  ALBUMIN 4.1 4.0 3.7 3.5    CBG: No results for input(s): GLUCAP in the last 168 hours.   Recent Results (from the past 240 hour(s))  Respiratory Panel by RT PCR (Flu A&B, Covid) - Nasopharyngeal Swab     Status: None   Collection Time: 11/21/19  2:32 PM   Specimen: Nasopharyngeal Swab  Result Value Ref Range Status   SARS Coronavirus 2 by RT PCR NEGATIVE NEGATIVE Final  Comment: (NOTE) SARS-CoV-2 target nucleic acids are NOT DETECTED.  The SARS-CoV-2 RNA is generally detectable in upper respiratoy specimens during the acute phase of infection. The lowest concentration of SARS-CoV-2 viral copies this assay can detect is 131 copies/mL. A negative result does not preclude SARS-Cov-2 infection and should not be used as the sole basis for treatment or other patient management decisions. A negative result may occur with  improper specimen collection/handling, submission of specimen other than nasopharyngeal swab, presence of viral mutation(s) within the areas targeted by this assay, and inadequate number of viral copies (<131 copies/mL). A negative result must be combined with clinical observations, patient history, and epidemiological information. The expected result is Negative.  Fact Sheet for Patients:  PinkCheek.be  Fact Sheet for Healthcare Providers:   GravelBags.it  This test is no t yet approved or cleared by the [place] FDA and  has been authorized for detection and/or diagnosis of SARS-CoV-2 by FDA under an Emergency Use Authorization (EUA). This EUA will remain  in effect (meaning this test can be used) for the duration of the COVID-19 declaration under Section 564(b)(1) of the Act, 21 U.S.C. section 360bbb-3(b)(1), unless the authorization is terminated or revoked sooner.     Influenza A by PCR NEGATIVE NEGATIVE Final   Influenza B by PCR NEGATIVE NEGATIVE Final    Comment: (NOTE) The Xpert Xpress SARS-CoV-2/FLU/RSV assay is intended as an aid in  the diagnosis of influenza from Nasopharyngeal swab specimens and  should not be used as a sole basis for treatment. Nasal washings and  aspirates are unacceptable for Xpert Xpress SARS-CoV-2/FLU/RSV  testing.  Fact Sheet for Patients: PinkCheek.be  Fact Sheet for Healthcare Providers: GravelBags.it  This test is not yet approved or cleared by the [place] FDA and  has been authorized for detection and/or diagnosis of SARS-CoV-2 by  FDA under an Emergency Use Authorization (EUA). This EUA will remain  in effect (meaning this test can be used) for the duration of the  Covid-19 declaration under Section 564(b)(1) of the Act, 21  U.S.C. section 360bbb-3(b)(1), unless the authorization is  terminated or revoked. Performed at [facility], [place] [address] [facility], [place] [address]   Surgical pcr screen     Status: None   Collection Time: 11/23/19  6:19 AM   Specimen: Nasal Mucosa; Nasal Swab  Result Value Ref Range Status   MRSA, PCR NEGATIVE NEGATIVE Final   Staphylococcus aureus NEGATIVE NEGATIVE Final    Comment: (NOTE) The Xpert SA Assay (FDA approved for NASAL specimens in patients 13 years of age and older), is one component of a comprehensive  surveillance program. It is not intended to diagnose infection nor to guide or monitor treatment. Performed at [facility], [place] [address] [place], [place] [address]          Radiology Studies: DG ERCP BILIARY & PANCREATIC DUCTS  Result Date: 11/23/2019 CLINICAL DATA:  64 year old female with cholelithiasis and possible choledocholithiasis. EXAM: ERCP TECHNIQUE: Multiple spot images obtained with the fluoroscopic device and submitted for interpretation post-procedure. FLUOROSCOPY TIME:  Fluoroscopy Time:  4 minutes 10 seconds COMPARISON:  None. FINDINGS: A total of 12 intraoperative saved images are submitted for review. The images demonstrate a flexible duodenal scope in the descending duodenum followed by wire cannulation of the common bile duct and cholangiogram. The cholangiogram demonstrates rounded filling defects in the distal common bile duct and fairly marked dilation of the common bile duct. Subsequent images demonstrate balloon sweeping  of the common duct followed by placement of a plastic biliary stent. IMPRESSION: 1. Choledocholithiasis. 2. Balloon sweeping of the common duct and placement of a plastic biliary stent. These images were submitted for radiologic interpretation only. Please see the procedural report for the amount of contrast and the fluoroscopy time utilized. Electronically Signed   By: Jacqulynn Cadet M.D.   On: 11/23/2019 14:15        Scheduled Meds: . ciprofloxacin  500 mg Oral BID  . metroNIDAZOLE  500 mg Oral Q8H  . pantoprazole  40 mg Oral Q0600  . saccharomyces boulardii  250 mg Oral BID  . venlafaxine XR  150 mg Oral Q breakfast   Continuous Infusions:    LOS: 3 days    Time spent: 35 minutes    Irine Seal, MD [facility]   To contact the attending provider between 7A-7P or the covering provider during after hours 7P-7A, please log into the web site [email or web address] and access using universal [place]  password for that web site. If you do not have the password, please call the hospital operator.  11/24/2019, 1:32 PM

## 2019-11-24 NOTE — Progress Notes (Signed)
Assessment unchanged. Pt verbalized understanding of dc instructions including meds to resume and follow up care. Discharged to front entrance to meet husband, accompanied by NT.

## 2019-11-24 NOTE — Care Management Important Message (Signed)
Important Message  Patient Details IM Letter given to the Patient Name: Lauren [name] MRN: [id number] Date of Birth: 1955-04-09   Medicare Important Message Given:  Yes     Kerin Salen 11/24/2019, 11:02 AM

## 2019-11-24 NOTE — Discharge Summary (Signed)
Physician Discharge Summary  Lauren [name] EBX:[id number] DOB: 10/03/1955 DOA: 11/21/2019  PCP: [name], MD  Admit date: 11/21/2019 Discharge date: 11/24/2019  Time spent: 55 minutes  Recommendations for Outpatient Follow-up:  1. Follow-up with [name], MD in 2 weeks. On follow-up patient will need a comprehensive metabolic profile done to follow-up on electrolytes, renal function, LFTs. Hand cellulitis will need to be reassessed on follow-up. 2. Follow-up with Dr. [name], gastroenterology in 2 to 4 weeks.   Discharge Diagnoses:  Principal Problem:   Cellulitis Active Problems:   Choledocholithiasis with obstruction   Depression   Hiatal hernia with gastroesophageal reflux   Hyperlipidemia   Discharge Condition: Stable and improved.  Diet recommendation: Heart healthy.  Filed Weights   11/21/19 1046 11/23/19 1143  Weight: 56.7 kg 54.4 kg    History of present illness:  HPI per Dr. Cloyd [name] is a 64 y.o. female with medical history significant of depression/anxiety, HLD. Presenting with left wrist swelling. She reported that she had a cat bite 2 days prior to admission. She went to urgent care where some lacerations were stitched. She was given doxycycline, clindamycin and tramadol. She took those medicines and tried ice/elevation. However, the swelling had increased and the pain persisted. She did try some oxycodone that she had, and that helped a little. She became concerned and came to the ED today for further eval.    ED Course: XR was negative for acute process. She was started on IV cleocin. TRH was called for admission.   Hospital Course:  1 left hand cellulitis secondary to cat bite Patient presented with left hand/left wrist swelling, erythema, tenderness to palpation recent cat bite 2 days prior to admission.  Patient was on oral doxycycline and clindamycin in the outpatient setting with no significant improvement and subsequently failed  outpatient therapy.  Due to patient's history of penicillin allergy placed on IV ciprofloxacin and oral Flagyl after admitting physicians discussion with pharmacy.   Patient improved clinically during the hospitalization. Patient remained afebrile. Patient underwent penicillin challenge and stated she immediately developed significant nausea with some emesis. Patient subsequently transition to oral antibiotics will be discharged home on five more days of oral Flagyl and oral ciprofloxacin. Outpatient follow-up with PCP.   2.  Choledocholithiasis with obstruction/transaminitis Patient noted to have a significant transaminitis on admission.  Patient with no prior history of liver issues.  Patient with normal LFTs 04/11/2019.  Patient with no significant alcohol abuse.  Acute hepatitis panel negative.  Abdominal ultrasound  concerning for cholelithiasis without evidence of cholecystitis, markedly dilated common bile duct with distal common bile duct stone causing obstruction.  Patient remained asymptomatic during the hospitalization. GI consulted, patient seen in consultation by Dr. [name] and patient underwent ERCP with biliary sphincterectomy with plastic stent placement in the common bile duct. LFTs trended down. Statin held and to be resumed 2 weeks post discharge. Patient remained asymptomatic during the hospitalization. Outpatient follow-up with GI in 2 to 4 weeks. Patient was discharged in stable and improved condition.  3.  Hyperlipidemia Statin was held during the hospitalization due to transaminitis and will be resumed 2 weeks post discharge. Outpatient follow-up.  4.  Depression/anxiety Patient maintained on Effexor during the hospitalization, outpatient follow-up..  5.  Hiatal hernia with GERD Patient maintained on a PPI.   6.  History of esophageal cancer status post esophagectomy with esophagogastric anastomosis. Patient denied any dysphagia or odynophagia.   Patient maintained on a PPI.  Tolerated diet during the hospitalization. Outpatient follow-up.    Procedures:  Plain films of the left wrist 11/21/2019  Abdominal ultrasound 11/22/2019--- cholelithiasis without evidence of cholecystitis.  Markedly dilated common bile duct with distal common bile duct stone causing obstruction.  ERCP 11/23/2019--Dr. [name] 11/23/2019  Consultations:  Gastroenterology: Dr. [name] 11/22/2019  Discharge Exam: Vitals:   11/24/19 1008 11/24/19 1410  BP: 110/62 136/89  Pulse: 68 82  Resp: 18 18  Temp: 98.3 F (36.8 C) 98.5 F (36.9 C)  SpO2: 99% 97%    General: NAD Cardiovascular: RRR Respiratory: CTAB  Discharge Instructions   Discharge Instructions    Diet - low sodium heart healthy   Complete by: As directed    Increase activity slowly   Complete by: As directed    No wound care   Complete by: As directed      Allergies as of 11/24/2019      Reactions   Penicillins Hives, Nausea And Vomiting   PCN - Hives when she was 14 Amoxicillin - vomiting and chills (11/22/19)        Medication List    STOP taking these medications   clindamycin 300 MG capsule Commonly known as: CLEOCIN   doxycycline 100 MG tablet Commonly known as: ADOXA   traMADol 50 MG tablet Commonly known as: ULTRAM     TAKE these medications   ciprofloxacin 500 MG tablet Commonly known as: CIPRO Take 1 tablet (500 mg total) by mouth 2 (two) times daily for 5 days.   metroNIDAZOLE 500 MG tablet Commonly known as: FLAGYL Take 1 tablet (500 mg total) by mouth every 8 (eight) hours for 5 days.   omeprazole 20 MG capsule Commonly known as: PRILOSEC Take 1 capsule (20 mg total) by mouth daily.   oxyCODONE 5 MG immediate release tablet Commonly known as: Oxy IR/ROXICODONE Take 1-2 tablets (5-10 mg total) by mouth every 4 (four) hours as needed for moderate pain.   rosuvastatin 5 MG tablet Commonly known as: CRESTOR Take 1 tablet (5 mg total) by mouth daily. Start taking on: December 08, 2019 What changed: These instructions start on December 08, 2019. If you are unsure what to do until then, ask your doctor or other care provider.   saccharomyces boulardii 250 MG capsule Commonly known as: FLORASTOR Take 1 capsule (250 mg total) by mouth 2 (two) times daily for 7 days.   venlafaxine XR 150 MG 24 hr capsule Commonly known as: EFFEXOR-XR Take 1 capsule (150 mg total) by mouth daily with breakfast.      Allergies  Allergen Reactions  . Penicillins Hives and Nausea And Vomiting    PCN - Hives when she was 14 Amoxicillin - vomiting and chills (11/22/19)      Follow-up Information    [name], MD. Schedule an appointment as soon as possible for a visit in 2 week(s).   Specialty: Family Medicine Contact information: [address] [place] [address] [phone]        Carol Ada, MD. Schedule an appointment as soon as possible for a visit in 2 week(s).   Specialty: Gastroenterology Why: f/u in 2-4 weeks. Contact information: [place], [address] [place] [address] [phone]                The results of significant diagnostics from this hospitalization (including imaging, microbiology, ancillary and laboratory) are listed below for reference.    Significant Diagnostic Studies: DG Wrist Complete Left  Result Date: 11/21/2019 CLINICAL DATA:  Swelling post cat bites. EXAM: LEFT WRIST - COMPLETE 3+ VIEW COMPARISON:  None. FINDINGS: No fracture or dislocation. Normal mineralization. Mild degenerative changes in the first [facility], radiocarpal articulation and distal ulna. Carpal rows intact. There is diffuse soft tissue swelling. No radiodense foreign body or subcutaneous gas. IMPRESSION: Soft tissue swelling without fracture, foreign body, or other acute bone abnormality. Electronically Signed   By: Lucrezia Europe M.D.   On: 11/21/2019 13:24   US Abdomen Complete  Result Date: 11/22/2019 CLINICAL DATA:  Elevated LFT  EXAM: ABDOMEN ULTRASOUND COMPLETE COMPARISON:  None. FINDINGS: Gallbladder: Cholelithiasis. Largest gallstone 9 mm. No gallbladder wall thickening. Negative for sonographic Murphy sign. Common bile duct: Diameter: Markedly dilated 19 mm. 5.7 mm calculus distal common bile duct. Intrahepatic bile duct dilatation also noted. Liver: No focal lesion identified. Within normal limits in parenchymal echogenicity. Portal vein is patent on color Doppler imaging with normal direction of blood flow towards the liver. IVC: No abnormality visualized. Pancreas: Visualized portion unremarkable. Spleen: Size and appearance within normal limits. Right Kidney: Length: 10.0 mm. Echogenicity within normal limits. No mass or hydronephrosis visualized. Left Kidney: Length: 10.1 mm. Echogenicity within normal limits. No mass or hydronephrosis visualized. Abdominal aorta: No aneurysm visualized. Other findings: None. IMPRESSION: Cholelithiasis without evidence of cholecystitis. Markedly dilated common bile duct with distal common bile duct stone causing obstruction. Electronically Signed   By: Franchot Gallo M.D.   On: 11/22/2019 08:01   DG ERCP BILIARY & PANCREATIC DUCTS  Result Date: 11/23/2019 CLINICAL DATA:  64 year old female with cholelithiasis and possible choledocholithiasis. EXAM: ERCP TECHNIQUE: Multiple spot images obtained with the fluoroscopic device and submitted for interpretation post-procedure. FLUOROSCOPY TIME:  Fluoroscopy Time:  4 minutes 10 seconds COMPARISON:  None. FINDINGS: A total of 12 intraoperative saved images are submitted for review. The images demonstrate a flexible duodenal scope in the descending duodenum followed by wire cannulation of the common bile duct and cholangiogram. The cholangiogram demonstrates rounded filling defects in the distal common bile duct and fairly marked dilation of the common bile duct. Subsequent images demonstrate balloon sweeping of the common duct followed by placement of  a plastic biliary stent. IMPRESSION: 1. Choledocholithiasis. 2. Balloon sweeping of the common duct and placement of a plastic biliary stent. These images were submitted for radiologic interpretation only. Please see the procedural report for the amount of contrast and the fluoroscopy time utilized. Electronically Signed   By: Jacqulynn Cadet M.D.   On: 11/23/2019 14:15    Microbiology: Recent Results (from the past 240 hour(s))  Respiratory Panel by RT PCR (Flu A&B, Covid) - Nasopharyngeal Swab     Status: None   Collection Time: 11/21/19  2:32 PM   Specimen: Nasopharyngeal Swab  Result Value Ref Range Status   SARS Coronavirus 2 by RT PCR NEGATIVE NEGATIVE Final    Comment: (NOTE) SARS-CoV-2 target nucleic acids are NOT DETECTED.  The SARS-CoV-2 RNA is generally detectable in upper respiratoy specimens during the acute phase of infection. The lowest concentration of SARS-CoV-2 viral copies this assay can detect is 131 copies/mL. A negative result does not preclude SARS-Cov-2 infection and should not be used as the sole basis for treatment or other patient management decisions. A negative result may occur with  improper specimen collection/handling, submission of specimen other than nasopharyngeal swab, presence of viral mutation(s) within the areas targeted by this assay, and inadequate number of viral copies (<131 copies/mL). A negative result must be combined with  clinical observations, patient history, and epidemiological information. The expected result is Negative.  Fact Sheet for Patients:  PinkCheek.be  Fact Sheet for Healthcare Providers:  GravelBags.it  This test is no t yet approved or cleared by the [place] FDA and  has been authorized for detection and/or diagnosis of SARS-CoV-2 by FDA under an Emergency Use Authorization (EUA). This EUA will remain  in effect (meaning this test can be used) for the  duration of the COVID-19 declaration under Section 564(b)(1) of the Act, 21 U.S.C. section 360bbb-3(b)(1), unless the authorization is terminated or revoked sooner.     Influenza A by PCR NEGATIVE NEGATIVE Final   Influenza B by PCR NEGATIVE NEGATIVE Final    Comment: (NOTE) The Xpert Xpress SARS-CoV-2/FLU/RSV assay is intended as an aid in  the diagnosis of influenza from Nasopharyngeal swab specimens and  should not be used as a sole basis for treatment. Nasal washings and  aspirates are unacceptable for Xpert Xpress SARS-CoV-2/FLU/RSV  testing.  Fact Sheet for Patients: PinkCheek.be  Fact Sheet for Healthcare Providers: GravelBags.it  This test is not yet approved or cleared by the [place] FDA and  has been authorized for detection and/or diagnosis of SARS-CoV-2 by  FDA under an Emergency Use Authorization (EUA). This EUA will remain  in effect (meaning this test can be used) for the duration of the  Covid-19 declaration under Section 564(b)(1) of the Act, 21  U.S.C. section 360bbb-3(b)(1), unless the authorization is  terminated or revoked. Performed at [facility], [place] [address], [place], [place] [address]   Surgical pcr screen     Status: None   Collection Time: 11/23/19  6:19 AM   Specimen: Nasal Mucosa; Nasal Swab  Result Value Ref Range Status   MRSA, PCR NEGATIVE NEGATIVE Final   Staphylococcus aureus NEGATIVE NEGATIVE Final    Comment: (NOTE) The Xpert SA Assay (FDA approved for NASAL specimens in patients 46 years of age and older), is one component of a comprehensive surveillance program. It is not intended to diagnose infection nor to guide or monitor treatment. Performed at [facility], [place] [address], [place], [place] [address]      Labs: Basic Metabolic Panel: Recent Labs  Lab 11/21/19 1135 11/22/19 0552 11/23/19 0455 11/24/19 0452  NA 136 136  132* 135  K 4.2 3.5 3.7 4.2  CL 100 102 100 103  CO2 28 27 25 24   GLUCOSE 112* 164* 99 132*  BUN 15 10 12 14   CREATININE 0.81 0.77 0.84 0.68  CALCIUM 9.0 9.1 8.6* 8.8*  MG  --   --  2.1  --    Liver Function Tests: Recent Labs  Lab 11/21/19 1135 11/22/19 0552 11/23/19 0455 11/24/19 0452  AST 373* 207* 103* 57*  ALT 298* 260* 191* 143*  ALKPHOS 158* 146* 143* 132*  BILITOT 0.7 0.8 0.5 0.4  PROT 7.4 7.2 7.3 6.8  ALBUMIN 4.1 4.0 3.7 3.5   No results for input(s): LIPASE, AMYLASE in the last 168 hours. No results for input(s): AMMONIA in the last 168 hours. CBC: Recent Labs  Lab 11/21/19 1135 11/22/19 0719 11/23/19 0455  WBC 7.5 5.8 6.7  NEUTROABS 5.6  --  4.3  HGB 13.5 13.7 13.0  HCT 40.7 41.9 40.8  MCV 97.4 96.8 99.0  PLT 296 288 321   Cardiac Enzymes: No results for input(s): CKTOTAL, CKMB, CKMBINDEX, TROPONINI in the last 168 hours. BNP: BNP (last 3 results) No results for input(s): BNP  in the last 8760 hours.  ProBNP (last 3 results) No results for input(s): PROBNP in the last 8760 hours.  CBG: No results for input(s): GLUCAP in the last 168 hours.     Signed:  Irine Seal MD.  [facility] 11/24/2019, 3:22 PM

## 2019-11-27 ENCOUNTER — Encounter (HOSPITAL_COMMUNITY): Payer: Self-pay | Admitting: Gastroenterology

## 2019-11-27 NOTE — Anesthesia Postprocedure Evaluation (Signed)
Anesthesia Post Note  Patient: Lauren [name]  Procedure(s) Performed: ENDOSCOPIC RETROGRADE CHOLANGIOPANCREATOGRAPHY (ERCP) (N/A ) SPHINCTEROTOMY BILIARY STENT PLACEMENT (N/A ) REMOVAL OF STONES     Patient location during evaluation: PACU Anesthesia Type: General Level of consciousness: awake Pain management: pain level controlled Vital Signs Assessment: post-procedure vital signs reviewed and stable Respiratory status: spontaneous breathing Cardiovascular status: stable Postop Assessment: no apparent nausea or vomiting Anesthetic complications: no   No complications documented.  Last Vitals:  Vitals:   11/24/19 1008 11/24/19 1410  BP: 110/62 136/89  Pulse: 68 82  Resp: 18 18  Temp: 36.8 C 36.9 C  SpO2: 99% 97%    Last Pain:  Vitals:   11/24/19 1410  TempSrc: Oral  PainSc:    Pain Goal: Patients Stated Pain Goal: 2 (11/23/19 2038)                 Huston Foley

## 2019-11-28 ENCOUNTER — Telehealth: Payer: Self-pay

## 2019-11-28 NOTE — Telephone Encounter (Signed)
Transition Care Management Follow-up Telephone Call  Date of discharge and from where: 11/24/19-  How have you been since you were released from the hospital? Good but still having some pain in left wrist & arm  Any questions or concerns? No  Items Reviewed:  Did the pt receive and understand the discharge instructions provided? Yes   Medications obtained and verified? Yes   Other? Yes   Any new allergies since your discharge? No   Dietary orders reviewed? Yes  Do you have support at home? Yes   Home Care and Equipment/Supplies: Were home health services ordered? no If so, what is the name of the agency? n/a  Has the agency set up a time to come to the patient's home? not applicable Were any new equipment or medical supplies ordered?  No What is the name of the medical supply agency? n/a Were you able to get the supplies/equipment? not applicable Do you have any questions related to the use of the equipment or supplies? No  Functional Questionnaire: (I = Independent and D = Dependent) ADLs: I  Bathing/Dressing- I  Meal Prep- I  Eating- I  Maintaining continence- I  Transferring/Ambulation- I  Managing Meds- I  Follow up appointments reviewed:   PCP Hospital f/u appt confirmed? Yes  Scheduled to see Mackie Pai on 11/29/2019 @ 2:40pm.  [facility] f/u appt confirmed? No  Patient plans to call Dr. [name] to schedule  Are transportation arrangements needed? No   If their condition worsens, is the pt aware to call PCP or go to the Emergency Dept.? Yes  Was the patient provided with contact information for the PCP's office or ED? Yes  Was to pt encouraged to call back with questions or concerns? Yes

## 2019-11-29 ENCOUNTER — Telehealth: Payer: Self-pay | Admitting: Medical

## 2019-11-29 ENCOUNTER — Ambulatory Visit (INDEPENDENT_AMBULATORY_CARE_PROVIDER_SITE_OTHER): Payer: Medicare HMO | Admitting: Medical

## 2019-11-29 ENCOUNTER — Other Ambulatory Visit: Payer: Self-pay | Admitting: Medical

## 2019-11-29 ENCOUNTER — Other Ambulatory Visit: Payer: Self-pay

## 2019-11-29 VITALS — BP 146/76 | HR 82 | Resp 18 | Ht 64.0 in | Wt 126.9 lb

## 2019-11-29 DIAGNOSIS — M79642 Pain in left hand: Secondary | ICD-10-CM | POA: Diagnosis not present

## 2019-11-29 DIAGNOSIS — K8020 Calculus of gallbladder without cholecystitis without obstruction: Secondary | ICD-10-CM

## 2019-11-29 DIAGNOSIS — L089 Local infection of the skin and subcutaneous tissue, unspecified: Secondary | ICD-10-CM

## 2019-11-29 DIAGNOSIS — R748 Abnormal levels of other serum enzymes: Secondary | ICD-10-CM | POA: Diagnosis not present

## 2019-11-29 DIAGNOSIS — R69 Illness, unspecified: Secondary | ICD-10-CM | POA: Diagnosis not present

## 2019-11-29 DIAGNOSIS — M25522 Pain in left elbow: Secondary | ICD-10-CM

## 2019-11-29 DIAGNOSIS — F339 Major depressive disorder, recurrent, unspecified: Secondary | ICD-10-CM

## 2019-11-29 MED ORDER — OXYCODONE HCL 5 MG PO TABS: ORAL_TABLET | ORAL | 0 refills | Status: DC

## 2019-11-29 MED ORDER — GABAPENTIN 100 MG PO CAPS: 100.0000 mg | ORAL_CAPSULE | Freq: Three times a day (TID) | ORAL | 0 refills | Status: DC

## 2019-11-29 MED FILL — oxyCODONE HCL 5 MG TABS: 5 | 4 days supply | Qty: 20 | Fill #0

## 2019-11-29 MED FILL — GABAPENTIN 100 MG CAPSULE: 100 | 30 days supply | Qty: 90 | Fill #0

## 2019-11-29 NOTE — Patient Instructions (Addendum)
Recent hospitalization after cat bite.  By exam today looks like the prior infection is cleared.  Watch prior puncture/Bite wound areas.  If you notice any redness swelling or worse discomfort let us know.  You do have left hand pain and elbow region pain.  It sounds like you might have some tendinitis and neuropathic type pain.  Refilling limited number of oxycodone today and providing gabapentin to use as directed.  First time gabapentin use will be 1 tablet at night separate from oxycodone to make sure it does not over sedate you and to see if it has impact on level of pain.  For depression continue Effexor.  For call stones and recent procedure done by Dr.Hung placed referral placed so he can see him as planned.  I recommend that she call his office in a couple of days to get scheduled.  Will defer repeat metabolic panel to his office.  Later did see that she should have gotten repeat labs on follow up per hospital dc. So will ask MA staff to call pt and have her repeat that tomorrow at [facility] to hold statin pending repeat liver enzyme test.  Follow-up in 2 weeks or as needed.

## 2019-11-29 NOTE — Telephone Encounter (Signed)
Refer to [facility]. If they don't have sports medicine then see if orthopedist will see.

## 2019-11-29 NOTE — Telephone Encounter (Signed)
On referral can you try [facility]. If they don't have [facility] then can you get in with orthopedist.

## 2019-11-29 NOTE — Telephone Encounter (Signed)
Will you call pt and have her repeat cmp tomorrow wed or Thursday. She had hospital follow up and I later saw in hospital dc  she should repeat liver enzymes. So please have her get that done. I put in order for [facility] location. Pt [place] near [place] so that would be more convenient.

## 2019-11-29 NOTE — Progress Notes (Signed)
Subjective:    Patient ID: Lauren [name], female    DOB: Aug 07, 1955, 64 y.o.   MRN: [id number]  HPI  Pt in for follow up.  She got bit by cat. She got infection and then went to ED. [facility] and had sutures and given antibiotic. Next days went to ED as area became painful.     Was hospitalized given IV antibiotics. Admit 11/21/2019. Dc date 11/24/2019.  DC summary.  Recommendations for Outpatient Follow-up:  1. Follow-up with [name], MD in 2 weeks. On follow-up patient will need a comprehensive metabolic profile done to follow-up on electrolytes, renal function, LFTs. Hand cellulitis will need to be reassessed on follow-up. 2. Follow-up with Dr. [name], gastroenterology in 2 to 4 weeks.   Hospital course below.   Hospital Course:  1 left hand cellulitis secondary to cat bite Patient presented with left hand/left wrist swelling, erythema, tenderness to palpation recent cat bite 2 days prior to admission. Patient was on oral doxycycline and clindamycin in the outpatient setting with no significant improvement and subsequently failed outpatient therapy. Due to patient's history of penicillin allergy placed on IV ciprofloxacin and oral Flagyl after admitting physicians discussion with pharmacy.  Patient improved clinically during the hospitalization. Patient remained afebrile. Patient underwent penicillin challenge and stated she immediately developed significant nausea with some emesis. Patient subsequently transition to oral antibiotics will be discharged home on five more days of oral Flagyl and oral ciprofloxacin. Outpatient follow-up with PCP.  2. Choledocholithiasis with obstruction/transaminitis Patient noted to have a significant transaminitis on admission. Patient with no prior history of liver issues. Patient with normal LFTs 04/11/2019. Patient with no significant alcohol abuse. Acute hepatitis panel negative.Abdominal ultrasound concerning for  cholelithiasis without evidence of cholecystitis, markedly dilated common bile duct with distal common bile duct stone causing obstruction. Patient remained asymptomatic during the hospitalization. GI consulted, patient seen in consultation by Dr. [name] and patient underwent ERCP with biliary sphincterectomy with plastic stent placement in the common bile duct. LFTs trended down. Statin held and to be resumed 2 weeks post discharge. Patient remained asymptomatic during the hospitalization. Outpatient follow-up with GI in 2 to 4 weeks. Patient was discharged in stable and improved condition.  3. Hyperlipidemia Statin was held during the hospitalization due to transaminitis and will be resumed 2 weeks post discharge. Outpatient follow-up.  4. Depression/anxiety Patient maintained on Effexor during the hospitalization, outpatient follow-up..  5. Hiatal hernia with GERD Patient maintained on a PPI.   6. History of esophageal cancer status post esophagectomy with esophagogastric anastomosis. Patient denied any dysphagia or odynophagia.  Patient maintained on a PPI. Tolerated diet during the hospitalization. Outpatient follow-up.    Procedures:  Plain films of the left wrist 11/21/2019  Abdominal ultrasound 11/22/2019--- cholelithiasis without evidence of cholecystitis. Markedly dilated common bile duct with distal common bile duct stone causing obstruction.  ERCP 11/23/2019--Dr. [name] 11/23/2019  Consultations:  Gastroenterology: Dr. [name] 11/22/2019    Pt is running out of antibiotic today. Pt states she is having severe pain from hand up to her elbow.  Pt does not have appointment set up with Dr. [name].          Objective:   Physical Exam  General- No acute distress. Pleasant patient. Neck- Full range of motion, no jvd Lungs- Clear, even and unlabored. Heart- regular rate and rhythm. Neurologic- CNII- XII grossly intact. Left upper ext- left hand shows small  healed puncture wounds.Dorsal aspect of hand at base  of wrist mild faint tenderness. Mild swelling in area about 1 cm. No redness, now warmth and no induration. On palpation elbow area nontender.      Assessment & Plan:  Recent hospitalization after cat bite.  By exam today looks like the prior infection is cleared.  Watch prior puncture/Bite wound areas.  If you notice any redness swelling or worse discomfort let us know.  You do have left hand pain and elbow region pain.  It sounds like you might have some tendinitis and neuropathic type pain.  Refilling limited number of oxycodone today and providing gabapentin to use as directed.  First time gabapentin use will be 1 tablet at night separate from oxycodone to make sure it does not over sedate you and to see if it has impact on level of pain.  For depression continue Effexor.  For call stones and recent procedure done by Dr.Hung placed referral placed so he can see him as planned.  I recommend that she call his office in a couple of days to get scheduled.  Will defer repeat metabolic panel to his office.  Later did see that she should have gotten repeat labs on follow up per hospital dc. So will ask [facility] staff to call pt and have her repeat that tomorrow at [facility] to hold statin pending repeat liver enzyme test.  Follow-up in 2 weeks or as needed.  Mackie Pai, PA-C   Time spent with patient today was 40+  minutes which consisted of chart review, discussing diagnosis, work up, treatment, placing 2 referral, answering questions  and documentation.

## 2019-11-29 NOTE — Telephone Encounter (Signed)
Due to staffing I'm working  phones and referrals. I can only send the referral to one location. I also have other referral coord. working on my work list so they won't see any personal message you send me about referrals.

## 2019-11-30 NOTE — Telephone Encounter (Signed)
Pt called and lvm to return call 

## 2019-11-30 NOTE — Progress Notes (Signed)
Subjective:    I'm seeing this patient as a consultation for:  [employer], [employer]. Note will be routed back to referring provider/PCP.  CC: L hand and forearm pain  I, Molly Weber, LAT, ATC, am serving as scribe for Dr. [name].  HPI: Pt is a 64 y/o female presenting w/ c/o L hand, forearm and elbow pain after being treated for cat bite to her L hand.  Due to non-optimal response to oral antibiotics for the cat bite, pt was ultimately hospitalized x 3 days to receive IV ciprofloxacin and oral Flagyl.  She had a hospital f/u visit w/ her PCP on 11/29/19.  She locates her pain to L hand on dorsal aspect from where the cat scratch was, the pn now radiates up to L elbow, but sometimes it will go numb/tingly, and pn will even go up to shoulder. Small circular raised area over dorsum. Pn c/o the injury is still extremely painful. Aggravating factors: not having arm elevated/in sling  Treatments tried: Gabapentin; ice and sling  Diagnostic testing: L wrist XR- 11/21/19  Past medical history, Surgical history, Family history, Social history, Allergies, and medications have been entered into the medical record, reviewed.   Review of Systems: No new headache, visual changes, nausea, vomiting, diarrhea, constipation, dizziness, abdominal pain, skin rash, fevers, chills, night sweats, weight loss, swollen lymph nodes, body aches, joint swelling, muscle aches, chest pain, shortness of breath, mood changes, visual or auditory hallucinations.   Objective:    Vitals:   12/01/19 1107  BP: 128/76  Pulse: 76  SpO2: 98%   General: Well Developed, well nourished, and in no acute distress.  Neuro/Psych: Alert and oriented x3, extra-ocular muscles intact, able to move all 4 extremities, sensation grossly intact. Skin: Warm and dry, no rashes noted.  Respiratory: Not using accessory muscles, speaking in full sentences, trachea midline.  Cardiovascular: Pulses palpable, no extremity edema. Abdomen:  Does not appear distended. MSK: Left arm healing wounds that radial and dorsal wrist.  No erythema.  Mildly swollen dorsal wrist overlying fourth dorsal wrist compartment.  Mildly tender to palpation this region.  Normal wrist motion and strength.  Able to extend wrist and fingers without severe pain.  Left elbow normal-appearing mildly tender palpation at medial epicondyle.  . Normal elbow motion and strength.  C-spine normal-appearing nontender normal motion.  Lab and Radiology Results EXAM: LEFT WRIST - COMPLETE 3+ VIEW  COMPARISON:  None.  FINDINGS: No fracture or dislocation. Normal mineralization. Mild degenerative changes in the first [place], radiocarpal articulation and distal ulna. Carpal rows intact. There is diffuse soft tissue swelling. No radiodense foreign body or subcutaneous gas.  IMPRESSION: Soft tissue swelling without fracture, foreign body, or other acute bone abnormality.   Electronically Signed   By: Lucrezia Europe M.D.   On: 11/21/2019 13:24 I, [name], personally (independently) visualized and performed the interpretation of the images attached in this note.  Diagnostic Limited MSK Ultrasound of: left dorsal wrist 4th doral wrist compartment visualized. Extensor tendons are intact.  However hypoechoic fluid tracks within tendon sheath consistent with tenosynovitis.  No significant tenosynovitis visible tracking past 2 cm past the wrist proximally. Other structures normal-appearing Impression: Fourth dorsal wrist compartment extensor tenosynovitis   Impression and Recommendations:    Assessment and Plan: 64 y.o. female with Left wrist pain and swelling following cat bite. Initially Toni likely had infective or septic tenosynovitis. This was treated with IV Abx followed by 1 week of oral ABX. At this point  I do not think she has remaining infection but still has significant tenosynovitis.  Plan for relative rest and tx with voltaren gel and referral to  hand PT. Plan to start hand PT in 1-2 weeks.  Recheck next week.  Will also work on weaning oxycodone.   Discussed options for brace/splint. As she can move her hand and fingers well at this point I think we can avoid splint.   Discussed precautions.   .   Orders Placed This Encounter  Procedures  . [place] LIMITED JOINT SPACE STRUCTURES UP LEFT(NO LINKED CHARGES)    Standing Status:   Future    Number of Occurrences:   1    Standing Expiration Date:   05/30/2020    Order Specific Question:   Reason for Exam (SYMPTOM  OR DIAGNOSIS REQUIRED)    Answer:   L arm and hand pain    Order Specific Question:   Preferred imaging location?    Answer:   [place]  . Ambulatory referral to Physical Therapy    Referral Priority:   Routine    Referral Type:   Physical Medicine    Referral Reason:   Specialty Services Required    Requested Specialty:   Physical Therapy    Number of Visits Requested:   1   No orders of the defined types were placed in this encounter.   Discussed warning signs or symptoms. Please see discharge instructions. Patient expresses understanding.   The above documentation has been reviewed and is accurate and complete [name], M.D.

## 2019-12-01 ENCOUNTER — Other Ambulatory Visit (INDEPENDENT_AMBULATORY_CARE_PROVIDER_SITE_OTHER): Payer: Medicare HMO

## 2019-12-01 ENCOUNTER — Ambulatory Visit: Payer: Self-pay

## 2019-12-01 ENCOUNTER — Encounter: Payer: Self-pay | Admitting: Family Medicine

## 2019-12-01 ENCOUNTER — Telehealth: Payer: Self-pay

## 2019-12-01 ENCOUNTER — Other Ambulatory Visit: Payer: Self-pay

## 2019-12-01 ENCOUNTER — Ambulatory Visit: Payer: Medicare HMO | Admitting: Family Medicine

## 2019-12-01 VITALS — BP 128/76 | HR 76 | Ht 64.0 in | Wt 124.8 lb

## 2019-12-01 DIAGNOSIS — M79602 Pain in left arm: Secondary | ICD-10-CM

## 2019-12-01 DIAGNOSIS — M659 Synovitis and tenosynovitis, unspecified: Secondary | ICD-10-CM

## 2019-12-01 DIAGNOSIS — R748 Abnormal levels of other serum enzymes: Secondary | ICD-10-CM | POA: Diagnosis not present

## 2019-12-01 DIAGNOSIS — W5501XS Bitten by cat, sequela: Secondary | ICD-10-CM

## 2019-12-01 LAB — COMPREHENSIVE METABOLIC PANEL
ALT: 30 U/L (ref 0–35)
AST: 26 U/L (ref 0–37)
Albumin: 4 g/dL (ref 3.5–5.2)
Alkaline Phosphatase: 79 U/L (ref 39–117)
BUN: 12 mg/dL (ref 6–23)
CO2: 31 mEq/L (ref 19–32)
Calcium: 9.3 mg/dL (ref 8.4–10.5)
Chloride: 102 mEq/L (ref 96–112)
Creatinine, Ser: 0.82 mg/dL (ref 0.40–1.20)
GFR: 74.34 mL/min (ref >60.00)
Glucose, Bld: 110 mg/dL — ABNORMAL HIGH (ref 70–99)
Potassium: 4.3 mEq/L (ref 3.5–5.1)
Sodium: 137 mEq/L (ref 135–145)
Total Bilirubin: 0.3 mg/dL (ref 0.2–1.2)
Total Protein: 7.1 g/dL (ref 6.0–8.3)

## 2019-12-01 NOTE — Telephone Encounter (Signed)
Called pt and relayed that Voltaren gel is OTC med and, therefore, she does not need a prescription.  Pt verbalizes understanding.

## 2019-12-01 NOTE — Telephone Encounter (Signed)
Patient calling stating that she was supposed to be getting a Rx sent in for a topical cream for a cat bite. Patient would like a call when it has ben sent in.

## 2019-12-01 NOTE — Patient Instructions (Addendum)
Thank you for coming in today.  Please use voltaren gel up to 4x daily for pain as needed.   Try to wean the oxycodone.   Ok to increase the gabapentin.   Recheck next week.   Plan for hand PT.   Tenosynovitis  Tenosynovitis is inflammation of a tendon and of the sleeve of tissue that covers the tendon (tendon sheath). A tendon is a cord of tissue that connects muscle to bone. Normally, a tendon slides smoothly inside its tendon sheath. Tenosynovitis limits movement of the tendon and surrounding tissues, which may cause pain and stiffness. Tenosynovitis can affect any tendon and tendon sheath. Commonly affected areas include tendons in the:  Wrist.  Arm.  Hand.  Hip.  Leg.  Foot.  Shoulder. What are the causes? The main cause of this condition is wear and tear over time that results in slight tears in the tendon. Other possible causes include:  An injury to the tendon or tendon sheath.  A disease that causes inflammation in the body.  An infection that spreads to the tendon and tendon sheath from a skin wound.  An infection in another part of the body that spreads to the tendon and tendon sheath through the blood. What increases the risk? The following factors may make you more likely to develop this condition:  Having rheumatoid arthritis, gout, or diabetes.  Using IV drugs.  Doing physical activities that can cause tendon overuse and stress.  Having gonorrhea. What are the signs or symptoms? Symptoms of this condition depend on the cause. Symptoms may include:  Pain with movement.  Pain when pressing on the tendon and tendon sheath.  Swelling.  Stiffness. If tenosynovitis is caused by an infection, symptoms may include:  Fever.  Redness.  Warmth. How is this diagnosed? This condition may be diagnosed based on your medical history and a physical exam. You also may have:  Blood tests.  Imaging tests, such as: ? MRI. ? Ultrasound.  A sample  of fluid removed from inside the tendon sheath to be checked in a lab. How is this treated? Treatment for this condition depends on the cause. If tenosynovitis is not caused by an infection, treatment may include:  Rest.  Keeping the tendon in place (immobilization) in a splint, brace, or sling.  Taking NSAIDs to reduce pain and swelling.  A shot (injection) of medicine to help reduce pain and swelling (steroid).  Icing or applying heat to the affected area.  Physical therapy.  Surgery to release the tendon in the sheath or to repair damage to the tendon or tendon sheath. Surgery may be done if other treatments do not help relieve symptoms. If tenosynovitis is caused by infection, treatment may include antibiotic medicine given through an IV. In some cases, surgery may be needed to drain fluid from the tendon sheath or to remove the tendon sheath. Follow these instructions at home: If you have a splint, brace, or sling:   Wear the splint, brace, or sling as told by your health care provider. Remove it only as told by your health care provider.  Loosen the splint, brace, or sling if your fingers or toes tingle, become numb, or turn cold and blue.  Keep the splint, brace, or sling clean.  If the splint, brace, or sling is not waterproof: ? Do not let it get wet. ? Cover it with a watertight covering when you take a bath or shower. Managing pain, stiffness, and swelling   If directed, put  ice on the affected area. ? Put ice in a plastic bag. ? Place a towel between your skin and the bag. ? Leave the ice on for 20 minutes, 2-3 times a day.  Move the fingers or toes of the affected limb often, if this applies. This can help to reduce stiffness and swelling.  If directed, raise (elevate) the affected area above the level of your heart while you are sitting or lying down.  If directed, apply heat to the affected area before you exercise. Use the heat source that your health care  provider recommends, such as a moist heat pack or a heating pad. ? Place a towel between your skin and the heat source. ? Leave the heat on for 20-30 minutes. ? Remove the heat if your skin turns bright red. This is especially important if you are unable to feel pain, heat, or cold. You may have a greater risk of getting burned. Medicines  Take over-the-counter and prescription medicines only as told by your health care provider.  Ask your health care provider if the medicine prescribed to you: ? Requires you to avoid driving or using heavy machinery. ? Can cause constipation. You may need to take actions to prevent or treat constipation, such as:  Drink enough fluid to keep your urine pale yellow.  Take over-the-counter or prescription medicines.  Eat foods that are high in fiber, such as beans, whole grains, and fresh fruits and vegetables.  Limit foods that are high in fat and processed sugars, such as fried or sweet foods. Activity  Return to your normal activities as told by your health care provider. Ask your health care provider what activities are safe for you.  Rest the affected area as told by your health care provider.  Avoid using the affected area while you are having symptoms.  Do not use the injured limb to support your body weight until your health care provider says that you can.  If physical therapy was prescribed, do exercises as told by your health care provider. General instructions  Ask your health care provider when it is safe to drive if you have a splint or brace on any part of your arm or leg.  Keep all follow-up visits as told by your health care provider. This is important. Contact a health care provider if:  Your symptoms are not improving or are getting worse. Get help right away if:  Your fingers or toes become numb or turn blue.  You have a fever and more of any of the following  symptoms: ? Pain. ? Redness. ? Warmth. ? Swelling. Summary  Tenosynovitis is inflammation of a tendon and of the sleeve of tissue that covers the tendon (tendon sheath).  Treatment for this condition depends on the cause. Treatment may include rest, medicines, physical therapy, or surgery.  Contact a health care provider if your symptoms are not improving or are getting worse.  Keep all follow-up visits as told by your health care provider. This is important. This information is not intended to replace advice given to you by your health care provider. Make sure you discuss any questions you have with your health care provider. Document Revised: 08/20/2017 Document Reviewed: 08/20/2017 Elsevier Patient Education  [place].

## 2019-12-02 NOTE — Telephone Encounter (Signed)
Pt called labs reviewed , pt states she got her bloodwork done 11/18 . Seeing PCP 11/29

## 2019-12-05 DIAGNOSIS — R69 Illness, unspecified: Secondary | ICD-10-CM | POA: Diagnosis not present

## 2019-12-06 NOTE — Progress Notes (Signed)
   I, [name], LAT, ATC, am serving as scribe for Dr. [name].  Lauren [name] is a 64 y.o. female who presents to [place] at [facility] today for f/u of L dorsal hand pain that radiates proximally to her L elbow and L upper arm after suffering a cat bite and being treated w/ oral and IV antibiotics.  She was last seen by [name] on 12/01/19 and was referred to PT.  She was advised to begin weaning off of the Oxycodone and that it was ok to increase her dosage of Gabapentin.  Since then, pt reports pn is decent throughout the day, but c/o increased pn and aching around 3PM. Pn on volar aspect of hand and pn will radiate up to elbow. Pt reports using ice in the afternoons to help w/ pn. Pt notes she is now taking 900 mg gabapentin/day and is no longer taking the oxycodone or tylenol. Pt asked about the possibility of draining the fluid around tendon in her forearm that was seen on [place] at last visit.  Diagnostic imaging: L wrist XR- 11/21/19  Pertinent review of systems: No fevers or chills  Relevant historical information: Gallstone with obstruction.  History of esophageal cancer 2010   Exam:  BP 118/80 (BP Location: Right Arm, Patient Position: Sitting, Cuff Size: Normal)   Pulse 82   Ht 5\' 4"  (1.626 m)   Wt 124 lb 11.2 oz (56.6 kg)   SpO2 98%   BMI 21.40 kg/m  General: Well Developed, well nourished, and in no acute distress.   MSK: Left wrist and elbow. Left wrist dorsal wrist slight swollen.  Not particularly tender normal wrist motion. Normal strength. Left elbow normal-appearing mildly tender palpation medial epicondyle. Some pain with resisted wrist flexion. Pulses cap refill and sensation are intact distally.    Lab and Radiology Results  Diagnostic Limited MSK Ultrasound of: left wrist and elbow Left wrist dorsal.  Slight hypoechoic fluid tracking within fourth dorsal wrist compartment tendon sheath.  Normal tendon structures.  Not particular  tender palpation. Left elbow normal appearance of medial and lateral epicondyle.  Normal cubital tunnel. Impression: Mild tenosynovitis dorsal wrist     Assessment and Plan: 64 y.o. female with improving wrist and elbow pain after cat bite.  Pain is now mostly located at medial epicondyle.  She is having considerable improvement with gabapentin and a bit of time.  Plan for home exercise program and check back in a month.  If not improving would consider injection trial. Refilled gabapentin at current dose of 300 mg 3 times a day.   PDMP not reviewed this encounter. Orders Placed This Encounter  Procedures  . [place] LIMITED JOINT SPACE STRUCTURES UP LEFT(NO LINKED CHARGES)    Order Specific Question:   Reason for Exam (SYMPTOM  OR DIAGNOSIS REQUIRED)    Answer:   eval left wrist    Order Specific Question:   Preferred imaging location?    Answer:   [place]   Meds ordered this encounter  Medications  . gabapentin (NEURONTIN) 300 MG capsule    Sig: Take 1 capsule (300 mg total) by mouth 3 (three) times daily as needed.    Dispense:  90 capsule    Refill:  3     Discussed warning signs or symptoms. Please see discharge instructions. Patient expresses understanding.   The above documentation has been reviewed and is accurate and complete [name], M.D.

## 2019-12-07 ENCOUNTER — Ambulatory Visit (INDEPENDENT_AMBULATORY_CARE_PROVIDER_SITE_OTHER): Payer: Medicare HMO | Admitting: Family Medicine

## 2019-12-07 ENCOUNTER — Ambulatory Visit: Payer: Self-pay

## 2019-12-07 ENCOUNTER — Other Ambulatory Visit: Payer: Self-pay

## 2019-12-07 VITALS — BP 118/80 | HR 82 | Ht 64.0 in | Wt 124.7 lb

## 2019-12-07 DIAGNOSIS — M79602 Pain in left arm: Secondary | ICD-10-CM | POA: Diagnosis not present

## 2019-12-07 DIAGNOSIS — W5501XS Bitten by cat, sequela: Secondary | ICD-10-CM

## 2019-12-07 DIAGNOSIS — M659 Synovitis and tenosynovitis, unspecified: Secondary | ICD-10-CM | POA: Diagnosis not present

## 2019-12-07 MED ORDER — GABAPENTIN 300 MG PO CAPS: 300.0000 mg | ORAL_CAPSULE | Freq: Three times a day (TID) | ORAL | 3 refills | Status: DC | PRN

## 2019-12-07 NOTE — Patient Instructions (Addendum)
Thank you for coming in today.  Continue gabapentin as needed.   Do the exercises. View at my-exercise-code.com using code: [facility]  Recheck in 1 month if not better. Will do injection then.   Ok to come back sooner or contact me sooner if needed.   I am out of the office Dec 10-17.

## 2019-12-12 ENCOUNTER — Ambulatory Visit: Payer: Medicare HMO | Admitting: Family Medicine

## 2019-12-12 DIAGNOSIS — Z0289 Encounter for other administrative examinations: Secondary | ICD-10-CM

## 2019-12-21 DIAGNOSIS — M25522 Pain in left elbow: Secondary | ICD-10-CM | POA: Diagnosis not present

## 2019-12-21 DIAGNOSIS — M79642 Pain in left hand: Secondary | ICD-10-CM | POA: Diagnosis not present

## 2019-12-27 ENCOUNTER — Ambulatory Visit (INDEPENDENT_AMBULATORY_CARE_PROVIDER_SITE_OTHER): Payer: Medicare HMO | Admitting: Family Medicine

## 2019-12-27 ENCOUNTER — Ambulatory Visit (INDEPENDENT_AMBULATORY_CARE_PROVIDER_SITE_OTHER): Payer: Medicare HMO

## 2019-12-27 ENCOUNTER — Other Ambulatory Visit: Payer: Self-pay

## 2019-12-27 ENCOUNTER — Encounter: Payer: Self-pay | Admitting: Family Medicine

## 2019-12-27 ENCOUNTER — Ambulatory Visit: Payer: Self-pay

## 2019-12-27 VITALS — BP 100/70 | HR 81 | Ht 64.0 in | Wt 124.0 lb

## 2019-12-27 DIAGNOSIS — G5622 Lesion of ulnar nerve, left upper limb: Secondary | ICD-10-CM

## 2019-12-27 DIAGNOSIS — M25522 Pain in left elbow: Secondary | ICD-10-CM | POA: Diagnosis not present

## 2019-12-27 DIAGNOSIS — G5620 Lesion of ulnar nerve, unspecified upper limb: Secondary | ICD-10-CM | POA: Insufficient documentation

## 2019-12-27 MED ORDER — PREDNISONE 20 MG PO TABS: 40.0000 mg | ORAL_TABLET | Freq: Every day | ORAL | 0 refills | Status: DC

## 2019-12-27 MED ORDER — GABAPENTIN 300 MG PO CAPS: 300.0000 mg | ORAL_CAPSULE | Freq: Every day | ORAL | 3 refills | Status: DC

## 2019-12-27 NOTE — Assessment & Plan Note (Addendum)
Patient is having increasing discomfort.  Patient states that the gabapentin was not helping as much.  Patient is taking 300 mg times a day.  I would like patient to restart at 300 mg at night.  Patient does have significant weakness noted in the C8 distribution at this time.  We will get x-rays of the neck.  Given some prednisone.  We will see how patient responds.  I do not see any signs of any infectious etiology.  We may need to consider the possibility of labs as well.  Patient given nebulizer and if not responding well needs to seek medical attention immediately.  Patient did have hyperglycemia when patient was in the hospital but has made improvement since.  Even before the infectious etiology did not have any trouble with blood sugars.  Patient will see [name] again in 2 weeks we did discuss with patient about the also possibility of laboratory work-up but patient CBC had never been elevated previously.  Would consider sedimentation rate.

## 2019-12-27 NOTE — Patient Instructions (Addendum)
Good to see you Xray today Gabapentin 300 mg at night Prednisone 40 for 5 days Nerve conduction test  See [name] again in 2-3 weeks

## 2019-12-27 NOTE — Progress Notes (Signed)
[facility] [address] [place] Phone: [phone] Subjective:   I Lauren [name] am serving as a Education administrator for Lauren [name].  This visit occurred during the SARS-CoV-2 public health emergency.  Safety protocols were in place, including screening questions prior to the visit, additional usage of staff PPE, and extensive cleaning of exam room while observing appropriate contact time as indicated for disinfecting solutions.   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 female with improving wrist and elbow pain after cat bite.  Pain is now mostly located at medial epicondyle.  She is having considerable improvement with gabapentin and a bit of time.  Plan for home exercise program and check back in a month.  If not improving would consider injection trial. Refilled gabapentin at current dose of 300 mg 3 times a day.   12/27/2019 Lauren [name] is a 64 y.o. female coming in with complaint of left arm pain.  Patient did have a cat bite that needed oral and IV antibiotics.  Was referred to physical therapy.  Increasing gabapentin at last exam with primary care provider.  Patient on ultrasound was found to have mild tenosynovitis of the dorsal aspect of the wrist was taking gabapentin 300 mg 3 times a day. Patient states she is still painful. Has been icing, exercising and massaging the area. Would like elbow injection today. States that she has pain all the time. Expecting the injection to help with pain. Lump on the the hand where she was bitten.     Past Medical History:  Diagnosis Date  . 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 Chicken pox as a child  . Depression    been on antidepressants on and off for 20 yr  . GERD (gastroesophageal reflux disease)    silent with hiatal hernia  . Hiatal hernia 05/22/2013  . Hiatal hernia with gastroesophageal  reflux 05/22/2013  . Hot tub folliculitis 01/19/5535  . Hyperglycemia 05/19/2016  . Hyperlipidemia 05/19/2016  . Measles as a child  . Postmenopausal atrophic vaginitis 05/22/2013  . Preventative health care 01/19/2015  . Shingles 64 yrs old  . Unspecified vitamin D deficiency 05/22/2013   Past Surgical History:  Procedure Laterality Date  . ABDOMINAL HYSTERECTOMY  1998   partial-still has ovaries  . BILIARY STENT PLACEMENT N/A 11/23/2019   Procedure: BILIARY STENT PLACEMENT;  Surgeon: Carol Ada, MD;  Location: [facility];  Service: Endoscopy;  Laterality: N/A;  . ERCP N/A 11/23/2019   Procedure: ENDOSCOPIC RETROGRADE CHOLANGIOPANCREATOGRAPHY (ERCP);  Surgeon: Carol Ada, MD;  Location: [facility];  Service: Endoscopy;  Laterality: N/A;  . ESOPHAGECTOMY  05-2008  . INDUCED ABORTION  1979  . REMOVAL OF STONES  11/23/2019   Procedure: REMOVAL OF STONES;  Surgeon: Carol Ada, MD;  Location: [facility];  Service: Endoscopy;;  . [name]  11/23/2019   Procedure: [name];  Surgeon: Carol Ada, MD;  Location: [facility];  Service: Endoscopy;;   Social History   Socioeconomic History  . Marital status: Married    Spouse name: [name]  . Number of children: Not on file  . Years of education: Not on file  . Highest education level: Not on file  Occupational History    Employer: [employer]    Comment: Event Planner  Tobacco Use  . Smoking status: Former Smoker  Packs/day: 0.10    Years: 25.00    Pack years: 2.50    Types: Cigarettes    Start date: 01/13/1998  . Smokeless tobacco: Never Used  . Tobacco comment: 1-2 cigarettes a day  Vaping Use  . Vaping Use: Never used  Substance and Sexual Activity  . Alcohol use: Yes    Comment: a couple glasses of wine every other day  . Drug use: No  . Sexual activity: Yes    Partners: Male    Comment: lives with husband, event planner, no dietary restrictions  Other Topics Concern  . 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 Difficulty of Paying Living Expenses: Not hard at all  Food Insecurity: No Food Insecurity  . Worried About Charity fundraiser in the Last Year: Never true  . Ran Out of Food in the Last Year: Never true  Transportation Needs: No Transportation Needs  . Lack of Transportation (Medical): No  . Lack of Transportation (Non-Medical): No  Physical Activity: Not on file  Stress: Not on file  Social Connections: Not on file   Allergies  Allergen Reactions  . Penicillins Hives and Nausea And Vomiting    PCN - Hives when she was 14 Amoxicillin - vomiting and chills (11/22/19)     Family History  Problem Relation Age of Onset  . Cancer Mother 82       breast cancer  . Emphysema Mother        smoker  . Heart attack Father        X several  . Mental illness Brother        bipolar, xanax and thc abuse  . Obesity Brother   . Depression Daughter   . Anxiety disorder Daughter   . Other Maternal Grandmother        blood clot, dvt, lung  . Cancer Paternal Grandmother        stomach  . Heart disease Paternal Grandfather     Current Outpatient Medications (Endocrine & Metabolic):  .  predniSONE (DELTASONE) 20 MG tablet, Take 2 tablets (40 mg total) by mouth daily with breakfast.  Current Outpatient Medications (Cardiovascular):  .  rosuvastatin (CRESTOR) 5 MG tablet, Take 1 tablet (5 mg total) by mouth daily.     Current Outpatient Medications (Other):  .  omeprazole (PRILOSEC) 20 MG capsule, Take 1 capsule (20 mg total) by mouth daily. Marland Kitchen  venlafaxine XR (EFFEXOR-XR) 150 MG 24 hr capsule, Take 1 capsule (150 mg total) by mouth daily with breakfast. .  gabapentin (NEURONTIN) 300 MG capsule, Take 1 capsule (300 mg total) by mouth at bedtime.   Reviewed prior external information including notes and imaging from  primary care provider As well as  notes that were available from care everywhere and other healthcare systems.  Past medical history, social, surgical and family history all reviewed in electronic medical record.  No pertanent information unless stated regarding to the chief complaint.   Review of Systems:  No headache, visual changes, nausea, vomiting, diarrhea, constipation, dizziness, abdominal pain, skin rash, fevers, chills, night sweats, weight loss, swollen lymph nodes, body aches, joint swelling, chest pain, shortness of breath, mood changes. POSITIVE muscle aches  Objective  Blood pressure 100/70, pulse 81, height 5\' 4"  (1.626 m), weight 124 lb (56.2 kg), SpO2 96 %.   General: No apparent  distress alert and oriented x3 mood and affect normal, dressed appropriately.  HEENT: Pupils equal, extraocular movements intact  Respiratory: Patient's speak in full sentences and does not appear short of breath  Left arm on the dorsal wrist still has some mild what appears to be inflammation.  No erythema no noted.  Minimal tenderness.  Patient does have some mild resisted extension of the wrist on noted.  Patient is tender to palpation a little bit over the medial aspect of the elbow.  Patient points more to the joint space.  No swelling noted.  Mild positive Tinel's. Regarding strength patient has full strength at the elbow but does have significant weakness in the C8 distribution of the hand.  This is 3 out of 5 strength compared to 5 out of 5 on the contralateral side. Neck exam does have some mild loss of lordosis.  Negative Spurling's though it seemed to be.  Limited musculoskeletal ultrasound was performed and interpreted by Lyndal Pulley  Limited ultrasound of patient's elbow does not show any significant hypoechoic changes.  Looking over the medial epicondylar region I do not see any type of cortical irregularity.  No joint effusion noted.  No increase in Doppler flow even of the area.  No significant dilatation of the ulnar  nerve noted.  Regarding patient's wrist still has the what appears to be hypoechoic changes within the tendon sheath of the third compartment dorsally Impression: Relatively normal ultrasound of the elbow but continued third and fourth dorsal wrist compartment tenosynovitis      Impression and Recommendations:     The above documentation has been reviewed and is accurate and complete Lyndal Pulley, DO

## 2019-12-30 ENCOUNTER — Ambulatory Visit: Payer: Medicare HMO | Admitting: Neurology

## 2019-12-30 ENCOUNTER — Other Ambulatory Visit: Payer: Self-pay

## 2019-12-30 DIAGNOSIS — R202 Paresthesia of skin: Secondary | ICD-10-CM

## 2019-12-30 DIAGNOSIS — G5622 Lesion of ulnar nerve, left upper limb: Secondary | ICD-10-CM

## 2019-12-30 NOTE — Procedures (Signed)
[facility]  [place], [place]  [place], [place] [address] Tel: [phone] Fax:  [phone] Test Date:  12/30/2019  Patient: Lauren [name] DOB: November 15, 1955 Physician: [name], DO  Sex: Female Height: 5\' 4"  Ref Phys: Hulan Saas, DO  ID#: [id number]   Technician:    Patient Complaints: This is a 64 year old female referred for evaluation of left hand paresthesias and weakness.  NCV & EMG Findings: Extensive electrodiagnostic testing of the left upper extremity shows:  1. Left median, ulnar, and mixed palmar sensory responses are within normal limits. 2. Left median motor response is within normal limits.  Left ulnar motor response at the abductor digiti minimi and first dorsal interosseous muscles shows slowed conduction velocity (A Elbow-B Elbow, L48, L45 m/s).   3. There is no evidence of active or chronic motor axonal loss changes affecting any of the tested muscles.  Motor unit configuration and recruitment pattern is within normal limits.    Impression: 1. Left ulnar neuropathy with slowing across the elbow, purely demyelinating, mild. 2. There is no evidence of a cervical radiculopathy or carpal tunnel syndrome affecting the left upper extremity.   ___________________________ [name], DO    Nerve Conduction Studies Anti Sensory Summary Table   Stim Site NR Peak (ms) Norm Peak (ms) P-T Amp (V) Norm P-T Amp  Left Median Anti Sensory (2nd Digit)  34C  Wrist    2.8 <3.8 46.4 >10  Left Ulnar Anti Sensory (5th Digit)  34C  Wrist    2.7 <3.2 37.4 >5   Motor Summary Table   Stim Site NR Onset (ms) Norm Onset (ms) O-P Amp (mV) Norm O-P Amp Site1 Site2 Delta-0 (ms) Dist (cm) Vel (m/s) Norm Vel (m/s)  Left Median Motor (Abd Poll Brev)  34C  Wrist    2.6 <4.0 9.1 >5 Elbow Wrist 4.4 26.0 59 >50  Elbow    7.0  8.6         Left Ulnar Motor (Abd Dig Minimi)  34C  Wrist    2.3 <3.1 8.1 >7 B Elbow Wrist 3.6 21.0 58 >50  B Elbow    5.9  7.5  A  Elbow B Elbow 2.1 10.0 48 >50  A Elbow    8.0  7.1         Left Ulnar (FDI) Motor (1st DI)  34C  Wrist    3.6 <4.5 7.8 >7 B Elbow Wrist 3.7 21.0 57 >50  B Elbow    7.3  7.8  A Elbow B Elbow 2.2 10.0 45 >50  A Elbow    9.5  7.7          Comparison Summary Table   Stim Site NR Peak (ms) Norm Peak (ms) P-T Amp (V) Site1 Site2 Delta-P (ms) Norm Delta (ms)  Left Median/Ulnar Palm Comparison (Wrist - 8cm)  34C  Median Palm    1.6 <2.2 100.6 Median Palm Ulnar Palm 0.0   Ulnar Palm    1.6 <2.2 21.5       EMG   Side Muscle Ins Act Fibs Psw Fasc Number Recrt Dur Dur. Amp Amp. Poly Poly. Comment  Left 1stDorInt Nml Nml Nml Nml Nml Nml Nml Nml Nml Nml Nml Nml N/A  Left ABD Dig Min Nml Nml Nml Nml Nml Nml Nml Nml Nml Nml Nml Nml N/A  Left FlexCarpiUln Nml Nml Nml Nml Nml Nml Nml Nml Nml Nml Nml Nml N/A  Left Ext Indicis Nml Nml Nml Nml Nml Nml  Nml Nml Nml Nml Nml Nml N/A  Left PronatorTeres Nml Nml Nml Nml Nml Nml Nml Nml Nml Nml Nml Nml N/A  Left Biceps Nml Nml Nml Nml Nml Nml Nml Nml Nml Nml Nml Nml N/A  Left Triceps Nml Nml Nml Nml Nml Nml Nml Nml Nml Nml Nml Nml N/A  Left Deltoid Nml Nml Nml Nml Nml Nml Nml Nml Nml Nml Nml Nml N/A      Waveforms:

## 2020-01-10 ENCOUNTER — Ambulatory Visit (INDEPENDENT_AMBULATORY_CARE_PROVIDER_SITE_OTHER): Payer: Medicare HMO

## 2020-01-10 ENCOUNTER — Ambulatory Visit: Payer: Medicare HMO | Admitting: Family Medicine

## 2020-01-10 ENCOUNTER — Encounter: Payer: Self-pay | Admitting: Family Medicine

## 2020-01-10 ENCOUNTER — Other Ambulatory Visit: Payer: Self-pay

## 2020-01-10 VITALS — BP 112/78 | HR 94 | Ht 64.0 in | Wt 123.4 lb

## 2020-01-10 DIAGNOSIS — M542 Cervicalgia: Secondary | ICD-10-CM

## 2020-01-10 DIAGNOSIS — G5622 Lesion of ulnar nerve, left upper limb: Secondary | ICD-10-CM | POA: Diagnosis not present

## 2020-01-10 DIAGNOSIS — M2550 Pain in unspecified joint: Secondary | ICD-10-CM | POA: Diagnosis not present

## 2020-01-10 DIAGNOSIS — M791 Myalgia, unspecified site: Secondary | ICD-10-CM

## 2020-01-10 LAB — COMPREHENSIVE METABOLIC PANEL
ALT: 34 U/L (ref 0–35)
AST: 35 U/L (ref 0–37)
Albumin: 4.3 g/dL (ref 3.5–5.2)
Alkaline Phosphatase: 92 U/L (ref 39–117)
BUN: 16 mg/dL (ref 6–23)
CO2: 31 mEq/L (ref 19–32)
Calcium: 9.9 mg/dL (ref 8.4–10.5)
Chloride: 97 mEq/L (ref 96–112)
Creatinine, Ser: 0.98 mg/dL (ref 0.40–1.20)
GFR: 59.98 mL/min — ABNORMAL LOW (ref >60.00)
Glucose, Bld: 103 mg/dL — ABNORMAL HIGH (ref 70–99)
Potassium: 4.6 mEq/L (ref 3.5–5.1)
Sodium: 134 mEq/L — ABNORMAL LOW (ref 135–145)
Total Bilirubin: 0.6 mg/dL (ref 0.2–1.2)
Total Protein: 7.7 g/dL (ref 6.0–8.3)

## 2020-01-10 LAB — SEDIMENTATION RATE: Sed Rate: 70 mm/hr — ABNORMAL HIGH (ref 0–30)

## 2020-01-10 LAB — CK: Total CK: 36 U/L (ref 7–177)

## 2020-01-10 NOTE — Progress Notes (Signed)
I, Lauren [name], LAT, ATC, am serving as scribe for Dr. [name].  Lauren [name] is a 64 y.o. female who presents to [place] [facility] today for f/u of L elbow/arm pain.  She was last seen by Dr. Tamala Julian on 12/27/19 w/ reports of con't L arm pain after seeing [name] on 12/07/19.  She was prescribed a short course of prednisone and re-prescribed Gabapentin.  She was also referred for a NCV test that she had on 12/30/19.  Since her last visit, pt reports that she has been having sporadic pain from her L calf to her L t-spine to her L tricep.  She is also having numbness/tingling in her L upper arm/tricep but nothing below the elbow.  She denies neck pain but notes that at her last visit w/ Dr. Tamala Julian, he felt her symptoms were likely stemming from her neck.  She states that the prednisone was very helpful w/ her pain.  Her elbow is feeling a lot better with the course of prednisone and gabapentin prescribed by Dr. Tamala Julian 2 weeks ago.  She notes diffuse muscle aches and pains scattered across her body including into her left arm, left calf, and even left foot. She denies any personal family history of rheumatologic problems, inflammatory bowel disease, or psoriasis.  Diagnostic imaging: L UE NCV/EMG- 12/30/19; L elbow XR- 12/27/19  Pertinent review of systems: No fevers or chills  Relevant historical information: Esophageal cancer 2010   Exam:  BP 112/78 (BP Location: Right Arm, Patient Position: Sitting, Cuff Size: Normal)   Pulse 94   Ht 5\' 4"  (1.626 m)   Wt 123 lb 6.4 oz (56 kg)   SpO2 97%   BMI 21.18 kg/m  General: Well Developed, well nourished, and in no acute distress.   MSK: Left elbow normal-appearing normal motion mildly tender to palpation in cubital tunnel.    Lab and Radiology Results  Patient Complaints: This is a 64 year old female referred for evaluation of left hand paresthesias and weakness.  NCV & EMG Findings: Extensive  electrodiagnostic testing of the left upper extremity shows:  1. Left median, ulnar, and mixed palmar sensory responses are within normal limits. 2. Left median motor response is within normal limits.  Left ulnar motor response at the abductor digiti minimi and first dorsal interosseous muscles shows slowed conduction velocity (A Elbow-B Elbow, L48, L45 m/s).   3. There is no evidence of active or chronic motor axonal loss changes affecting any of the tested muscles.  Motor unit configuration and recruitment pattern is within normal limits.    Impression: 1. Left ulnar neuropathy with slowing across the elbow, purely demyelinating, mild. 2. There is no evidence of a cervical radiculopathy or carpal tunnel syndrome affecting the left upper extremity.   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 No acute fracture or severe deformity. Await formal radiology review   Assessment and Plan: 64 y.o. female with left elbow pain.  Plan to be ulnar neuritis and cubital tunnel.  Fortunately this is improving with oral prednisone and gabapentin.  I think the fundamental underlying problem here is probably increased pressure on her elbow from having on her arm still after her hand infection.  Recommend treatment for cubital tunnel syndrome.  If not improving could consider steroid injection.  Diffuse myalgias and arthralgias.  Unclear etiology.  Plan for rheumatologic work-up listed  below. Recheck in 1 month.  PDMP not reviewed this encounter. Orders Placed This Encounter  Procedures  . DG Cervical Spine Complete    Standing Status:   Future    Number of Occurrences:   1    Standing Expiration Date:   02/10/2020    Order Specific Question:   Reason for Exam (SYMPTOM  OR DIAGNOSIS REQUIRED)    Answer:   neck pain    Order Specific Question:    Preferred imaging location?    Answer:   [name]  . Comprehensive metabolic panel    Standing Status:   Future    Number of Occurrences:   1    Standing Expiration Date:   01/09/2021  . Sedimentation rate    Standing Status:   Future    Number of Occurrences:   1    Standing Expiration Date:   01/09/2021  . Rheumatoid factor    Standing Status:   Future    Number of Occurrences:   1    Standing Expiration Date:   01/09/2021  . ANA    Standing Status:   Future    Number of Occurrences:   1    Standing Expiration Date:   01/09/2021  . Cyclic citrul peptide antibody, IgG    Standing Status:   Future    Number of Occurrences:   1    Standing Expiration Date:   01/09/2021  . CK    Standing Status:   Future    Number of Occurrences:   1    Standing Expiration Date:   01/09/2021  . Complement, total    Standing Status:   Future    Number of Occurrences:   1    Standing Expiration Date:   01/09/2021   No orders of the defined types were placed in this encounter.    Discussed warning signs or symptoms. Please see discharge instructions. Patient expresses understanding.   The above documentation has been reviewed and is accurate and complete Lauren [name], M.D.

## 2020-01-10 NOTE — Patient Instructions (Signed)
Thank you for coming in today.  The nerve test suggest cubital tunnel syndrome.  You situation is not normal. I think you have the nerve injury mostly because of the cat bite and a period of arm rest and possibly increase pressure on the nerve.   Try using a night splint.   If not better we can inject the nerve are in the elbow.   We are doing labs today to try to rule out the causes of the various aches and pains you have.   Recheck in a month.

## 2020-01-11 NOTE — Progress Notes (Signed)
You do have some possible areas of pinched nerve affecting the left arm at C5-6 and C6-7 based on the x-ray.  If needed in the future could proceed with MRI cervical spine.

## 2020-01-12 NOTE — Progress Notes (Signed)
Sedimentation rate is elevated at 70.  This indicates inflammation.  Your recent infection could possibly explain this.  CK a marker of muscle inflammation is normal.  Metabolic panel is close to your baseline.  Other labs are still pending

## 2020-01-17 LAB — ANA: Anti Nuclear Antibody (ANA): POSITIVE — AB

## 2020-01-17 LAB — COMPLEMENT, TOTAL

## 2020-01-17 LAB — CYCLIC CITRUL PEPTIDE ANTIBODY, IGG: Cyclic Citrullin Peptide Ab: <16 UNITS

## 2020-01-17 LAB — RHEUMATOID FACTOR: Rheumatoid fact SerPl-aCnc: <14 IU/mL (ref <14)

## 2020-01-17 LAB — ANTI-NUCLEAR AB-TITER (ANA TITER): ANA Titer 1: 1:40 {titer} — ABNORMAL HIGH

## 2020-01-17 NOTE — Progress Notes (Signed)
Your ANA is positive however the titer (a measurement of how positive it is) is 1: 40.  This indicates that you probably do not have lupus or other lupus associated diseases.  Your sedimentation rate was elevated.  I think you probably do not have a rheumatologic disease however I am happy to refer you to a rheumatologist for further evaluation and testing if you would like.  Please let me know.

## 2020-02-09 NOTE — Progress Notes (Deleted)
   I, Peterson Lombard, LAT, ATC acting as a scribe for Lauren Leader, MD.  Lauren [name] is a 65 y.o. female who presents to [place] at [facility] today for f/u L elbow/arm pain due to cubital tunnel syndrome and ulnar neuritis. Pt was last seen by [name] on 01/10/20 and was advised to get a rheumatologic work-up to further investigate pt's myalgias and arthralgias. Today, pt reports //.  Dx imaging: 01/10/20 C-spine XR  12/30/19 L UE NCV/EMG  12/14/21L elbow XR  Pertinent review of systems: ***  Relevant historical information: ***   Exam:  There were no vitals taken for this visit. General: Well Developed, well nourished, and in no acute distress.   MSK: ***    Lab and Radiology Results No results found for this or any previous visit (from the past 72 hour(s)). No results found.     Assessment and Plan: 65 y.o. female with ***   PDMP not reviewed this encounter. No orders of the defined types were placed in this encounter.  No orders of the defined types were placed in this encounter.    Discussed warning signs or symptoms. Please see discharge instructions. Patient expresses understanding.   ***

## 2020-02-10 ENCOUNTER — Ambulatory Visit: Payer: Medicare HMO | Admitting: Family Medicine

## 2020-03-26 ENCOUNTER — Telehealth: Payer: Self-pay | Admitting: Family Medicine

## 2020-03-26 NOTE — Telephone Encounter (Signed)
Pt called stating has a concern, she received a bill indicating has a no show fee for the date of 12-12-2019 with Dr Charlett Blake, pt mentioned had called to cancel appt 24 hrs ahead of time and is still getting charge for a no show fee $50.00. Please advise. Pt also mentioned checks her mychart account and it shows no balance but is receiving a bill of $150. - pt had 2 same medical records that is being merged. MRN [id number] and MRN [id number].

## 2020-03-27 DIAGNOSIS — L723 Sebaceous cyst: Secondary | ICD-10-CM | POA: Diagnosis not present

## 2020-03-28 NOTE — Telephone Encounter (Signed)
Spoke with patient and advised her I will email Charge [employer] to ask for No Show Fee ($50.00) to be removed from her account for DOS 12/12/2019.  I was also able to advise patient of the other balances totaling the remainder of the $100.00 and the other offices those amounts came from and she was appreciative for that information.

## 2020-04-10 ENCOUNTER — Encounter: Payer: Self-pay | Admitting: Family Medicine

## 2020-04-10 ENCOUNTER — Other Ambulatory Visit: Payer: Self-pay | Admitting: Family Medicine

## 2020-04-10 MED ORDER — ALBENDAZOLE 200 MG PO TABS: 400.0000 mg | ORAL_TABLET | ORAL | 0 refills | Status: DC

## 2020-04-12 ENCOUNTER — Encounter: Payer: Self-pay | Admitting: Family Medicine

## 2020-04-12 ENCOUNTER — Other Ambulatory Visit: Payer: Self-pay

## 2020-04-12 ENCOUNTER — Ambulatory Visit (INDEPENDENT_AMBULATORY_CARE_PROVIDER_SITE_OTHER): Payer: Medicare HMO | Admitting: Family Medicine

## 2020-04-12 VITALS — BP 110/66 | HR 84 | Temp 98.3°F | Resp 16 | Ht 64.0 in | Wt 124.2 lb

## 2020-04-12 DIAGNOSIS — R739 Hyperglycemia, unspecified: Secondary | ICD-10-CM | POA: Diagnosis not present

## 2020-04-12 DIAGNOSIS — E785 Hyperlipidemia, unspecified: Secondary | ICD-10-CM

## 2020-04-12 DIAGNOSIS — Z0000 Encounter for general adult medical examination without abnormal findings: Secondary | ICD-10-CM | POA: Diagnosis not present

## 2020-04-12 DIAGNOSIS — E559 Vitamin D deficiency, unspecified: Secondary | ICD-10-CM | POA: Diagnosis not present

## 2020-04-12 DIAGNOSIS — G5622 Lesion of ulnar nerve, left upper limb: Secondary | ICD-10-CM

## 2020-04-12 DIAGNOSIS — B80 Enterobiasis: Secondary | ICD-10-CM | POA: Diagnosis not present

## 2020-04-12 LAB — LIPID PANEL
Cholesterol: 190 mg/dL (ref 0–200)
HDL: 79.8 mg/dL (ref >39.00)
LDL Cholesterol: 95 mg/dL (ref 0–99)
NonHDL: 110.24
Total CHOL/HDL Ratio: 2
Triglycerides: 77 mg/dL (ref 0.0–149.0)
VLDL: 15.4 mg/dL (ref 0.0–40.0)

## 2020-04-12 LAB — CBC WITH DIFFERENTIAL/PLATELET
Basophils Absolute: 0 10*3/uL (ref 0.0–0.1)
Basophils Relative: 0.7 % (ref 0.0–3.0)
Eosinophils Absolute: 0.2 10*3/uL (ref 0.0–0.7)
Eosinophils Relative: 2.3 % (ref 0.0–5.0)
HCT: 43.1 % (ref 36.0–46.0)
Hemoglobin: 14.1 g/dL (ref 12.0–15.0)
Lymphocytes Relative: 19.8 % (ref 12.0–46.0)
Lymphs Abs: 1.3 10*3/uL (ref 0.7–4.0)
MCHC: 32.7 g/dL (ref 30.0–36.0)
MCV: 95.1 fl (ref 78.0–100.0)
Monocytes Absolute: 0.6 10*3/uL (ref 0.1–1.0)
Monocytes Relative: 8.5 % (ref 3.0–12.0)
Neutro Abs: 4.6 10*3/uL (ref 1.4–7.7)
Neutrophils Relative %: 68.7 % (ref 43.0–77.0)
Platelets: 347 10*3/uL (ref 150.0–400.0)
RBC: 4.53 Mil/uL (ref 3.87–5.11)
RDW: 14.6 % (ref 11.5–15.5)
WBC: 6.6 10*3/uL (ref 4.0–10.5)

## 2020-04-12 LAB — HEMOGLOBIN A1C: Hgb A1c MFr Bld: 6.2 % (ref 4.6–6.5)

## 2020-04-12 LAB — COMPREHENSIVE METABOLIC PANEL
ALT: 17 U/L (ref 0–35)
AST: 19 U/L (ref 0–37)
Albumin: 4.4 g/dL (ref 3.5–5.2)
Alkaline Phosphatase: 84 U/L (ref 39–117)
BUN: 15 mg/dL (ref 6–23)
CO2: 33 mEq/L — ABNORMAL HIGH (ref 19–32)
Calcium: 9.7 mg/dL (ref 8.4–10.5)
Chloride: 101 mEq/L (ref 96–112)
Creatinine, Ser: 0.88 mg/dL (ref 0.40–1.20)
GFR: 68.12 mL/min (ref >60.00)
Glucose, Bld: 111 mg/dL — ABNORMAL HIGH (ref 70–99)
Potassium: 5 mEq/L (ref 3.5–5.1)
Sodium: 140 mEq/L (ref 135–145)
Total Bilirubin: 0.4 mg/dL (ref 0.2–1.2)
Total Protein: 7 g/dL (ref 6.0–8.3)

## 2020-04-12 LAB — TSH: TSH: 1.59 u[IU]/mL (ref 0.35–4.50)

## 2020-04-12 NOTE — Assessment & Plan Note (Addendum)
Encouraged heart healthy diet, increase exercise, avoid trans fats, consider a krill oil cap daily. Statin for 6 months. Check levels

## 2020-04-12 NOTE — Assessment & Plan Note (Signed)
hgba1c acceptable, minimize simple carbs. Increase exercise as tolerated.  

## 2020-04-12 NOTE — Assessment & Plan Note (Signed)
Left arm after a cat bite in November and a bad infection. Resolved after use of Gabapentin and Oxy

## 2020-04-12 NOTE — Assessment & Plan Note (Signed)
Supplement and monitor 

## 2020-04-12 NOTE — Patient Instructions (Signed)
Preventive Care 67 Years and Older, Female Preventive care refers to lifestyle choices and visits with your health care provider that can promote health and wellness. This includes:  A yearly physical exam. This is also called an annual wellness visit.  Regular dental and eye exams.  Immunizations.  Screening for certain conditions.  Healthy lifestyle choices, such as: ? Eating a healthy diet. ? Getting regular exercise. ? Not using drugs or products that contain nicotine and tobacco. ? Limiting alcohol use. What can I expect for my preventive care visit? Physical exam Your health care provider will check your:  Height and weight. These may be used to calculate your BMI (body mass index). BMI is a measurement that tells if you are at a healthy weight.  Heart rate and blood pressure.  Body temperature.  Skin for abnormal spots. Counseling Your health care provider may ask you questions about your:  Past medical problems.  Family's medical history.  Alcohol, tobacco, and drug use.  Emotional well-being.  Home life and relationship well-being.  Sexual activity.  Diet, exercise, and sleep habits.  History of falls.  Memory and ability to understand (cognition).  Work and work Statistician.  Pregnancy and menstrual history.  Access to firearms. What immunizations do I need? Vaccines are usually given at various ages, according to a schedule. Your health care provider will recommend vaccines for you based on your age, medical history, and lifestyle or other factors, such as travel or where you work.   What tests do I need? Blood tests  Lipid and cholesterol levels. These may be checked every 5 years, or more often depending on your overall health.  Hepatitis C test.  Hepatitis B test. Screening  Lung cancer screening. You may have this screening every year starting at age 67 if you have a 30-pack-year history of smoking and currently smoke or have quit within  the past 15 years.  Colorectal cancer screening. ? All adults should have this screening starting at age 67 and continuing until age 67. ? Your health care provider may recommend screening at age 67 if you are at increased risk. ? You will have tests every 1-10 years, depending on your results and the type of screening test.  Diabetes screening. ? This is done by checking your blood sugar (glucose) after you have not eaten for a while (fasting). ? You may have this done every 1-3 years.  Mammogram. ? This may be done every 1-2 years. ? Talk with your health care provider about how often you should have regular mammograms.  Abdominal aortic aneurysm (AAA) screening. You may need this if you are a current or former smoker.  BRCA-related cancer screening. This may be done if you have a family history of breast, ovarian, tubal, or peritoneal cancers. Other tests  STD (sexually transmitted disease) testing, if you are at risk.  Bone density scan. This is done to screen for osteoporosis. You may have this done starting at age 67 Talk with your health care provider about your test results, treatment options, and if necessary, the need for more tests. Follow these instructions at home: Eating and drinking  Eat a diet that includes fresh fruits and vegetables, whole grains, lean protein, and low-fat dairy products. Limit your intake of foods with high amounts of sugar, saturated fats, and salt.  Take vitamin and mineral supplements as recommended by your health care provider.  Do not drink alcohol if your health care provider tells you not to drink.  If you drink alcohol: ? Limit how much you have to 0-1 drink a day. ? Be aware of how much alcohol is in your drink. In the [place] one drink equals one 12 oz bottle of beer (355 mL), one 5 oz glass of wine (148 mL), or one 1 oz glass of hard liquor (44 mL).   Lifestyle  Take daily care of your teeth and gums. Brush your teeth every morning  and night with fluoride toothpaste. Floss one time each day.  Stay active. Exercise for at least 30 minutes 5 or more days each week.  Do not use any products that contain nicotine or tobacco, such as cigarettes, e-cigarettes, and chewing tobacco. If you need help quitting, ask your health care provider.  Do not use drugs.  If you are sexually active, practice safe sex. Use a condom or other form of protection in order to prevent STIs (sexually transmitted infections).  Talk with your health care provider about taking a low-dose aspirin or statin.  Find healthy ways to cope with stress, such as: ? Meditation, yoga, or listening to music. ? Journaling. ? Talking to a trusted person. ? Spending time with friends and family. Safety  Always wear your seat belt while driving or riding in a vehicle.  Do not drive: ? If you have been drinking alcohol. Do not ride with someone who has been drinking. ? When you are tired or distracted. ? While texting.  Wear a helmet and other protective equipment during sports activities.  If you have firearms in your house, make sure you follow all gun safety procedures. What's next?  Visit your health care provider once a year for an annual wellness visit.  Ask your health care provider how often you should have your eyes and teeth checked.  Stay up to date on all vaccines. This information is not intended to replace advice given to you by your health care provider. Make sure you discuss any questions you have with your health care provider. Document Revised: 12/21/2019 Document Reviewed: 12/24/2017 Elsevier Patient Education  2021 Elsevier Inc.  

## 2020-04-12 NOTE — Assessment & Plan Note (Addendum)
Patient encouraged to maintain heart healthy diet, regular exercise, adequate sleep. Consider daily probiotics. Take medications as prescribed. Labs ordered and reviewed. Gym 3 x a week and very active. Dexa in 2019 normal repeat in 5 year mark. She is status post TAH does not perform paps

## 2020-04-12 NOTE — Progress Notes (Signed)
Patient ID: Lauren [name], female    DOB: 08-23-1955  Age: 65 y.o. MRN: [id number]    Subjective:  Subjective  HPI OFFIE PICKRON presents for presents for comprehensive physical exam today and follow up on management of chronic concerns. She suspects having a pinworms infection because she reports that her family have contracted it and she started feeling the symptoms and expresses the need to get treatment for it. She reports itching, cramping, diarrhea, for 3 days now. She experiences x2 episodes of loose stool a day. She states that all symptoms come spontaneously at the same time. She is hoping to get it treated soon, because she is going to travel. She states taking medication to treat the infection, but reports that it did not help alleviate symptoms. She denies any chest pain, SOB, fever, abdominal pain, cough, chills, sore throat, dysuria, urinary incontinence, back pain, HA, or N/V. She denies any FMHx of any illnesses. She states that she works out at [employer] x3 a week and remains physically active in her daily life. She reports that a cat attacked her in her left elbow region and she had to go to the ED to get it treated. She states that while at the ED, she found out that her liver numbers were too high and it was indicated that she had kidney stones that caused a blockage. She states that her specialist removed the stones and applied a stent, but she reports it was not removed yet. She states that she had to get pain medication to treat the symptoms. She denies taking supplements for vitamin D.   Review of Systems  Constitutional: Negative for chills, fatigue and fever.  HENT: Negative for congestion, rhinorrhea, sinus pressure, sinus pain and sore throat.   Eyes: Negative for pain.  Respiratory: Negative for cough and shortness of breath.   Cardiovascular: Negative for chest pain, palpitations and leg swelling.  Gastrointestinal: Positive for diarrhea. Negative for abdominal pain,  blood in stool, nausea and vomiting.       (+) Cramps  Genitourinary: Negative for decreased urine volume, flank pain, frequency, vaginal bleeding and vaginal discharge.       (+) Vaginal itching  Musculoskeletal: Negative for back pain.  Neurological: Negative for headaches.    History Past Medical History:  Diagnosis Date  . 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 Chicken pox as a child  . Depression    been on antidepressants on and off for 20 yr  . GERD (gastroesophageal reflux disease)    silent with hiatal hernia  . Hiatal hernia 05/22/2013  . Hiatal hernia with gastroesophageal reflux 05/22/2013  . Hot tub folliculitis 35/04/6566  . Hyperglycemia 05/19/2016  . Hyperlipidemia 05/19/2016  . Measles as a child  . Postmenopausal atrophic vaginitis 05/22/2013  . Preventative health care 01/19/2015  . Shingles 65 yrs old  . Unspecified vitamin D deficiency 05/22/2013    She has a past surgical history that includes Abdominal hysterectomy (1998); Esophagectomy (05-2008); Induced abortion (1979); ERCP (N/A, 11/23/2019); sphincterotomy (11/23/2019); biliary stent placement (N/A, 11/23/2019); and removal of stones (11/23/2019).   Her family history includes Anxiety disorder in her daughter; Cancer in her paternal grandmother; Cancer (age of onset: 66) in her mother; Depression in her daughter; Emphysema in her mother; Heart attack in her father; Heart disease in her paternal grandfather; Mental illness in her brother; Obesity in her brother; Other in her maternal grandmother.She reports that she has quit smoking. Her smoking use included  cigarettes. She started smoking about 22 years ago. She has a 2.50 pack-year smoking history. She has never used smokeless tobacco. She reports current alcohol use. She reports that she does not use drugs.  Current Outpatient Medications on File Prior to Visit  Medication Sig Dispense Refill  . albendazole (ALBENZA) 200 MG tablet Take 2 tablets (400 mg total) by  mouth once a week. 4 tablet 0  . omeprazole (PRILOSEC) 20 MG capsule Take 1 capsule (20 mg total) by mouth daily. 90 capsule 3  . rosuvastatin (CRESTOR) 5 MG tablet Take 1 tablet (5 mg total) by mouth daily.    Marland Kitchen venlafaxine XR (EFFEXOR-XR) 150 MG 24 hr capsule Take 1 capsule (150 mg total) by mouth daily with breakfast. 90 capsule 3   No current facility-administered medications on file prior to visit.     Objective:  Objective  Physical Exam Constitutional:      General: She is not in acute distress.    Appearance: Normal appearance. She is not ill-appearing or toxic-appearing.  HENT:     Head: Normocephalic and atraumatic.     Right Ear: Tympanic membrane, ear canal and external ear normal.     Left Ear: Tympanic membrane, ear canal and external ear normal.     Nose: No congestion or rhinorrhea.  Eyes:     Extraocular Movements: Extraocular movements intact.     Pupils: Pupils are equal, round, and reactive to light.     Comments: No nystagmus  Cardiovascular:     Rate and Rhythm: Normal rate and regular rhythm.     Pulses: Normal pulses.     Heart sounds: Normal heart sounds. No murmur heard.   Pulmonary:     Effort: Pulmonary effort is normal. No respiratory distress.     Breath sounds: Normal breath sounds. No wheezing, rhonchi or rales.  Abdominal:     General: Bowel sounds are normal.     Palpations: Abdomen is soft. There is no mass.     Tenderness: There is no abdominal tenderness. There is no guarding.     Hernia: No hernia is present.  Musculoskeletal:        General: Normal range of motion.     Cervical back: Normal range of motion and neck supple.  Skin:    General: Skin is warm and dry.  Neurological:     Mental Status: She is alert and oriented to person, place, and time.  Psychiatric:        Behavior: Behavior normal.    BP 110/66   Pulse 84   Temp 98.3 F (36.8 C)   Resp 16   Ht 5\' 4"  (1.626 m)   Wt 124 lb 3.2 oz (56.3 kg)   SpO2 99%   BMI  21.32 kg/m  Wt Readings from Last 3 Encounters:  04/12/20 124 lb 3.2 oz (56.3 kg)  01/10/20 123 lb 6.4 oz (56 kg)  12/27/19 124 lb (56.2 kg)     Lab Results  Component Value Date   WBC 6.6 04/12/2020   HGB 14.1 04/12/2020   HCT 43.1 04/12/2020   PLT 347.0 04/12/2020   GLUCOSE 111 (H) 04/12/2020   CHOL 190 04/12/2020   TRIG 77.0 04/12/2020   HDL 79.80 04/12/2020   LDLCALC 95 04/12/2020   ALT 17 04/12/2020   AST 19 04/12/2020   NA 140 04/12/2020   K 5.0 04/12/2020   CL 101 04/12/2020   CREATININE 0.88 04/12/2020   BUN 15 04/12/2020   CO2  33 (H) 04/12/2020   TSH 1.59 04/12/2020   INR 1.0 05/04/2008   HGBA1C 6.2 04/12/2020    DG Wrist Complete Left  Result Date: 11/21/2019 CLINICAL DATA:  Swelling post cat bites. EXAM: LEFT WRIST - COMPLETE 3+ VIEW COMPARISON:  None. FINDINGS: No fracture or dislocation. Normal mineralization. Mild degenerative changes in the first [facility], radiocarpal articulation and distal ulna. Carpal rows intact. There is diffuse soft tissue swelling. No radiodense foreign body or subcutaneous gas. IMPRESSION: Soft tissue swelling without fracture, foreign body, or other acute bone abnormality. Electronically Signed   By: Lucrezia Europe M.D.   On: 11/21/2019 13:24   US Abdomen Complete  Result Date: 11/22/2019 CLINICAL DATA:  Elevated LFT EXAM: ABDOMEN ULTRASOUND COMPLETE COMPARISON:  None. FINDINGS: Gallbladder: Cholelithiasis. Largest gallstone 9 mm. No gallbladder wall thickening. Negative for sonographic Murphy sign. Common bile duct: Diameter: Markedly dilated 19 mm. 5.7 mm calculus distal common bile duct. Intrahepatic bile duct dilatation also noted. Liver: No focal lesion identified. Within normal limits in parenchymal echogenicity. Portal vein is patent on color Doppler imaging with normal direction of blood flow towards the liver. IVC: No abnormality visualized. Pancreas: Visualized portion unremarkable. Spleen: Size and appearance within normal limits.  Right Kidney: Length: 10.0 mm. Echogenicity within normal limits. No mass or hydronephrosis visualized. Left Kidney: Length: 10.1 mm. Echogenicity within normal limits. No mass or hydronephrosis visualized. Abdominal aorta: No aneurysm visualized. Other findings: None. IMPRESSION: Cholelithiasis without evidence of cholecystitis. Markedly dilated common bile duct with distal common bile duct stone causing obstruction. Electronically Signed   By: Franchot Gallo M.D.   On: 11/22/2019 08:01     Assessment & Plan:  Plan    No orders of the defined types were placed in this encounter.   Problem List Items Addressed This Visit    Vitamin D deficiency    Supplement and monitor      Relevant Orders   Vitamin D 1,25 dihydroxy   Preventative health care    Patient encouraged to maintain heart healthy diet, regular exercise, adequate sleep. Consider daily probiotics. Take medications as prescribed. Labs ordered and reviewed. Gym 3 x a week and very active. Dexa in 2019 normal repeat in 5 year mark. She is status post TAH does not perform paps      Hyperglycemia    hgba1c acceptable, minimize simple carbs. Increase exercise as tolerated.       Relevant Orders   Hemoglobin A1c (Completed)   Hyperlipidemia - Primary    Encouraged heart healthy diet, increase exercise, avoid trans fats, consider a krill oil cap daily. Statin for 6 months. Check levels      Relevant Orders   CBC with Differential/Platelet (Completed)   Comprehensive metabolic panel (Completed)   TSH (Completed)   Lipid panel (Completed)   Ulnar neuropathy    Left arm after a cat bite in November and a bad infection. Resolved after use of Gabapentin and Oxy      Pinworm infection    Patient is working with 2 refugee families with pinworm and has been experiencing some cramping and rectal irritation and itching.. she has started Pyrantel as of yesterday and she will do a repeat treatment in 2 weeks. Can do a course of  Abendazole if symptomms persist but cannot do the treatment until a week after a dose of pyrantel.        Other Visit Diagnoses    Encounter for Medicare annual wellness exam  Relevant Orders   Hemoglobin A1c (Completed)   CBC with Differential/Platelet (Completed)   Comprehensive metabolic panel (Completed)   TSH (Completed)   Lipid panel (Completed)   Vitamin D 1,25 dihydroxy     Mammo: Last completed on 10/11/2018, results were normal, repeated every 1 year.  Dexa: Last completed on 04/24/2017, results were normal, repeat every 3-5 years.  Colonoscopy: Last completed on 03/02/2015, results were normal, repeat every 10 years.  Pap smear: Last completed on 02/08/2008, repeat every 5 years.   Follow-up: Return in about 6 months (around 10/12/2020).   I,David Hanna,acting as a scribe for Penni Homans, MD.,have documented all relevant documentation on the behalf of Penni Homans, MD,as directed by  Penni Homans, MD while in the presence of Penni Homans, MD.  I, [name], MD personally performed the services described in this documentation. All medical record entries made by the scribe were at my direction and in my presence. I have reviewed the chart and agree that the record reflects my personal performance and is accurate and complete

## 2020-04-13 DIAGNOSIS — B80 Enterobiasis: Secondary | ICD-10-CM | POA: Insufficient documentation

## 2020-04-13 NOTE — Assessment & Plan Note (Signed)
Patient is working with 2 refugee families with pinworm and has been experiencing some cramping and rectal irritation and itching.. she has started Pyrantel as of yesterday and she will do a repeat treatment in 2 weeks. Can do a course of Abendazole if symptomms persist but cannot do the treatment until a week after a dose of pyrantel.

## 2020-04-16 LAB — VITAMIN D 1,25 DIHYDROXY
Vitamin D 1, 25 (OH)2 Total: 47 pg/mL (ref 18–72)
Vitamin D2 1, 25 (OH)2: <8 pg/mL
Vitamin D3 1, 25 (OH)2: 47 pg/mL

## 2020-04-18 ENCOUNTER — Other Ambulatory Visit: Payer: Self-pay | Admitting: Gastroenterology

## 2020-05-02 ENCOUNTER — Ambulatory Visit (INDEPENDENT_AMBULATORY_CARE_PROVIDER_SITE_OTHER): Payer: Medicare HMO

## 2020-05-02 VITALS — Ht 64.0 in | Wt 124.0 lb

## 2020-05-02 DIAGNOSIS — Z0000 Encounter for general adult medical examination without abnormal findings: Secondary | ICD-10-CM

## 2020-05-02 NOTE — Patient Instructions (Signed)
Lauren [name] , Thank you for taking time to complete your Medicare Wellness Visit. I appreciate your ongoing commitment to your health goals. Please review the following plan we discussed and let me know if I can assist you in the future.   Screening recommendations/referrals: Colonoscopy: Completed 02/22/2015-Due 02/21/2025 Mammogram: Ordered today. Someone will be calling you to schedule. Bone Density: Ordered today. Someone will be calling you to schedule. Recommended yearly ophthalmology/optometry visit for glaucoma screening and checkup Recommended yearly dental visit for hygiene and checkup  Vaccinations: Influenza vaccine: Up to date Pneumococcal vaccine: Up to date-Prevnar-13 due 11/21/2020 Tdap vaccine: Up to date-Due-11/07/2024 Shingles vaccine: Completed vaccines   Covid-19:Completed vaccines  Advanced directives: Please bring a copy for your chart  Conditions/risks identified: See problem list  Next appointment: Follow up in one year for your annual wellness visit.   Preventive Care 48 Years and Older, Female Preventive care refers to lifestyle choices and visits with your health care provider that can promote health and wellness. What does preventive care include?  A yearly physical exam. This is also called an annual well check.  Dental exams once or twice a year.  Routine eye exams. Ask your health care provider how often you should have your eyes checked.  Personal lifestyle choices, including:  Daily care of your teeth and gums.  Regular physical activity.  Eating a healthy diet.  Avoiding tobacco and drug use.  Limiting alcohol use.  Practicing safe sex.  Taking low-dose aspirin every day.  Taking vitamin and mineral supplements as recommended by your health care provider. What happens during an annual well check? The services and screenings done by your health care provider during your annual well check will depend on your age, overall health,  lifestyle risk factors, and family history of disease. Counseling  Your health care provider may ask you questions about your:  Alcohol use.  Tobacco use.  Drug use.  Emotional well-being.  Home and relationship well-being.  Sexual activity.  Eating habits.  History of falls.  Memory and ability to understand (cognition).  Work and work Statistician.  Reproductive health. Screening  You may have the following tests or measurements:  Height, weight, and BMI.  Blood pressure.  Lipid and cholesterol levels. These may be checked every 5 years, or more frequently if you are over 58 years old.  Skin check.  Lung cancer screening. You may have this screening every year starting at age 47 if you have a 30-pack-year history of smoking and currently smoke or have quit within the past 15 years.  Fecal occult blood test (FOBT) of the stool. You may have this test every year starting at age 20.  Flexible sigmoidoscopy or colonoscopy. You may have a sigmoidoscopy every 5 years or a colonoscopy every 10 years starting at age 7.  Hepatitis C blood test.  Hepatitis B blood test.  Sexually transmitted disease (STD) testing.  Diabetes screening. This is done by checking your blood sugar (glucose) after you have not eaten for a while (fasting). You may have this done every 1-3 years.  Bone density scan. This is done to screen for osteoporosis. You may have this done starting at age 35.  Mammogram. This may be done every 1-2 years. Talk to your health care provider about how often you should have regular mammograms. Talk with your health care provider about your test results, treatment options, and if necessary, the need for more tests. Vaccines  Your health care provider may recommend certain vaccines,  such as:  Influenza vaccine. This is recommended every year.  Tetanus, diphtheria, and acellular pertussis (Tdap, Td) vaccine. You may need a Td booster every 10 years.  Zoster  vaccine. You may need this after age 11.  Pneumococcal 13-valent conjugate (PCV13) vaccine. One dose is recommended after age 28.  Pneumococcal polysaccharide (PPSV23) vaccine. One dose is recommended after age 81. Talk to your health care provider about which screenings and vaccines you need and how often you need them. This information is not intended to replace advice given to you by your health care provider. Make sure you discuss any questions you have with your health care provider. Document Released: 01/26/2015 Document Revised: 09/19/2015 Document Reviewed: 10/31/2014 Elsevier Interactive Patient Education  2017 [place] Prevention in the Home Falls can cause injuries. They can happen to people of all ages. There are many things you can do to make your home safe and to help prevent falls. What can I do on the outside of my home?  Regularly fix the edges of walkways and driveways and fix any cracks.  Remove anything that might make you trip as you walk through a door, such as a raised step or threshold.  Trim any bushes or trees on the path to your home.  Use bright outdoor lighting.  Clear any walking paths of anything that might make someone trip, such as rocks or tools.  Regularly check to see if handrails are loose or broken. Make sure that both sides of any steps have handrails.  Any raised decks and porches should have guardrails on the edges.  Have any leaves, snow, or ice cleared regularly.  Use sand or salt on walking paths during winter.  Clean up any spills in your garage right away. This includes oil or grease spills. What can I do in the bathroom?  Use night lights.  Install grab bars by the toilet and in the tub and shower. Do not use towel bars as grab bars.  Use non-skid mats or decals in the tub or shower.  If you need to sit down in the shower, use a plastic, non-slip stool.  Keep the floor dry. Clean up any water that spills on the  floor as soon as it happens.  Remove soap buildup in the tub or shower regularly.  Attach bath mats securely with double-sided non-slip rug tape.  Do not have throw rugs and other things on the floor that can make you trip. What can I do in the bedroom?  Use night lights.  Make sure that you have a light by your bed that is easy to reach.  Do not use any sheets or blankets that are too big for your bed. They should not hang down onto the floor.  Have a firm chair that has side arms. You can use this for support while you get dressed.  Do not have throw rugs and other things on the floor that can make you trip. What can I do in the kitchen?  Clean up any spills right away.  Avoid walking on wet floors.  Keep items that you use a lot in easy-to-reach places.  If you need to reach something above you, use a strong step stool that has a grab bar.  Keep electrical cords out of the way.  Do not use floor polish or wax that makes floors slippery. If you must use wax, use non-skid floor wax.  Do not have throw rugs and other things on  the floor that can make you trip. What can I do with my stairs?  Do not leave any items on the stairs.  Make sure that there are handrails on both sides of the stairs and use them. Fix handrails that are broken or loose. Make sure that handrails are as long as the stairways.  Check any carpeting to make sure that it is firmly attached to the stairs. Fix any carpet that is loose or worn.  Avoid having throw rugs at the top or bottom of the stairs. If you do have throw rugs, attach them to the floor with carpet tape.  Make sure that you have a light switch at the top of the stairs and the bottom of the stairs. If you do not have them, ask someone to add them for you. What else can I do to help prevent falls?  Wear shoes that:  Do not have high heels.  Have rubber bottoms.  Are comfortable and fit you well.  Are closed at the toe. Do not wear  sandals.  If you use a stepladder:  Make sure that it is fully opened. Do not climb a closed stepladder.  Make sure that both sides of the stepladder are locked into place.  Ask someone to hold it for you, if possible.  Clearly mark and make sure that you can see:  Any grab bars or handrails.  First and last steps.  Where the edge of each step is.  Use tools that help you move around (mobility aids) if they are needed. These include:  Canes.  Walkers.  Scooters.  Crutches.  Turn on the lights when you go into a dark area. Replace any light bulbs as soon as they burn out.  Set up your furniture so you have a clear path. Avoid moving your furniture around.  If any of your floors are uneven, fix them.  If there are any pets around you, be aware of where they are.  Review your medicines with your doctor. Some medicines can make you feel dizzy. This can increase your chance of falling. Ask your doctor what other things that you can do to help prevent falls. This information is not intended to replace advice given to you by your health care provider. Make sure you discuss any questions you have with your health care provider. Document Released: 10/26/2008 Document Revised: 06/07/2015 Document Reviewed: 02/03/2014 Elsevier Interactive Patient Education  2017 [employer].

## 2020-05-02 NOTE — Progress Notes (Signed)
Subjective:   Lauren [name] is a 65 y.o. female who presents for Medicare Annual (Subsequent) preventive examination.  I connected with [place] today by telephone and verified that I am speaking with the correct person using two identifiers. Location patient: home Location provider: work Persons participating in the virtual visit: patient, Marine scientist.    I discussed the limitations, risks, security and privacy concerns of performing an evaluation and management service by telephone and the availability of in person appointments. I also discussed with the patient that there may be a patient responsible charge related to this service. The patient expressed understanding and verbally consented to this telephonic visit.    Interactive audio and video telecommunications were attempted between this provider and patient, however failed, due to patient having technical difficulties OR patient did not have access to video capability.  We continued and completed visit with audio only.  Some vital signs may be absent or patient reported.   Time Spent with patient on telephone encounter: 20 minutes   Review of Systems     Cardiac Risk Factors include: advanced age (>29men, >61 women);dyslipidemia     Objective:    Today's Vitals   05/02/20 0859  Weight: 124 lb (56.2 kg)  Height: 5\' 4"  (1.626 m)   Body mass index is 21.28 kg/m.  Advanced Directives 05/02/2020 11/21/2019 11/21/2019 04/07/2019  Does Patient Have a Medical Advance Directive? Yes Yes No Yes  Type of Paramedic of [place];Living will [place];Living will - [place];Living will  Does patient want to make changes to medical advance directive? - No - Patient declined - No - Patient declined  Copy of [place] in Chart? No - copy requested No - copy requested - No - copy requested  Would patient like information on creating a medical advance directive? -  No - Patient declined No - Patient declined -    Current Medications (verified) Outpatient Encounter Medications as of 05/02/2020  Medication Sig  . albendazole (ALBENZA) 200 MG tablet Take 2 tablets (400 mg total) by mouth once a week.  Marland Kitchen omeprazole (PRILOSEC) 20 MG capsule Take 1 capsule (20 mg total) by mouth daily.  . rosuvastatin (CRESTOR) 5 MG tablet Take 1 tablet (5 mg total) by mouth daily.  Marland Kitchen venlafaxine XR (EFFEXOR-XR) 150 MG 24 hr capsule Take 1 capsule (150 mg total) by mouth daily with breakfast.  . [DISCONTINUED] gabapentin (NEURONTIN) 300 MG capsule Take 1 capsule (300 mg total) by mouth at bedtime.   No facility-administered encounter medications on file as of 05/02/2020.    Allergies (verified) Penicillins   History: Past Medical History:  Diagnosis Date  . 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 Chicken pox as a child  . Depression    been on antidepressants on and off for 20 yr  . GERD (gastroesophageal reflux disease)    silent with hiatal hernia  . Hiatal hernia 05/22/2013  . Hiatal hernia with gastroesophageal reflux 05/22/2013  . Hot tub folliculitis 62/03/7626  . Hyperglycemia 05/19/2016  . Hyperlipidemia 05/19/2016  . Measles as a child  . Postmenopausal atrophic vaginitis 05/22/2013  . Preventative health care 01/19/2015  . Shingles 65 yrs old  . Unspecified vitamin D deficiency 05/22/2013   Past Surgical History:  Procedure Laterality Date  . ABDOMINAL HYSTERECTOMY  1998   partial-still has ovaries  . BILIARY STENT PLACEMENT N/A 11/23/2019   Procedure: BILIARY STENT PLACEMENT;  Surgeon: Carol Ada,  MD;  Location: [facility];  Service: Endoscopy;  Laterality: N/A;  . ERCP N/A 11/23/2019   Procedure: ENDOSCOPIC RETROGRADE CHOLANGIOPANCREATOGRAPHY (ERCP);  Surgeon: Carol Ada, MD;  Location: [facility];  Service: Endoscopy;  Laterality: N/A;  . ESOPHAGECTOMY  05-2008  . INDUCED ABORTION  1979  . REMOVAL OF STONES  11/23/2019   Procedure: REMOVAL  OF STONES;  Surgeon: Carol Ada, MD;  Location: [facility];  Service: Endoscopy;;  . [name]  11/23/2019   Procedure: [name];  Surgeon: Carol Ada, MD;  Location: [facility];  Service: Endoscopy;;   Family History  Problem Relation Age of Onset  . Cancer Mother 85       breast cancer  . Emphysema Mother        smoker  . Heart attack Father        X several  . Mental illness Brother        bipolar, xanax and thc abuse  . Obesity Brother   . Depression Daughter   . Anxiety disorder Daughter   . Other Maternal Grandmother        blood clot, dvt, lung  . Cancer Paternal Grandmother        stomach  . Heart disease Paternal Grandfather    Social History   Socioeconomic History  . Marital status: Married    Spouse name: [name]  . Number of children: Not on file  . Years of education: Not on file  . Highest education level: Not on file  Occupational History    Employer: [employer]    Comment: Event Planner  Tobacco Use  . Smoking status: Former Smoker    Packs/day: 0.10    Years: 25.00    Pack years: 2.50    Types: Cigarettes    Start date: 01/13/1998  . Smokeless tobacco: Never Used  . Tobacco comment: 1-2 cigarettes a day  Vaping Use  . Vaping Use: Never used  Substance and Sexual Activity  . Alcohol use: Yes    Comment: a couple glasses of wine every other day  . Drug use: No  . Sexual activity: Yes    Partners: Male    Comment: lives with husband, event planner, no dietary restrictions  Other Topics Concern  . 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 Difficulty of Paying Living Expenses: Not hard at all  Food Insecurity: No Food Insecurity  . Worried About Charity fundraiser in the Last Year: Never true  . Ran Out of Food in the Last Year: Never true  Transportation Needs: No Transportation Needs  . Lack of  Transportation (Medical): No  . Lack of Transportation (Non-Medical): No  Physical Activity: Sufficiently Active  . Days of Exercise per Week: 3 days  . Minutes of Exercise per Session: 50 min  Stress: No Stress Concern Present  . Feeling of Stress : Not at all  Social Connections: Moderately Integrated  . Frequency of Communication with Friends and Family: More than three times a week  . Frequency of Social Gatherings with Friends and Family: More than three times a week  . Attends Religious Services: Never  . Active Member of Clubs or Organizations: Yes  . Attends Archivist Meetings: More than 4 times per year  . Marital Status: Married    Tobacco Counseling Counseling given: Not Answered Comment: 1-2 cigarettes  a day   Clinical Intake:  Pre-visit preparation completed: Yes  Pain : No/denies pain     Nutritional Status: BMI of 19-24  Normal Nutritional Risks: None Diabetes: No  How often do you need to have someone help you when you read instructions, pamphlets, or other written materials from your doctor or pharmacy?: 1 - Never  Diabetic?No  Interpreter Needed?: No  Information entered by :: Caroleen Hamman LPN   Activities of Daily Living In your present state of health, do you have any difficulty performing the following activities: 05/02/2020 11/21/2019  Hearing? N -  Vision? N -  Difficulty concentrating or making decisions? Y -  Comment occasionally -  Walking or climbing stairs? N -  Dressing or bathing? N -  Doing errands, shopping? N N  Preparing Food and eating ? N -  Using the Toilet? N -  In the past six months, have you accidently leaked urine? N -  Do you have problems with loss of bowel control? N -  Managing your Medications? N -  Managing your Finances? N -  Housekeeping or managing your Housekeeping? N -  Some recent data might be hidden    Patient Care Team: [name], MD as PCP - General (Family Medicine)  Indicate  any recent Medical Services you may have received from other than [facility] providers in the past year (date may be approximate).     Assessment:   This is a routine wellness examination for Jadie.  Hearing/Vision screen  Hearing Screening   125Hz  250Hz  500Hz  1000Hz  2000Hz  3000Hz  4000Hz  6000Hz  8000Hz   Right ear:           Left ear:           Comments: No issues  Vision Screening Comments: Last eye exam-never  Dietary issues and exercise activities discussed: Current Exercise Habits: Home exercise routine, Type of exercise: strength training/weights, Time (Minutes): 40, Frequency (Times/Week): 3, Weekly Exercise (Minutes/Week): 120, Intensity: Mild, Exercise limited by: None identified  Goals    . Patient Stated     Drink at 3 16oz bottles of water.     . Patient Stated     Exercise 3x per week      Depression Screen PHQ 2/9 Scores 05/02/2020 04/12/2020 04/11/2019 04/07/2019  PHQ - 2 Score 0 0 0 0  PHQ- 9 Score - 0 0 -    Fall Risk Fall Risk  05/02/2020 04/12/2020 04/07/2019  Falls in the past year? 1 1 0  Number falls in past yr: 0 0 0  Injury with Fall? 0 1 0  Follow up Falls prevention discussed - Education provided;Falls prevention discussed    FALL RISK PREVENTION PERTAINING TO THE HOME:  Any stairs in or around the home? Yes  If so, are there any without handrails? No  Home free of loose throw rugs in walkways, pet beds, electrical cords, etc? Yes  Adequate lighting in your home to reduce risk of falls? Yes   ASSISTIVE DEVICES UTILIZED TO PREVENT FALLS:  Life alert? No  Use of a cane, walker or w/c? No  Grab bars in the bathroom? No  Shower chair or bench in shower? No  Elevated toilet seat or a handicapped toilet? No   TIMED UP AND GO:  Was the test performed? No . Phone visit   Cognitive Function:Normal cognitive status assessed by  this Nurse Health Advisor. No abnormalities found.       6CIT Screen 05/02/2020  What Year? 0  points  What month? 0 points   What time? 0 points  Count back from 20 0 points  Months in reverse 0 points  Repeat phrase 0 points  Total Score 0    Immunizations Immunization History  Administered Date(s) Administered  . Influenza, High Dose Seasonal PF 09/27/2018, 09/26/2019  . Influenza,inj,Quad PF,6+ Mos 11/08/2014, 10/20/2017  . Influenza-Unspecified 11/17/2013  . PFIZER(Purple Top)SARS-COV-2 Vaccination 02/18/2019, 03/15/2019, 09/14/2019, 05/01/2020  . Pneumococcal Polysaccharide-23 11/22/2019  . Td 01/14/2003  . Tdap 01/13/2010, 06/28/2010, 11/08/2014  . Zoster 05/02/2013  . Zoster Recombinat (Shingrix) 10/11/2018, 08/05/2019    TDAP status: Up to date  Flu Vaccine status: Up to date  Pneumococcal vaccine status: Up to date  Covid-19 vaccine status: Completed vaccines  Qualifies for Shingles Vaccine? No   Zostavax completed Yes   Shingrix Completed?: Yes  Screening Tests Health Maintenance  Topic Date Due  . INFLUENZA VACCINE  08/13/2020  . MAMMOGRAM  10/10/2020  . PNA vac Low Risk Adult (2 of 2 - PCV13) 11/21/2020  . TETANUS/TDAP  11/07/2024  . COLONOSCOPY (Pts 45-67yrs Insurance coverage will need to be confirmed)  02/21/2025  . DEXA SCAN  Completed  . COVID-19 Vaccine  Completed  . Hepatitis C Screening  Completed  . HPV VACCINES  Aged Out    Health Maintenance  There are no preventive care reminders to display for this patient.  Colorectal cancer screening: Type of screening: Colonoscopy. Completed 02/22/2015. Repeat every 10 years  Mammogram status: Ordered today. Pt provided with contact info and advised to call to schedule appt.   Bone Density status: Ordered today. Pt provided with contact info and advised to call to schedule appt.  Lung Cancer Screening: (Low Dose CT Chest recommended if Age 70-80 years, 30 pack-year currently smoking OR have quit w/in 15years.) does not qualify.    Additional Screening:  Hepatitis C Screening:Completed 11/22/2019  Vision Screening:  Recommended annual ophthalmology exams for early detection of glaucoma and other disorders of the eye. Is the patient up to date with their annual eye exam?  No  Who is the provider or what is the name of the office in which the patient attends annual eye exams? Patient not established with an eye doctor yet but will make an appt soon.   Dental Screening: Recommended annual dental exams for proper oral hygiene  Community Resource Referral / Chronic Care Management: CRR required this visit?  No   CCM required this visit?  No      Plan:     I have personally reviewed and noted the following in the patient's chart:   . Medical and social history . Use of alcohol, tobacco or illicit drugs  . Current medications and supplements . Functional ability and status . Nutritional status . Physical activity . Advanced directives . List of other physicians . Hospitalizations, surgeries, and ER visits in previous 12 months . Vitals . Screenings to include cognitive, depression, and falls . Referrals and appointments  In addition, I have reviewed and discussed with patient certain preventive protocols, quality metrics, and best practice recommendations. A written personalized care plan for preventive services as well as general preventive health recommendations were provided to patient.   Due to this being a telephonic visit, the after visit summary with patients personalized plan was offered to patient via mail or my-chart.Patient would like to access on my-chart.    Marta Antu, LPN   [id number]  Nurse Health Advisor  Nurse Notes: None

## 2020-05-29 NOTE — Progress Notes (Signed)
Attempted to obtain medical history via telephone, unable to reach at this time. I left a voicemail to return pre surgical testing department's phone call.  

## 2020-05-31 NOTE — Anesthesia Preprocedure Evaluation (Addendum)
Anesthesia Evaluation  Patient identified by MRN, date of birth, ID band Patient awake    Reviewed: Allergy & Precautions, NPO status , Patient's Chart, lab work & pertinent test results  Airway Mallampati: I  TM Distance: >3 FB Neck ROM: Full    Dental no notable dental hx. (+) Teeth Intact, Dental Advisory Given   Pulmonary former smoker,    Pulmonary exam normal breath sounds clear to auscultation       Cardiovascular Exercise Tolerance: Good Normal cardiovascular exam Rhythm:Regular Rate:Normal     Neuro/Psych Depression  Neuromuscular disease    GI/Hepatic hiatal hernia, GERD  ,Esophageal CA HX   Endo/Other    Renal/GU      Musculoskeletal negative musculoskeletal ROS (+)   Abdominal Normal abdominal exam  (+)   Peds  Hematology Lab Results      Component                Value               Date                      WBC                      6.6                 04/12/2020                HGB                      14.1                04/12/2020                HCT                      43.1                04/12/2020                MCV                      95.1                04/12/2020                PLT                      347.0               04/12/2020              Anesthesia Other Findings   Reproductive/Obstetrics                           Anesthesia Physical Anesthesia Plan  ASA: III  Anesthesia Plan: General   Post-op Pain Management:    Induction: Intravenous  PONV Risk Score and Plan: 3 and Treatment may vary due to age or medical condition, Midazolam and Ondansetron  Airway Management Planned: Oral ETT  Additional Equipment: None  Intra-op Plan:   Post-operative Plan: Extubation in OR  Informed Consent: I have reviewed the patients History and Physical, chart, labs and discussed the procedure including the risks, benefits and alternatives for the proposed anesthesia  with the patient or authorized representative who has indicated his/her understanding and  acceptance.     Dental advisory given  Plan Discussed with: CRNA  Anesthesia Plan Comments:        Anesthesia Quick Evaluation

## 2020-06-01 ENCOUNTER — Encounter (HOSPITAL_COMMUNITY): Payer: Self-pay | Admitting: Gastroenterology

## 2020-06-01 ENCOUNTER — Ambulatory Visit (HOSPITAL_COMMUNITY): Payer: Medicare HMO

## 2020-06-01 ENCOUNTER — Ambulatory Visit (HOSPITAL_COMMUNITY): Payer: Medicare HMO | Admitting: Anesthesiology

## 2020-06-01 ENCOUNTER — Encounter (HOSPITAL_COMMUNITY)
Admission: RE | Disposition: A | Discharge status: Home / Self Care | Payer: Self-pay | Source: Home / Self Care | Attending: Gastroenterology

## 2020-06-01 ENCOUNTER — Other Ambulatory Visit: Payer: Self-pay

## 2020-06-01 ENCOUNTER — Ambulatory Visit (HOSPITAL_COMMUNITY)
Admission: RE | Admit: 2020-06-01 | Discharge: 2020-06-01 | Disposition: A | Discharge status: Home / Self Care | Payer: Medicare HMO | Attending: Gastroenterology | Admitting: Gastroenterology

## 2020-06-01 DIAGNOSIS — Z4659 Encounter for fitting and adjustment of other gastrointestinal appliance and device: Secondary | ICD-10-CM | POA: Insufficient documentation

## 2020-06-01 DIAGNOSIS — Z880 Allergy status to penicillin: Secondary | ICD-10-CM | POA: Diagnosis not present

## 2020-06-01 DIAGNOSIS — E559 Vitamin D deficiency, unspecified: Secondary | ICD-10-CM | POA: Diagnosis not present

## 2020-06-01 DIAGNOSIS — K8051 Calculus of bile duct without cholangitis or cholecystitis with obstruction: Secondary | ICD-10-CM | POA: Diagnosis not present

## 2020-06-01 DIAGNOSIS — Z87891 Personal history of nicotine dependence: Secondary | ICD-10-CM | POA: Diagnosis not present

## 2020-06-01 DIAGNOSIS — E785 Hyperlipidemia, unspecified: Secondary | ICD-10-CM | POA: Diagnosis not present

## 2020-06-01 DIAGNOSIS — K8050 Calculus of bile duct without cholangitis or cholecystitis without obstruction: Secondary | ICD-10-CM | POA: Diagnosis not present

## 2020-06-01 DIAGNOSIS — Z8501 Personal history of malignant neoplasm of esophagus: Secondary | ICD-10-CM | POA: Diagnosis not present

## 2020-06-01 DIAGNOSIS — Z9889 Other specified postprocedural states: Secondary | ICD-10-CM

## 2020-06-01 DIAGNOSIS — Z48815 Encounter for surgical aftercare following surgery on the digestive system: Secondary | ICD-10-CM | POA: Diagnosis not present

## 2020-06-01 DIAGNOSIS — R932 Abnormal findings on diagnostic imaging of liver and biliary tract: Secondary | ICD-10-CM | POA: Diagnosis not present

## 2020-06-01 HISTORY — PX: STENT REMOVAL: SHX6421

## 2020-06-01 HISTORY — PX: ENDOSCOPIC RETROGRADE CHOLANGIOPANCREATOGRAPHY (ERCP) WITH PROPOFOL: SHX5810

## 2020-06-01 SURGERY — ENDOSCOPIC RETROGRADE CHOLANGIOPANCREATOGRAPHY (ERCP) WITH PROPOFOL
Anesthesia: General

## 2020-06-01 MED ORDER — MIDAZOLAM HCL 2 MG/2ML IJ SOLN
INTRAMUSCULAR | Status: AC
  Filled 2020-06-01: qty 2

## 2020-06-01 MED ORDER — EPHEDRINE SULFATE 50 MG/ML IJ SOLN
INTRAMUSCULAR | Status: DC | PRN
  Administered 2020-06-01: 7 mg via INTRAVENOUS
  Administered 2020-06-01 (×2): 10 mg via INTRAVENOUS

## 2020-06-01 MED ORDER — PROPOFOL 10 MG/ML IV BOLUS
INTRAVENOUS | Status: AC
  Filled 2020-06-01: qty 20

## 2020-06-01 MED ORDER — FENTANYL CITRATE (PF) 100 MCG/2ML IJ SOLN
INTRAMUSCULAR | Status: AC
  Filled 2020-06-01: qty 2

## 2020-06-01 MED ORDER — SODIUM CHLORIDE 0.9 % IV SOLN
INTRAVENOUS | Status: DC | PRN
  Administered 2020-06-01: 20 mL

## 2020-06-01 MED ORDER — LACTATED RINGERS IV SOLN: INTRAVENOUS | Status: DC | PRN

## 2020-06-01 MED ORDER — PHENYLEPHRINE HCL (PRESSORS) 10 MG/ML IV SOLN
INTRAVENOUS | Status: DC | PRN
  Administered 2020-06-01: 100 ug via INTRAVENOUS
  Administered 2020-06-01: 80 ug via INTRAVENOUS

## 2020-06-01 MED ORDER — INDOMETHACIN 50 MG RE SUPP
RECTAL | Status: AC
  Filled 2020-06-01: qty 2

## 2020-06-01 MED ORDER — CIPROFLOXACIN IN D5W 400 MG/200ML IV SOLN
INTRAVENOUS | Status: AC
  Filled 2020-06-01: qty 200

## 2020-06-01 MED ORDER — LIDOCAINE HCL (CARDIAC) PF 100 MG/5ML IV SOSY
PREFILLED_SYRINGE | INTRAVENOUS | Status: DC | PRN
  Administered 2020-06-01: 100 mg via INTRAVENOUS

## 2020-06-01 MED ORDER — SODIUM CHLORIDE 0.9 % IV SOLN: INTRAVENOUS | Status: DC

## 2020-06-01 MED ORDER — LACTATED RINGERS IV SOLN: Freq: Once | INTRAVENOUS | Status: AC

## 2020-06-01 MED ORDER — FENTANYL CITRATE (PF) 100 MCG/2ML IJ SOLN
INTRAMUSCULAR | Status: DC | PRN
  Administered 2020-06-01: 100 ug via INTRAVENOUS

## 2020-06-01 MED ORDER — PROPOFOL 10 MG/ML IV BOLUS
INTRAVENOUS | Status: DC | PRN
  Administered 2020-06-01: 150 mg via INTRAVENOUS

## 2020-06-01 MED ORDER — SUGAMMADEX SODIUM 200 MG/2ML IV SOLN
INTRAVENOUS | Status: DC | PRN
  Administered 2020-06-01: 150 mg via INTRAVENOUS

## 2020-06-01 MED ORDER — ONDANSETRON HCL 4 MG/2ML IJ SOLN
INTRAMUSCULAR | Status: DC | PRN
  Administered 2020-06-01: 4 mg via INTRAVENOUS

## 2020-06-01 MED ORDER — ROCURONIUM BROMIDE 100 MG/10ML IV SOLN
INTRAVENOUS | Status: DC | PRN
  Administered 2020-06-01: 60 mg via INTRAVENOUS

## 2020-06-01 MED ORDER — GLUCAGON HCL RDNA (DIAGNOSTIC) 1 MG IJ SOLR
INTRAMUSCULAR | Status: AC
  Filled 2020-06-01: qty 1

## 2020-06-01 MED ORDER — MIDAZOLAM HCL 5 MG/5ML IJ SOLN
INTRAMUSCULAR | Status: DC | PRN
  Administered 2020-06-01 (×2): 1 mg via INTRAVENOUS

## 2020-06-01 NOTE — Anesthesia Procedure Notes (Signed)
Procedure Name: Intubation Date/Time: 06/01/2020 10:36 AM Performed by: Garrel Ridgel, CRNA Pre-anesthesia Checklist: Patient identified, Emergency Drugs available, Suction available and Patient being monitored Patient Re-evaluated:Patient Re-evaluated prior to induction Oxygen Delivery Method: Circle system utilized Preoxygenation: Pre-oxygenation with 100% oxygen Induction Type: IV induction Ventilation: Mask ventilation without difficulty Laryngoscope Size: Mac and 3 Grade View: Grade II Tube type: Oral Tube size: 7.0 mm Number of attempts: 1 Airway Equipment and Method: Stylet and Oral airway Placement Confirmation: ETT inserted through vocal cords under direct vision,  positive ETCO2 and breath sounds checked- equal and bilateral Secured at: 21 cm Tube secured with: Tape Dental Injury: Teeth and Oropharynx as per pre-operative assessment

## 2020-06-01 NOTE — Anesthesia Postprocedure Evaluation (Signed)
Anesthesia Post Note  Patient: Lauren [name]  Procedure(s) Performed: ENDOSCOPIC RETROGRADE CHOLANGIOPANCREATOGRAPHY (ERCP) WITH PROPOFOL (N/A ) STENT REMOVAL     Patient location during evaluation: Endoscopy Anesthesia Type: General Level of consciousness: awake Pain management: pain level controlled Vital Signs Assessment: post-procedure vital signs reviewed and stable Respiratory status: spontaneous breathing Cardiovascular status: stable Postop Assessment: no apparent nausea or vomiting Anesthetic complications: no   No complications documented.  Last Vitals:  Vitals:   06/01/20 1140 06/01/20 1150  BP: (!) 166/81 (!) 145/62  Pulse: 88 86  Resp: 17 17  Temp: 37.1 C   SpO2: 100% 100%    Last Pain:  Vitals:   06/01/20 1150  TempSrc:   PainSc: 0-No pain                 Huston Foley

## 2020-06-01 NOTE — Discharge Instructions (Signed)

## 2020-06-01 NOTE — Transfer of Care (Signed)
Immediate Anesthesia Transfer of Care Note  Patient: Lauren [name]  Procedure(s) Performed: ENDOSCOPIC RETROGRADE CHOLANGIOPANCREATOGRAPHY (ERCP) WITH PROPOFOL (N/A ) STENT REMOVAL  Patient Location: PACU and Endoscopy Unit  Anesthesia Type:MAC and General  Level of Consciousness: awake, alert , oriented and patient cooperative  Airway & Oxygen Therapy: Patient Spontanous Breathing and Patient connected to face mask oxygen  Post-op Assessment: Report given to RN and Post -op Vital signs reviewed and stable  Post vital signs: Reviewed and stable  Last Vitals:  Vitals Value Taken Time  BP    Temp    Pulse    Resp    SpO2      Last Pain:  Vitals:   06/01/20 0933  TempSrc: Oral  PainSc: 0-No pain         Complications: No complications documented.

## 2020-06-01 NOTE — H&P (Signed)
  Lauren [name] HPI: The patient is s/p ERCP with stent placement.  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  She is here for follow up and stent removal.  Past Medical History:  Diagnosis Date  . 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 Chicken pox as a child  . Depression    been on antidepressants on and off for 20 yr  . GERD (gastroesophageal reflux disease)    silent with hiatal hernia  . Hiatal hernia 05/22/2013  . Hiatal hernia with gastroesophageal reflux 05/22/2013  . Hot tub folliculitis 41/09/3788  . Hyperglycemia 05/19/2016  . Hyperlipidemia 05/19/2016  . Measles as a child  . Postmenopausal atrophic vaginitis 05/22/2013  . Preventative health care 01/19/2015  . Shingles 65 yrs old  . Unspecified vitamin D deficiency 05/22/2013    Past Surgical History:  Procedure Laterality Date  . ABDOMINAL HYSTERECTOMY  1998   partial-still has ovaries  . BILIARY STENT PLACEMENT N/A 11/23/2019   Procedure: BILIARY STENT PLACEMENT;  Surgeon: Carol Ada, MD;  Location: [facility];  Service: Endoscopy;  Laterality: N/A;  . ERCP N/A 11/23/2019   Procedure: ENDOSCOPIC RETROGRADE CHOLANGIOPANCREATOGRAPHY (ERCP);  Surgeon: Carol Ada, MD;  Location: [facility];  Service: Endoscopy;  Laterality: N/A;  . ESOPHAGECTOMY  05-2008  . INDUCED ABORTION  1979  . REMOVAL OF STONES  11/23/2019   Procedure: REMOVAL OF STONES;  Surgeon: Carol Ada, MD;  Location: [facility];  Service: Endoscopy;;  . [name]  11/23/2019   Procedure: [name];  Surgeon: Carol Ada, MD;  Location: [facility];  Service: Endoscopy;;    Family History  Problem Relation Age of Onset  . Cancer Mother 69       breast cancer  . Emphysema Mother        smoker  . Heart attack Father        X several  . Mental illness Brother        bipolar, xanax and thc abuse  . Obesity Brother   . Depression Daughter    . Anxiety disorder Daughter   . Other Maternal Grandmother        blood clot, dvt, lung  . Cancer Paternal Grandmother        stomach  . Heart disease Paternal Grandfather     Social History:  reports that she has quit smoking. Her smoking use included cigarettes. She started smoking about 22 years ago. She has a 2.50 pack-year smoking history. She has never used smokeless tobacco. She reports current alcohol use. She reports that she does not use drugs.  Allergies:  Allergies  Allergen Reactions  . Penicillins Hives and Nausea And Vomiting    PCN - Hives when she was 14 Amoxicillin - vomiting and chills (11/22/19)      Medications:  Scheduled:  Continuous: . sodium chloride      No results found for this or any previous visit (from the past 24 hour(s)).   No results found.  ROS:  As stated above in the HPI otherwise negative.  There were no vitals taken for this visit.    PE: Gen: NAD, Alert and Oriented HEENT:  [place]/AT, EOMI Neck: Supple, no LAD Lungs: CTA Bilaterally CV: RRR without M/G/R ABD: Soft, NTND, +BS Ext: No C/C/E  Assessment/Plan: 1) Biliary stent  - ERCP with stent removal.  Lauren [name] D 06/01/2020, 9:25 AM

## 2020-06-01 NOTE — Op Note (Signed)
[facility] Patient Name: Lauren [name] Procedure Date: 06/01/2020 MRN: [id number] Attending MD: Jeani Hawking , MD Date of Birth: 1955/10/10 CSN: [id number] Age: 65 Admit Type: Outpatient Procedure:                ERCP Indications:              Stent removal Providers:                Jeani Hawking, MD, Clearnce Sorrel, RN, Lawson Radar,                            Technician, Marlena Clipper, CRNA Referring MD:              Medicines:                General Anesthesia Complications:            No immediate complications. Estimated Blood Loss:     Estimated blood loss: none. Procedure:                Pre-Anesthesia Assessment:                           - Prior to the procedure, a History and Physical                            was performed, and patient medications and                            allergies were reviewed. The patient's tolerance of                            previous anesthesia was also reviewed. The risks                            and benefits of the procedure and the sedation                            options and risks were discussed with the patient.                            All questions were answered, and informed consent                            was obtained. Prior Anticoagulants: The patient has                            taken no previous anticoagulant or antiplatelet                            agents. ASA Grade Assessment: II - A patient with                            mild systemic disease. After reviewing the risks                            and  benefits, the patient was deemed in                            satisfactory condition to undergo the procedure.                           - Sedation was administered by an anesthesia                            professional. General anesthesia was attained.                           After obtaining informed consent, the scope was                            passed under direct vision. Throughout the                             procedure, the patient's blood pressure, pulse, and                            oxygen saturations were monitored continuously. The                            Olympus [id number]-Q180V [id number]) was introduced through                            the mouth, and used to inject contrast into and                            used to inject contrast into the bile duct. The                            [id number]-[id number] ([id number]) Olympus gastroscope was                            introduced through the and used to inject contrast                            into. The ERCP was technically difficult and                            complex. The patient tolerated the procedure well. Scope In: Scope Out: Findings:      Revolution wire was passed into the biliary tree. One stent was removed       from the biliary tree using a snare. To discover objects, the biliary       tree was swept with a 12 mm balloon starting at the bifurcation. Nothing       was found.      There was some initial difficulty with cannulating using the 0.035       Jagwire. The wire was being passed along side the stent, but the wire       continued to coil in the diverticulum. The Revolution wire was employed  and the CBD was able to be cannulated and the wire was secured in the       right intrahepatic ducts. The stent was then removed with a snare.       Contrast injection into the CBD showed that the CBD remained dilated.       There was no evidence of any filling defects. The dilation diameter was       approximately 12-14 mm and there was no change in comparision to the       11/2019 images. The CBD was swept 3-5 times to ensure that no small       stones were retained. No stones were found. Impression:               - One stent was removed from the biliary tree.                           - The biliary tree was swept and nothing was found. Moderate Sedation:      Not Applicable - Patient had care per  Anesthesia. Recommendation:           - Patient has a contact number available for                            emergencies. The signs and symptoms of potential                            delayed complications were discussed with the                            patient. Return to normal activities tomorrow.                            Written discharge instructions were provided to the                            patient.                           - Resume regular diet.                           - Check liver enzymes in one month. Procedure Code(s):        --- Professional ---                           [phone], Endoscopic retrograde                            cholangiopancreatography (ERCP); with removal of                            foreign body(s) or stent(s) from biliary/pancreatic                            duct(s) Diagnosis Code(s):        --- Professional ---  Z46.59, Encounter for fitting and adjustment of                            other gastrointestinal appliance and device CPT copyright 2019 [employer]. All rights reserved. The codes documented in this report are preliminary and upon coder review may  be revised to meet current compliance requirements. Carol Ada, MD Carol Ada, MD 06/01/2020 11:37:05 AM This report has been signed electronically. Number of Addenda: 0

## 2020-06-04 ENCOUNTER — Encounter (HOSPITAL_COMMUNITY): Payer: Self-pay | Admitting: Gastroenterology

## 2020-06-12 DIAGNOSIS — L309 Dermatitis, unspecified: Secondary | ICD-10-CM | POA: Diagnosis not present

## 2020-06-12 DIAGNOSIS — L820 Inflamed seborrheic keratosis: Secondary | ICD-10-CM | POA: Diagnosis not present

## 2020-06-12 DIAGNOSIS — D0359 Melanoma in situ of other part of trunk: Secondary | ICD-10-CM | POA: Diagnosis not present

## 2020-07-02 ENCOUNTER — Other Ambulatory Visit: Payer: Self-pay | Admitting: Family Medicine

## 2020-07-16 ENCOUNTER — Other Ambulatory Visit: Payer: Self-pay | Admitting: Family Medicine

## 2020-07-17 DIAGNOSIS — L988 Other specified disorders of the skin and subcutaneous tissue: Secondary | ICD-10-CM | POA: Diagnosis not present

## 2020-07-17 DIAGNOSIS — D0359 Melanoma in situ of other part of trunk: Secondary | ICD-10-CM | POA: Diagnosis not present

## 2020-10-12 ENCOUNTER — Other Ambulatory Visit: Payer: Self-pay | Admitting: Family Medicine

## 2020-10-12 DIAGNOSIS — K219 Gastro-esophageal reflux disease without esophagitis: Secondary | ICD-10-CM

## 2020-10-15 ENCOUNTER — Ambulatory Visit (INDEPENDENT_AMBULATORY_CARE_PROVIDER_SITE_OTHER): Payer: Medicare HMO | Admitting: Family Medicine

## 2020-10-15 ENCOUNTER — Ambulatory Visit (HOSPITAL_BASED_OUTPATIENT_CLINIC_OR_DEPARTMENT_OTHER): Payer: Medicare HMO

## 2020-10-15 ENCOUNTER — Encounter: Payer: Self-pay | Admitting: Family Medicine

## 2020-10-15 ENCOUNTER — Other Ambulatory Visit: Payer: Self-pay

## 2020-10-15 VITALS — BP 112/72 | HR 71 | Temp 98.1°F | Resp 16 | Wt 123.2 lb

## 2020-10-15 DIAGNOSIS — Z23 Encounter for immunization: Secondary | ICD-10-CM

## 2020-10-15 DIAGNOSIS — R739 Hyperglycemia, unspecified: Secondary | ICD-10-CM

## 2020-10-15 DIAGNOSIS — Z1231 Encounter for screening mammogram for malignant neoplasm of breast: Secondary | ICD-10-CM | POA: Diagnosis not present

## 2020-10-15 DIAGNOSIS — E785 Hyperlipidemia, unspecified: Secondary | ICD-10-CM | POA: Diagnosis not present

## 2020-10-15 DIAGNOSIS — K449 Diaphragmatic hernia without obstruction or gangrene: Secondary | ICD-10-CM | POA: Diagnosis not present

## 2020-10-15 DIAGNOSIS — R413 Other amnesia: Secondary | ICD-10-CM | POA: Diagnosis not present

## 2020-10-15 DIAGNOSIS — E559 Vitamin D deficiency, unspecified: Secondary | ICD-10-CM | POA: Diagnosis not present

## 2020-10-15 DIAGNOSIS — K219 Gastro-esophageal reflux disease without esophagitis: Secondary | ICD-10-CM

## 2020-10-15 LAB — COMPREHENSIVE METABOLIC PANEL
ALT: 20 U/L (ref 0–35)
AST: 24 U/L (ref 0–37)
Albumin: 4.2 g/dL (ref 3.5–5.2)
Alkaline Phosphatase: 83 U/L (ref 39–117)
BUN: 14 mg/dL (ref 6–23)
CO2: 31 mEq/L (ref 19–32)
Calcium: 9.9 mg/dL (ref 8.4–10.5)
Chloride: 100 mEq/L (ref 96–112)
Creatinine, Ser: 0.72 mg/dL (ref 0.40–1.20)
GFR: 86.36 mL/min (ref >60.00)
Glucose, Bld: 86 mg/dL (ref 70–99)
Potassium: 5.4 mEq/L — ABNORMAL HIGH (ref 3.5–5.1)
Sodium: 139 mEq/L (ref 135–145)
Total Bilirubin: 0.4 mg/dL (ref 0.2–1.2)
Total Protein: 6.9 g/dL (ref 6.0–8.3)

## 2020-10-15 LAB — CBC WITH DIFFERENTIAL/PLATELET
Basophils Absolute: 0.1 10*3/uL (ref 0.0–0.1)
Basophils Relative: 1.1 % (ref 0.0–3.0)
Eosinophils Absolute: 0.1 10*3/uL (ref 0.0–0.7)
Eosinophils Relative: 1.9 % (ref 0.0–5.0)
HCT: 40.3 % (ref 36.0–46.0)
Hemoglobin: 13.1 g/dL (ref 12.0–15.0)
Lymphocytes Relative: 26 % (ref 12.0–46.0)
Lymphs Abs: 1.4 10*3/uL (ref 0.7–4.0)
MCHC: 32.6 g/dL (ref 30.0–36.0)
MCV: 96.8 fl (ref 78.0–100.0)
Monocytes Absolute: 0.5 10*3/uL (ref 0.1–1.0)
Monocytes Relative: 8.6 % (ref 3.0–12.0)
Neutro Abs: 3.5 10*3/uL (ref 1.4–7.7)
Neutrophils Relative %: 62.4 % (ref 43.0–77.0)
Platelets: 376 10*3/uL (ref 150.0–400.0)
RBC: 4.17 Mil/uL (ref 3.87–5.11)
RDW: 13.9 % (ref 11.5–15.5)
WBC: 5.6 10*3/uL (ref 4.0–10.5)

## 2020-10-15 LAB — VITAMIN D 25 HYDROXY (VIT D DEFICIENCY, FRACTURES): VITD: 31.85 ng/mL (ref 30.00–100.00)

## 2020-10-15 LAB — LIPID PANEL
Cholesterol: 177 mg/dL (ref 0–200)
HDL: 66.3 mg/dL (ref >39.00)
LDL Cholesterol: 94 mg/dL (ref 0–99)
NonHDL: 110.95
Total CHOL/HDL Ratio: 3
Triglycerides: 86 mg/dL (ref 0.0–149.0)
VLDL: 17.2 mg/dL (ref 0.0–40.0)

## 2020-10-15 LAB — HEMOGLOBIN A1C: Hgb A1c MFr Bld: 6.3 % (ref 4.6–6.5)

## 2020-10-15 LAB — TSH: TSH: 1.87 u[IU]/mL (ref 0.35–5.50)

## 2020-10-15 NOTE — Patient Instructions (Addendum)
Lauren [name] has a good multivitamin with minerals, fatty acid supplements either fish or krill or flaxseed oil caps, Vitamin D3 1000 IU daily  Probiotic daily, multistrain ECASA 81 mg daily    Paxlovid and Molnupiravir are the new COVID medication we can give you if you get COVID so make sure you test if you have symptoms because we have to treat by day 5 of symptoms for it to be effective. If you are positive let us know so we can treat. If a home test is negative and your symptoms are persistent get a PCR test. Can check testing locations at [email or web address] If you are positive we will make an appointment with [place] and we will send in Paxlovid or Molnupiravir if you would like it. Check with your pharmacy before we meet to confirm they have it in stock, if they do not then we can get the prescription at the [facility]

## 2020-10-15 NOTE — Progress Notes (Signed)
Subjective:   By signing my name below, I, Zite Okoli, attest that this documentation has been prepared under the direction and in the presence of [name], MD. 10/15/2020   Patient ID: Lauren [name], female    DOB: Sep 01, 1955, 65 y.o.   MRN: [id number]  Chief Complaint  Patient presents with   Follow-up    HPI Patient is in today for an office visit  She reports she has been losing her memory gradually and believes it is due to old age, menopause, wine and chemotherapy. She forgets names and recent conversations but still remembers big housekeeping and distant events. She denies falls and head traumas.  She had Covid-19 in July 2022 while on vacation and reports there has ben no residual symptoms.  She reports she has been managing he heartburn with 20 mg omeprazole and is doing well on it. She has had no recent episodes.  She has 5 Pfizer Covid-19 vaccines at this time. She is UTD on shingles and tetanus vaccines.She is getting the flu vaccine at this visit.   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   Chicken pox as a child   Depression    been on antidepressants on and off for 20 yr   GERD (gastroesophageal reflux disease)    silent with hiatal hernia   Hiatal hernia 05/22/2013   Hiatal hernia with gastroesophageal reflux 7/37/3668   Hot tub folliculitis 15/09/4705   Hyperglycemia 05/19/2016   Hyperlipidemia 05/19/2016   Measles as a child   Postmenopausal atrophic vaginitis 05/22/2013   Preventative health care 01/19/2015   Shingles 65 yrs old   Unspecified vitamin D deficiency 05/22/2013    Past Surgical History:  Procedure Laterality Date   ABDOMINAL HYSTERECTOMY  1998   partial-still has ovaries   BILIARY STENT PLACEMENT N/A 11/23/2019   Procedure: BILIARY STENT PLACEMENT;  Surgeon: Carol Ada, MD;  Location: [facility];  Service: Endoscopy;  Laterality: N/A;   ENDOSCOPIC RETROGRADE CHOLANGIOPANCREATOGRAPHY (ERCP)  WITH PROPOFOL N/A 06/01/2020   Procedure: ENDOSCOPIC RETROGRADE CHOLANGIOPANCREATOGRAPHY (ERCP) WITH PROPOFOL;  Surgeon: Carol Ada, MD;  Location: [facility];  Service: Endoscopy;  Laterality: N/A;   ERCP N/A 11/23/2019   Procedure: ENDOSCOPIC RETROGRADE CHOLANGIOPANCREATOGRAPHY (ERCP);  Surgeon: Carol Ada, MD;  Location: [facility];  Service: Endoscopy;  Laterality: N/A;   ESOPHAGECTOMY  05-2008   INDUCED ABORTION  1979   REMOVAL OF STONES  11/23/2019   Procedure: REMOVAL OF STONES;  Surgeon: Carol Ada, MD;  Location: [facility];  Service: Endoscopy;;   SPHINCTEROTOMY  11/23/2019   Procedure: [name];  Surgeon: Carol Ada, MD;  Location: [facility];  Service: Endoscopy;;   STENT REMOVAL  06/01/2020   Procedure: STENT REMOVAL;  Surgeon: Carol Ada, MD;  Location: [facility];  Service: Endoscopy;;    Family History  Problem Relation Age of Onset   Cancer Mother 57       breast cancer   Emphysema Mother        smoker   Heart attack Father        X several   Mental illness Brother        bipolar, xanax and thc abuse   Obesity Brother    Depression Daughter    Anxiety disorder Daughter    Other Maternal Grandmother        blood clot, dvt, lung   Cancer Paternal Grandmother        stomach   Heart  disease Paternal Grandfather     Social History   Socioeconomic History   Marital status: Married    Spouse name: [name]   Number of children: Not on file   Years of education: Not on file   Highest education level: Not on file  Occupational History    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  Tobacco Use   Smoking status: Former    Packs/day: 0.10    Years: 25.00    Pack years: 2.50    Types: Cigarettes    Start date: 01/13/1998   Smokeless tobacco: Never   Tobacco comments:    1-2 cigarettes a day  Vaping Use   Vaping Use: Never used  Substance and Sexual Activity   Alcohol use: Yes    Comment: a couple glasses of wine  every other day   Drug use: No   Sexual activity: Yes    Partners: Male    Comment: lives with husband, event planner, no dietary restrictions  Other Topics Concern   Not on file  Social History Narrative   Married, husband Education officer, museum   Social Determinants of Health   Financial Resource Strain: Low Risk    Difficulty of Paying Living Expenses: Not hard at all  Food Insecurity: No Food Insecurity   Worried About Charity fundraiser in the Last Year: Never true   Arboriculturist in the Last Year: Never true  Transportation Needs: No Transportation Needs   Lack of Transportation (Medical): No   Lack of Transportation (Non-Medical): No  Physical Activity: Sufficiently Active   Days of Exercise per Week: 3 days   Minutes of Exercise per Session: 50 min  Stress: No Stress Concern Present   Feeling of Stress : Not at all  Social Connections: Moderately Integrated   Frequency of Communication with Friends and Family: More than three times a week   Frequency of Social Gatherings with Friends and Family: More than three times a week   Attends Religious Services: Never   Marine scientist or Organizations: Yes   Attends Music therapist: More than 4 times per year   Marital Status: Married  Human resources officer Violence: Not At Risk   Fear of Current or Ex-Partner: No   Emotionally Abused: No   Physically Abused: No   Sexually Abused: No    Outpatient Medications Prior to Visit  Medication Sig Dispense Refill   omeprazole (PRILOSEC) 20 MG capsule TAKE 1 CAPSULE BY MOUTH EVERY DAY 90 capsule 1   Probiotic Product (PROBIOTIC ADVANCED) CAPS Take 4 tablets by mouth daily.     rosuvastatin (CRESTOR) 5 MG tablet TAKE 1 TABLET BY MOUTH EVERY DAY 90 tablet 1   venlafaxine XR (EFFEXOR-XR) 150 MG 24 hr capsule TAKE 1 CAPSULE BY MOUTH EVERY DAY WITH BREAKFAST 90 capsule 3   No facility-administered medications prior to visit.    Allergies  Allergen Reactions    Penicillins Hives and Nausea And Vomiting    PCN - Hives when she was 14 Amoxicillin - vomiting and chills (11/22/19)      Review of Systems  Constitutional:  Negative for fever and malaise/fatigue.  HENT:  Negative for congestion.   Eyes:  Negative for redness.  Respiratory:  Negative for shortness of breath.   Cardiovascular:  Negative for chest pain, palpitations and leg swelling.  Gastrointestinal:  Negative for abdominal pain, blood in stool and nausea.  Genitourinary:  Negative for  dysuria and frequency.  Musculoskeletal:  Negative for falls.  Skin:  Negative for rash.  Neurological:  Negative for dizziness, loss of consciousness and headaches.  Endo/Heme/Allergies:  Negative for polydipsia.  Psychiatric/Behavioral:  Positive for memory loss. Negative for depression. The patient is not nervous/anxious.       Objective:    Physical Exam Constitutional:      General: She is not in acute distress.    Appearance: She is well-developed.  HENT:     Head: Normocephalic and atraumatic.  Eyes:     Conjunctiva/sclera: Conjunctivae normal.  Neck:     Thyroid: No thyromegaly.  Cardiovascular:     Rate and Rhythm: Normal rate and regular rhythm.     Heart sounds: Normal heart sounds. No murmur heard. Pulmonary:     Effort: Pulmonary effort is normal. No respiratory distress.     Breath sounds: Normal breath sounds.  Abdominal:     General: Bowel sounds are normal. There is no distension.     Palpations: Abdomen is soft. There is no mass.     Tenderness: There is no abdominal tenderness.  Musculoskeletal:     Cervical back: Neck supple.  Lymphadenopathy:     Cervical: No cervical adenopathy.  Skin:    General: Skin is warm and dry.  Neurological:     Mental Status: She is alert and oriented to person, place, and time.  Psychiatric:        Behavior: Behavior normal.    BP 112/72   Pulse 71   Temp 98.1 F (36.7 C)   Resp 16   Wt 123 lb 3.2 oz (55.9 kg)   SpO2 98%    BMI 21.15 kg/m  Wt Readings from Last 3 Encounters:  10/15/20 123 lb 3.2 oz (55.9 kg)  06/01/20 125 lb (56.7 kg)  05/02/20 124 lb (56.2 kg)    Diabetic Foot Exam - Simple   No data filed    Lab Results  Component Value Date   WBC 5.6 10/15/2020   HGB 13.1 10/15/2020   HCT 40.3 10/15/2020   PLT 376.0 10/15/2020   GLUCOSE 86 10/15/2020   CHOL 177 10/15/2020   TRIG 86.0 10/15/2020   HDL 66.30 10/15/2020   LDLCALC 94 10/15/2020   ALT 20 10/15/2020   AST 24 10/15/2020   NA 139 10/15/2020   K 5.4 No hemolysis seen (H) 10/15/2020   CL 100 10/15/2020   CREATININE 0.72 10/15/2020   BUN 14 10/15/2020   CO2 31 10/15/2020   TSH 1.87 10/15/2020   INR 1.0 05/04/2008   HGBA1C 6.3 10/15/2020    Lab Results  Component Value Date   TSH 1.87 10/15/2020   Lab Results  Component Value Date   WBC 5.6 10/15/2020   HGB 13.1 10/15/2020   HCT 40.3 10/15/2020   MCV 96.8 10/15/2020   PLT 376.0 10/15/2020   Lab Results  Component Value Date   NA 139 10/15/2020   K 5.4 No hemolysis seen (H) 10/15/2020   CO2 31 10/15/2020   GLUCOSE 86 10/15/2020   BUN 14 10/15/2020   CREATININE 0.72 10/15/2020   BILITOT 0.4 10/15/2020   ALKPHOS 83 10/15/2020   AST 24 10/15/2020   ALT 20 10/15/2020   PROT 6.9 10/15/2020   ALBUMIN 4.2 10/15/2020   CALCIUM 9.9 10/15/2020   ANIONGAP 8 11/24/2019   GFR 86.36 10/15/2020   Lab Results  Component Value Date   CHOL 177 10/15/2020   Lab Results  Component Value Date  HDL 66.30 10/15/2020   Lab Results  Component Value Date   LDLCALC 94 10/15/2020   Lab Results  Component Value Date   TRIG 86.0 10/15/2020   Lab Results  Component Value Date   CHOLHDL 3 10/15/2020   Lab Results  Component Value Date   HGBA1C 6.3 10/15/2020       Assessment & Plan:   Problem List Items Addressed This Visit     Hiatal hernia with gastroesophageal reflux   Vitamin D deficiency    Supplement and monitor      Relevant Orders   VITAMIN D 25  Hydroxy (Vit-D Deficiency, Fractures) (Completed)   Hyperglycemia    hgba1c acceptable, minimize simple carbs. Increase exercise as tolerated.       Relevant Orders   Hemoglobin A1c (Completed)   Hyperlipidemia    Encourage heart healthy diet such as MIND or DASH diet, increase exercise, avoid trans fats, simple carbohydrates and processed foods, consider a krill or fish or flaxseed oil cap daily.       Relevant Orders   Comprehensive metabolic panel (Completed)   CBC with Differential/Platelet (Completed)   Lipid panel (Completed)   Memory changes    She notes worsening trouble with short term memory. She notes she has started repeating herself more. She is still performing her ADLs just fine and her long term memory is intact. She is encouraged to minimize carbohydrates and processed foods, increase exercise and hydration. Add a MVI and fatty acids and learn something new. referred to neuropsychology for in depth testing.       Relevant Orders   Ambulatory referral to Neuropsychology   TSH (Completed)   Other Visit Diagnoses     Need for influenza vaccination    -  Primary   Relevant Orders   Flu Vaccine QUAD High Dose(Fluad) (Completed)   Encounter for screening mammogram for malignant neoplasm of breast       Relevant Orders   MM 3D SCREEN BREAST BILATERAL       No orders of the defined types were placed in this encounter.   I,Zite Okoli,acting as a Education administrator for Penni Homans, MD.,have documented all relevant documentation on the behalf of Penni Homans, MD,as directed by  Penni Homans, MD while in the presence of Penni Homans, MD.   I, [name], MD., personally preformed the services described in this documentation.  All medical record entries made by the scribe were at my direction and in my presence.  I have reviewed the chart and discharge instructions (if applicable) and agree that the record reflects my personal performance and is accurate and complete.  10/15/2020

## 2020-10-15 NOTE — Assessment & Plan Note (Signed)
She notes worsening trouble with short term memory. She notes she has started repeating herself more. She is still performing her ADLs just fine and her long term memory is intact. She is encouraged to minimize carbohydrates and processed foods, increase exercise and hydration. Add a MVI and fatty acids and learn something new. referred to neuropsychology for in depth testing.

## 2020-10-15 NOTE — Assessment & Plan Note (Signed)
Encourage heart healthy diet such as MIND or DASH diet, increase exercise, avoid trans fats, simple carbohydrates and processed foods, consider a krill or fish or flaxseed oil cap daily.  °

## 2020-10-15 NOTE — Assessment & Plan Note (Signed)
Supplement and monitor 

## 2020-10-15 NOTE — Assessment & Plan Note (Signed)
hgba1c acceptable, minimize simple carbs. Increase exercise as tolerated.  

## 2020-10-17 ENCOUNTER — Encounter: Payer: Self-pay | Admitting: Psychology

## 2020-10-17 ENCOUNTER — Other Ambulatory Visit: Payer: Self-pay

## 2020-10-17 DIAGNOSIS — E875 Hyperkalemia: Secondary | ICD-10-CM

## 2020-10-24 ENCOUNTER — Telehealth: Payer: Self-pay | Admitting: Family Medicine

## 2020-10-24 NOTE — Addendum Note (Signed)
Addended by: Randolm Idol A on: 10/24/2020 03:40 PM   Modules accepted: Orders

## 2020-10-24 NOTE — Telephone Encounter (Signed)
Pt would like to know what could cause her potassium to be high. Would it truly change in a week if she is not doing anything to lower? Does she need to do anything to lower?

## 2020-10-24 NOTE — Telephone Encounter (Signed)
Pt is aware.  

## 2020-10-24 NOTE — Telephone Encounter (Signed)
Patient called regarding labs. She was advised that labs needed to be repeated and wanted to speak with someone regarding results before rescheduling an appointment. Please advise.

## 2020-10-29 ENCOUNTER — Other Ambulatory Visit (INDEPENDENT_AMBULATORY_CARE_PROVIDER_SITE_OTHER): Payer: Medicare HMO

## 2020-10-29 DIAGNOSIS — E875 Hyperkalemia: Secondary | ICD-10-CM | POA: Diagnosis not present

## 2020-10-29 LAB — COMPREHENSIVE METABOLIC PANEL
ALT: 19 U/L (ref 0–35)
AST: 22 U/L (ref 0–37)
Albumin: 4.3 g/dL (ref 3.5–5.2)
Alkaline Phosphatase: 71 U/L (ref 39–117)
BUN: 16 mg/dL (ref 6–23)
CO2: 28 mEq/L (ref 19–32)
Calcium: 9.9 mg/dL (ref 8.4–10.5)
Chloride: 104 mEq/L (ref 96–112)
Creatinine, Ser: 0.77 mg/dL (ref 0.40–1.20)
GFR: 79.66 mL/min (ref >60.00)
Glucose, Bld: 102 mg/dL — ABNORMAL HIGH (ref 70–99)
Potassium: 4.1 mEq/L (ref 3.5–5.1)
Sodium: 139 mEq/L (ref 135–145)
Total Bilirubin: 0.4 mg/dL (ref 0.2–1.2)
Total Protein: 7 g/dL (ref 6.0–8.3)

## 2020-11-12 ENCOUNTER — Encounter (HOSPITAL_BASED_OUTPATIENT_CLINIC_OR_DEPARTMENT_OTHER): Payer: Self-pay

## 2020-11-12 ENCOUNTER — Ambulatory Visit (HOSPITAL_BASED_OUTPATIENT_CLINIC_OR_DEPARTMENT_OTHER)
Admission: RE | Admit: 2020-11-12 | Discharge: 2020-11-12 | Disposition: A | Discharge status: Home / Self Care | Payer: Medicare HMO | Source: Ambulatory Visit | Attending: Family Medicine | Admitting: Family Medicine

## 2020-11-12 ENCOUNTER — Other Ambulatory Visit: Payer: Self-pay

## 2020-11-12 DIAGNOSIS — Z1231 Encounter for screening mammogram for malignant neoplasm of breast: Secondary | ICD-10-CM | POA: Diagnosis not present

## 2020-11-26 ENCOUNTER — Telehealth: Payer: Self-pay | Admitting: Family Medicine

## 2020-11-26 NOTE — Telephone Encounter (Signed)
Lvm let her know that pt had a mammogram in Oct. 2022

## 2020-11-26 NOTE — Telephone Encounter (Signed)
Care management stated pt has not had a breast cancer screening, and will be needing one.

## 2021-01-04 ENCOUNTER — Other Ambulatory Visit: Payer: Self-pay | Admitting: Family Medicine

## 2021-01-28 DIAGNOSIS — L821 Other seborrheic keratosis: Secondary | ICD-10-CM | POA: Diagnosis not present

## 2021-01-28 DIAGNOSIS — D225 Melanocytic nevi of trunk: Secondary | ICD-10-CM | POA: Diagnosis not present

## 2021-01-28 DIAGNOSIS — Z85820 Personal history of malignant melanoma of skin: Secondary | ICD-10-CM | POA: Diagnosis not present

## 2021-01-28 DIAGNOSIS — L814 Other melanin hyperpigmentation: Secondary | ICD-10-CM | POA: Diagnosis not present

## 2021-01-28 DIAGNOSIS — M720 Palmar fascial fibromatosis [Dupuytren]: Secondary | ICD-10-CM | POA: Diagnosis not present

## 2021-01-28 DIAGNOSIS — D2272 Melanocytic nevi of left lower limb, including hip: Secondary | ICD-10-CM | POA: Diagnosis not present

## 2021-01-28 DIAGNOSIS — Z85828 Personal history of other malignant neoplasm of skin: Secondary | ICD-10-CM | POA: Diagnosis not present

## 2021-02-28 ENCOUNTER — Encounter: Payer: Medicare HMO | Admitting: Psychology

## 2021-03-07 ENCOUNTER — Encounter: Payer: Medicare HMO | Admitting: Psychology

## 2021-04-10 ENCOUNTER — Other Ambulatory Visit: Payer: Self-pay | Admitting: Family Medicine

## 2021-04-10 DIAGNOSIS — K449 Diaphragmatic hernia without obstruction or gangrene: Secondary | ICD-10-CM

## 2021-05-19 DIAGNOSIS — J069 Acute upper respiratory infection, unspecified: Secondary | ICD-10-CM | POA: Diagnosis not present

## 2021-06-05 ENCOUNTER — Ambulatory Visit (INDEPENDENT_AMBULATORY_CARE_PROVIDER_SITE_OTHER): Payer: Medicare HMO

## 2021-06-05 DIAGNOSIS — Z0000 Encounter for general adult medical examination without abnormal findings: Secondary | ICD-10-CM

## 2021-06-05 NOTE — Patient Instructions (Signed)
[name] , Thank you for taking time to come for your Medicare Wellness Visit. I appreciate your ongoing commitment to your health goals. Please review the following plan we discussed and let me know if I can assist you in the future.   Screening recommendations/referrals: Colonoscopy: 02/22/15 due 02/21/25 Mammogram: 11/12/20 due 11/12/21 Bone Density: declined Recommended yearly ophthalmology/optometry visit for glaucoma screening and checkup Recommended yearly dental visit for hygiene and checkup  Vaccinations: Influenza vaccine: up to date Pneumococcal vaccine: Due-May obtain vaccine at your local pharmacy.  Tdap vaccine: up to date Shingles vaccine: up to date   Covid-19:completed  Advanced directives: yes, not on file  Conditions/risks identified: see problem list  Next appointment: Follow up in one year for your annual wellness visit    Preventive Care 68 Years and Older, Female Preventive care refers to lifestyle choices and visits with your health care provider that can promote health and wellness. What does preventive care include? A yearly physical exam. This is also called an annual well check. Dental exams once or twice a year. Routine eye exams. Ask your health care provider how often you should have your eyes checked. Personal lifestyle choices, including: Daily care of your teeth and gums. Regular physical activity. Eating a healthy diet. Avoiding tobacco and drug use. Limiting alcohol use. Practicing safe sex. Taking low-dose aspirin every day. Taking vitamin and mineral supplements as recommended by your health care provider. What happens during an annual well check? The services and screenings done by your health care provider during your annual well check will depend on your age, overall health, lifestyle risk factors, and family history of disease. Counseling  Your health care provider may ask you questions about your: Alcohol use. Tobacco use. Drug  use. Emotional well-being. Home and relationship well-being. Sexual activity. Eating habits. History of falls. Memory and ability to understand (cognition). Work and work Statistician. Reproductive health. Screening  You may have the following tests or measurements: Height, weight, and BMI. Blood pressure. Lipid and cholesterol levels. These may be checked every 5 years, or more frequently if you are over 53 years old. Skin check. Lung cancer screening. You may have this screening every year starting at age 38 if you have a 30-pack-year history of smoking and currently smoke or have quit within the past 15 years. Fecal occult blood test (FOBT) of the stool. You may have this test every year starting at age 71. Flexible sigmoidoscopy or colonoscopy. You may have a sigmoidoscopy every 5 years or a colonoscopy every 10 years starting at age 8. Hepatitis C blood test. Hepatitis B blood test. Sexually transmitted disease (STD) testing. Diabetes screening. This is done by checking your blood sugar (glucose) after you have not eaten for a while (fasting). You may have this done every 1-3 years. Bone density scan. This is done to screen for osteoporosis. You may have this done starting at age 22. Mammogram. This may be done every 1-2 years. Talk to your health care provider about how often you should have regular mammograms. Talk with your health care provider about your test results, treatment options, and if necessary, the need for more tests. Vaccines  Your health care provider may recommend certain vaccines, such as: Influenza vaccine. This is recommended every year. Tetanus, diphtheria, and acellular pertussis (Tdap, Td) vaccine. You may need a Td booster every 10 years. Zoster vaccine. You may need this after age 50. Pneumococcal 13-valent conjugate (PCV13) vaccine. One dose is recommended after age 61. Pneumococcal  polysaccharide (PPSV23) vaccine. One dose is recommended after age  24. Talk to your health care provider about which screenings and vaccines you need and how often you need them. This information is not intended to replace advice given to you by your health care provider. Make sure you discuss any questions you have with your health care provider. Document Released: 01/26/2015 Document Revised: 09/19/2015 Document Reviewed: 10/31/2014 Elsevier Interactive Patient Education  2017 [employer] Prevention in the Home Falls can cause injuries. They can happen to people of all ages. There are many things you can do to make your home safe and to help prevent falls. What can I do on the outside of my home? Regularly fix the edges of walkways and driveways and fix any cracks. Remove anything that might make you trip as you walk through a door, such as a raised step or threshold. Trim any bushes or trees on the path to your home. Use bright outdoor lighting. Clear any walking paths of anything that might make someone trip, such as rocks or tools. Regularly check to see if handrails are loose or broken. Make sure that both sides of any steps have handrails. Any raised decks and porches should have guardrails on the edges. Have any leaves, snow, or ice cleared regularly. Use sand or salt on walking paths during winter. Clean up any spills in your garage right away. This includes oil or grease spills. What can I do in the bathroom? Use night lights. Install grab bars by the toilet and in the tub and shower. Do not use towel bars as grab bars. Use non-skid mats or decals in the tub or shower. If you need to sit down in the shower, use a plastic, non-slip stool. Keep the floor dry. Clean up any water that spills on the floor as soon as it happens. Remove soap buildup in the tub or shower regularly. Attach bath mats securely with double-sided non-slip rug tape. Do not have throw rugs and other things on the floor that can make you trip. What can I do in the  bedroom? Use night lights. Make sure that you have a light by your bed that is easy to reach. Do not use any sheets or blankets that are too big for your bed. They should not hang down onto the floor. Have a firm chair that has side arms. You can use this for support while you get dressed. Do not have throw rugs and other things on the floor that can make you trip. What can I do in the kitchen? Clean up any spills right away. Avoid walking on wet floors. Keep items that you use a lot in easy-to-reach places. If you need to reach something above you, use a strong step stool that has a grab bar. Keep electrical cords out of the way. Do not use floor polish or wax that makes floors slippery. If you must use wax, use non-skid floor wax. Do not have throw rugs and other things on the floor that can make you trip. What can I do with my stairs? Do not leave any items on the stairs. Make sure that there are handrails on both sides of the stairs and use them. Fix handrails that are broken or loose. Make sure that handrails are as long as the stairways. Check any carpeting to make sure that it is firmly attached to the stairs. Fix any carpet that is loose or worn. Avoid having throw rugs at the top  or bottom of the stairs. If you do have throw rugs, attach them to the floor with carpet tape. Make sure that you have a light switch at the top of the stairs and the bottom of the stairs. If you do not have them, ask someone to add them for you. What else can I do to help prevent falls? Wear shoes that: Do not have high heels. Have rubber bottoms. Are comfortable and fit you well. Are closed at the toe. Do not wear sandals. If you use a stepladder: Make sure that it is fully opened. Do not climb a closed stepladder. Make sure that both sides of the stepladder are locked into place. Ask someone to hold it for you, if possible. Clearly mark and make sure that you can see: Any grab bars or  handrails. First and last steps. Where the edge of each step is. Use tools that help you move around (mobility aids) if they are needed. These include: Canes. Walkers. Scooters. Crutches. Turn on the lights when you go into a dark area. Replace any light bulbs as soon as they burn out. Set up your furniture so you have a clear path. Avoid moving your furniture around. If any of your floors are uneven, fix them. If there are any pets around you, be aware of where they are. Review your medicines with your doctor. Some medicines can make you feel dizzy. This can increase your chance of falling. Ask your doctor what other things that you can do to help prevent falls. This information is not intended to replace advice given to you by your health care provider. Make sure you discuss any questions you have with your health care provider. Document Released: 10/26/2008 Document Revised: 06/07/2015 Document Reviewed: 02/03/2014 Elsevier Interactive Patient Education  2017 [employer].

## 2021-06-05 NOTE — Progress Notes (Signed)
Subjective:   Lauren [name] is a 66 y.o. female who presents for Medicare Annual (Subsequent) preventive examination.  I connected with  Lauren [name] on 06/05/21 by a audio enabled telemedicine application and verified that I am speaking with the correct person using two identifiers.  Patient Location: Home  Provider Location: Office/Clinic  I discussed the limitations of evaluation and management by telemedicine. The patient expressed understanding and agreed to proceed.   Review of Systems     Cardiac Risk Factors include: advanced age (>39mn, >>64women);dyslipidemia     Objective:    There were no vitals filed for this visit. There is no height or weight on file to calculate BMI.     06/05/2021    1:42 PM 06/01/2020    9:35 AM 05/02/2020    9:03 AM 11/21/2019    3:13 PM 11/21/2019   10:52 AM 04/07/2019   11:03 AM  Advanced Directives  Does Patient Have a Medical Advance Directive? Yes Yes Yes Yes No Yes  Type of AParamed[place]of [place] of facility DNR (pink MOST or yellow form);Living will [place]Living will HG[place] CityLiving will [place]Living will  [place] will  Does patient want to make changes to medical advance directive? No - Patient declined   No - Patient declined  No - Patient declined  Copy of [place]oaring [place] Chart? No - copy requested Yes - validated most recent copy scanned in chart (See row information) No - copy requested No - copy requested  No - copy requested  Would patient like information on creating a medical advance directive?    No - Patient declined No - Patient declined     Current Medications (verified) Outpatient Encounter Medications as of 06/05/2021  Medication Sig   omeprazole (PRILOSEC) 20 MG capsule TAKE 1 CAPSULE BY MOUTH EVERY DAY   Probiotic Product (PROBIOTIC ADVANCED) CAPS Take 4 tablets by mouth  daily.   rosuvastatin (CRESTOR) 5 MG tablet TAKE 1 TABLET BY MOUTH EVERY DAY   venlafaxine XR (EFFEXOR-XR) 150 MG 24 hr capsule TAKE 1 CAPSULE BY MOUTH EVERY DAY WITH BREAKFAST   [DISCONTINUED] gabapentin (NEURONTIN) 300 MG capsule Take 1 capsule (300 mg total) by mouth at bedtime.   No facility-administered encounter medications on file as of 06/05/2021.    Allergies (verified) Penicillins   History: Past Medical History:  Diagnosis Date   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   Chicken pox as a child   Depression    been on antidepressants on and off for 20 yr   GERD (gastroesophageal reflux disease)    silent with hiatal hernia   Hiatal hernia 05/22/2013   Hiatal hernia with gastroesophageal reflux 51/97/5883  Hot tub folliculitis 125/04/9824  Hyperglycemia 05/19/2016   Hyperlipidemia 05/19/2016   Measles as a child   Postmenopausal atrophic vaginitis 05/22/2013   Preventative health care 01/19/2015   Shingles 66yrs old   Unspecified vitamin D deficiency 05/22/2013   Past Surgical History:  Procedure Laterality Date   ABDOMINAL HYSTERECTOMY  1998   partial-still has ovaries   BILIARY STENT PLACEMENT N/A 11/23/2019   Procedure: BILIARY STENT PLACEMENT;  Surgeon: HCarol Ada MD;  [facility]: [facility];  Service: Endoscopy;  Laterality: N/A;   ENDOSCOPIC RETROGRADE CHOLANGIOPANCREATOGRAPHY (ERCP) WITH PROPOFOL N/A 06/01/2020   Procedure: ENDOSCOPIC RETROGRADE CHOLANGIOPANCREATOGRAPHY (ERCP) WITH PROPOFOL;  Surgeon: HCarol Ada MD;  [facility];  Service: Endoscopy;  Laterality: N/A;   ERCP N/A 11/23/2019   Procedure: ENDOSCOPIC RETROGRADE CHOLANGIOPANCREATOGRAPHY (ERCP);  Surgeon: Carol Ada, MD;  [facility];  Service: Endoscopy;  Laterality: N/A;   ESOPHAGECTOMY  05-2008   INDUCED ABORTION  1979   REMOVAL OF STONES  11/23/2019   Procedure: REMOVAL OF STONES;  Surgeon: Carol Ada, MD;  [facility];  Service: Endoscopy;;   SPHINCTEROTOMY   11/23/2019   Procedure: [name];  Surgeon: Carol Ada, MD;  [facility];  Service: Endoscopy;;   STENT REMOVAL  06/01/2020   Procedure: STENT REMOVAL;  Surgeon: Carol Ada, MD;  [facility];  Service: Endoscopy;;   Family History  Problem Relation Age of Onset   Cancer Mother 71       breast cancer   Emphysema Mother        smoker   Heart attack Father        X several   Mental illness Brother        bipolar, xanax and thc abuse   Obesity Brother    Depression Daughter    Anxiety disorder Daughter    Other Maternal Grandmother        blood clot, dvt, lung   Cancer Paternal Grandmother        stomach   Heart disease Paternal Grandfather    Social History   Socioeconomic History   Marital status: Married    Spouse name: [name]   Number of children: Not on file   Years of education: Not on file   Highest education level: Not on file  Occupational History    Employer: [employer]    Comment: Event Planner  Tobacco Use   Smoking status: Former    Packs/day: 0.10    Years: 25.00    Pack years: 2.50    Types: Cigarettes    Start date: 01/13/1998   Smokeless tobacco: Never   Tobacco comments:    1-2 cigarettes a day  Vaping Use   Vaping Use: Never used  Substance and Sexual Activity   Alcohol use: Yes    Comment: a couple glasses of wine every other day   Drug use: No   Sexual activity: Yes    Partners: Male    Comment: lives with husband, event planner, no dietary restrictions  Other Topics Concern   Not on file  Social History Narrative   Married, husband Education officer, museum   Social Determinants of Health   Financial Resource Strain: Low Risk    Difficulty of Paying Living Expenses: Not hard at all  Food Insecurity: No Food Insecurity   Worried About Charity fundraiser in the Last Year: Never true   Arboriculturist in the Last Year: Never true  Transportation Needs: No Transportation Needs   Lack of  Transportation (Medical): No   Lack of Transportation (Non-Medical): No  Physical Activity: Insufficiently Active   Days of Exercise per Week: 3 days   Minutes of Exercise per Session: 40 min  Stress: No Stress Concern Present   Feeling of Stress : Not at all  Social Connections: Moderately Integrated   Frequency of Communication with Friends and Family: More than three times a week   Frequency of Social Gatherings with Friends and Family: More than three times a week   Attends Religious Services: Never   Marine scientist or Organizations: Yes   Attends Music therapist: More than 4 times per  year   Marital Status: Married    Tobacco Counseling Counseling given: Not Answered Tobacco comments: 1-2 cigarettes a day   Clinical Intake:  Pre-visit preparation completed: Yes        Nutritional Risks: None Diabetes: No  How often do you need to have someone help you when you read instructions, pamphlets, or other written materials from your doctor or pharmacy?: 1 - Never  Diabetic?no  Interpreter Needed?: No  Information entered by :: Ram Haugan   Activities of Daily Living    06/05/2021    1:43 PM  In your present state of health, do you have any difficulty performing the following activities:  Hearing? 0  Vision? 0  Difficulty concentrating or making decisions? 0  Walking or climbing stairs? 0  Dressing or bathing? 0  Doing errands, shopping? 0  Preparing Food and eating ? N  Using the Toilet? N  In the past six months, have you accidently leaked urine? N  Do you have problems with loss of bowel control? N  Managing your Medications? N  Managing your Finances? N  Housekeeping or managing your Housekeeping? N    Patient Care Team: [name], MD as PCP - General (Family Medicine)  Indicate any recent Medical Services you may have received from other than [facility] providers in the past year (date may be approximate).     Assessment:    This is a routine wellness examination for Lauren [name].  Hearing/Vision screen No results found.  Dietary issues and exercise activities discussed: Current Exercise Habits: Home exercise routine, Type of exercise: strength training/weights, Time (Minutes): 40, Frequency (Times/Week): 3, Weekly Exercise (Minutes/Week): 120, Intensity: Mild   Goals Addressed             This Visit's Progress    Patient Stated   On track    Drink at 3 16oz bottles of water.      Patient Stated   On track    Exercise 3x per week       Depression Screen    06/05/2021    1:43 PM 10/15/2020   10:34 AM 05/02/2020    9:06 AM 04/12/2020    9:04 AM 04/11/2019   11:45 AM 04/07/2019   11:18 AM  PHQ 2/9 Scores  PHQ - 2 Score 0 0 0 0 0 0  PHQ- 9 Score  2  0 0     Fall Risk    06/05/2021    1:42 PM 05/02/2020    9:04 AM 04/12/2020    9:04 AM 04/07/2019   11:18 AM  Fall Risk   Falls in the past year? 0 1 1 0  Number falls in past yr: 0 0 0 0  Injury with Fall? 0 0 1 0  Risk for fall due to : No Fall Risks     Follow up Falls evaluation completed Falls prevention discussed  Education provided;Falls prevention discussed    FALL RISK PREVENTION PERTAINING TO THE HOME:  Any stairs in or around the home? Yes  If so, are there any without handrails? No  Home free of loose throw rugs in walkways, pet beds, electrical cords, etc? Yes  Adequate lighting in your home to reduce risk of falls? Yes   ASSISTIVE DEVICES UTILIZED TO PREVENT FALLS:  Life alert? No  Use of a cane, walker or w/c? No  Grab bars in the bathroom? No  Shower chair or bench in shower? No  Elevated toilet seat or a handicapped toilet?  No   TIMED UP AND GO:  Was the test performed? No .    Cognitive Function:        06/05/2021    1:45 PM 05/02/2020    9:10 AM  6CIT Screen  What Year? 0 points 0 points  What month? 0 points 0 points  What time? 0 points 0 points  Count back from 20 0 points 0 points  Months in reverse 0  points 0 points  Repeat phrase 0 points 0 points  Total Score 0 points 0 points    Immunizations Immunization History  Administered Date(s) Administered   Fluad Quad(high Dose 65+) 10/15/2020   Influenza, High Dose Seasonal PF 09/27/2018, 09/26/2019   Influenza,inj,Quad PF,6+ Mos 11/08/2014, 10/20/2017   Influenza-Unspecified 11/17/2013   PFIZER Comirnaty(Gray Top)Covid-19 Tri-Sucrose Vaccine 05/01/2020   PFIZER(Purple Top)SARS-COV-2 Vaccination 02/18/2019, 03/15/2019, 09/14/2019, 05/01/2020   Pfizer Covid-19 Vaccine Bivalent Booster 19yr & up 10/10/2020   Pneumococcal Polysaccharide-23 11/22/2019   Td 01/14/2003   Tdap 01/13/2010, 06/28/2010, 11/08/2014   Zoster Recombinat (Shingrix) 10/11/2018, 08/05/2019   Zoster, Live 05/02/2013    TDAP status: Up to date  Flu Vaccine status: Up to date  Pneumococcal vaccine status: Due, Education has been provided regarding the importance of this vaccine. Advised may receive this vaccine at local pharmacy or Health Dept. Aware to provide a copy of the vaccination record if obtained from local pharmacy or Health Dept. Verbalized acceptance and understanding.  Covid-19 vaccine status: Completed vaccines  Qualifies for Shingles Vaccine? Yes   Zostavax completed No   Shingrix Completed?: Yes  Screening Tests Health Maintenance  Topic Date Due   Pneumonia Vaccine 68 Years old (2 - PCV) 11/21/2020   INFLUENZA VACCINE  08/13/2021   MAMMOGRAM  11/13/2022   TETANUS/TDAP  11/07/2024   COLONOSCOPY (Pts 45-416yrInsurance coverage will need to be confirmed)  02/21/2025   DEXA SCAN  Completed   COVID-19 Vaccine  Completed   Hepatitis C Screening  Completed   Zoster Vaccines- Shingrix  Completed   HPV VACCINES  Aged Out    Health Maintenance  Health Maintenance Due  Topic Date Due   Pneumonia Vaccine 6563Years old (2 - PCV) 11/21/2020    Colorectal cancer screening: Type of screening: Colonoscopy. Completed 02/22/15. Repeat every 10  years  Mammogram status: Completed 11/12/20. Repeat every year  Bone Density status: Ordered declined. Pt provided with contact info and advised to call to schedule appt.  Lung Cancer Screening: (Low Dose CT Chest recommended if Age 68-80ears, 30 pack-year currently smoking OR have quit w/in 15years.) does not qualify.   Lung Cancer Screening Referral: N/A  Additional Screening:  Hepatitis C Screening: does qualify; Completed 11/22/19  Vision Screening: Recommended annual ophthalmology exams for early detection of glaucoma and other disorders of the eye. Is the patient up to date with their annual eye exam?  No  Who is the provider or what is the name of the office in which the patient attends annual eye exams? N/A If pt is not established with a provider, would they like to be referred to a provider to establish care? No .   Dental Screening: Recommended annual dental exams for proper oral hygiene  Community Resource Referral / Chronic Care Management: CRR required this visit?  No   CCM required this visit?  No      Plan:     I have personally reviewed and noted the following in the patient's chart:   Medical and social history  Use of alcohol, tobacco or illicit drugs  Current medications and supplements including opioid prescriptions.  Functional ability and status Nutritional status Physical activity Advanced directives List of other physicians Hospitalizations, surgeries, and ER visits in previous 12 months Vitals Screenings to include cognitive, depression, and falls Referrals and appointments  In addition, I have reviewed and discussed with patient certain preventive protocols, quality metrics, and best practice recommendations. A written personalized care plan for preventive services as well as general preventive health recommendations were provided to patient.   Due to this being a telephonic visit, the after visit summary with patients personalized plan was  offered to patient via mail or my-chart.  Patient would like to access on my-chart.    Duard Brady Yexalen Deike, [place]   06/05/2021   Nurse Notes: none

## 2021-07-16 ENCOUNTER — Other Ambulatory Visit: Payer: Self-pay | Admitting: Family Medicine

## 2021-07-16 DIAGNOSIS — K219 Gastro-esophageal reflux disease without esophagitis: Secondary | ICD-10-CM

## 2021-07-24 NOTE — Progress Notes (Unsigned)
Subjective:    Patient ID: Lauren [name], female    DOB: 21-Jan-1955, 66 y.o.   MRN: [id number]  No chief complaint on file.   HPI Patient is in today for a follow up.   Past Medical History:  Diagnosis Date   Cancer [facility]) 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   Chicken pox as a child   Depression    been on antidepressants on and off for 20 yr   GERD (gastroesophageal reflux disease)    silent with hiatal hernia   Hiatal hernia 05/22/2013   Hiatal hernia with gastroesophageal reflux 5/37/4827   Hot tub folliculitis 07/20/6752   Hyperglycemia 05/19/2016   Hyperlipidemia 05/19/2016   Measles as a child   Postmenopausal atrophic vaginitis 05/22/2013   Preventative health care 01/19/2015   Shingles 66 yrs old   Unspecified vitamin D deficiency 05/22/2013    Past Surgical History:  Procedure Laterality Date   ABDOMINAL HYSTERECTOMY  1998   partial-still has ovaries   BILIARY STENT PLACEMENT N/A 11/23/2019   Procedure: BILIARY STENT PLACEMENT;  Surgeon: Carol Ada, MD;  [facility];  Service: Endoscopy;  Laterality: N/A;   ENDOSCOPIC RETROGRADE CHOLANGIOPANCREATOGRAPHY (ERCP) WITH PROPOFOL N/A 06/01/2020   Procedure: ENDOSCOPIC RETROGRADE CHOLANGIOPANCREATOGRAPHY (ERCP) WITH PROPOFOL;  Surgeon: Carol Ada, MD;  [facility];  Service: Endoscopy;  Laterality: N/A;   ERCP N/A 11/23/2019   Procedure: ENDOSCOPIC RETROGRADE CHOLANGIOPANCREATOGRAPHY (ERCP);  Surgeon: Carol Ada, MD;  [facility];  Service: Endoscopy;  Laterality: N/A;   ESOPHAGECTOMY  05-2008   INDUCED ABORTION  1979   REMOVAL OF STONES  11/23/2019   Procedure: REMOVAL OF STONES;  Surgeon: Carol Ada, MD;  [facility];  Service: Endoscopy;;   SPHINCTEROTOMY  11/23/2019   Procedure: [name];  Surgeon: Carol Ada, MD;  [facility];  Service: Endoscopy;;   STENT REMOVAL  06/01/2020   Procedure: STENT REMOVAL;  Surgeon: Carol Ada, MD;  [facility];   Service: Endoscopy;;    Family History  Problem Relation Age of Onset   Cancer Mother 10       breast cancer   Emphysema Mother        smoker   Heart attack Father        X several   Mental illness Brother        bipolar, xanax and thc abuse   Obesity Brother    Depression Daughter    Anxiety disorder Daughter    Other Maternal Grandmother        blood clot, dvt, lung   Cancer Paternal Grandmother        stomach   Heart disease Paternal Grandfather     Social History   Socioeconomic History   Marital status: Married    Spouse name: [name]   Number of children: Not on file   Years of education: Not on file   Highest education level: Not on file  Occupational History    Employer: [employer]    Comment: Event Planner  Tobacco Use   Smoking status: Former    Packs/day: 0.10    Years: 25.00    Total pack years: 2.50    Types: Cigarettes    Start date: 01/13/1998   Smokeless tobacco: Never   Tobacco comments:    1-2 cigarettes a day  Vaping Use   Vaping Use: Never used  Substance and Sexual Activity   Alcohol use: Yes    Comment: a couple glasses of wine every  other day   Drug use: No   Sexual activity: Yes    Partners: Male    Comment: lives with husband, event planner, no dietary restrictions  Other Topics Concern   Not on file  Social History Narrative   Married, husband Education officer, museum   Social Determinants of Health   Financial Resource Strain: Low Risk  (06/05/2021)   Overall Financial Resource Strain (CARDIA)    Difficulty of Paying Living Expenses: Not hard at all  Food Insecurity: No Food Insecurity (06/05/2021)   Hunger Vital Sign    Worried About Running Out of Food in the Last Year: Never true    Ran Out of Food in the Last Year: Never true  Transportation Needs: No Transportation Needs (06/05/2021)   PRAPARE - Hydrologist (Medical): No    Lack of Transportation (Non-Medical): No  Physical Activity:  Insufficiently Active (06/05/2021)   Exercise Vital Sign    Days of Exercise per Week: 3 days    Minutes of Exercise per Session: 40 min  Stress: No Stress Concern Present (06/05/2021)   [place]    Feeling of Stress : Not at all  Social Connections: Moderately Integrated (06/05/2021)   Social Connection and Isolation Panel [NHANES]    Frequency of Communication with Friends and Family: More than three times a week    Frequency of Social Gatherings with Friends and Family: More than three times a week    Attends Religious Services: Never    Marine scientist or Organizations: Yes    Attends Music therapist: More than 4 times per year    Marital Status: Married  Human resources officer Violence: Not At Risk (06/05/2021)   Humiliation, Afraid, Rape, and Kick questionnaire    Fear of Current or Ex-Partner: No    Emotionally Abused: No    Physically Abused: No    Sexually Abused: No    Outpatient Medications Prior to Visit  Medication Sig Dispense Refill   omeprazole (PRILOSEC) 20 MG capsule TAKE 1 CAPSULE BY MOUTH EVERY DAY 90 capsule 3   Probiotic Product (PROBIOTIC ADVANCED) CAPS Take 4 tablets by mouth daily.     rosuvastatin (CRESTOR) 5 MG tablet TAKE 1 TABLET BY MOUTH EVERY DAY 90 tablet 0   venlafaxine XR (EFFEXOR-XR) 150 MG 24 hr capsule TAKE 1 CAPSULE BY MOUTH EVERY DAY WITH BREAKFAST 90 capsule 3   No facility-administered medications prior to visit.    Allergies  Allergen Reactions   Penicillins Hives and Nausea And Vomiting    PCN - Hives when she was 14 Amoxicillin - vomiting and chills (11/22/19)      ROS     Objective:    Physical Exam  There were no vitals taken for this visit. Wt Readings from Last 3 Encounters:  10/15/20 123 lb 3.2 oz (55.9 kg)  06/01/20 125 lb (56.7 kg)  05/02/20 124 lb (56.2 kg)    Diabetic Foot Exam - Simple   No data filed    Lab Results  Component  Value Date   WBC 5.6 10/15/2020   HGB 13.1 10/15/2020   HCT 40.3 10/15/2020   PLT 376.0 10/15/2020   GLUCOSE 102 (H) 10/29/2020   CHOL 177 10/15/2020   TRIG 86.0 10/15/2020   HDL 66.30 10/15/2020   LDLCALC 94 10/15/2020   ALT 19 10/29/2020   AST 22 10/29/2020   NA 139 10/29/2020  K 4.1 10/29/2020   CL 104 10/29/2020   CREATININE 0.77 10/29/2020   BUN 16 10/29/2020   CO2 28 10/29/2020   TSH 1.87 10/15/2020   INR 1.0 05/04/2008   HGBA1C 6.3 10/15/2020    Lab Results  Component Value Date   TSH 1.87 10/15/2020   Lab Results  Component Value Date   WBC 5.6 10/15/2020   HGB 13.1 10/15/2020   HCT 40.3 10/15/2020   MCV 96.8 10/15/2020   PLT 376.0 10/15/2020   Lab Results  Component Value Date   NA 139 10/29/2020   K 4.1 10/29/2020   CO2 28 10/29/2020   GLUCOSE 102 (H) 10/29/2020   BUN 16 10/29/2020   CREATININE 0.77 10/29/2020   BILITOT 0.4 10/29/2020   ALKPHOS 71 10/29/2020   AST 22 10/29/2020   ALT 19 10/29/2020   PROT 7.0 10/29/2020   ALBUMIN 4.3 10/29/2020   CALCIUM 9.9 10/29/2020   ANIONGAP 8 11/24/2019   GFR 79.66 10/29/2020   Lab Results  Component Value Date   CHOL 177 10/15/2020   Lab Results  Component Value Date   HDL 66.30 10/15/2020   Lab Results  Component Value Date   LDLCALC 94 10/15/2020   Lab Results  Component Value Date   TRIG 86.0 10/15/2020   Lab Results  Component Value Date   CHOLHDL 3 10/15/2020   Lab Results  Component Value Date   HGBA1C 6.3 10/15/2020       Assessment & Plan:      Problem List Items Addressed This Visit   None   I am having Larena Glassman maintain her Probiotic Advanced, venlafaxine XR, rosuvastatin, and omeprazole.  No orders of the defined types were placed in this encounter.

## 2021-07-25 ENCOUNTER — Ambulatory Visit (INDEPENDENT_AMBULATORY_CARE_PROVIDER_SITE_OTHER): Payer: Medicare HMO | Admitting: Family Medicine

## 2021-07-25 ENCOUNTER — Encounter: Payer: Self-pay | Admitting: Family Medicine

## 2021-07-25 VITALS — BP 122/70 | HR 70 | Resp 20 | Ht 63.0 in | Wt 119.0 lb

## 2021-07-25 DIAGNOSIS — R413 Other amnesia: Secondary | ICD-10-CM

## 2021-07-25 DIAGNOSIS — E559 Vitamin D deficiency, unspecified: Secondary | ICD-10-CM | POA: Diagnosis not present

## 2021-07-25 DIAGNOSIS — E875 Hyperkalemia: Secondary | ICD-10-CM

## 2021-07-25 DIAGNOSIS — R748 Abnormal levels of other serum enzymes: Secondary | ICD-10-CM | POA: Insufficient documentation

## 2021-07-25 DIAGNOSIS — R739 Hyperglycemia, unspecified: Secondary | ICD-10-CM

## 2021-07-25 DIAGNOSIS — E785 Hyperlipidemia, unspecified: Secondary | ICD-10-CM | POA: Diagnosis not present

## 2021-07-25 LAB — CBC
HCT: 42.1 % (ref 36.0–46.0)
Hemoglobin: 13.7 g/dL (ref 12.0–15.0)
MCHC: 32.5 g/dL (ref 30.0–36.0)
MCV: 97.6 fl (ref 78.0–100.0)
Platelets: 298 10*3/uL (ref 150.0–400.0)
RBC: 4.31 Mil/uL (ref 3.87–5.11)
RDW: 14.7 % (ref 11.5–15.5)
WBC: 5.5 10*3/uL (ref 4.0–10.5)

## 2021-07-25 LAB — VITAMIN D 25 HYDROXY (VIT D DEFICIENCY, FRACTURES): VITD: 28.45 ng/mL — ABNORMAL LOW (ref 30.00–100.00)

## 2021-07-25 LAB — COMPREHENSIVE METABOLIC PANEL
ALT: 18 U/L (ref 0–35)
AST: 22 U/L (ref 0–37)
Albumin: 4.3 g/dL (ref 3.5–5.2)
Alkaline Phosphatase: 70 U/L (ref 39–117)
BUN: 16 mg/dL (ref 6–23)
CO2: 31 mEq/L (ref 19–32)
Calcium: 9.6 mg/dL (ref 8.4–10.5)
Chloride: 103 mEq/L (ref 96–112)
Creatinine, Ser: 0.78 mg/dL (ref 0.40–1.20)
GFR: 78.03 mL/min (ref >60.00)
Glucose, Bld: 87 mg/dL (ref 70–99)
Potassium: 5.4 mEq/L — ABNORMAL HIGH (ref 3.5–5.1)
Sodium: 139 mEq/L (ref 135–145)
Total Bilirubin: 0.4 mg/dL (ref 0.2–1.2)
Total Protein: 6.8 g/dL (ref 6.0–8.3)

## 2021-07-25 LAB — LIPID PANEL
Cholesterol: 184 mg/dL (ref 0–200)
HDL: 80.5 mg/dL (ref >39.00)
LDL Cholesterol: 87 mg/dL (ref 0–99)
NonHDL: 103.54
Total CHOL/HDL Ratio: 2
Triglycerides: 81 mg/dL (ref 0.0–149.0)
VLDL: 16.2 mg/dL (ref 0.0–40.0)

## 2021-07-25 LAB — HEMOGLOBIN A1C: Hgb A1c MFr Bld: 6.3 % (ref 4.6–6.5)

## 2021-07-25 LAB — TSH: TSH: 1.4 u[IU]/mL (ref 0.35–5.50)

## 2021-07-25 NOTE — Patient Instructions (Signed)

## 2021-07-25 NOTE — Assessment & Plan Note (Signed)
Minimize simple carbs and stay active, monitor

## 2021-07-25 NOTE — Assessment & Plan Note (Signed)
Supplement and monitor 

## 2021-07-25 NOTE — Assessment & Plan Note (Signed)
Encourage heart healthy diet such as MIND or DASH diet, increase exercise, avoid trans fats, simple carbohydrates and processed foods, consider a krill or fish or flaxseed oil cap daily.  °

## 2021-07-25 NOTE — Assessment & Plan Note (Signed)
hgba1c acceptable, minimize simple carbs. Increase exercise as tolerated.  

## 2021-07-26 NOTE — Addendum Note (Signed)
Addended by: Lynnea Ferrier R on: 07/26/2021 02:05 PM   Modules accepted: Orders

## 2021-09-10 ENCOUNTER — Encounter: Payer: Self-pay | Admitting: Family Medicine

## 2021-09-10 ENCOUNTER — Other Ambulatory Visit (INDEPENDENT_AMBULATORY_CARE_PROVIDER_SITE_OTHER): Payer: Medicare HMO

## 2021-09-10 DIAGNOSIS — E875 Hyperkalemia: Secondary | ICD-10-CM | POA: Diagnosis not present

## 2021-09-10 LAB — COMPREHENSIVE METABOLIC PANEL
ALT: 25 U/L (ref 0–35)
AST: 27 U/L (ref 0–37)
Albumin: 4.4 g/dL (ref 3.5–5.2)
Alkaline Phosphatase: 72 U/L (ref 39–117)
BUN: 15 mg/dL (ref 6–23)
CO2: 27 mEq/L (ref 19–32)
Calcium: 9.8 mg/dL (ref 8.4–10.5)
Chloride: 103 mEq/L (ref 96–112)
Creatinine, Ser: 0.8 mg/dL (ref 0.40–1.20)
GFR: 75.62 mL/min (ref >60.00)
Glucose, Bld: 125 mg/dL — ABNORMAL HIGH (ref 70–99)
Potassium: 4.3 mEq/L (ref 3.5–5.1)
Sodium: 139 mEq/L (ref 135–145)
Total Bilirubin: 0.3 mg/dL (ref 0.2–1.2)
Total Protein: 7.5 g/dL (ref 6.0–8.3)

## 2021-10-23 ENCOUNTER — Other Ambulatory Visit: Payer: Self-pay | Admitting: Family Medicine

## 2022-01-12 ENCOUNTER — Other Ambulatory Visit: Payer: Self-pay | Admitting: Family Medicine

## 2022-01-22 ENCOUNTER — Ambulatory Visit (HOSPITAL_BASED_OUTPATIENT_CLINIC_OR_DEPARTMENT_OTHER)
Admission: RE | Admit: 2022-01-22 | Discharge: 2022-01-22 | Disposition: A | Discharge status: Home / Self Care | Payer: Medicare HMO | Source: Ambulatory Visit | Attending: Family Medicine | Admitting: Family Medicine

## 2022-01-22 ENCOUNTER — Other Ambulatory Visit (HOSPITAL_BASED_OUTPATIENT_CLINIC_OR_DEPARTMENT_OTHER): Payer: Self-pay | Admitting: Family Medicine

## 2022-01-22 DIAGNOSIS — Z1231 Encounter for screening mammogram for malignant neoplasm of breast: Secondary | ICD-10-CM

## 2022-02-08 DIAGNOSIS — R69 Illness, unspecified: Secondary | ICD-10-CM | POA: Diagnosis not present

## 2022-03-11 DIAGNOSIS — R69 Illness, unspecified: Secondary | ICD-10-CM | POA: Diagnosis not present

## 2022-04-09 DIAGNOSIS — M25372 Other instability, left ankle: Secondary | ICD-10-CM | POA: Diagnosis not present

## 2022-04-09 DIAGNOSIS — M21862 Other specified acquired deformities of left lower leg: Secondary | ICD-10-CM | POA: Diagnosis not present

## 2022-04-09 DIAGNOSIS — M7732 Calcaneal spur, left foot: Secondary | ICD-10-CM | POA: Diagnosis not present

## 2022-04-09 DIAGNOSIS — R69 Illness, unspecified: Secondary | ICD-10-CM | POA: Diagnosis not present

## 2022-04-09 DIAGNOSIS — M792 Neuralgia and neuritis, unspecified: Secondary | ICD-10-CM | POA: Diagnosis not present

## 2022-04-09 DIAGNOSIS — M722 Plantar fascial fibromatosis: Secondary | ICD-10-CM | POA: Diagnosis not present

## 2022-04-22 DIAGNOSIS — J40 Bronchitis, not specified as acute or chronic: Secondary | ICD-10-CM | POA: Diagnosis not present

## 2022-04-22 DIAGNOSIS — R059 Cough, unspecified: Secondary | ICD-10-CM | POA: Diagnosis not present

## 2022-05-10 DIAGNOSIS — R69 Illness, unspecified: Secondary | ICD-10-CM | POA: Diagnosis not present

## 2022-05-20 ENCOUNTER — Telehealth: Payer: Self-pay | Admitting: Family Medicine

## 2022-05-20 NOTE — Telephone Encounter (Signed)
Copied from CRM [id number]. Topic: Medicare AWV >> May 20, 2022  9:54 AM Payton Doughty wrote: Reason for CRM: Called patient to schedule Medicare Annual Wellness Visit (AWV). Left message for patient to call back and schedule Medicare Annual Wellness Visit (AWV).  Last date of AWV: 06/06/22  Please schedule an appointment at any time with Donne Anon, CMA  .  If any questions, please contact me.  Thank you ,  Verlee Rossetti; Care Guide Ambulatory Clinical Support [place] [facility] Direct Dial: [phone]

## 2022-06-06 ENCOUNTER — Telehealth: Payer: Self-pay | Admitting: Family Medicine

## 2022-06-06 NOTE — Telephone Encounter (Signed)
Prescription Request  06/06/2022  Is this a "Controlled Substance" medicine? No  LOV: Visit date not found  What is the name of the medication or equipment?   venlafaxine XR (EFFEXOR-XR) 150 MG 24 hr capsule [[id number]]   Have you contacted your pharmacy to request a refill? No   Which pharmacy would you like this sent to?   [id number] - [place], [place] - [address] AT [address] OF [facility] [address] [place] [address] Phone: [phone] Fax: [phone]  Patient notified that their request is being sent to the clinical staff for review and that they should receive a response within 2 business days.   Please advise at Mobile [phone] (mobile)

## 2022-06-09 DIAGNOSIS — R69 Illness, unspecified: Secondary | ICD-10-CM | POA: Diagnosis not present

## 2022-06-10 ENCOUNTER — Other Ambulatory Visit: Payer: Self-pay

## 2022-06-10 MED ORDER — VENLAFAXINE HCL ER 150 MG PO CP24: ORAL_CAPSULE | ORAL | 3 refills | Status: DC

## 2022-06-10 NOTE — Telephone Encounter (Signed)
Refill sent.

## 2022-06-12 ENCOUNTER — Ambulatory Visit (INDEPENDENT_AMBULATORY_CARE_PROVIDER_SITE_OTHER): Payer: Medicare HMO | Admitting: *Deleted

## 2022-06-12 DIAGNOSIS — Z0000 Encounter for general adult medical examination without abnormal findings: Secondary | ICD-10-CM | POA: Diagnosis not present

## 2022-06-12 NOTE — Patient Instructions (Signed)
[name] , Thank you for taking time to come for your Medicare Wellness Visit. I appreciate your ongoing commitment to your health goals. Please review the following plan we discussed and let me know if I can assist you in the future.      This is a list of the screening recommended for you and due dates:  Health Maintenance  Topic Date Due   Pneumonia Vaccine (2 of 2 - PCV) 11/21/2020   COVID-19 Vaccine (8 - 2023-24 season) 03/19/2022   Flu Shot  08/14/2022   Medicare Annual Wellness Visit  06/12/2023   Mammogram  01/23/2024   DTaP/Tdap/Td vaccine (5 - Td or Tdap) 11/07/2024   Colon Cancer Screening  02/21/2025   DEXA scan (bone density measurement)  Completed   Hepatitis C Screening  Completed   Zoster (Shingles) Vaccine  Completed   HPV Vaccine  Aged Out     Next appointment: Follow up in one year for your annual wellness visit.   Preventive Care 69 Years and Older, Female Preventive care refers to lifestyle choices and visits with your health care provider that can promote health and wellness. What does preventive care include? A yearly physical exam. This is also called an annual well check. Dental exams once or twice a year. Routine eye exams. Ask your health care provider how often you should have your eyes checked. Personal lifestyle choices, including: Daily care of your teeth and gums. Regular physical activity. Eating a healthy diet. Avoiding tobacco and drug use. Limiting alcohol use. Practicing safe sex. Taking low-dose aspirin every day. Taking vitamin and mineral supplements as recommended by your health care provider. What happens during an annual well check? The services and screenings done by your health care provider during your annual well check will depend on your age, overall health, lifestyle risk factors, and family history of disease. Counseling  Your health care provider may ask you questions about your: Alcohol use. Tobacco use. Drug  use. Emotional well-being. Home and relationship well-being. Sexual activity. Eating habits. History of falls. Memory and ability to understand (cognition). Work and work Astronomer. Reproductive health. Screening  You may have the following tests or measurements: Height, weight, and BMI. Blood pressure. Lipid and cholesterol levels. These may be checked every 5 years, or more frequently if you are over 49 years old. Skin check. Lung cancer screening. You may have this screening every year starting at age 52 if you have a 30-pack-year history of smoking and currently smoke or have quit within the past 15 years. Fecal occult blood test (FOBT) of the stool. You may have this test every year starting at age 4. Flexible sigmoidoscopy or colonoscopy. You may have a sigmoidoscopy every 5 years or a colonoscopy every 10 years starting at age 32. Hepatitis C blood test. Hepatitis B blood test. Sexually transmitted disease (STD) testing. Diabetes screening. This is done by checking your blood sugar (glucose) after you have not eaten for a while (fasting). You may have this done every 1-3 years. Bone density scan. This is done to screen for osteoporosis. You may have this done starting at age 49. Mammogram. This may be done every 1-2 years. Talk to your health care provider about how often you should have regular mammograms. Talk with your health care provider about your test results, treatment options, and if necessary, the need for more tests. Vaccines  Your health care provider may recommend certain vaccines, such as: Influenza vaccine. This is recommended every year. Tetanus, diphtheria,  and acellular pertussis (Tdap, Td) vaccine. You may need a Td booster every 10 years. Zoster vaccine. You may need this after age 72. Pneumococcal 13-valent conjugate (PCV13) vaccine. One dose is recommended after age 59. Pneumococcal polysaccharide (PPSV23) vaccine. One dose is recommended after age  31. Talk to your health care provider about which screenings and vaccines you need and how often you need them. This information is not intended to replace advice given to you by your health care provider. Make sure you discuss any questions you have with your health care provider. Document Released: 01/26/2015 Document Revised: 09/19/2015 Document Reviewed: 10/31/2014 Elsevier Interactive Patient Education  2017 [employer].  Fall Prevention in the Home Falls can cause injuries. They can happen to people of all ages. There are many things you can do to make your home safe and to help prevent falls. What can I do on the outside of my home? Regularly fix the edges of walkways and driveways and fix any cracks. Remove anything that might make you trip as you walk through a door, such as a raised step or threshold. Trim any bushes or trees on the path to your home. Use bright outdoor lighting. Clear any walking paths of anything that might make someone trip, such as rocks or tools. Regularly check to see if handrails are loose or broken. Make sure that both sides of any steps have handrails. Any raised decks and porches should have guardrails on the edges. Have any leaves, snow, or ice cleared regularly. Use sand or salt on walking paths during winter. Clean up any spills in your garage right away. This includes oil or grease spills. What can I do in the bathroom? Use night lights. Install grab bars by the toilet and in the tub and shower. Do not use towel bars as grab bars. Use non-skid mats or decals in the tub or shower. If you need to sit down in the shower, use a plastic, non-slip stool. Keep the floor dry. Clean up any water that spills on the floor as soon as it happens. Remove soap buildup in the tub or shower regularly. Attach bath mats securely with double-sided non-slip rug tape. Do not have throw rugs and other things on the floor that can make you trip. What can I do in the  bedroom? Use night lights. Make sure that you have a light by your bed that is easy to reach. Do not use any sheets or blankets that are too big for your bed. They should not hang down onto the floor. Have a firm chair that has side arms. You can use this for support while you get dressed. Do not have throw rugs and other things on the floor that can make you trip. What can I do in the kitchen? Clean up any spills right away. Avoid walking on wet floors. Keep items that you use a lot in easy-to-reach places. If you need to reach something above you, use a strong step stool that has a grab bar. Keep electrical cords out of the way. Do not use floor polish or wax that makes floors slippery. If you must use wax, use non-skid floor wax. Do not have throw rugs and other things on the floor that can make you trip. What can I do with my stairs? Do not leave any items on the stairs. Make sure that there are handrails on both sides of the stairs and use them. Fix handrails that are broken or loose. Make sure  that handrails are as long as the stairways. Check any carpeting to make sure that it is firmly attached to the stairs. Fix any carpet that is loose or worn. Avoid having throw rugs at the top or bottom of the stairs. If you do have throw rugs, attach them to the floor with carpet tape. Make sure that you have a light switch at the top of the stairs and the bottom of the stairs. If you do not have them, ask someone to add them for you. What else can I do to help prevent falls? Wear shoes that: Do not have high heels. Have rubber bottoms. Are comfortable and fit you well. Are closed at the toe. Do not wear sandals. If you use a stepladder: Make sure that it is fully opened. Do not climb a closed stepladder. Make sure that both sides of the stepladder are locked into place. Ask someone to hold it for you, if possible. Clearly mark and make sure that you can see: Any grab bars or  handrails. First and last steps. Where the edge of each step is. Use tools that help you move around (mobility aids) if they are needed. These include: Canes. Walkers. Scooters. Crutches. Turn on the lights when you go into a dark area. Replace any light bulbs as soon as they burn out. Set up your furniture so you have a clear path. Avoid moving your furniture around. If any of your floors are uneven, fix them. If there are any pets around you, be aware of where they are. Review your medicines with your doctor. Some medicines can make you feel dizzy. This can increase your chance of falling. Ask your doctor what other things that you can do to help prevent falls. This information is not intended to replace advice given to you by your health care provider. Make sure you discuss any questions you have with your health care provider. Document Released: 10/26/2008 Document Revised: 06/07/2015 Document Reviewed: 02/03/2014 Elsevier Interactive Patient Education  2017 [employer].

## 2022-06-12 NOTE — Progress Notes (Signed)
Subjective:   Lauren [name] is a 67 y.o. female who presents for Medicare Annual (Subsequent) preventive examination.  I connected with  Lauren [name] on 06/12/22 by a audio enabled telemedicine application and verified that I am speaking with the correct person using two identifiers.  Patient Location: Home  Provider Location: Office/Clinic  I discussed the limitations of evaluation and management by telemedicine. The patient expressed understanding and agreed to proceed.   Review of Systems     Cardiac Risk Factors include: advanced age (>63men, >8 women);dyslipidemia     Objective:    There were no vitals filed for this visit. There is no height or weight on file to calculate BMI.     06/12/2022    3:08 PM 06/05/2021    1:42 PM 06/01/2020    9:35 AM 05/02/2020    9:03 AM 11/21/2019    3:13 PM 11/21/2019   10:52 AM 04/07/2019   11:03 AM  Advanced Directives  Does Patient Have a Medical Advance Directive? Yes Yes Yes Yes Yes No Yes  Type of Estate agent of [place];Living will Healthcare Power of [place];Out of facility DNR (pink MOST or yellow form);Living will Healthcare Power of [place];Living will Healthcare Power of [place];Living will Healthcare Power of [place];Living will  Healthcare Power of [place];Living will  Does patient want to make changes to medical advance directive? No - Patient declined No - Patient declined   No - Patient declined  No - Patient declined  Copy of Healthcare Power of Attorney in Chart? No - copy requested No - copy requested Yes - validated most recent copy scanned in chart (See row information) No - copy requested No - copy requested  No - copy requested  Would patient like information on creating a medical advance directive?     No - Patient declined No - Patient declined     Current Medications (verified) Outpatient Encounter Medications as of 06/12/2022  Medication Sig   Ascorbic Acid (VITAMIN C) 1000 MG  tablet Take 1,000 mg by mouth daily.   Cholecalciferol (VITAMIN D) 50 MCG (2000 UT) CAPS Take by mouth.   omeprazole (PRILOSEC) 20 MG capsule TAKE 1 CAPSULE BY MOUTH EVERY DAY   rosuvastatin (CRESTOR) 5 MG tablet TAKE 1 TABLET BY MOUTH EVERY DAY   venlafaxine XR (EFFEXOR-XR) 150 MG 24 hr capsule TAKE 1 CAPSULE BY MOUTH EVERY DAY WITH BREAKFAST   No facility-administered encounter medications on file as of 06/12/2022.    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   Chicken pox as a child   Depression    been on antidepressants on and off for 20 yr   GERD (gastroesophageal reflux disease)    silent with hiatal hernia   Hiatal hernia 05/22/2013   Hiatal hernia with gastroesophageal reflux 05/22/2013   Hot tub folliculitis 12/18/2013   Hyperglycemia 05/19/2016   Hyperlipidemia 05/19/2016   Measles as a child   Postmenopausal atrophic vaginitis 05/22/2013   Preventative health care 01/19/2015   Shingles 67 yrs old   Unspecified vitamin D deficiency 05/22/2013   Past Surgical History:  Procedure Laterality Date   ABDOMINAL HYSTERECTOMY  1998   partial-still has ovaries   BILIARY STENT PLACEMENT N/A 11/23/2019   Procedure: BILIARY STENT PLACEMENT;  Surgeon: Jeani Hawking, MD;  Location: [facility];  Service: Endoscopy;  Laterality: N/A;   ENDOSCOPIC RETROGRADE CHOLANGIOPANCREATOGRAPHY (ERCP) WITH PROPOFOL N/A 06/01/2020  Procedure: ENDOSCOPIC RETROGRADE CHOLANGIOPANCREATOGRAPHY (ERCP) WITH PROPOFOL;  Surgeon: Jeani Hawking, MD;  Location: [facility];  Service: Endoscopy;  Laterality: N/A;   ERCP N/A 11/23/2019   Procedure: ENDOSCOPIC RETROGRADE CHOLANGIOPANCREATOGRAPHY (ERCP);  Surgeon: Jeani Hawking, MD;  Location: [facility];  Service: Endoscopy;  Laterality: N/A;   ESOPHAGECTOMY  05-2008   INDUCED ABORTION  1979   REMOVAL OF STONES  11/23/2019   Procedure: REMOVAL OF STONES;  Surgeon: Jeani Hawking, MD;   Location: [facility];  Service: Endoscopy;;   SPHINCTEROTOMY  11/23/2019   Procedure: Dennison Mascot;  Surgeon: Jeani Hawking, MD;  Location: [facility];  Service: Endoscopy;;   STENT REMOVAL  06/01/2020   Procedure: STENT REMOVAL;  Surgeon: Jeani Hawking, MD;  Location: [facility];  Service: Endoscopy;;   Family History  Problem Relation Age of Onset   Cancer Mother 55       breast cancer   Emphysema Mother        smoker   Heart attack Father        X several   Mental illness Brother        bipolar, xanax and thc abuse   Obesity Brother    Depression Daughter    Anxiety disorder Daughter    Other Maternal Grandmother        blood clot, dvt, lung   Cancer Paternal Grandmother        stomach   Heart disease Paternal Grandfather    Social History   Socioeconomic History   Marital status: Married    Spouse name: Rosanne Ashing   Number of children: Not on file   Years of education: Not on file   Highest education level: Not on file  Occupational History    Employer: [employer]    Comment: Event Planner  Tobacco Use   Smoking status: Former    Packs/day: 0.10    Years: 25.00    Additional pack years: 0.00    Total pack years: 2.50    Types: Cigarettes    Start date: 01/13/1998   Smokeless tobacco: Never   Tobacco comments:    1-2 cigarettes a day  Vaping Use   Vaping Use: Never used  Substance and Sexual Activity   Alcohol use: Yes    Comment: a couple glasses of wine every other day   Drug use: No   Sexual activity: Yes    Partners: Male    Comment: lives with husband, event planner, no dietary restrictions  Other Topics Concern   Not on file  Social History Narrative   Married, husband Pharmacist, hospital   Social Determinants of Health   Financial Resource Strain: Low Risk  (06/05/2021)   Overall Financial Resource Strain (CARDIA)    Difficulty of Paying Living Expenses: Not hard at all  Food Insecurity: No Food Insecurity (06/12/2022)   Hunger  Vital Sign    Worried About Running Out of Food in the Last Year: Never true    Ran Out of Food in the Last Year: Never true  Transportation Needs: No Transportation Needs (06/12/2022)   PRAPARE - Administrator, Civil Service (Medical): No    Lack of Transportation (Non-Medical): No  Physical Activity: Insufficiently Active (06/05/2021)   Exercise Vital Sign    Days of Exercise per Week: 3 days    Minutes of Exercise per Session: 40 min  Stress: No Stress Concern Present (06/05/2021)   [employer] of Occupational Health - Occupational Stress Questionnaire  Feeling of Stress : Not at all  Social Connections: Moderately Integrated (06/05/2021)   Social Connection and Isolation Panel [NHANES]    Frequency of Communication with Friends and Family: More than three times a week    Frequency of Social Gatherings with Friends and Family: More than three times a week    Attends Religious Services: Never    Database administrator or Organizations: Yes    Attends Engineer, structural: More than 4 times per year    Marital Status: Married    Tobacco Counseling Counseling given: Not Answered Tobacco comments: 1-2 cigarettes a day   Clinical Intake:  Pre-visit preparation completed: Yes  Pain : No/denies pain  Nutritional Risks: None Diabetes: No  How often do you need to have someone help you when you read instructions, pamphlets, or other written materials from your doctor or pharmacy?: 1 - Never  Activities of Daily Living    06/12/2022    3:11 PM  In your present state of health, do you have any difficulty performing the following activities:  Hearing? 0  Vision? 0  Difficulty concentrating or making decisions? 1  Comment forgetfullness  Walking or climbing stairs? 0  Dressing or bathing? 0  Doing errands, shopping? 0  Preparing Food and eating ? N  Using the Toilet? N  In the past six months, have you accidently leaked urine? Y  Comment a little  stress incontinence  Do you have problems with loss of bowel control? N  Managing your Medications? N  Managing your Finances? N  Housekeeping or managing your Housekeeping? N    Patient Care Team: Bradd Canary, MD as PCP - General (Family Medicine)  Indicate any recent Medical Services you may have received from other than [facility] providers in the past year (date may be approximate).     Assessment:   This is a routine wellness examination for Vanessa.  Hearing/Vision screen No results found.  Dietary issues and exercise activities discussed: Current Exercise Habits: Home exercise routine, Type of exercise: strength training/weights;yoga, Time (Minutes): 60, Frequency (Times/Week): 3, Weekly Exercise (Minutes/Week): 180, Intensity: Moderate, Exercise limited by: None identified   Goals Addressed   None    Depression Screen    06/12/2022    3:11 PM 07/25/2021   11:23 AM 07/25/2021   11:17 AM 06/05/2021    1:43 PM 10/15/2020   10:34 AM 05/02/2020    9:06 AM 04/12/2020    9:04 AM  PHQ 2/9 Scores  PHQ - 2 Score 0 0 0 0 0 0 0  PHQ- 9 Score  2   2  0    Fall Risk    06/12/2022    3:09 PM 07/25/2021   11:17 AM 06/05/2021    1:42 PM 05/02/2020    9:04 AM 04/12/2020    9:04 AM  Fall Risk   Falls in the past year? 0 0 0 1 1  Number falls in past yr: 0 0 0 0 0  Injury with Fall? 0 0 0 0 1  Risk for fall due to : No Fall Risks No Fall Risks No Fall Risks    Follow up Falls evaluation completed Falls evaluation completed Falls evaluation completed Falls prevention discussed     FALL RISK PREVENTION PERTAINING TO THE HOME:  Any stairs in or around the home? Yes  If so, are there any without handrails? Yes  Home free of loose throw rugs in walkways, pet beds, electrical cords, etc? Yes  Adequate lighting in your home to reduce risk of falls? Yes   ASSISTIVE DEVICES UTILIZED TO PREVENT FALLS:  Life alert? No  Use of a cane, walker or w/c? No  Grab bars in the bathroom? No   Shower chair or bench in shower? No  Elevated toilet seat or a handicapped toilet? No   TIMED UP AND GO:  Was the test performed?  No, audio visit .    Cognitive Function:        06/12/2022    3:19 PM 06/05/2021    1:45 PM 05/02/2020    9:10 AM  6CIT Screen  What Year? 0 points 0 points 0 points  What month? 0 points 0 points 0 points  What time? 0 points 0 points 0 points  Count back from 20 0 points 0 points 0 points  Months in reverse 0 points 0 points 0 points  Repeat phrase 0 points 0 points 0 points  Total Score 0 points 0 points 0 points    Immunizations Immunization History  Administered Date(s) Administered   Covid-19, Mrna,Vaccine(Spikevax)26yrs and older 01/22/2022   Fluad Quad(high Dose 65+) 10/15/2020   Influenza, High Dose Seasonal PF 09/27/2018, 09/26/2019, 10/08/2021   Influenza,inj,Quad PF,6+ Mos 11/08/2014, 10/20/2017   Influenza-Unspecified 11/17/2013   PFIZER Comirnaty(Gray Top)Covid-19 Tri-Sucrose Vaccine 05/01/2020   PFIZER(Purple Top)SARS-COV-2 Vaccination 02/18/2019, 03/15/2019, 09/14/2019, 05/01/2020   Pfizer Covid-19 Vaccine Bivalent Booster 1yrs & up 10/10/2020   Pneumococcal Polysaccharide-23 11/22/2019   Td 01/14/2003   Td (Adult), 2 Lf Tetanus Toxid, Preservative Free 01/14/2003   Tdap 01/13/2010, 06/28/2010, 11/08/2014   Zoster Recombinat (Shingrix) 10/11/2018, 08/05/2019   Zoster, Live 05/02/2013    TDAP status: Up to date  Flu Vaccine status: Up to date  Pneumococcal vaccine status: Due, Education has been provided regarding the importance of this vaccine. Advised may receive this vaccine at local pharmacy or Health Dept. Aware to provide a copy of the vaccination record if obtained from local pharmacy or Health Dept. Verbalized acceptance and understanding.  Covid-19 vaccine status: Information provided on how to obtain vaccines.   Qualifies for Shingles Vaccine? Yes   Zostavax completed Yes   Shingrix Completed?:  Yes  Screening Tests Health Maintenance  Topic Date Due   Pneumonia Vaccine 3+ Years old (2 of 2 - PCV) 11/21/2020   COVID-19 Vaccine (8 - 2023-24 season) 03/19/2022   Medicare Annual Wellness (AWV)  06/06/2022   INFLUENZA VACCINE  08/14/2022   MAMMOGRAM  01/23/2024   DTaP/Tdap/Td (5 - Td or Tdap) 11/07/2024   Colonoscopy  02/21/2025   DEXA SCAN  Completed   Hepatitis C Screening  Completed   Zoster Vaccines- Shingrix  Completed   HPV VACCINES  Aged Out    Health Maintenance  Health Maintenance Due  Topic Date Due   Pneumonia Vaccine 61+ Years old (2 of 2 - PCV) 11/21/2020   COVID-19 Vaccine (8 - 2023-24 season) 03/19/2022   Medicare Annual Wellness (AWV)  06/06/2022    Colorectal cancer screening: Type of screening: Colonoscopy. Completed 02/22/15. Repeat every 10 years  Mammogram status: Completed 01/22/22. Repeat every year  Bone Density status: Completed 04/24/17. Results reflect: Bone density results: NORMAL. Repeat every 2 years.  Lung Cancer Screening: (Low Dose CT Chest recommended if Age 70-80 years, 30 pack-year currently smoking OR have quit w/in 15years.) does not qualify.   Additional Screening:  Hepatitis C Screening: does qualify; Completed 11/22/19  Vision Screening: Recommended annual ophthalmology exams for early detection of glaucoma and other disorders  of the eye. Is the patient up to date with their annual eye exam?  No  Who is the provider or what is the name of the office in which the patient attends annual eye exams? Doesn't have eye doctor at this time If pt is not established with a provider, would they like to be referred to a provider to establish care? No .   Dental Screening: Recommended annual dental exams for proper oral hygiene  Community Resource Referral / Chronic Care Management: CRR required this visit?  No   CCM required this visit?  No      Plan:     I have personally reviewed and noted the following in the patient's chart:    Medical and social history Use of alcohol, tobacco or illicit drugs  Current medications and supplements including opioid prescriptions. Patient is not currently taking opioid prescriptions. Functional ability and status Nutritional status Physical activity Advanced directives List of other physicians Hospitalizations, surgeries, and ER visits in previous 12 months Vitals Screenings to include cognitive, depression, and falls Referrals and appointments  In addition, I have reviewed and discussed with patient certain preventive protocols, quality metrics, and best practice recommendations. A written personalized care plan for preventive services as well as general preventive health recommendations were provided to patient.   Due to this being a telephonic visit, the after visit summary with patients personalized plan was offered to patient via mail or my-chart. Patient would like to access on my-chart.  Donne Anon, [place]   06/12/2022   Nurse Notes: None

## 2022-06-18 DIAGNOSIS — Z85828 Personal history of other malignant neoplasm of skin: Secondary | ICD-10-CM | POA: Diagnosis not present

## 2022-06-18 DIAGNOSIS — L821 Other seborrheic keratosis: Secondary | ICD-10-CM | POA: Diagnosis not present

## 2022-06-18 DIAGNOSIS — D2262 Melanocytic nevi of left upper limb, including shoulder: Secondary | ICD-10-CM | POA: Diagnosis not present

## 2022-06-18 DIAGNOSIS — Z85820 Personal history of malignant melanoma of skin: Secondary | ICD-10-CM | POA: Diagnosis not present

## 2022-06-18 DIAGNOSIS — D225 Melanocytic nevi of trunk: Secondary | ICD-10-CM | POA: Diagnosis not present

## 2022-06-18 DIAGNOSIS — D485 Neoplasm of uncertain behavior of skin: Secondary | ICD-10-CM | POA: Diagnosis not present

## 2022-06-18 DIAGNOSIS — D2272 Melanocytic nevi of left lower limb, including hip: Secondary | ICD-10-CM | POA: Diagnosis not present

## 2022-06-18 DIAGNOSIS — L814 Other melanin hyperpigmentation: Secondary | ICD-10-CM | POA: Diagnosis not present

## 2022-06-18 DIAGNOSIS — L570 Actinic keratosis: Secondary | ICD-10-CM | POA: Diagnosis not present

## 2022-06-18 DIAGNOSIS — D2261 Melanocytic nevi of right upper limb, including shoulder: Secondary | ICD-10-CM | POA: Diagnosis not present

## 2022-06-22 DIAGNOSIS — M5416 Radiculopathy, lumbar region: Secondary | ICD-10-CM | POA: Diagnosis not present

## 2022-06-25 ENCOUNTER — Encounter: Payer: Self-pay | Admitting: Family Medicine

## 2022-06-25 ENCOUNTER — Telehealth: Payer: Self-pay | Admitting: Family Medicine

## 2022-06-25 DIAGNOSIS — M79606 Pain in leg, unspecified: Secondary | ICD-10-CM | POA: Insufficient documentation

## 2022-06-25 NOTE — Assessment & Plan Note (Signed)
Encourage heart healthy diet such as MIND or DASH diet, increase exercise, avoid trans fats, simple carbohydrates and processed foods, consider a krill or fish or flaxseed oil cap daily.  °

## 2022-06-25 NOTE — Assessment & Plan Note (Addendum)
Sudden onset of severe pain in thigh,  right anterior, she was seen in UC and given Methocarbimol, Gabapentin, and Prednisone but pain persists. No trauma or fall. Pain resolved now but given a medrol dose pak and Tizanidine to use prn if recurs. Consider Sports Med referral if returns

## 2022-06-25 NOTE — Telephone Encounter (Signed)
Copied from CRM [id number]. Topic: Medicare AWV >> Jun 25, 2022 11:39 AM Payton Doughty wrote: Reason for CRM: LM 06/25/2022 to schedule AWV   Verlee Rossetti; Care Guide Ambulatory Clinical Support [place] [facility] Direct Dial: [phone]

## 2022-06-25 NOTE — Assessment & Plan Note (Signed)
hgba1c acceptable, minimize simple carbs. Increase exercise as tolerated.  

## 2022-06-25 NOTE — Assessment & Plan Note (Signed)
Supplement and monitor 

## 2022-06-26 ENCOUNTER — Encounter: Payer: Self-pay | Admitting: Family Medicine

## 2022-06-26 ENCOUNTER — Ambulatory Visit (INDEPENDENT_AMBULATORY_CARE_PROVIDER_SITE_OTHER): Payer: Medicare HMO | Admitting: Family Medicine

## 2022-06-26 VITALS — BP 122/68 | HR 80 | Ht 63.0 in | Wt 123.2 lb

## 2022-06-26 DIAGNOSIS — E559 Vitamin D deficiency, unspecified: Secondary | ICD-10-CM

## 2022-06-26 DIAGNOSIS — R739 Hyperglycemia, unspecified: Secondary | ICD-10-CM

## 2022-06-26 DIAGNOSIS — M79605 Pain in left leg: Secondary | ICD-10-CM

## 2022-06-26 DIAGNOSIS — M79604 Pain in right leg: Secondary | ICD-10-CM

## 2022-06-26 DIAGNOSIS — E785 Hyperlipidemia, unspecified: Secondary | ICD-10-CM

## 2022-06-26 MED ORDER — METHYLPREDNISOLONE 4 MG PO TABS
ORAL_TABLET | ORAL | 0 refills | Status: DC
Start: 2022-06-26 — End: 2023-01-26

## 2022-06-26 MED ORDER — TIZANIDINE HCL 2 MG PO TABS: 1.0000 mg | ORAL_TABLET | Freq: Three times a day (TID) | ORAL | 1 refills | Status: DC | PRN

## 2022-06-26 NOTE — Patient Instructions (Addendum)
Sciatica  Sciatica is pain, numbness, weakness, or tingling along the path of the sciatic nerve. The sciatic nerve starts in the lower back and runs down the back of each leg. The nerve controls the muscles in the lower leg and in the back of the knee. It also provides feeling (sensation) to the back of the thigh, the lower leg, and the sole of the foot. Sciatica is a symptom of another medical condition that pinches or puts pressure on the sciatic nerve. Sciatica most often only affects one side of the body. Sciatica usually goes away on its own or with treatment. In some cases, sciatica may come back (recur). What are the causes? This condition is caused by pressure on the sciatic nerve or pinching of the nerve. This may be the result of: A disk in between the bones of the spine bulging out too far (herniated disk). Age-related changes in the spinal disks. A pain disorder that affects a muscle in the buttock. Extra bone growth near the sciatic nerve. A break (fracture) of the pelvis. Pregnancy. Tumor. This is rare. What increases the risk? The following factors may make you more likely to develop this condition: Playing sports that place pressure or stress on the spine. Having poor strength and flexibility. A history of back injury or surgery. Sitting for long periods of time. Doing activities that involve repetitive bending or lifting. Obesity. What are the signs or symptoms? Symptoms can vary from mild to very severe. They may include: Any of the following problems in the lower back, leg, hip, or buttock: Mild tingling, numbness, or dull aches. Burning sensations. Sharp pains. Numbness in the back of the calf or the sole of the foot. Leg weakness. Severe back pain that makes movement difficult. Symptoms may get worse when you cough, sneeze, or laugh, or when you sit or stand for long periods of time. How is this diagnosed? This condition may be diagnosed based on: Your symptoms  and medical history. A physical exam. Blood tests. Imaging tests, such as: X-rays. An MRI. A CT scan. How is this treated? In many cases, this condition improves on its own without treatment. However, treatment may include: Reducing or modifying physical activity. Exercising, including strengthening and stretching. Icing and applying heat to the affected area. Medicines that help to: Relieve pain and swelling. Relax your muscles. Injections of medicines that help to relieve pain and inflammation (steroids) around the sciatic nerve. Surgery. Follow these instructions at home: Medicines Take over-the-counter and prescription medicines only as told by your health care provider. Ask your health care provider if the medicine prescribed to you requires you to avoid driving or using heavy machinery. Managing pain     If directed, put ice on the affected area. To do this: Put ice in a plastic bag. Place a towel between your skin and the bag. Leave the ice on for 20 minutes, 2-3 times a day. If your skin turns bright red, remove the ice right away to prevent skin damage. The risk of skin damage is higher if you cannot feel pain, heat, or cold. If directed, apply heat to the affected area as often as told by your health care provider. Use the heat source that your health care provider recommends, such as a moist heat pack or a heating pad. Place a towel between your skin and the heat source. Leave the heat on for 20-30 minutes. If your skin turns bright red, remove the heat right away to prevent burns. The   risk of burns is higher if you cannot feel pain, heat, or cold. Activity  Return to your normal activities as told by your health care provider. Ask your health care provider what activities are safe for you. Avoid activities that make your symptoms worse. Take brief periods of rest throughout the day. When you rest for longer periods, mix in some mild activity or stretching between  periods of rest. This will help to prevent stiffness and pain. Avoid sitting for long periods of time without moving. Get up and move around at least one time each hour. Exercise and stretch regularly as told by your health care provider. Do not lift anything that is heavier than 10 lb (4.5 kg) until your health care provider says that it is safe. When you do not have symptoms, you should still avoid heavy lifting, especially repetitive heavy lifting. When you lift objects, always use proper lifting technique, which includes: Bending your knees. Keeping the load close to your body. Avoiding twisting. General instructions Maintain a healthy weight. Excess weight puts extra stress on your back. Wear supportive, comfortable shoes. Avoid wearing high heels. Avoid sleeping on a mattress that is too soft or too hard. A mattress that is firm enough to support your back when you sleep may help to reduce your pain. Contact a health care provider if: Your pain is not controlled by medicine. Your pain does not improve or gets worse. Your pain lasts longer than 4 weeks. You have unexplained weight loss. Get help right away if: You are not able to control when you urinate or have bowel movements (incontinence). You have: Weakness in your lower back, pelvis, buttocks, or legs that gets worse. Redness or swelling of your back. A burning sensation when you urinate. Summary Sciatica is pain, numbness, weakness, or tingling along the path of the sciatic nerve, which may include the lower back, legs, hips, and buttocks. This condition is caused by pressure on the sciatic nerve or pinching of the nerve. Treatment often includes rest, exercise, medicines, and applying ice or heat. This information is not intended to replace advice given to you by your health care provider. Make sure you discuss any questions you have with your health care provider. Document Revised: 04/08/2021 Document Reviewed:  04/08/2021 Elsevier Patient Education  2024 Elsevier Inc.  

## 2022-06-26 NOTE — Progress Notes (Signed)
Subjective:   By signing my name below, I, Vickey Sages, attest that this documentation has been prepared under the direction and in the presence of Bradd Canary, MD., 06/26/2022.   Patient ID: Lauren [name], female    DOB: [birth year], 67 y.o.   MRN: [id number]  Chief Complaint  Patient presents with   Leg Pain    Pt went to urgent care and was told it sounds like a sciatic nerve problem    HPI Patient is in today for an office visit. She denies febrile illness, CP/palpitations/SOB/HA/fever/chills/GI or GU symptoms.  Right Thigh Pain Patient reports that on 06/22/2022, she experienced a sudden tightening pain in her anterior right thigh. This pain was debilitating and led her to urgent care, where she was prescribed Gabapentin 300 mg, Methocarbamol, and a 5-day regimen of Methylprednisolone 4 mg. She started the Gabapentin and will complete the Prednisone regimen on 06/27/2022. She states that these medications have resolved the pain, but she has not started the Methocarbamol. Today she denies any leg pain or associated back pain. She also denies any recent falls.  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   Chicken pox as a child   Depression    been on antidepressants on and off for 20 yr   GERD (gastroesophageal reflux disease)    silent with hiatal hernia   Hiatal hernia 05/22/2013   Hiatal hernia with gastroesophageal reflux 05/22/2013   Hot tub folliculitis 12/18/2013   Hyperglycemia 05/19/2016   Hyperlipidemia 05/19/2016   Measles as a child   Postmenopausal atrophic vaginitis 05/22/2013   Preventative health care 01/19/2015   Shingles 67 yrs old   Unspecified vitamin D deficiency 05/22/2013    Past Surgical History:  Procedure Laterality Date   ABDOMINAL HYSTERECTOMY  1998   partial-still has ovaries   BILIARY STENT PLACEMENT N/A 11/23/2019   Procedure: BILIARY STENT PLACEMENT;  Surgeon: Jeani Hawking, MD;  Location: [facility];   Service: Endoscopy;  Laterality: N/A;   ENDOSCOPIC RETROGRADE CHOLANGIOPANCREATOGRAPHY (ERCP) WITH PROPOFOL N/A 06/01/2020   Procedure: ENDOSCOPIC RETROGRADE CHOLANGIOPANCREATOGRAPHY (ERCP) WITH PROPOFOL;  Surgeon: Jeani Hawking, MD;  Location: [facility];  Service: Endoscopy;  Laterality: N/A;   ERCP N/A 11/23/2019   Procedure: ENDOSCOPIC RETROGRADE CHOLANGIOPANCREATOGRAPHY (ERCP);  Surgeon: Jeani Hawking, MD;  Location: [facility];  Service: Endoscopy;  Laterality: N/A;   ESOPHAGECTOMY  05-2008   INDUCED ABORTION  1979   REMOVAL OF STONES  11/23/2019   Procedure: REMOVAL OF STONES;  Surgeon: Jeani Hawking, MD;  Location: [facility];  Service: Endoscopy;;   SPHINCTEROTOMY  11/23/2019   Procedure: Dennison Mascot;  Surgeon: Jeani Hawking, MD;  Location: [facility];  Service: Endoscopy;;   STENT REMOVAL  06/01/2020   Procedure: STENT REMOVAL;  Surgeon: Jeani Hawking, MD;  Location: [facility];  Service: Endoscopy;;    Family History  Problem Relation Age of Onset   Cancer Mother 65       breast cancer   Emphysema Mother        smoker   Heart attack Father        X several   Mental illness Brother        bipolar, xanax and thc abuse   Obesity Brother    Depression Daughter    Anxiety disorder Daughter    Other Maternal Grandmother        blood clot, dvt, lung   Cancer Paternal Grandmother  stomach   Heart disease Paternal Grandfather     Social History   Socioeconomic History   Marital status: Married    Spouse name: Rosanne Ashing   Number of children: Not on file   Years of education: Not on file   Highest education level: Not on file  Occupational History    Employer: [employer]    Comment: Event Planner  Tobacco Use   Smoking status: Former    Packs/day: 0.10    Years: 25.00    Additional pack years: 0.00    Total pack years: 2.50    Types: Cigarettes    Start date: 01/13/1998   Smokeless tobacco: Never   Tobacco comments:    1-2 cigarettes a day   Vaping Use   Vaping Use: Never used  Substance and Sexual Activity   Alcohol use: Yes    Comment: a couple glasses of wine every other day   Drug use: No   Sexual activity: Yes    Partners: Male    Comment: lives with husband, event planner, no dietary restrictions  Other Topics Concern   Not on file  Social History Narrative   Married, husband Pharmacist, hospital   Social Determinants of Health   Financial Resource Strain: Low Risk  (06/05/2021)   Overall Financial Resource Strain (CARDIA)    Difficulty of Paying Living Expenses: Not hard at all  Food Insecurity: No Food Insecurity (06/12/2022)   Hunger Vital Sign    Worried About Running Out of Food in the Last Year: Never true    Ran Out of Food in the Last Year: Never true  Transportation Needs: No Transportation Needs (06/12/2022)   PRAPARE - Administrator, Civil Service (Medical): No    Lack of Transportation (Non-Medical): No  Physical Activity: Insufficiently Active (06/05/2021)   Exercise Vital Sign    Days of Exercise per Week: 3 days    Minutes of Exercise per Session: 40 min  Stress: No Stress Concern Present (06/05/2021)   [employer] of Occupational Health - Occupational Stress Questionnaire    Feeling of Stress : Not at all  Social Connections: Moderately Integrated (06/05/2021)   Social Connection and Isolation Panel [NHANES]    Frequency of Communication with Friends and Family: More than three times a week    Frequency of Social Gatherings with Friends and Family: More than three times a week    Attends Religious Services: Never    Database administrator or Organizations: Yes    Attends Engineer, structural: More than 4 times per year    Marital Status: Married  Catering manager Violence: Not At Risk (06/12/2022)   Humiliation, Afraid, Rape, and Kick questionnaire    Fear of Current or Ex-Partner: No    Emotionally Abused: No    Physically Abused: No    Sexually Abused: No     Outpatient Medications Prior to Visit  Medication Sig Dispense Refill   Ascorbic Acid (VITAMIN C) 1000 MG tablet Take 1,000 mg by mouth daily.     Cholecalciferol (VITAMIN D) 50 MCG (2000 UT) CAPS Take by mouth.     gabapentin (NEURONTIN) 300 MG capsule Take by mouth.     omeprazole (PRILOSEC) 20 MG capsule TAKE 1 CAPSULE BY MOUTH EVERY DAY 90 capsule 3   rosuvastatin (CRESTOR) 5 MG tablet TAKE 1 TABLET BY MOUTH EVERY DAY 90 tablet 0   venlafaxine XR (EFFEXOR-XR) 150 MG 24 hr capsule TAKE 1  CAPSULE BY MOUTH EVERY DAY WITH BREAKFAST 90 capsule 3   methylPREDNISolone (MEDROL DOSEPAK) 4 MG TBPK tablet Take by mouth.     No facility-administered medications prior to visit.    Allergies  Allergen Reactions   Penicillins Hives and Nausea And Vomiting    PCN - Hives when she was 14 Amoxicillin - vomiting and chills (11/22/19)      Review of Systems  Constitutional:  Negative for chills and fever.  Respiratory:  Negative for shortness of breath.   Cardiovascular:  Negative for chest pain and palpitations.  Gastrointestinal:  Negative for abdominal pain, blood in stool, constipation, diarrhea, nausea and vomiting.  Genitourinary:  Negative for dysuria, frequency, hematuria and urgency.  Skin:           Neurological:  Negative for headaches.       Objective:    Physical Exam Constitutional:      General: She is not in acute distress.    Appearance: Normal appearance. She is not ill-appearing.  HENT:     Head: Normocephalic and atraumatic.     Right Ear: External ear normal.     Left Ear: External ear normal.     Nose: Nose normal.     Mouth/Throat:     Mouth: Mucous membranes are moist.     Pharynx: Oropharynx is clear.  Eyes:     General:        Right eye: No discharge.        Left eye: No discharge.     Extraocular Movements: Extraocular movements intact.     Conjunctiva/sclera: Conjunctivae normal.     Pupils: Pupils are equal, round, and reactive to light.   Cardiovascular:     Rate and Rhythm: Normal rate and regular rhythm.     Pulses: Normal pulses.     Heart sounds: Normal heart sounds. No murmur heard.    No gallop.  Pulmonary:     Effort: Pulmonary effort is normal. No respiratory distress.     Breath sounds: Normal breath sounds. No wheezing or rales.  Abdominal:     General: Bowel sounds are normal.     Palpations: Abdomen is soft.     Tenderness: There is no abdominal tenderness. There is no guarding.  Musculoskeletal:        General: Normal range of motion.     Cervical back: Normal range of motion.     Right lower leg: No edema.     Left lower leg: No edema.     Comments: Muscle strength is 5/5 on lower extremities.  Skin:    General: Skin is warm and dry.  Neurological:     Mental Status: She is alert and oriented to person, place, and time.     Deep Tendon Reflexes:     Reflex Scores:      Patellar reflexes are 1+ on the right side and 2+ on the left side. Psychiatric:        Mood and Affect: Mood normal.        Behavior: Behavior normal.        Judgment: Judgment normal.     BP 122/68 (BP Location: Left Arm, Patient Position: Sitting, Cuff Size: Normal)   Pulse 80   Ht 5\' 3"  (1.6 m)   Wt 123 lb 3.2 oz (55.9 kg)   SpO2 98%   BMI 21.82 kg/m  Wt Readings from Last 3 Encounters:  06/26/22 123 lb 3.2 oz (55.9 kg)  07/25/21 119 lb (  54 kg)  10/15/20 123 lb 3.2 oz (55.9 kg)    Diabetic Foot Exam - Simple   No data filed    Lab Results  Component Value Date   WBC 5.5 07/25/2021   HGB 13.7 07/25/2021   HCT 42.1 07/25/2021   PLT 298.0 07/25/2021   GLUCOSE 125 (H) 09/10/2021   CHOL 184 07/25/2021   TRIG 81.0 07/25/2021   HDL 80.50 07/25/2021   LDLCALC 87 07/25/2021   ALT 25 09/10/2021   AST 27 09/10/2021   NA 139 09/10/2021   K 4.3 09/10/2021   CL 103 09/10/2021   CREATININE 0.80 09/10/2021   BUN 15 09/10/2021   CO2 27 09/10/2021   TSH 1.40 07/25/2021   INR 1.0 05/04/2008   HGBA1C 6.3 07/25/2021     Lab Results  Component Value Date   TSH 1.40 07/25/2021   Lab Results  Component Value Date   WBC 5.5 07/25/2021   HGB 13.7 07/25/2021   HCT 42.1 07/25/2021   MCV 97.6 07/25/2021   PLT 298.0 07/25/2021   Lab Results  Component Value Date   NA 139 09/10/2021   K 4.3 09/10/2021   CO2 27 09/10/2021   GLUCOSE 125 (H) 09/10/2021   BUN 15 09/10/2021   CREATININE 0.80 09/10/2021   BILITOT 0.3 09/10/2021   ALKPHOS 72 09/10/2021   AST 27 09/10/2021   ALT 25 09/10/2021   PROT 7.5 09/10/2021   ALBUMIN 4.4 09/10/2021   CALCIUM 9.8 09/10/2021   ANIONGAP 8 11/24/2019   GFR 75.62 09/10/2021   Lab Results  Component Value Date   CHOL 184 07/25/2021   Lab Results  Component Value Date   HDL 80.50 07/25/2021   Lab Results  Component Value Date   LDLCALC 87 07/25/2021   Lab Results  Component Value Date   TRIG 81.0 07/25/2021   Lab Results  Component Value Date   CHOLHDL 2 07/25/2021   Lab Results  Component Value Date   HGBA1C 6.3 07/25/2021      Assessment & Plan:  Labs: Routine blood work ordered.  Hyperglycemia: HGBA1C is acceptable. Encouraged 6-8 hours of sleep, heart healthy diet, 60-80 oz of non-alcohol/non-caffeinated fluids, and 4000-8000 steps daily.  Hyperlipidemia: This is monitored and well-controlled with Rosuvastatin 5 mg daily. There are no medication adjustments today.  Right Thigh Pain: Methylprednisolone 4 mg and Tizanidine 2 mg prescribed.  Vitamin D Deficiency: This is monitored and controlled with vitamin D 2000 IU daily. Problem List Items Addressed This Visit     Hyperglycemia    hgba1c acceptable, minimize simple carbs. Increase exercise as tolerated.       Relevant Orders   Comprehensive metabolic panel   Hemoglobin A1c   TSH   Hyperlipidemia    Encourage heart healthy diet such as MIND or DASH diet, increase exercise, avoid trans fats, simple carbohydrates and processed foods, consider a krill or fish or flaxseed oil cap  daily.       Relevant Orders   Lipid panel   TSH   Leg pain - Primary    Sudden onset of severe pain in thigh,  right anterior, she was seen in UC and given Methocarbimol, Gabapentin, and Prednisone but pain persists. No trauma or fall. Pain resolved now but given a medrol dose pak and Tizanidine to use prn if recurs. Consider Sports Med referral if returns      Relevant Orders   CBC with Differential/Platelet   Vitamin D deficiency    Supplement and  monitor       Relevant Orders   VITAMIN D 25 Hydroxy (Vit-D Deficiency, Fractures)   Meds ordered this encounter  Medications   tiZANidine (ZANAFLEX) 2 MG tablet    Sig: Take 0.5-2 tablets (1-4 mg total) by mouth every 8 (eight) hours as needed for muscle spasms.    Dispense:  30 tablet    Refill:  1   methylPREDNISolone (MEDROL) 4 MG tablet    Sig: 5 tabs po x 1 day then 4 tabs po x 1 day then 3 tabs po x 1 day then 2 tabs po x 1 day then 1 tab po x 1 day and stop    Dispense:  15 tablet    Refill:  0   I, Danise Edge, MD, personally preformed the services described in this documentation.  All medical record entries made by the scribe were at my direction and in my presence.  I have reviewed the chart and discharge instructions (if applicable) and agree that the record reflects my personal performance and is accurate and complete. 06/26/2022  I,Mohammed Iqbal,acting as a scribe for Danise Edge, MD.,have documented all relevant documentation on the behalf of Danise Edge, MD,as directed by  Danise Edge, MD while in the presence of Danise Edge, MD.  Danise Edge, MD

## 2022-06-27 LAB — LIPID PANEL
Cholesterol: 169 mg/dL (ref 0–200)
HDL: 71.1 mg/dL (ref >39.00)
LDL Cholesterol: 83 mg/dL (ref 0–99)
NonHDL: 98.33
Total CHOL/HDL Ratio: 2
Triglycerides: 78 mg/dL (ref 0.0–149.0)
VLDL: 15.6 mg/dL (ref 0.0–40.0)

## 2022-06-27 LAB — CBC WITH DIFFERENTIAL/PLATELET
Basophils Absolute: 0 10*3/uL (ref 0.0–0.1)
Basophils Relative: 0.6 % (ref 0.0–3.0)
Eosinophils Absolute: 0 10*3/uL (ref 0.0–0.7)
Eosinophils Relative: 0.4 % (ref 0.0–5.0)
HCT: 42 % (ref 36.0–46.0)
Hemoglobin: 13.5 g/dL (ref 12.0–15.0)
Lymphocytes Relative: 19.4 % (ref 12.0–46.0)
Lymphs Abs: 1.7 10*3/uL (ref 0.7–4.0)
MCHC: 32.2 g/dL (ref 30.0–36.0)
MCV: 97.3 fl (ref 78.0–100.0)
Monocytes Absolute: 0.5 10*3/uL (ref 0.1–1.0)
Monocytes Relative: 5.7 % (ref 3.0–12.0)
Neutro Abs: 6.4 10*3/uL (ref 1.4–7.7)
Neutrophils Relative %: 73.9 % (ref 43.0–77.0)
Platelets: 312 10*3/uL (ref 150.0–400.0)
RBC: 4.32 Mil/uL (ref 3.87–5.11)
RDW: 14.6 % (ref 11.5–15.5)
WBC: 8.6 10*3/uL (ref 4.0–10.5)

## 2022-06-27 LAB — COMPREHENSIVE METABOLIC PANEL
ALT: 21 U/L (ref 0–35)
AST: 26 U/L (ref 0–37)
Albumin: 4.5 g/dL (ref 3.5–5.2)
Alkaline Phosphatase: 66 U/L (ref 39–117)
BUN: 24 mg/dL — ABNORMAL HIGH (ref 6–23)
CO2: 28 mEq/L (ref 19–32)
Calcium: 9.2 mg/dL (ref 8.4–10.5)
Chloride: 103 mEq/L (ref 96–112)
Creatinine, Ser: 0.84 mg/dL (ref 0.40–1.20)
GFR: 70.93 mL/min (ref >60.00)
Glucose, Bld: 120 mg/dL — ABNORMAL HIGH (ref 70–99)
Potassium: 4.5 mEq/L (ref 3.5–5.1)
Sodium: 140 mEq/L (ref 135–145)
Total Bilirubin: 0.4 mg/dL (ref 0.2–1.2)
Total Protein: 7.2 g/dL (ref 6.0–8.3)

## 2022-06-27 LAB — HEMOGLOBIN A1C: Hgb A1c MFr Bld: 6.1 % (ref 4.6–6.5)

## 2022-06-27 LAB — VITAMIN D 25 HYDROXY (VIT D DEFICIENCY, FRACTURES): VITD: 39.6 ng/mL (ref 30.00–100.00)

## 2022-06-27 LAB — TSH: TSH: 0.66 u[IU]/mL (ref 0.35–5.50)

## 2022-08-11 ENCOUNTER — Encounter: Payer: Self-pay | Admitting: Family Medicine

## 2022-08-13 ENCOUNTER — Other Ambulatory Visit: Payer: Self-pay | Admitting: Family Medicine

## 2022-08-13 DIAGNOSIS — Z780 Asymptomatic menopausal state: Secondary | ICD-10-CM

## 2022-08-13 DIAGNOSIS — E2839 Other primary ovarian failure: Secondary | ICD-10-CM

## 2022-08-14 ENCOUNTER — Encounter: Payer: Self-pay | Admitting: Family Medicine

## 2022-08-17 DIAGNOSIS — W19XXXA Unspecified fall, initial encounter: Secondary | ICD-10-CM | POA: Insufficient documentation

## 2022-08-17 NOTE — Assessment & Plan Note (Signed)
Supplement and monitor 

## 2022-08-17 NOTE — Assessment & Plan Note (Signed)
hgba1c acceptable, minimize simple carbs. Increase exercise as tolerated.  

## 2022-08-17 NOTE — Assessment & Plan Note (Signed)
Encourage heart healthy diet such as MIND or DASH diet, increase exercise, avoid trans fats, simple carbohydrates and processed foods, consider a krill or fish or flaxseed oil cap daily.  °

## 2022-08-17 NOTE — Assessment & Plan Note (Signed)
She did suffer a recent fall. She hit her head but she denies any HA or visual changes

## 2022-08-18 ENCOUNTER — Other Ambulatory Visit (HOSPITAL_BASED_OUTPATIENT_CLINIC_OR_DEPARTMENT_OTHER): Payer: Self-pay

## 2022-08-18 ENCOUNTER — Ambulatory Visit: Payer: Medicare HMO | Admitting: Family Medicine

## 2022-08-18 VITALS — BP 118/64 | HR 90 | Temp 98.0°F | Resp 16 | Ht 63.0 in | Wt 123.8 lb

## 2022-08-18 DIAGNOSIS — E860 Dehydration: Secondary | ICD-10-CM | POA: Diagnosis not present

## 2022-08-18 DIAGNOSIS — W19XXXA Unspecified fall, initial encounter: Secondary | ICD-10-CM | POA: Diagnosis not present

## 2022-08-18 DIAGNOSIS — R5383 Other fatigue: Secondary | ICD-10-CM

## 2022-08-18 DIAGNOSIS — R739 Hyperglycemia, unspecified: Secondary | ICD-10-CM | POA: Diagnosis not present

## 2022-08-18 DIAGNOSIS — E559 Vitamin D deficiency, unspecified: Secondary | ICD-10-CM | POA: Diagnosis not present

## 2022-08-18 DIAGNOSIS — E785 Hyperlipidemia, unspecified: Secondary | ICD-10-CM | POA: Diagnosis not present

## 2022-08-18 MED ORDER — VENLAFAXINE HCL ER 75 MG PO CP24
75.0000 mg | ORAL_CAPSULE | Freq: Every day | ORAL | 1 refills | Status: DC
  Filled 2022-08-18: qty 90, 90d supply, fill #0
  Filled 2022-11-17: qty 90, 90d supply, fill #1

## 2022-08-18 NOTE — Progress Notes (Unsigned)
Subjective:    Patient ID: Lauren [name], female    DOB: Aug 20, 1955, 67 y.o.   MRN: [id number]  Chief Complaint  Patient presents with   Follow-up    Follow up    HPI Discussed the use of AI scribe software for clinical note transcription with the patient, who gave verbal consent to proceed.  History of Present Illness   The patient, a 67 year old with a history of depression and hyperglycemia, presents with fatigue and a lack of energy. The patient reports a recent fall, which resulted in a head injury. The patient describes the fall as a result of weakness in the right leg, which had been previously treated with prednisone. The patient also mentions a history of right thigh pain, which was managed with physical therapy. The patient reports feeling weak and tired, and has been experiencing a lack of interest and imagination. The patient also mentions a recent lack of energy and a feeling of being in slow motion. The patient is currently on Effexor for depression and is planning to start therapy in October. The patient also mentions a planned trip to [place] and discusses getting a COVID shot before leaving.        Past Medical History:  Diagnosis Date   Cancer [facility]) 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   Chicken pox as a child   Depression    been on antidepressants on and off for 20 yr   GERD (gastroesophageal reflux disease)    silent with hiatal hernia   Hiatal hernia 05/22/2013   Hiatal hernia with gastroesophageal reflux 05/22/2013   Hot tub folliculitis 12/18/2013   Hyperglycemia 05/19/2016   Hyperlipidemia 05/19/2016   Measles as a child   Postmenopausal atrophic vaginitis 05/22/2013   Preventative health care 01/19/2015   Shingles 67 yrs old   Unspecified vitamin D deficiency 05/22/2013    Past Surgical History:  Procedure Laterality Date   ABDOMINAL HYSTERECTOMY  1998   partial-still has ovaries   BILIARY STENT PLACEMENT N/A 11/23/2019   Procedure: BILIARY STENT PLACEMENT;   Surgeon: Jeani Hawking, MD;  Location: [facility];  Service: Endoscopy;  Laterality: N/A;   ENDOSCOPIC RETROGRADE CHOLANGIOPANCREATOGRAPHY (ERCP) WITH PROPOFOL N/A 06/01/2020   Procedure: ENDOSCOPIC RETROGRADE CHOLANGIOPANCREATOGRAPHY (ERCP) WITH PROPOFOL;  Surgeon: Jeani Hawking, MD;  Location: [facility];  Service: Endoscopy;  Laterality: N/A;   ERCP N/A 11/23/2019   Procedure: ENDOSCOPIC RETROGRADE CHOLANGIOPANCREATOGRAPHY (ERCP);  Surgeon: Jeani Hawking, MD;  Location: [facility];  Service: Endoscopy;  Laterality: N/A;   ESOPHAGECTOMY  05-2008   INDUCED ABORTION  1979   REMOVAL OF STONES  11/23/2019   Procedure: REMOVAL OF STONES;  Surgeon: Jeani Hawking, MD;  Location: [facility];  Service: Endoscopy;;   SPHINCTEROTOMY  11/23/2019   Procedure: Dennison Mascot;  Surgeon: Jeani Hawking, MD;  Location: [facility];  Service: Endoscopy;;   STENT REMOVAL  06/01/2020   Procedure: STENT REMOVAL;  Surgeon: Jeani Hawking, MD;  Location: [facility];  Service: Endoscopy;;    Family History  Problem Relation Age of Onset   Cancer Mother 38       breast cancer   Emphysema Mother        smoker   Heart attack Father        X several   Mental illness Brother        bipolar, xanax and thc abuse   Obesity Brother    Depression Daughter    Anxiety disorder Daughter    Other Maternal Grandmother  blood clot, dvt, lung   Cancer Paternal Grandmother        stomach   Heart disease Paternal Grandfather     Social History   Socioeconomic History   Marital status: Married    Spouse name: Rosanne Ashing   Number of children: Not on file   Years of education: Not on file   Highest education level: Not on file  Occupational History    Employer: [employer]    Comment: Event Planner  Tobacco Use   Smoking status: Former    Current packs/day: 0.10    Average packs/day: 0.1 packs/day for 25.0 years (2.5 ttl pk-yrs)    Types: Cigarettes    Start date: 01/13/1998   Smokeless  tobacco: Never   Tobacco comments:    1-2 cigarettes a day  Vaping Use   Vaping status: Never Used  Substance and Sexual Activity   Alcohol use: Yes    Comment: a couple glasses of wine every other day   Drug use: No   Sexual activity: Yes    Partners: Male    Comment: lives with husband, event planner, no dietary restrictions  Other Topics Concern   Not on file  Social History Narrative   Married, husband Pharmacist, hospital   Social Determinants of Health   Financial Resource Strain: Low Risk  (06/05/2021)   Overall Financial Resource Strain (CARDIA)    Difficulty of Paying Living Expenses: Not hard at all  Food Insecurity: No Food Insecurity (06/12/2022)   Hunger Vital Sign    Worried About Running Out of Food in the Last Year: Never true    Ran Out of Food in the Last Year: Never true  Transportation Needs: No Transportation Needs (06/12/2022)   PRAPARE - Administrator, Civil Service (Medical): No    Lack of Transportation (Non-Medical): No  Physical Activity: Insufficiently Active (06/05/2021)   Exercise Vital Sign    Days of Exercise per Week: 3 days    Minutes of Exercise per Session: 40 min  Stress: No Stress Concern Present (06/05/2021)   [employer] of Occupational Health - Occupational Stress Questionnaire    Feeling of Stress : Not at all  Social Connections: Moderately Integrated (06/05/2021)   Social Connection and Isolation Panel [NHANES]    Frequency of Communication with Friends and Family: More than three times a week    Frequency of Social Gatherings with Friends and Family: More than three times a week    Attends Religious Services: Never    Database administrator or Organizations: Yes    Attends Engineer, structural: More than 4 times per year    Marital Status: Married  Catering manager Violence: Not At Risk (06/12/2022)   Humiliation, Afraid, Rape, and Kick questionnaire    Fear of Current or Ex-Partner: No    Emotionally  Abused: No    Physically Abused: No    Sexually Abused: No    Outpatient Medications Prior to Visit  Medication Sig Dispense Refill   Ascorbic Acid (VITAMIN C) 1000 MG tablet Take 1,000 mg by mouth daily.     Cholecalciferol (VITAMIN D) 50 MCG (2000 UT) CAPS Take by mouth.     gabapentin (NEURONTIN) 300 MG capsule Take by mouth.     methylPREDNISolone (MEDROL) 4 MG tablet 5 tabs po x 1 day then 4 tabs po x 1 day then 3 tabs po x 1 day then 2 tabs po x 1 day then  1 tab po x 1 day and stop 15 tablet 0   omeprazole (PRILOSEC) 20 MG capsule TAKE 1 CAPSULE BY MOUTH EVERY DAY 90 capsule 3   rosuvastatin (CRESTOR) 5 MG tablet TAKE 1 TABLET BY MOUTH EVERY DAY 90 tablet 0   tiZANidine (ZANAFLEX) 2 MG tablet Take 0.5-2 tablets (1-4 mg total) by mouth every 8 (eight) hours as needed for muscle spasms. 30 tablet 1   venlafaxine XR (EFFEXOR-XR) 150 MG 24 hr capsule TAKE 1 CAPSULE BY MOUTH EVERY DAY WITH BREAKFAST 90 capsule 3   No facility-administered medications prior to visit.    Allergies  Allergen Reactions   Penicillins Hives and Nausea And Vomiting    PCN - Hives when she was 14 Amoxicillin - vomiting and chills (11/22/19)      Review of Systems  Constitutional:  Negative for fever and malaise/fatigue.  HENT:  Negative for congestion.   Eyes:  Negative for blurred vision.  Respiratory:  Negative for shortness of breath.   Cardiovascular:  Negative for chest pain, palpitations and leg swelling.  Gastrointestinal:  Negative for abdominal pain, blood in stool and nausea.  Genitourinary:  Negative for dysuria and frequency.  Musculoskeletal:  Positive for joint pain and myalgias. Negative for falls.  Skin:  Negative for rash.  Neurological:  Negative for dizziness, loss of consciousness and headaches.  Endo/Heme/Allergies:  Negative for environmental allergies.  Psychiatric/Behavioral:  Negative for depression. The patient is not nervous/anxious.        Objective:    Physical  Exam Constitutional:      General: She is not in acute distress.    Appearance: Normal appearance. She is well-developed. She is not toxic-appearing.  HENT:     Head: Normocephalic and atraumatic.     Right Ear: External ear normal.     Left Ear: External ear normal.     Nose: Nose normal.  Eyes:     General:        Right eye: No discharge.        Left eye: No discharge.     Conjunctiva/sclera: Conjunctivae normal.  Neck:     Thyroid: No thyromegaly.  Cardiovascular:     Rate and Rhythm: Normal rate and regular rhythm.     Heart sounds: Normal heart sounds. No murmur heard. Pulmonary:     Effort: Pulmonary effort is normal. No respiratory distress.     Breath sounds: Normal breath sounds.  Abdominal:     General: Bowel sounds are normal.     Palpations: Abdomen is soft.     Tenderness: There is no abdominal tenderness. There is no guarding.  Musculoskeletal:        General: Normal range of motion.     Cervical back: Neck supple.  Lymphadenopathy:     Cervical: No cervical adenopathy.  Skin:    General: Skin is warm and dry.  Neurological:     Mental Status: She is alert and oriented to person, place, and time.  Psychiatric:        Mood and Affect: Mood normal.        Behavior: Behavior normal.        Thought Content: Thought content normal.        Judgment: Judgment normal.    BP 118/64 (BP Location: Left Arm, Patient Position: Sitting, Cuff Size: Normal)   Pulse 90   Temp 98 F (36.7 C) (Oral)   Resp 16   Ht 5\' 3"  (1.6 m)   Wt 123  lb 12.8 oz (56.2 kg)   SpO2 95%   BMI 21.93 kg/m  Wt Readings from Last 3 Encounters:  08/18/22 123 lb 12.8 oz (56.2 kg)  06/26/22 123 lb 3.2 oz (55.9 kg)  07/25/21 119 lb (54 kg)    Diabetic Foot Exam - Simple   No data filed    Lab Results  Component Value Date   WBC 8.6 06/26/2022   HGB 13.5 06/26/2022   HCT 42.0 06/26/2022   PLT 312.0 06/26/2022   GLUCOSE 96 08/18/2022   CHOL 169 06/26/2022   TRIG 78.0 06/26/2022    HDL 71.10 06/26/2022   LDLCALC 83 06/26/2022   ALT 18 08/18/2022   AST 23 08/18/2022   NA 141 08/18/2022   K 4.8 08/18/2022   CL 100 08/18/2022   CREATININE 1.00 08/18/2022   BUN 20 08/18/2022   CO2 30 08/18/2022   TSH 0.66 06/26/2022   INR 1.0 05/04/2008   HGBA1C 6.1 06/26/2022    Lab Results  Component Value Date   TSH 0.66 06/26/2022   Lab Results  Component Value Date   WBC 8.6 06/26/2022   HGB 13.5 06/26/2022   HCT 42.0 06/26/2022   MCV 97.3 06/26/2022   PLT 312.0 06/26/2022   Lab Results  Component Value Date   NA 141 08/18/2022   K 4.8 08/18/2022   CO2 30 08/18/2022   GLUCOSE 96 08/18/2022   BUN 20 08/18/2022   CREATININE 1.00 08/18/2022   BILITOT 0.4 08/18/2022   ALKPHOS 82 08/18/2022   AST 23 08/18/2022   ALT 18 08/18/2022   PROT 7.2 08/18/2022   ALBUMIN 4.5 08/18/2022   CALCIUM 10.4 08/18/2022   ANIONGAP 8 11/24/2019   GFR 57.48 (L) 08/18/2022   Lab Results  Component Value Date   CHOL 169 06/26/2022   Lab Results  Component Value Date   HDL 71.10 06/26/2022   Lab Results  Component Value Date   LDLCALC 83 06/26/2022   Lab Results  Component Value Date   TRIG 78.0 06/26/2022   Lab Results  Component Value Date   CHOLHDL 2 06/26/2022   Lab Results  Component Value Date   HGBA1C 6.1 06/26/2022       Assessment & Plan:  Hyperglycemia Assessment & Plan: hgba1c acceptable, minimize simple carbs. Increase exercise as tolerated.   Orders: -     T4, free -     T3, free -     Thyroid peroxidase antibody  Hyperlipidemia, unspecified hyperlipidemia type Assessment & Plan: Encourage heart healthy diet such as MIND or DASH diet, increase exercise, avoid trans fats, simple carbohydrates and processed foods, consider a krill or fish or flaxseed oil cap daily.   Orders: -     Thyroid peroxidase antibody  Vitamin D deficiency Assessment & Plan: Supplement and monitor    Fall, initial encounter Assessment & Plan: She did suffer a  recent fall. She hit her head but she denies any HA or visual changes   Dehydration -     T4, free -     T3, free -     Comprehensive metabolic panel  Fatigue, unspecified type -     Thyroid peroxidase antibody  Other orders -     Venlafaxine HCl ER; Take 1 capsule (75 mg total) by mouth daily with breakfast. Total daily dose of 225  Dispense: 90 capsule; Refill: 1    Assessment and Plan    Right Thigh Pain Resolved with prednisone but recurred. Physical therapy ongoing.  Possible sciatica or groin issue. Contributed to a fall. -Continue physical therapy with Britt. -Practice stair climbing for strength and balance.  Fall Risk Recent fall with head injury and minor abrasions. No significant sequelae. Discussed the importance of using handrails and staying in the moment to prevent falls. -Continue using handrails and practicing mindfulness when moving.  Fatigue Persistent fatigue with possible contributing factors including heat, political stress, and recent fall. Family history of thyroid issues. -Check free T3, free T4, and thyroid peroxidase antibody. -Consider increasing Effexor dose from 150mg  to 225mg  daily.  Prediabetes Hemoglobin A1c of 6.1 in June. Discussed the importance of diet and hydration in managing blood sugar levels. -Maintain diet low in simple carbs and high in protein. -Stay hydrated, aiming for 10 ounces of water every 1-2 hours.  General Health Maintenance -Plan to receive COVID-19 booster and flu shot before traveling to [place]. -Check kidney markers due to slight elevation in previous labs. -Schedule follow-up appointments in 3 months (virtual) and 6 months (comprehensive physical exam).         Danise Edge, MD

## 2022-08-18 NOTE — Patient Instructions (Signed)
Fatigue If you have fatigue, you feel tired all the time and have a lack of energy or a lack of motivation. Fatigue may make it difficult to start or complete tasks because of exhaustion. Occasional or mild fatigue is often a normal response to activity or life. However, long-term (chronic) or extreme fatigue may be a symptom of a medical condition such as: Depression. Not having enough red blood cells or hemoglobin in the blood (anemia). A problem with a small gland located in the lower front part of the neck (thyroid disorder). Rheumatologic conditions. These are problems related to the body's defense system (immune system). Infections, especially certain viral infections. Fatigue can also lead to negative health outcomes over time. Follow these instructions at home: Medicines Take over-the-counter and prescription medicines only as told by your health care provider. Take a multivitamin if told by your health care provider. Do not use herbal or dietary supplements unless they are approved by your health care provider. Eating and drinking  Avoid heavy meals in the evening. Eat a well-balanced diet, which includes lean proteins, whole grains, plenty of fruits and vegetables, and low-fat dairy products. Avoid eating or drinking too many products with caffeine in them. Avoid alcohol. Drink enough fluid to keep your urine pale yellow. Activity  Exercise regularly, as told by your health care provider. Use or practice techniques to help you relax, such as yoga, tai chi, meditation, or massage therapy. Lifestyle Change situations that cause you stress. Try to keep your work and personal schedules in balance. Do not use recreational or illegal drugs. General instructions Monitor your fatigue for any changes. Go to bed and get up at the same time every day. Avoid fatigue by pacing yourself during the day and getting enough sleep at night. Maintain a healthy weight. Contact a health care  provider if: Your fatigue does not get better. You have a fever. You suddenly lose or gain weight. You have headaches. You have trouble falling asleep or sleeping through the night. You feel angry, guilty, anxious, or sad. You have swelling in your legs or another part of your body. Get help right away if: You feel confused, feel like you might faint, or faint. Your vision is blurry or you have a severe headache. You have severe pain in your abdomen, your back, or the area between your waist and hips (pelvis). You have chest pain, shortness of breath, or an irregular or fast heartbeat. You are unable to urinate, or you urinate less than normal. You have abnormal bleeding from the rectum, nose, lungs, nipples, or, if you are female, the vagina. You vomit blood. You have thoughts about hurting yourself or others. These symptoms may be an emergency. Get help right away. Call 911. Do not wait to see if the symptoms will go away. Do not drive yourself to the hospital. Get help right away if you feel like you may hurt yourself or others, or have thoughts about taking your own life. Go to your nearest emergency room or: Call 911. Call the National Suicide Prevention Lifeline at 1-800-273-8255 or 988. This is open 24 hours a day. Text the Crisis Text Line at 741741. Summary If you have fatigue, you feel tired all the time and have a lack of energy or a lack of motivation. Fatigue may make it difficult to start or complete tasks because of exhaustion. Long-term (chronic) or extreme fatigue may be a symptom of a medical condition. Exercise regularly, as told by your health care provider.   Change situations that cause you stress. Try to keep your work and personal schedules in balance. This information is not intended to replace advice given to you by your health care provider. Make sure you discuss any questions you have with your health care provider. Document Revised: 10/22/2020 Document  Reviewed: 10/22/2020 Elsevier Patient Education  2024 [employer]  

## 2022-09-22 ENCOUNTER — Other Ambulatory Visit: Payer: Self-pay | Admitting: Family Medicine

## 2022-09-23 ENCOUNTER — Ambulatory Visit (HOSPITAL_BASED_OUTPATIENT_CLINIC_OR_DEPARTMENT_OTHER)
Admission: RE | Admit: 2022-09-23 | Discharge: 2022-09-23 | Disposition: A | Discharge status: Home / Self Care | Payer: Medicare HMO | Source: Ambulatory Visit | Attending: Family Medicine | Admitting: Family Medicine

## 2022-09-23 DIAGNOSIS — Z780 Asymptomatic menopausal state: Secondary | ICD-10-CM | POA: Diagnosis not present

## 2022-09-23 DIAGNOSIS — E2839 Other primary ovarian failure: Secondary | ICD-10-CM | POA: Diagnosis not present

## 2022-09-23 DIAGNOSIS — M85831 Other specified disorders of bone density and structure, right forearm: Secondary | ICD-10-CM | POA: Diagnosis not present

## 2022-09-23 DIAGNOSIS — M8580 Other specified disorders of bone density and structure, unspecified site: Secondary | ICD-10-CM

## 2022-09-23 HISTORY — DX: Other specified disorders of bone density and structure, unspecified site: M85.80

## 2022-09-23 MED ORDER — ROSUVASTATIN CALCIUM 5 MG PO TABS: 5.0000 mg | ORAL_TABLET | Freq: Every day | ORAL | 1 refills | Status: DC

## 2022-09-24 ENCOUNTER — Encounter: Payer: Self-pay | Admitting: Family Medicine

## 2022-09-24 DIAGNOSIS — K219 Gastro-esophageal reflux disease without esophagitis: Secondary | ICD-10-CM

## 2022-09-24 MED ORDER — OMEPRAZOLE 20 MG PO CPDR: 20.0000 mg | DELAYED_RELEASE_CAPSULE | Freq: Every day | ORAL | 1 refills | Status: DC

## 2022-10-17 DIAGNOSIS — Z8501 Personal history of malignant neoplasm of esophagus: Secondary | ICD-10-CM | POA: Insufficient documentation

## 2022-10-17 DIAGNOSIS — K529 Noninfective gastroenteritis and colitis, unspecified: Secondary | ICD-10-CM | POA: Insufficient documentation

## 2022-10-17 DIAGNOSIS — K222 Esophageal obstruction: Secondary | ICD-10-CM | POA: Insufficient documentation

## 2022-10-17 DIAGNOSIS — R1310 Dysphagia, unspecified: Secondary | ICD-10-CM | POA: Insufficient documentation

## 2022-10-27 DIAGNOSIS — F331 Major depressive disorder, recurrent, moderate: Secondary | ICD-10-CM | POA: Diagnosis not present

## 2022-11-13 DIAGNOSIS — F331 Major depressive disorder, recurrent, moderate: Secondary | ICD-10-CM | POA: Diagnosis not present

## 2022-11-17 ENCOUNTER — Other Ambulatory Visit: Payer: Self-pay | Admitting: Family Medicine

## 2022-11-17 ENCOUNTER — Encounter: Payer: Self-pay | Admitting: Family Medicine

## 2022-11-17 MED ORDER — HYOSCYAMINE SULFATE 0.125 MG SL SUBL: 0.1250 mg | SUBLINGUAL_TABLET | SUBLINGUAL | 1 refills | Status: DC | PRN

## 2022-11-18 ENCOUNTER — Other Ambulatory Visit: Payer: Self-pay

## 2022-11-19 NOTE — Telephone Encounter (Signed)
Patient has been scheduled for VV appointment on 12/15/2022 @ 9 am

## 2022-11-20 ENCOUNTER — Encounter: Payer: Self-pay | Admitting: Family Medicine

## 2022-11-21 ENCOUNTER — Other Ambulatory Visit (HOSPITAL_BASED_OUTPATIENT_CLINIC_OR_DEPARTMENT_OTHER): Payer: Self-pay

## 2022-11-21 ENCOUNTER — Other Ambulatory Visit: Payer: Self-pay

## 2022-11-25 ENCOUNTER — Other Ambulatory Visit (HOSPITAL_BASED_OUTPATIENT_CLINIC_OR_DEPARTMENT_OTHER): Payer: Self-pay

## 2022-12-01 DIAGNOSIS — F331 Major depressive disorder, recurrent, moderate: Secondary | ICD-10-CM | POA: Diagnosis not present

## 2022-12-14 NOTE — Assessment & Plan Note (Signed)
Encourage heart healthy diet such as MIND or DASH diet, increase exercise, avoid trans fats, simple carbohydrates and processed foods, consider a krill or fish or flaxseed oil cap daily.  °

## 2022-12-14 NOTE — Assessment & Plan Note (Signed)
Doing well, continue Venlafaxine

## 2022-12-14 NOTE — Assessment & Plan Note (Signed)
hgba1c acceptable, minimize simple carbs. Increase exercise as tolerated.  

## 2022-12-14 NOTE — Assessment & Plan Note (Signed)
Avoid offending foods, start probiotics. Do not eat large meals in late evening and consider raising head of bed.  Was having some episodes of RUQ pain intermittently. Used the Hyoscyamine and it was helpful but since having some dry needling and body work done at [facility] her pain has resolved. She will notify us for further evaluation

## 2022-12-14 NOTE — Assessment & Plan Note (Signed)
Supplement and monitor 

## 2022-12-15 ENCOUNTER — Encounter: Payer: Self-pay | Admitting: Family Medicine

## 2022-12-15 ENCOUNTER — Telehealth (INDEPENDENT_AMBULATORY_CARE_PROVIDER_SITE_OTHER): Payer: Medicare HMO | Admitting: Family Medicine

## 2022-12-15 DIAGNOSIS — N952 Postmenopausal atrophic vaginitis: Secondary | ICD-10-CM

## 2022-12-15 DIAGNOSIS — Z780 Asymptomatic menopausal state: Secondary | ICD-10-CM | POA: Diagnosis not present

## 2022-12-15 DIAGNOSIS — K219 Gastro-esophageal reflux disease without esophagitis: Secondary | ICD-10-CM

## 2022-12-15 DIAGNOSIS — F339 Major depressive disorder, recurrent, unspecified: Secondary | ICD-10-CM

## 2022-12-15 DIAGNOSIS — E785 Hyperlipidemia, unspecified: Secondary | ICD-10-CM

## 2022-12-15 DIAGNOSIS — E2839 Other primary ovarian failure: Secondary | ICD-10-CM | POA: Diagnosis not present

## 2022-12-15 DIAGNOSIS — R5383 Other fatigue: Secondary | ICD-10-CM

## 2022-12-15 DIAGNOSIS — E559 Vitamin D deficiency, unspecified: Secondary | ICD-10-CM | POA: Diagnosis not present

## 2022-12-15 DIAGNOSIS — K449 Diaphragmatic hernia without obstruction or gangrene: Secondary | ICD-10-CM | POA: Diagnosis not present

## 2022-12-15 DIAGNOSIS — R739 Hyperglycemia, unspecified: Secondary | ICD-10-CM | POA: Diagnosis not present

## 2022-12-15 DIAGNOSIS — F331 Major depressive disorder, recurrent, moderate: Secondary | ICD-10-CM | POA: Diagnosis not present

## 2022-12-15 NOTE — Progress Notes (Signed)
MyChart Video Visit    Virtual Visit via Video Note   This patient is at least at moderate risk for complications without adequate follow up. This format is felt to be most appropriate for this patient at this time. Physical exam was limited by quality of the video and audio technology used for the visit. Juanetta, CMA was able to get the patient set up on a video visit.  Patient location: home Patient and provider in visit Provider location: Office  I discussed the limitations of evaluation and management by telemedicine and the availability of in person appointments. The patient expressed understanding and agreed to proceed.  Visit Date: 12/15/2022  Today's healthcare provider: Danise Edge, MD     Subjective:    Patient ID: Lauren [name], female    DOB: 1955/07/14, 67 y.o.   MRN: [id number]  No chief complaint on file.   HPI Patient is in today for follow up on chronic medical concerns. She has not had any further falls, she denies any recent febrile illness or hospitalizations. She is fully retired and staying active. She eats well and is sleeping better. Was having some episodes of RUQ pain intermittently. Used the Hyoscyamine and it was helpful but since having some dry needling and body work done at [facility] her pain has resolved. Denies CP/palp/SOB/HA/congestion/fevers/GI or GU c/o. 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   Chicken pox as a child   Depression    been on antidepressants on and off for 20 yr   GERD (gastroesophageal reflux disease)    silent with hiatal hernia   Hiatal hernia 05/22/2013   Hiatal hernia with gastroesophageal reflux 05/22/2013   Hot tub folliculitis 12/18/2013   Hyperglycemia 05/19/2016   Hyperlipidemia 05/19/2016   Measles as a child   Postmenopausal atrophic vaginitis 05/22/2013   Preventative health care 01/19/2015   Shingles 67 yrs old   Unspecified vitamin D deficiency  05/22/2013    Past Surgical History:  Procedure Laterality Date   ABDOMINAL HYSTERECTOMY  1998   partial-still has ovaries   BILIARY STENT PLACEMENT N/A 11/23/2019   Procedure: BILIARY STENT PLACEMENT;  Surgeon: Jeani Hawking, MD;  Location: [facility];  Service: Endoscopy;  Laterality: N/A;   ENDOSCOPIC RETROGRADE CHOLANGIOPANCREATOGRAPHY (ERCP) WITH PROPOFOL N/A 06/01/2020   Procedure: ENDOSCOPIC RETROGRADE CHOLANGIOPANCREATOGRAPHY (ERCP) WITH PROPOFOL;  Surgeon: Jeani Hawking, MD;  Location: [facility];  Service: Endoscopy;  Laterality: N/A;   ERCP N/A 11/23/2019   Procedure: ENDOSCOPIC RETROGRADE CHOLANGIOPANCREATOGRAPHY (ERCP);  Surgeon: Jeani Hawking, MD;  Location: [facility];  Service: Endoscopy;  Laterality: N/A;   ESOPHAGECTOMY  05-2008   INDUCED ABORTION  1979   REMOVAL OF STONES  11/23/2019   Procedure: REMOVAL OF STONES;  Surgeon: Jeani Hawking, MD;  Location: [facility];  Service: Endoscopy;;   SPHINCTEROTOMY  11/23/2019   Procedure: Dennison Mascot;  Surgeon: Jeani Hawking, MD;  Location: [facility];  Service: Endoscopy;;   STENT REMOVAL  06/01/2020   Procedure: STENT REMOVAL;  Surgeon: Jeani Hawking, MD;  Location: [facility];  Service: Endoscopy;;    Family History  Problem Relation Age of Onset   Cancer Mother 90       breast cancer   Emphysema Mother        smoker   Heart attack Father        X several   Mental illness Brother  bipolar, xanax and thc abuse   Obesity Brother    Depression Daughter    Anxiety disorder Daughter    Other Maternal Grandmother        blood clot, dvt, lung   Cancer Paternal Grandmother        stomach   Heart disease Paternal Grandfather     Social History   Socioeconomic History   Marital status: Married    Spouse name: Rosanne Ashing   Number of children: Not on file   Years of education: Not on file   Highest education level: Not on file  Occupational History    Employer: [employer]    Comment: Event  Planner  Tobacco Use   Smoking status: Former    Current packs/day: 0.10    Average packs/day: 0.1 packs/day for 25.0 years (2.5 ttl pk-yrs)    Types: Cigarettes    Start date: 01/13/1998   Smokeless tobacco: Never   Tobacco comments:    1-2 cigarettes a day  Vaping Use   Vaping status: Never Used  Substance and Sexual Activity   Alcohol use: Yes    Comment: a couple glasses of wine every other day   Drug use: No   Sexual activity: Yes    Partners: Male    Comment: lives with husband, event planner, no dietary restrictions  Other Topics Concern   Not on file  Social History Narrative   Married, husband Pharmacist, hospital   Social Determinants of Health   Financial Resource Strain: Low Risk  (06/05/2021)   Overall Financial Resource Strain (CARDIA)    Difficulty of Paying Living Expenses: Not hard at all  Food Insecurity: No Food Insecurity (06/12/2022)   Hunger Vital Sign    Worried About Running Out of Food in the Last Year: Never true    Ran Out of Food in the Last Year: Never true  Transportation Needs: No Transportation Needs (06/12/2022)   PRAPARE - Administrator, Civil Service (Medical): No    Lack of Transportation (Non-Medical): No  Physical Activity: Insufficiently Active (06/05/2021)   Exercise Vital Sign    Days of Exercise per Week: 3 days    Minutes of Exercise per Session: 40 min  Stress: No Stress Concern Present (06/05/2021)   [employer] of Occupational Health - Occupational Stress Questionnaire    Feeling of Stress : Not at all  Social Connections: Moderately Integrated (06/05/2021)   Social Connection and Isolation Panel [NHANES]    Frequency of Communication with Friends and Family: More than three times a week    Frequency of Social Gatherings with Friends and Family: More than three times a week    Attends Religious Services: Never    Database administrator or Organizations: Yes    Attends Engineer, structural: More than 4  times per year    Marital Status: Married  Catering manager Violence: Not At Risk (06/12/2022)   Humiliation, Afraid, Rape, and Kick questionnaire    Fear of Current or Ex-Partner: No    Emotionally Abused: No    Physically Abused: No    Sexually Abused: No    Outpatient Medications Prior to Visit  Medication Sig Dispense Refill   Ascorbic Acid (VITAMIN C) 1000 MG tablet Take 1,000 mg by mouth daily.     Cholecalciferol (VITAMIN D) 50 MCG (2000 UT) CAPS Take by mouth.     gabapentin (NEURONTIN) 300 MG capsule Take by mouth.  hyoscyamine (LEVSIN SL) 0.125 MG SL tablet Place 1 tablet (0.125 mg total) under the tongue every 4 (four) hours as needed. 30 tablet 1   methylPREDNISolone (MEDROL) 4 MG tablet 5 tabs po x 1 day then 4 tabs po x 1 day then 3 tabs po x 1 day then 2 tabs po x 1 day then 1 tab po x 1 day and stop 15 tablet 0   omeprazole (PRILOSEC) 20 MG capsule Take 1 capsule (20 mg total) by mouth daily. 90 capsule 1   rosuvastatin (CRESTOR) 5 MG tablet Take 1 tablet (5 mg total) by mouth daily. 90 tablet 1   tiZANidine (ZANAFLEX) 2 MG tablet Take 0.5-2 tablets (1-4 mg total) by mouth every 8 (eight) hours as needed for muscle spasms. 30 tablet 1   venlafaxine XR (EFFEXOR XR) 75 MG 24 hr capsule Take 1 capsule (75 mg total) by mouth daily with breakfast. Total daily dose of 225 90 capsule 1   venlafaxine XR (EFFEXOR-XR) 150 MG 24 hr capsule TAKE 1 CAPSULE BY MOUTH EVERY DAY WITH BREAKFAST 90 capsule 3   No facility-administered medications prior to visit.    Allergies  Allergen Reactions   Penicillins Hives and Nausea And Vomiting    PCN - Hives when she was 14 Amoxicillin - vomiting and chills (11/22/19)      Review of Systems  Constitutional:  Positive for malaise/fatigue. Negative for fever.  HENT:  Negative for congestion.   Eyes:  Negative for blurred vision.  Respiratory:  Negative for shortness of breath.   Cardiovascular:  Negative for chest pain, palpitations and  leg swelling.  Gastrointestinal:  Negative for abdominal pain, blood in stool and nausea.  Genitourinary:  Negative for dysuria and frequency.  Musculoskeletal:  Positive for joint pain and myalgias. Negative for falls.  Skin:  Negative for rash.  Neurological:  Negative for dizziness, loss of consciousness and headaches.  Endo/Heme/Allergies:  Negative for environmental allergies.  Psychiatric/Behavioral:  Negative for depression. The patient is not nervous/anxious.        Objective:    Physical Exam Constitutional:      General: She is not in acute distress.    Appearance: Normal appearance. She is not ill-appearing or toxic-appearing.  HENT:     Head: Normocephalic and atraumatic.     Right Ear: External ear normal.     Left Ear: External ear normal.     Nose: Nose normal.  Eyes:     General:        Right eye: No discharge.        Left eye: No discharge.  Pulmonary:     Effort: Pulmonary effort is normal.  Skin:    Findings: No rash.  Neurological:     Mental Status: She is alert and oriented to person, place, and time.  Psychiatric:        Behavior: Behavior normal.     There were no vitals taken for this visit. Wt Readings from Last 3 Encounters:  08/18/22 123 lb 12.8 oz (56.2 kg)  06/26/22 123 lb 3.2 oz (55.9 kg)  07/25/21 119 lb (54 kg)       Assessment & Plan:  Hyperglycemia Assessment & Plan: hgba1c acceptable, minimize simple carbs. Increase exercise as tolerated.   Orders: -     Hemoglobin A1c; Future -     TSH; Future  Hyperlipidemia, unspecified hyperlipidemia type Assessment & Plan: Encourage heart healthy diet such as MIND or DASH diet, increase exercise, avoid trans  fats, simple carbohydrates and processed foods, consider a krill or fish or flaxseed oil cap daily.   Orders: -     Lipid panel; Future -     Comprehensive metabolic panel; Future -     TSH; Future  Vitamin D deficiency Assessment & Plan: Supplement and monitor    Orders: -     VITAMIN D 25 Hydroxy (Vit-D Deficiency, Fractures); Future  Hiatal hernia with gastroesophageal reflux Assessment & Plan: Avoid offending foods, start probiotics. Do not eat large meals in late evening and consider raising head of bed.  Was having some episodes of RUQ pain intermittently. Used the Hyoscyamine and it was helpful but since having some dry needling and body work done at [facility] her pain has resolved. She will notify us for further evaluation  Orders: -     CBC with Differential/Platelet; Future -     TSH; Future  Recurrent major depressive disorder, remission status unspecified (HCC) Assessment & Plan: Doing well, continue Venlafaxine   Post-menopause -     Estrogens, total; Future -     17-Hydroxyprogesterone; Future -     Testosterone; Future  Other fatigue -     Estrogens, total; Future -     17-Hydroxyprogesterone; Future -     Testosterone; Future  Estrogen deficiency -     Estrogens, total; Future -     17-Hydroxyprogesterone; Future -     Testosterone; Future  Postmenopausal atrophic vaginitis Assessment & Plan: Patient is considering bio identical hormone therapy to help with fatigue, pain, mental clarity and other physical symptoms. Will check hormone levels and she will consider working with a specialist in this area.       I discussed the assessment and treatment plan with the patient. The patient was provided an opportunity to ask questions and all were answered. The patient agreed with the plan and demonstrated an understanding of the instructions.   The patient was advised to call back or seek an in-person evaluation if the symptoms worsen or if the condition fails to improve as anticipated.  Danise Edge, MD [facility] [phone] (phone) [phone] (fax)  [facility]

## 2022-12-15 NOTE — Assessment & Plan Note (Signed)
Patient is considering bio identical hormone therapy to help with fatigue, pain, mental clarity and other physical symptoms. Will check hormone levels and she will consider working with a specialist in this area.

## 2022-12-23 ENCOUNTER — Other Ambulatory Visit (INDEPENDENT_AMBULATORY_CARE_PROVIDER_SITE_OTHER): Payer: Medicare HMO

## 2022-12-23 DIAGNOSIS — E785 Hyperlipidemia, unspecified: Secondary | ICD-10-CM | POA: Diagnosis not present

## 2022-12-23 DIAGNOSIS — E2839 Other primary ovarian failure: Secondary | ICD-10-CM | POA: Diagnosis not present

## 2022-12-23 DIAGNOSIS — Z780 Asymptomatic menopausal state: Secondary | ICD-10-CM

## 2022-12-23 DIAGNOSIS — E559 Vitamin D deficiency, unspecified: Secondary | ICD-10-CM | POA: Diagnosis not present

## 2022-12-23 DIAGNOSIS — R5383 Other fatigue: Secondary | ICD-10-CM | POA: Diagnosis not present

## 2022-12-23 DIAGNOSIS — R739 Hyperglycemia, unspecified: Secondary | ICD-10-CM

## 2022-12-23 DIAGNOSIS — K449 Diaphragmatic hernia without obstruction or gangrene: Secondary | ICD-10-CM

## 2022-12-23 DIAGNOSIS — K219 Gastro-esophageal reflux disease without esophagitis: Secondary | ICD-10-CM | POA: Diagnosis not present

## 2022-12-23 LAB — COMPREHENSIVE METABOLIC PANEL
ALT: 20 U/L (ref 0–35)
AST: 26 U/L (ref 0–37)
Albumin: 4.6 g/dL (ref 3.5–5.2)
Alkaline Phosphatase: 91 U/L (ref 39–117)
BUN: 17 mg/dL (ref 6–23)
CO2: 30 meq/L (ref 19–32)
Calcium: 9.8 mg/dL (ref 8.4–10.5)
Chloride: 100 meq/L (ref 96–112)
Creatinine, Ser: 0.87 mg/dL (ref 0.40–1.20)
GFR: 67.77 mL/min (ref >60.00)
Glucose, Bld: 123 mg/dL — ABNORMAL HIGH (ref 70–99)
Potassium: 4.1 meq/L (ref 3.5–5.1)
Sodium: 139 meq/L (ref 135–145)
Total Bilirubin: 0.4 mg/dL (ref 0.2–1.2)
Total Protein: 7.8 g/dL (ref 6.0–8.3)

## 2022-12-23 LAB — CBC WITH DIFFERENTIAL/PLATELET
Basophils Absolute: 0 10*3/uL (ref 0.0–0.1)
Basophils Relative: 1 % (ref 0.0–3.0)
Eosinophils Absolute: 0.1 10*3/uL (ref 0.0–0.7)
Eosinophils Relative: 2.9 % (ref 0.0–5.0)
HCT: 43.4 % (ref 36.0–46.0)
Hemoglobin: 14.2 g/dL (ref 12.0–15.0)
Lymphocytes Relative: 28.9 % (ref 12.0–46.0)
Lymphs Abs: 1.4 10*3/uL (ref 0.7–4.0)
MCHC: 32.7 g/dL (ref 30.0–36.0)
MCV: 96.3 fL (ref 78.0–100.0)
Monocytes Absolute: 0.4 10*3/uL (ref 0.1–1.0)
Monocytes Relative: 9.6 % (ref 3.0–12.0)
Neutro Abs: 2.7 10*3/uL (ref 1.4–7.7)
Neutrophils Relative %: 57.6 % (ref 43.0–77.0)
Platelets: 348 10*3/uL (ref 150.0–400.0)
RBC: 4.5 Mil/uL (ref 3.87–5.11)
RDW: 14.3 % (ref 11.5–15.5)
WBC: 4.7 10*3/uL (ref 4.0–10.5)

## 2022-12-23 LAB — LIPID PANEL
Cholesterol: 196 mg/dL (ref 0–200)
HDL: 74.8 mg/dL (ref >39.00)
LDL Cholesterol: 105 mg/dL — ABNORMAL HIGH (ref 0–99)
NonHDL: 121.58
Total CHOL/HDL Ratio: 3
Triglycerides: 85 mg/dL (ref 0.0–149.0)
VLDL: 17 mg/dL (ref 0.0–40.0)

## 2022-12-23 LAB — HEMOGLOBIN A1C: Hgb A1c MFr Bld: 6.2 % (ref 4.6–6.5)

## 2022-12-23 LAB — VITAMIN D 25 HYDROXY (VIT D DEFICIENCY, FRACTURES): VITD: 45.69 ng/mL (ref 30.00–100.00)

## 2022-12-23 LAB — TESTOSTERONE: Testosterone: 31.74 ng/dL (ref 15.00–40.00)

## 2022-12-23 LAB — TSH: TSH: 1.12 u[IU]/mL (ref 0.35–5.50)

## 2022-12-28 LAB — ESTROGENS, TOTAL: Estrogen: 49 pg/mL

## 2022-12-28 LAB — 17-HYDROXYPROGESTERONE: 17-OH-Progesterone, LC/MS/MS: 11 ng/dL

## 2023-01-12 DIAGNOSIS — F331 Major depressive disorder, recurrent, moderate: Secondary | ICD-10-CM | POA: Diagnosis not present

## 2023-01-26 ENCOUNTER — Emergency Department (HOSPITAL_BASED_OUTPATIENT_CLINIC_OR_DEPARTMENT_OTHER): Payer: Medicare HMO

## 2023-01-26 ENCOUNTER — Inpatient Hospital Stay (HOSPITAL_BASED_OUTPATIENT_CLINIC_OR_DEPARTMENT_OTHER)
Admission: EM | Admit: 2023-01-26 | Discharge: 2023-02-05 | DRG: 356 | Disposition: A | Discharge status: Home / Self Care | Payer: Medicare HMO | Attending: General Surgery | Admitting: General Surgery

## 2023-01-26 ENCOUNTER — Other Ambulatory Visit: Payer: Self-pay

## 2023-01-26 ENCOUNTER — Ambulatory Visit: Payer: Self-pay | Admitting: Family Medicine

## 2023-01-26 ENCOUNTER — Encounter (HOSPITAL_BASED_OUTPATIENT_CLINIC_OR_DEPARTMENT_OTHER): Payer: Self-pay

## 2023-01-26 ENCOUNTER — Other Ambulatory Visit: Payer: Self-pay | Admitting: Physician Assistant

## 2023-01-26 DIAGNOSIS — K5730 Diverticulosis of large intestine without perforation or abscess without bleeding: Secondary | ICD-10-CM | POA: Diagnosis not present

## 2023-01-26 DIAGNOSIS — Z8249 Family history of ischemic heart disease and other diseases of the circulatory system: Secondary | ICD-10-CM | POA: Diagnosis not present

## 2023-01-26 DIAGNOSIS — K828 Other specified diseases of gallbladder: Secondary | ICD-10-CM | POA: Diagnosis not present

## 2023-01-26 DIAGNOSIS — K653 Choleperitonitis: Secondary | ICD-10-CM | POA: Diagnosis not present

## 2023-01-26 DIAGNOSIS — R109 Unspecified abdominal pain: Secondary | ICD-10-CM | POA: Diagnosis not present

## 2023-01-26 DIAGNOSIS — D75839 Thrombocytosis, unspecified: Secondary | ICD-10-CM | POA: Diagnosis present

## 2023-01-26 DIAGNOSIS — R7303 Prediabetes: Secondary | ICD-10-CM | POA: Diagnosis present

## 2023-01-26 DIAGNOSIS — K651 Peritoneal abscess: Secondary | ICD-10-CM | POA: Diagnosis not present

## 2023-01-26 DIAGNOSIS — F331 Major depressive disorder, recurrent, moderate: Secondary | ICD-10-CM | POA: Diagnosis not present

## 2023-01-26 DIAGNOSIS — E785 Hyperlipidemia, unspecified: Secondary | ICD-10-CM | POA: Diagnosis present

## 2023-01-26 DIAGNOSIS — F32A Depression, unspecified: Secondary | ICD-10-CM | POA: Diagnosis present

## 2023-01-26 DIAGNOSIS — R1011 Right upper quadrant pain: Secondary | ICD-10-CM

## 2023-01-26 DIAGNOSIS — Z87891 Personal history of nicotine dependence: Secondary | ICD-10-CM

## 2023-01-26 DIAGNOSIS — I82611 Acute embolism and thrombosis of superficial veins of right upper extremity: Secondary | ICD-10-CM | POA: Diagnosis present

## 2023-01-26 DIAGNOSIS — E43 Unspecified severe protein-calorie malnutrition: Secondary | ICD-10-CM | POA: Diagnosis not present

## 2023-01-26 DIAGNOSIS — K5750 Diverticulosis of both small and large intestine without perforation or abscess without bleeding: Secondary | ICD-10-CM | POA: Diagnosis not present

## 2023-01-26 DIAGNOSIS — Z818 Family history of other mental and behavioral disorders: Secondary | ICD-10-CM

## 2023-01-26 DIAGNOSIS — K265 Chronic or unspecified duodenal ulcer with perforation: Secondary | ICD-10-CM | POA: Diagnosis not present

## 2023-01-26 DIAGNOSIS — K219 Gastro-esophageal reflux disease without esophagitis: Secondary | ICD-10-CM | POA: Diagnosis not present

## 2023-01-26 DIAGNOSIS — R1084 Generalized abdominal pain: Secondary | ICD-10-CM | POA: Diagnosis not present

## 2023-01-26 DIAGNOSIS — J90 Pleural effusion, not elsewhere classified: Secondary | ICD-10-CM | POA: Diagnosis not present

## 2023-01-26 DIAGNOSIS — K631 Perforation of intestine (nontraumatic): Secondary | ICD-10-CM | POA: Diagnosis present

## 2023-01-26 DIAGNOSIS — K822 Perforation of gallbladder: Secondary | ICD-10-CM | POA: Diagnosis present

## 2023-01-26 DIAGNOSIS — Z803 Family history of malignant neoplasm of breast: Secondary | ICD-10-CM

## 2023-01-26 DIAGNOSIS — K567 Ileus, unspecified: Secondary | ICD-10-CM | POA: Diagnosis not present

## 2023-01-26 DIAGNOSIS — K8012 Calculus of gallbladder with acute and chronic cholecystitis without obstruction: Secondary | ICD-10-CM | POA: Diagnosis not present

## 2023-01-26 DIAGNOSIS — K810 Acute cholecystitis: Secondary | ICD-10-CM | POA: Diagnosis present

## 2023-01-26 DIAGNOSIS — Z825 Family history of asthma and other chronic lower respiratory diseases: Secondary | ICD-10-CM

## 2023-01-26 DIAGNOSIS — Z8501 Personal history of malignant neoplasm of esophagus: Secondary | ICD-10-CM

## 2023-01-26 DIAGNOSIS — M7989 Other specified soft tissue disorders: Secondary | ICD-10-CM | POA: Diagnosis not present

## 2023-01-26 DIAGNOSIS — E871 Hypo-osmolality and hyponatremia: Secondary | ICD-10-CM | POA: Diagnosis not present

## 2023-01-26 DIAGNOSIS — Z90710 Acquired absence of both cervix and uterus: Secondary | ICD-10-CM

## 2023-01-26 DIAGNOSIS — K82A2 Perforation of gallbladder in cholecystitis: Secondary | ICD-10-CM | POA: Diagnosis not present

## 2023-01-26 DIAGNOSIS — T189XXA Foreign body of alimentary tract, part unspecified, initial encounter: Secondary | ICD-10-CM | POA: Diagnosis not present

## 2023-01-26 DIAGNOSIS — Z880 Allergy status to penicillin: Secondary | ICD-10-CM

## 2023-01-26 DIAGNOSIS — R188 Other ascites: Secondary | ICD-10-CM | POA: Diagnosis not present

## 2023-01-26 DIAGNOSIS — R590 Localized enlarged lymph nodes: Secondary | ICD-10-CM | POA: Diagnosis not present

## 2023-01-26 DIAGNOSIS — I10 Essential (primary) hypertension: Secondary | ICD-10-CM | POA: Diagnosis not present

## 2023-01-26 DIAGNOSIS — E876 Hypokalemia: Secondary | ICD-10-CM | POA: Diagnosis present

## 2023-01-26 DIAGNOSIS — K8010 Calculus of gallbladder with chronic cholecystitis without obstruction: Secondary | ICD-10-CM | POA: Diagnosis not present

## 2023-01-26 DIAGNOSIS — I714 Abdominal aortic aneurysm, without rupture, unspecified: Secondary | ICD-10-CM | POA: Diagnosis not present

## 2023-01-26 DIAGNOSIS — R1032 Left lower quadrant pain: Secondary | ICD-10-CM

## 2023-01-26 LAB — CBC WITH DIFFERENTIAL/PLATELET
Abs Immature Granulocytes: 0.03 10*3/uL (ref 0.00–0.07)
Basophils Absolute: 0 10*3/uL (ref 0.0–0.1)
Basophils Relative: 0 %
Eosinophils Absolute: 0 10*3/uL (ref 0.0–0.5)
Eosinophils Relative: 0 %
HCT: 43.4 % (ref 36.0–46.0)
Hemoglobin: 14.6 g/dL (ref 12.0–15.0)
Immature Granulocytes: 0 %
Lymphocytes Relative: 11 %
Lymphs Abs: 0.9 10*3/uL (ref 0.7–4.0)
MCH: 29.9 pg (ref 26.0–34.0)
MCHC: 33.6 g/dL (ref 30.0–36.0)
MCV: 88.9 fL (ref 80.0–100.0)
Monocytes Absolute: 0.2 10*3/uL (ref 0.1–1.0)
Monocytes Relative: 3 %
Neutro Abs: 6.6 10*3/uL (ref 1.7–7.7)
Neutrophils Relative %: 86 %
Platelets: 356 10*3/uL (ref 150–400)
RBC: 4.88 MIL/uL (ref 3.87–5.11)
RDW: 12.8 % (ref 11.5–15.5)
WBC: 7.7 10*3/uL (ref 4.0–10.5)
nRBC: 0 % (ref 0.0–0.2)

## 2023-01-26 LAB — COMPREHENSIVE METABOLIC PANEL
ALT: 101 U/L — ABNORMAL HIGH (ref 0–44)
AST: 64 U/L — ABNORMAL HIGH (ref 15–41)
Albumin: 3.5 g/dL (ref 3.5–5.0)
Alkaline Phosphatase: 132 U/L — ABNORMAL HIGH (ref 38–126)
Anion gap: 12 (ref 5–15)
BUN: 13 mg/dL (ref 8–23)
CO2: 26 mmol/L (ref 22–32)
Calcium: 9.2 mg/dL (ref 8.9–10.3)
Chloride: 95 mmol/L — ABNORMAL LOW (ref 98–111)
Creatinine, Ser: 0.8 mg/dL (ref 0.44–1.00)
GFR, Estimated: >60 mL/min (ref >60)
Glucose, Bld: 183 mg/dL — ABNORMAL HIGH (ref 70–99)
Potassium: 4.2 mmol/L (ref 3.5–5.1)
Sodium: 133 mmol/L — ABNORMAL LOW (ref 135–145)
Total Bilirubin: 0.9 mg/dL (ref 0.0–1.2)
Total Protein: 6.7 g/dL (ref 6.5–8.1)

## 2023-01-26 LAB — URINALYSIS, ROUTINE W REFLEX MICROSCOPIC
Bilirubin Urine: NEGATIVE
Glucose, UA: NEGATIVE mg/dL
Hgb urine dipstick: NEGATIVE
Ketones, ur: NEGATIVE mg/dL
Leukocytes,Ua: NEGATIVE
Nitrite: NEGATIVE
Protein, ur: NEGATIVE mg/dL
Specific Gravity, Urine: 1.044 — ABNORMAL HIGH (ref 1.005–1.030)
pH: 6 (ref 5.0–8.0)

## 2023-01-26 LAB — MAGNESIUM: Magnesium: 1.4 mg/dL — ABNORMAL LOW (ref 1.7–2.4)

## 2023-01-26 LAB — LIPASE, BLOOD: Lipase: 10 U/L — ABNORMAL LOW (ref 11–51)

## 2023-01-26 MED ORDER — MORPHINE SULFATE (PF) 4 MG/ML IV SOLN: 4.0000 mg | INTRAVENOUS | Status: DC | PRN

## 2023-01-26 MED ORDER — ONDANSETRON HCL 4 MG/2ML IJ SOLN: 4.0000 mg | Freq: Four times a day (QID) | INTRAMUSCULAR | Status: DC | PRN

## 2023-01-26 MED ORDER — METRONIDAZOLE 500 MG/100ML IV SOLN: 500.0000 mg | Freq: Two times a day (BID) | INTRAVENOUS | Status: DC

## 2023-01-26 MED ORDER — ONDANSETRON HCL 4 MG/2ML IJ SOLN
4.0000 mg | Freq: Once | INTRAMUSCULAR | Status: AC
  Administered 2023-01-26: 4 mg via INTRAVENOUS
  Filled 2023-01-26: qty 2

## 2023-01-26 MED ORDER — HYDROMORPHONE HCL 1 MG/ML IJ SOLN
0.5000 mg | INTRAMUSCULAR | Status: DC | PRN
  Administered 2023-01-26 – 2023-01-27 (×6): 0.5 mg via INTRAVENOUS
  Filled 2023-01-26 (×6): qty 1

## 2023-01-26 MED ORDER — MORPHINE SULFATE (PF) 4 MG/ML IV SOLN
4.0000 mg | Freq: Once | INTRAVENOUS | Status: AC
  Administered 2023-01-26: 4 mg via INTRAVENOUS
  Filled 2023-01-26: qty 1

## 2023-01-26 MED ORDER — LACTATED RINGERS IV SOLN: INTRAVENOUS | Status: DC

## 2023-01-26 MED ORDER — PIPERACILLIN-TAZOBACTAM 3.375 G IVPB
3.3750 g | Freq: Three times a day (TID) | INTRAVENOUS | Status: DC
  Administered 2023-01-26 – 2023-02-01 (×17): 3.375 g via INTRAVENOUS
  Filled 2023-01-26 (×17): qty 50

## 2023-01-26 MED ORDER — ONDANSETRON HCL 4 MG PO TABS: 4.0000 mg | ORAL_TABLET | Freq: Four times a day (QID) | ORAL | Status: DC | PRN

## 2023-01-26 MED ORDER — SODIUM CHLORIDE 0.9 % IV BOLUS
1000.0000 mL | Freq: Once | INTRAVENOUS | Status: AC
  Administered 2023-01-26: 1000 mL via INTRAVENOUS

## 2023-01-26 MED ORDER — MORPHINE SULFATE (PF) 4 MG/ML IV SOLN: 4.0000 mg | Freq: Once | INTRAVENOUS | Status: DC

## 2023-01-26 MED ORDER — PROCHLORPERAZINE EDISYLATE 10 MG/2ML IJ SOLN: 10.0000 mg | Freq: Four times a day (QID) | INTRAMUSCULAR | Status: DC | PRN

## 2023-01-26 MED ORDER — PIPERACILLIN-TAZOBACTAM 3.375 G IVPB 30 MIN
3.3750 g | Freq: Once | INTRAVENOUS | Status: AC
  Administered 2023-01-26: 3.375 g via INTRAVENOUS
  Filled 2023-01-26: qty 50

## 2023-01-26 MED ORDER — CARMEX CLASSIC LIP BALM EX OINT
1.0000 | TOPICAL_OINTMENT | CUTANEOUS | Status: DC | PRN
  Administered 2023-01-26: 1 via TOPICAL
  Filled 2023-01-26 (×2): qty 10

## 2023-01-26 MED ORDER — MAGNESIUM SULFATE 2 GM/50ML IV SOLN
2.0000 g | Freq: Once | INTRAVENOUS | Status: AC
  Administered 2023-01-26: 2 g via INTRAVENOUS
  Filled 2023-01-26: qty 50

## 2023-01-26 MED ORDER — IOHEXOL 300 MG/ML  SOLN
100.0000 mL | Freq: Once | INTRAMUSCULAR | Status: AC | PRN
Start: 2023-01-26 — End: 2023-01-26
  Administered 2023-01-26: 80 mL via INTRAVENOUS

## 2023-01-26 NOTE — ED Notes (Signed)
 Pt. Instructed on nothing by mouth.

## 2023-01-26 NOTE — ED Provider Notes (Signed)
 3:04 PM Assumed care of patient from off-going team. For more details, please see note from same day.  In brief, this is a 68 y.o. female who presents w/ R-sided abdominal pain. Labs show mild new transaminitis.  Plan/Dispo at time of sign-out & ED Course since sign-out: [ ]  send message to surgery when CT read comes back  BP 102/60   Pulse 94   Temp 97.7 F (36.5 C)   Resp 20   SpO2 96%    ED Course:   Clinical Course as of 01/26/23 1941  Mon Jan 26, 2023  1528 Multifocal fluid and gas collection of the upper abdomen surrounding the duodenum, potentially representing hollow viscus perforation and/or abscess. There is abnormal appearance of the wall of proximal duodenum on the CT, potentially site of ulceration/perforation.  Additionally there is acute cholecystitis with suspected gallbladder wall perforation, contributing 2 complex biloma adjacent to the fundus of the gallbladder, suspected bilious ascites, and CT evidence of bile peritonitis of the abdomen/pelvis.  Emergent referral for surgical evaluation is indicated given the above.  Although there are high density/enhancing foci in the gallbladder lumen, potentially polyps or gallstones, there is no high density foci within the common bile duct to account for both intrahepatic and extrahepatic biliary ductal dilatation. There is, however, tapering at the distal common bile duct and an ampullary stricture or tumor cannot be excluded. This site may be best evaluated with ERCP, less helpful would be MRCP at this time.  Borderline enlarged lymph nodes of the gastrohepatic ligament, subhepatic region, portacaval nodal stations, and retroperitoneum. While these may be reactive given the significant inflammation, a malignant process cannot be excluded.  Surgical changes of esophagectomy and pull-through.  These above results discussed by telephone at the time of interpretation on 01/26/2023 at 3:23 pm with APP provider, Darryle, caring  for the patient.  The right colon wall is edematous and inflamed, presumably reactive given the adjacent presumed biliary ascites. Fluid-filled colon may represent nonspecific enteritis/colitis.  Aortic atherosclerosis. 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 Ambulatory referral to vascular team to initiate surveillance would be recommended given the configuration. [HN]  1529 Messaged Burnard ohara PA with surgery. Added zosyn  IV. [HN]  1537 Dr. Teresa and Burnard requesting GI to get on board, ABX, NPO.  [HN]  1600 Discussed with Dr. Rosalie who states likely the patient needs perc drain and do intra-op cholangiogram, but if not then recommending MRCP.  [HN]    Clinical Course User Index [HN] Franklyn Sid SAILOR, MD    Dispo: Discussed extensively with patient and her husband.  Patient request to be admitted to [facility].  Patient is admitted to hospitalist at [facility] with surgery following. ------------------------------- Sid Franklyn, MD Emergency Medicine  This note was created using dictation software, which may contain spelling or grammatical errors.   Franklyn Sid SAILOR, MD 01/26/23 [phone]

## 2023-01-26 NOTE — ED Notes (Signed)
 Patient transported to CT

## 2023-01-26 NOTE — ED Notes (Signed)
 Thomas with cl called for transport

## 2023-01-26 NOTE — ED Provider Notes (Signed)
 [place] EMERGENCY DEPARTMENT AT [facility] Provider Note   CSN: [id number] Arrival date & time: 01/26/23  1141     History  No chief complaint on file.   Lauren [name] is a 68 y.o. female.  68 yo F with a chief complaint of right-sided abdominal pain.  Going on for a few days now.  Started more in the right upper quadrant and then she developed diffuse pain over the past few days.  She has been having some nausea vomiting and diarrhea.  She was seen in urgent care and she had a plain film of her abdomen and they told her that she tried to take something to clean herself out.  She called her family doctor today who encouraged her to come to the ED for evaluation.        Home Medications Prior to Admission medications   Medication Sig Start Date End Date Taking? Authorizing Provider  Ascorbic Acid  (VITAMIN C) 1000 MG tablet Take 1,000 mg by mouth daily.    [provider]  Cholecalciferol  (VITAMIN D ) 50 MCG (2000 UT) CAPS Take by mouth.    [provider]  gabapentin  (NEURONTIN ) 300 MG capsule Take by mouth. 06/23/22   [provider]  hyoscyamine  (LEVSIN  SL) 0.125 MG SL tablet Place 1 tablet (0.125 mg total) under the tongue every 4 (four) hours as needed. 11/17/22   Domenica Harlene LABOR, MD  methylPREDNISolone  (MEDROL ) 4 MG tablet 5 tabs po x 1 day then 4 tabs po x 1 day then 3 tabs po x 1 day then 2 tabs po x 1 day then 1 tab po x 1 day and stop 06/26/22   Domenica Harlene LABOR, MD  omeprazole  (PRILOSEC) 20 MG capsule Take 1 capsule (20 mg total) by mouth daily. 09/24/22   Domenica Harlene LABOR, MD  rosuvastatin  (CRESTOR ) 5 MG tablet Take 1 tablet (5 mg total) by mouth daily. 09/23/22   Domenica Harlene LABOR, MD  tiZANidine  (ZANAFLEX ) 2 MG tablet Take 0.5-2 tablets (1-4 mg total) by mouth every 8 (eight) hours as needed for muscle spasms. 06/26/22   Domenica Harlene LABOR, MD  venlafaxine  XR (EFFEXOR  XR) 75 MG 24 hr capsule Take 1 capsule (75 mg total) by mouth daily with  breakfast. Total daily dose of 225 08/18/22   Domenica Harlene LABOR, MD  venlafaxine  XR (EFFEXOR -XR) 150 MG 24 hr capsule TAKE 1 CAPSULE BY MOUTH EVERY DAY WITH BREAKFAST 06/10/22   Domenica Harlene LABOR, MD      Allergies    Penicillins    Review of Systems   Review of Systems  Physical Exam Updated Vital Signs BP 102/60   Pulse 94   Temp 97.7 F (36.5 C)   Resp 20   SpO2 96%  Physical Exam Vitals and nursing note reviewed.  Constitutional:      General: She is not in acute distress.    Appearance: She is well-developed. She is not diaphoretic.  HENT:     Head: Normocephalic and atraumatic.  Eyes:     Pupils: Pupils are equal, round, and reactive to light.  Cardiovascular:     Rate and Rhythm: Normal rate and regular rhythm.     Heart sounds: No murmur heard.    No friction rub. No gallop.  Pulmonary:     Effort: Pulmonary effort is normal.     Breath sounds: No wheezing or rales.  Abdominal:     General: There is no distension.     Palpations:  Abdomen is soft.     Tenderness: There is abdominal tenderness.     Comments: Diffusely tender abdomen.  No obvious area of focality.  Musculoskeletal:        General: No tenderness.     Cervical back: Normal range of motion and neck supple.  Skin:    General: Skin is warm and dry.  Neurological:     Mental Status: She is alert and oriented to person, place, and time.  Psychiatric:        Behavior: Behavior normal.     ED Results / Procedures / Treatments   Labs (all labs ordered are listed, but only abnormal results are displayed) Labs Reviewed  COMPREHENSIVE METABOLIC PANEL - Abnormal; Notable for the following components:      Result Value   Sodium 133 (*)    Chloride 95 (*)    Glucose, Bld 183 (*)    AST 64 (*)    ALT 101 (*)    Alkaline Phosphatase 132 (*)    All other components within normal limits  LIPASE, BLOOD - Abnormal; Notable for the following components:   Lipase 10 (*)    All other components within normal  limits  MAGNESIUM  - Abnormal; Notable for the following components:   Magnesium  1.4 (*)    All other components within normal limits  CBC WITH DIFFERENTIAL/PLATELET  URINALYSIS, ROUTINE W REFLEX MICROSCOPIC    EKG None  Radiology No results found.  Procedures Procedures    Medications Ordered in ED Medications  sodium chloride  0.9 % bolus 1,000 mL (0 mLs Intravenous Stopped 01/26/23 1332)  morphine  (PF) 4 MG/ML injection 4 mg (4 mg Intravenous Given 01/26/23 1231)  ondansetron  (ZOFRAN ) injection 4 mg (4 mg Intravenous Given 01/26/23 1231)  magnesium  sulfate IVPB 2 g 50 mL (0 g Intravenous Stopped 01/26/23 1452)  iohexol  (OMNIPAQUE ) 300 MG/ML solution 100 mL (80 mLs Intravenous Contrast Given 01/26/23 1329)  morphine  (PF) 4 MG/ML injection 4 mg (4 mg Intravenous Given 01/26/23 1442)    ED Course/ Medical Decision Making/ A&P                                 Medical Decision Making Amount and/or Complexity of Data Reviewed Labs: ordered. Radiology: ordered.  Risk Prescription drug management.   68 yo F with a chief complaints of abdominal pain nausea vomiting and diarrhea.  She said about a week ago she had developed the flu and had cough congestion fevers chills myalgias nausea vomiting and diarrhea and then had developed some right upper abdominal pain a few days ago that was worse with twisting turning and palpation and then developed diffuse abdominal discomfort.  She was seen in urgent care and started on some MiraLAX .  As that did not help her symptoms she called her PCP and encouraged her to come here for evaluation.   She has diffuse abdominal pain on my exam.  Will obtain CT imaging blood work treat pain and nausea reassess.  No leukocytosis.  Mild LFT elevation.  Mag level mildly low will replenish.    Awaiting CT imaging.  My view of CT concerning for gallbladder pathology.    Signed out to Dr. Franklyn, please see their note for further details of care in the  ED.  The patients results and plan were reviewed and discussed.   Any x-rays performed were independently reviewed by myself.   Differential diagnosis were considered with  the presenting HPI.  Medications  sodium chloride  0.9 % bolus 1,000 mL (0 mLs Intravenous Stopped 01/26/23 1332)  morphine  (PF) 4 MG/ML injection 4 mg (4 mg Intravenous Given 01/26/23 1231)  ondansetron  (ZOFRAN ) injection 4 mg (4 mg Intravenous Given 01/26/23 1231)  magnesium  sulfate IVPB 2 g 50 mL (0 g Intravenous Stopped 01/26/23 1452)  iohexol  (OMNIPAQUE ) 300 MG/ML solution 100 mL (80 mLs Intravenous Contrast Given 01/26/23 1329)  morphine  (PF) 4 MG/ML injection 4 mg (4 mg Intravenous Given 01/26/23 1442)    Vitals:   01/26/23 1215 01/26/23 1230 01/26/23 1300 01/26/23 1400  BP:  (!) 105/59 (!) 106/56 102/60  Pulse: 99 96 92 94  Resp: (!) 21 (!) 23 20 20   Temp:      SpO2: 96% 95% 96% 96%    Final diagnoses:  Generalized abdominal pain    Admission/ observation were discussed with the admitting physician, patient and/or family and they are comfortable with the plan.          Final Clinical Impression(s) / ED Diagnoses Final diagnoses:  Generalized abdominal pain    Rx / DC Orders ED Discharge Orders     None         Emil Share, DO 01/26/23 1509

## 2023-01-26 NOTE — ED Notes (Signed)
Report given to [facility] 

## 2023-01-26 NOTE — ED Triage Notes (Signed)
 Pt c/o "intense R sided (RU/RL) belly pain" onset approx 3 days ago. Seen at [facility] for same yesterday- bloodwork & xray taken, advised that if not better, go to ED for further eval. Hx gallstones

## 2023-01-26 NOTE — Telephone Encounter (Signed)
 Chief Complaint: worsening severe abdominal pain Symptoms: abdominal pain, nausea, vomiting, constipation, generalized weakness Frequency: x 2 days Pertinent Negatives: Patient denies back pain, fever, painful urination, dizziness/lightheaded Disposition: [x] ED /[] Urgent Care (no appt availability in office) / [] Appointment(In office/virtual)/ []  [facility]/ [] Home Care/ [] Refused Recommended Disposition /[] [facility]/ []  Follow-up with PCP Additional Notes: [facility] seen [facility] over the [facility], husband reports her liver enzymes were elevated and constipation was seen on XR; husband states they were told to follow up outpatient for additional imaging. Patient sick with flu like symptoms about a week ago, lasted 3 days and then began with abdominal symptoms. [facility] advised Miralax , patient was able to have small BM last night. Vomited additional dose of Miralax . Patient reports her symptoms are worsening. Recommeded hospital ED to patient and husband.  Copied from CRM [id number]. Topic: Clinical - Red Word Triage >> Jan 26, 2023  8:22 AM Isabell A wrote: [facility] that prompted transfer to Nurse Triage: Patient experiencing severe stomach pain and cramping, states she is nauseous as well, hiccups.Patient seen at [facility] care on [address] over the weekend, when the PA pressed on her stomach there was sever tenderness near her gallbladder & appendix. Reason for Disposition  [1] SEVERE pain (e.g., excruciating) AND [2] present > 1 hour  Answer Assessment - Initial Assessment Questions 1. LOCATION: Where does it hurt?      Started left upper abdominal pain and has now moved to generalized abdominal pain.  2. RADIATION: Does the pain shoot anywhere else? (e.g., chest, back)     Denies.  3. ONSET: When did the pain begin? (e.g., minutes, hours or days ago)      X 2 days.  4. SUDDEN: Gradual or sudden onset?     Sudden.  5. PATTERN Does  the pain come and go, or is it constant?    - If it comes and goes: How long does it last? Do you have pain now?     (Note: Comes and goes means the pain is intermittent. It goes away completely between bouts.)    - If constant: Is it getting better, staying the same, or getting worse?      (Note: Constant means the pain never goes away completely; most serious pain is constant and gets worse.)      Constant; getting worse.  6. SEVERITY: How bad is the pain?  (e.g., Scale 1-10; mild, moderate, or severe)    - MILD (1-3): Doesn't interfere with normal activities, abdomen soft and not tender to touch.     - MODERATE (4-7): Interferes with normal activities or awakens from sleep, abdomen tender to touch.     - SEVERE (8-10): Excruciating pain, doubled over, unable to do any normal activities.       9/10. 7. RECURRENT SYMPTOM: Have you ever had this type of stomach pain before? If Yes, ask: When was the last time? and What happened that time?      Denies.  8. CAUSE: What do you think is causing the stomach pain?     Husband states originally they thought it was due to constipation  9. RELIEVING/AGGRAVATING FACTORS: What makes it better or worse? (e.g., antacids, bending or twisting motion, bowel movement)     Extra strength tylenol  helped with pain yesterday, so far it has only gotten worse.  10. OTHER SYMPTOMS: Do you have any other symptoms? (e.g., back pain, diarrhea, fever, urination pain, vomiting)  Nausea, vomiting, constipation (last bowel movement a little last night and diarrhea 4 days ago), generalized weakness.  Protocols used: Abdominal Pain - Female-A-AH

## 2023-01-26 NOTE — ED Provider Notes (Signed)
 Radiology called about critical CT reading for patient. He said that there is a ruptured duodenum, most likely second to bowel abscess, as well as acute cholecystitis with ruptured gallbladder. Free air noted in abdomen. Emergent surgery consult recommended.  Informed my attending, Dr. Franklyn.   Waddell Sluder, PA-C 01/26/23 1529    Franklyn Sid SAILOR, MD 01/26/23 [phone]

## 2023-01-26 NOTE — Hospital Course (Addendum)
 Brief Narrative:  68 yo female with past medical history of prediabetes,hyperlipidemia presented to hospital with abdominal pain nausea vomiting diarrhea, for 1 week initially started with nausea and vomiting. In the ED, vitals stable except for tachycardia. Labs with mild hyponatremia hypomagnesemia with magnesium  level of 1.4. Elevated LFTs. HIV was nonreactive. Urinalysis was negative. CT abdomen done in the ED showed bowel perforation and walled off abscess as well as cholecystitis and gallbladder perforation. Lipase was 10. Narrowing of CBD-?malignancy unclear. General surgery was consulted and patient was admitted hospital for further evaluation and treatment  Underwent diagnostic laparoscopy with open cholecystectomy with drainage of intra-abdominal abscess by general surgery on 1/14. Postoperatively patient has been very slow to improve. Medicine following for basic electrolyte management or if anything else arises.   Assessment & Plan:  Principal Problem:   Perforated bowel (HCC) Active Problems:   Perforated gallbladder     Acute cholecystitis with gallbladder perforation with possible walled off abscess Status post diagnostic laparoscopy with open cholecystectomy with drainage of abscess 1/14 Off Antibiotics.  Postop management by general surgery General Surgery repeating CT scan today  Atelectasis with pleural effusion Aggressive pulmonary toileting, as needed bronchodilators, I-S and flutter valve.  Out of bed to chair, mobilize as much as possible.   Hyponatremia/Hypokalemia/hypophosphatemia/hypocalcemia Aggressive electrolyte repletion as needed.  Thrombocytosis - Closely monitor   Elevated LFTs. Stable  Hyperlipidemia/prediabetes - Diet controlled  Depression - Venlafaxine    DVT prophylaxis: Per primary team, general surgery   Code Status: Full code  Family Communication: None at bedside Disposition-postop management by general  surgery  Subjective:   Examination:  General exam: Appears calm and comfortable  Respiratory system: Clear to auscultation. Respiratory effort normal. Cardiovascular system: S1 & S2 heard, RRR. No JVD, murmurs, rubs, gallops or clicks. trace pedal edema. Gastrointestinal system: Abdomen is nondistended, soft and nontender. No organomegaly or masses felt.  No bowel sounds heard yet.  Central nervous system: Alert and oriented. No focal neurological deficits. Extremities: Symmetric 5 x 5 power. Skin: No rashes, lesions or ulcers Psychiatry: Judgement and insight appear normal. Mood & affect appropriate. RLE drain in place.  RUE PICC in place.

## 2023-01-26 NOTE — Progress Notes (Signed)
 Pharmacy Antibiotic Note  Lauren [name] is a 68 y.o. female admitted on 01/26/2023 presenting with abdominal pain, concern for bowel abscess complications.  Pharmacy has been consulted for zosyn  dosing.  Plan: Zosyn  3.375g IV q 8h (extended 4h infusion) Monitor renal function, clinical progression, surgery plans and LOT     Temp (24hrs), Avg:97.7 F (36.5 C), Min:97.7 F (36.5 C), Max:97.7 F (36.5 C)  Recent Labs  Lab 01/26/23 1214  WBC 7.7  CREATININE 0.80    CrCl cannot be calculated (Unknown ideal weight.).    Allergies  Allergen Reactions   Penicillins Hives and Nausea And Vomiting    PCN - Hives when she was 14 Amoxicillin  - vomiting and chills (11/22/19)      Dorn Poot, PharmD, [facility] Clinical Pharmacist ED Pharmacist Phone # [phone] 01/26/2023 3:33 PM

## 2023-01-26 NOTE — Progress Notes (Deleted)
 Pharmacy Antibiotic Note  Lauren [name] is a 68 y.o. female admitted on 01/26/2023 with ruptured duodenum d/t bowel abscess and acute cholecystitis with ruptured gallbladder..  Pharmacy has been consulted for piperacillin /tazobactam  dosing.  Plan: piperacillin /tazobactam 3.375g IV x1 over 30 min then 3.375g q8 hr EI   Monitor cultures, clinical status, renal function Narrow abx as able and f/u duration     Temp (24hrs), Avg:97.7 F (36.5 C), Min:97.7 F (36.5 C), Max:97.7 F (36.5 C)  Recent Labs  Lab 01/26/23 1214  WBC 7.7  CREATININE 0.80    CrCl cannot be calculated (Unknown ideal weight.).    Allergies  Allergen Reactions   Penicillins Hives and Nausea And Vomiting    PCN - Hives when she was 14 Amoxicillin  - vomiting and chills (11/22/19)      Antimicrobials this admission: piptazo 1/13 >>    Microbiology results: 1/13 BCx: pend  Thank you for allowing pharmacy to be a part of this patient's care.  Jinnie Door, PharmD, BCPS, BCCP Clinical Pharmacist  Please check AMION for all [facility] Pharmacy phone numbers After 10:00 PM, call Main Pharmacy [phone]

## 2023-01-26 NOTE — H&P (Addendum)
 History and Physical    Patient: Lauren [name] FMW:[id number] DOB: [birth year] DOA: 01/26/2023 DOS: the patient was seen and examined on 01/26/2023 PCP: Domenica Harlene LABOR, MD  Patient coming from: Home  Chief Complaint:  Chief Complaint  Patient presents with   Abdominal Pain   HPI: Lauren [name] is a 68 y.o. 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  The patient has had trouble with nausea, vomiting, diarrhea, chills, and hiccups for the last week.  About 4 days ago she started to have pain also.  Yesterday she presented to urgent care because the pain was not going away.  She was given MiraLAX .  She did have a small bowel movement but her pain continued to worsen.  Today she went to the emergency department because the pain had become severe.  Her workup in the emergency department included a CT of the abdomen and pelvis which revealed a duodenal ulcer with perforation and walled off abscess as well as cholecystitis and gallbladder perforation. General surgery and GI were called from the emergency department.   It was not felt that emergent surgery was necessary so the patient will be admitted by the hospitalist service.   Review of Systems: As mentioned in the history of present illness. All other systems reviewed and are negative. 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   Chicken pox as a child   Depression    been on antidepressants on and off for 20 yr   GERD (gastroesophageal reflux disease)    silent with hiatal hernia   Hiatal hernia 05/22/2013   Hiatal hernia with gastroesophageal reflux 05/22/2013   Hot tub folliculitis 12/18/2013   Hyperglycemia 05/19/2016   Hyperlipidemia 05/19/2016   Measles as a child   Postmenopausal atrophic vaginitis  05/22/2013   Preventative health care 01/19/2015   Shingles 68 yrs old   Unspecified vitamin D  deficiency 05/22/2013   Past Surgical History:  Procedure Laterality Date   ABDOMINAL HYSTERECTOMY  1998   partial-still has ovaries   BILIARY STENT PLACEMENT N/A 11/23/2019   Procedure: BILIARY STENT PLACEMENT;  Surgeon: Rollin Dover, MD;  Location: [facility];  Service: Endoscopy;  Laterality: N/A;   ENDOSCOPIC RETROGRADE CHOLANGIOPANCREATOGRAPHY (ERCP) WITH PROPOFOL  N/A 06/01/2020   Procedure: ENDOSCOPIC RETROGRADE CHOLANGIOPANCREATOGRAPHY (ERCP) WITH PROPOFOL ;  Surgeon: Rollin Dover, MD;  Location: [facility];  Service: Endoscopy;  Laterality: N/A;   ERCP N/A 11/23/2019   Procedure: ENDOSCOPIC RETROGRADE CHOLANGIOPANCREATOGRAPHY (ERCP);  Surgeon: Rollin Dover, MD;  Location: [facility];  Service: Endoscopy;  Laterality: N/A;   ESOPHAGECTOMY  05-2008   INDUCED ABORTION  1979   REMOVAL OF STONES  11/23/2019   Procedure: REMOVAL OF STONES;  Surgeon: Rollin Dover, MD;  Location: [facility];  Service: Endoscopy;;   SPHINCTEROTOMY  11/23/2019   Procedure: ANNETT;  Surgeon: Rollin Dover, MD;  Location: [facility];  Service: Endoscopy;;   STENT REMOVAL  06/01/2020   Procedure: STENT REMOVAL;  Surgeon: Rollin Dover, MD;  Location: [facility];  Service: Endoscopy;;   Social History:  reports that she has quit smoking. Her smoking use included cigarettes. She started smoking about 25 years ago. She has a 2.5 pack-year smoking history. She has never used smokeless tobacco. She reports current alcohol use. She reports that she does not use drugs.  Allergies  Allergen Reactions   Penicillins Hives and Nausea And Vomiting    PCN - Hives when she was 14 Amoxicillin  - vomiting and chills (11/22/19)      Family History  Problem Relation Age of Onset   Cancer Mother 22       breast cancer   Emphysema Mother        smoker   Heart attack Father        X several   Mental illness  Brother        bipolar, xanax and thc abuse   Obesity Brother    Depression Daughter    Anxiety disorder Daughter    Other Maternal Grandmother        blood clot, dvt, lung   Cancer Paternal Grandmother        stomach   Heart disease Paternal Grandfather     Prior to Admission medications   Medication Sig Start Date End Date Taking? Authorizing Provider  Ascorbic Acid  (VITAMIN C) 1000 MG tablet Take 1,000 mg by mouth daily.    [provider]  Cholecalciferol  (VITAMIN D ) 50 MCG (2000 UT) CAPS Take by mouth.    [provider]  gabapentin  (NEURONTIN ) 300 MG capsule Take by mouth. 06/23/22   [provider]  hyoscyamine  (LEVSIN  SL) 0.125 MG SL tablet Place 1 tablet (0.125 mg total) under the tongue every 4 (four) hours as needed. 11/17/22   Domenica Harlene LABOR, MD  methylPREDNISolone  (MEDROL ) 4 MG tablet 5 tabs po x 1 day then 4 tabs po x 1 day then 3 tabs po x 1 day then 2 tabs po x 1 day then 1 tab po x 1 day and stop 06/26/22   Domenica Harlene LABOR, MD  omeprazole  (PRILOSEC) 20 MG capsule Take 1 capsule (20 mg total) by mouth daily. 09/24/22   Domenica Harlene LABOR, MD  rosuvastatin  (CRESTOR ) 5 MG tablet Take 1 tablet (5 mg total) by mouth daily. 09/23/22   Domenica Harlene LABOR, MD  tiZANidine  (ZANAFLEX ) 2 MG tablet Take 0.5-2 tablets (1-4 mg total) by mouth every 8 (eight) hours as needed for muscle spasms. 06/26/22   Domenica Harlene LABOR, MD  venlafaxine  XR (EFFEXOR  XR) 75 MG 24 hr capsule Take 1 capsule (75 mg total) by mouth daily with breakfast. Total daily dose of 225 08/18/22   Domenica Harlene LABOR, MD  venlafaxine  XR (EFFEXOR -XR) 150 MG 24 hr capsule TAKE 1 CAPSULE BY MOUTH EVERY DAY WITH BREAKFAST 06/10/22   Domenica Harlene LABOR, MD    Physical Exam: Vitals:   01/26/23 1600 01/26/23 1630 01/26/23 1636 01/26/23 1700  BP: (!) 113/57 114/66  (!) 111/59  Pulse: 95 (!) 101  92  Resp: 14 14    Temp:   98.3 F (36.8 C)   TempSrc:   Oral   SpO2: 94% 100%  96%   Physical Exam:  General: No  acute distress, well developed, well nourished HEENT: Normocephalic, atraumatic, PERRL Cardiovascular: Normal rate and rhythm. Distal pulses intact. Pulmonary: Normal pulmonary effort, normal breath sounds Gastrointestinal: Distended abdomen, tender diffusely, no bowel sounds Musculoskeletal:Normal ROM, no lower ext edema Skin: Skin is warm and dry. Neuro: No focal deficits noted, AAOx3. PSYCH: Attentive and cooperative  Data Reviewed:  Results for orders placed or performed during the hospital encounter of 01/26/23 (from the past 24 hours)  CBC with Differential     Status: None   Collection Time: 01/26/23 12:14 PM  Result Value Ref Range   WBC 7.7  4.0 - 10.5 K/uL   RBC 4.88 3.87 - 5.11 MIL/uL   Hemoglobin 14.6 12.0 - 15.0 g/dL   HCT 56.5 63.9 - 53.9 %   MCV 88.9 80.0 - 100.0 fL   MCH 29.9 26.0 - 34.0 pg   MCHC 33.6 30.0 - 36.0 g/dL   RDW 87.1 88.4 - 84.4 %   Platelets 356 150 - 400 K/uL   nRBC 0.0 0.0 - 0.2 %   Neutrophils Relative % 86 %   Neutro Abs 6.6 1.7 - 7.7 K/uL   Band Neutrophils HIDE %   Lymphocytes Relative 11 %   Lymphs Abs 0.9 0.7 - 4.0 K/uL   Monocytes Relative 3 %   Monocytes Absolute 0.2 0.1 - 1.0 K/uL   Eosinophils Relative 0 %   Eosinophils Absolute 0.0 0.0 - 0.5 K/uL   Basophils Relative 0 %   Basophils Absolute 0.0 0.0 - 0.1 K/uL   Immature Granulocytes 0 %   Abs Immature Granulocytes 0.03 0.00 - 0.07 K/uL  Comprehensive metabolic panel     Status: Abnormal   Collection Time: 01/26/23 12:14 PM  Result Value Ref Range   Sodium 133 (L) 135 - 145 mmol/L   Potassium 4.2 3.5 - 5.1 mmol/L   Chloride 95 (L) 98 - 111 mmol/L   CO2 26 22 - 32 mmol/L   Glucose, Bld 183 (H) 70 - 99 mg/dL   BUN 13 8 - 23 mg/dL   Creatinine, Ser 9.19 0.44 - 1.00 mg/dL   Calcium  9.2 8.9 - 10.3 mg/dL   Total Protein 6.7 6.5 - 8.1 g/dL   Albumin 3.5 3.5 - 5.0 g/dL   AST 64 (H) 15 - 41 U/L   ALT 101 (H) 0 - 44 U/L   Alkaline Phosphatase 132 (H) 38 - 126 U/L   Total Bilirubin  0.9 0.0 - 1.2 mg/dL   GFR, Estimated >39 >39 mL/min   Anion gap 12 5 - 15  Lipase, blood     Status: Abnormal   Collection Time: 01/26/23 12:14 PM  Result Value Ref Range   Lipase 10 (L) 11 - 51 U/L  Magnesium      Status: Abnormal   Collection Time: 01/26/23 12:14 PM  Result Value Ref Range   Magnesium  1.4 (L) 1.7 - 2.4 mg/dL  Urinalysis, Routine w reflex microscopic -Urine, Clean Catch     Status: Abnormal   Collection Time: 01/26/23 12:15 PM  Result Value Ref Range   Color, Urine YELLOW YELLOW   APPearance CLEAR CLEAR   Specific Gravity, Urine 1.044 (H) 1.005 - 1.030   pH 6.0 5.0 - 8.0   Glucose, UA NEGATIVE NEGATIVE mg/dL   Hgb urine dipstick NEGATIVE NEGATIVE   Bilirubin Urine NEGATIVE NEGATIVE   Ketones, ur NEGATIVE NEGATIVE mg/dL   Protein, ur NEGATIVE NEGATIVE mg/dL   Nitrite NEGATIVE NEGATIVE   Leukocytes,Ua NEGATIVE NEGATIVE     Assessment and Plan: Acute cholecystitis with gallbladder perforation and bile peritonitis Duodenal perforation with possible walled off abscess Hypomagnesemia  Transaminitis  - General surgery is seeing the patient at this time - GI consult - Pain control, NPO, IV fluids, and supportive care - Replace electrolytes - Hold all routine oral medications   Advance Care Planning:   Code Status: Prior the patient will be full code by default.  Code status was not discussed.  Consults: GI and general surgery  Family Communication: none  Severity of Illness: The appropriate patient status for this patient is INPATIENT. Inpatient status is  judged to be reasonable and necessary in order to provide the required intensity of service to ensure the patient's safety. The patient's presenting symptoms, physical exam findings, and initial radiographic and laboratory data in the context of their chronic comorbidities is felt to place them at high risk for further clinical deterioration. Furthermore, it is not anticipated that the patient will be  medically stable for discharge from the hospital within 2 midnights of admission.   * I certify that at the point of admission it is my clinical judgment that the patient will require inpatient hospital care spanning beyond 2 midnights from the point of admission due to high intensity of service, high risk for further deterioration and high frequency of surveillance required.*  Author: ARTHEA CHILD, MD 01/26/2023 6:46 PM  For on call review [email or web address].

## 2023-01-26 NOTE — ED Notes (Signed)
 Ambulatory to restroom

## 2023-01-26 NOTE — Consult Note (Signed)
 Reason for Consult: Abdominal pain Referring Physician: Dr. Emil Devere Lauren [name] is an 68 y.o. female.  HPI: Patient is a 68 year old female, with history of esophageal cancer status post esophagectomy with gastric pull-up and J -tube approximately 15 years ago at [facility].  Patient comes in today secondary to an 8-day history of the flu, nausea vomiting, diarrhea.  She states that she had some epigastric/right upper quadrant pain at that time.  She states that over the last 3 to 4 days she has noticed that this migrated out of the right lower quadrant area.  She states that she has had minimal p.o. intake during this time.  She states that she was seen at an urgent care yesterday and had a KUB.  She was told to take MiraLAX .  She states that she did take this however had minimal production.  Patient was seen in the outside ER secondary to continued pain.  She underwent CT scan.  CT scan results below.  Patient with normal white count on today's labs.  Patient denies any fever or chills.  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  I did review the patient's CT scan laboratory studies personally.    Past Medical History:  Diagnosis Date   Cancer [facility]) 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   Chicken pox as a child   Depression    been on antidepressants on and off for 20 yr   GERD (gastroesophageal reflux disease)    silent with hiatal hernia   Hiatal hernia 05/22/2013   Hiatal hernia with gastroesophageal reflux 05/22/2013   Hot tub folliculitis 12/18/2013   Hyperglycemia 05/19/2016   Hyperlipidemia 05/19/2016   Measles as a child   Postmenopausal atrophic vaginitis 05/22/2013   Preventative health care 01/19/2015   Shingles 68 yrs old   Unspecified vitamin D  deficiency 05/22/2013    Past Surgical History:  Procedure Laterality Date   ABDOMINAL HYSTERECTOMY  1998   partial-still has ovaries   BILIARY STENT PLACEMENT N/A  11/23/2019   Procedure: BILIARY STENT PLACEMENT;  Surgeon: Rollin Dover, MD;  Location: [facility];  Service: Endoscopy;  Laterality: N/A;   ENDOSCOPIC RETROGRADE CHOLANGIOPANCREATOGRAPHY (ERCP) WITH PROPOFOL  N/A 06/01/2020   Procedure: ENDOSCOPIC RETROGRADE CHOLANGIOPANCREATOGRAPHY (ERCP) WITH PROPOFOL ;  Surgeon: Rollin Dover, MD;  Location: [facility];  Service: Endoscopy;  Laterality: N/A;   ERCP N/A 11/23/2019   Procedure: ENDOSCOPIC RETROGRADE CHOLANGIOPANCREATOGRAPHY (ERCP);  Surgeon: Rollin Dover, MD;  Location: [facility];  Service: Endoscopy;  Laterality: N/A;   ESOPHAGECTOMY  05-2008   INDUCED ABORTION  1979   REMOVAL OF STONES  11/23/2019   Procedure: REMOVAL OF STONES;  Surgeon: Rollin Dover, MD;  Location: [facility];  Service: Endoscopy;;   SPHINCTEROTOMY  11/23/2019   Procedure: ANNETT;  Surgeon: Rollin Dover, MD;  Location: [facility];  Service: Endoscopy;;   STENT REMOVAL  06/01/2020   Procedure: STENT REMOVAL;  Surgeon: Rollin Dover, MD;  Location: [facility];  Service: Endoscopy;;    Family History  Problem Relation Age of Onset   Cancer Mother 7       breast cancer   Emphysema Mother        smoker   Heart attack Father        X several   Mental illness Brother        bipolar, xanax and thc abuse   Obesity Brother    Depression Daughter    Anxiety disorder  Daughter    Other Maternal Grandmother        blood clot, dvt, lung   Cancer Paternal Grandmother        stomach   Heart disease Paternal Grandfather     Social History:  reports that she has quit smoking. Her smoking use included cigarettes. She started smoking about 25 years ago. She has a 2.5 pack-year smoking history. She has never used smokeless tobacco. She reports current alcohol use. She reports that she does not use drugs.  Allergies:  Allergies  Allergen Reactions   Penicillins Hives and Nausea And Vomiting    PCN - Hives when she was 14 Amoxicillin  - vomiting and  chills (11/22/19)      Medications: I have reviewed the patient's current medications.  Results for orders placed or performed during the hospital encounter of 01/26/23 (from the past 48 hours)  CBC with Differential     Status: None   Collection Time: 01/26/23 12:14 PM  Result Value Ref Range   WBC 7.7 4.0 - 10.5 K/uL   RBC 4.88 3.87 - 5.11 MIL/uL   Hemoglobin 14.6 12.0 - 15.0 g/dL   HCT 56.5 63.9 - 53.9 %   MCV 88.9 80.0 - 100.0 fL   MCH 29.9 26.0 - 34.0 pg   MCHC 33.6 30.0 - 36.0 g/dL   RDW 87.1 88.4 - 84.4 %   Platelets 356 150 - 400 K/uL   nRBC 0.0 0.0 - 0.2 %   Neutrophils Relative % 86 %   Neutro Abs 6.6 1.7 - 7.7 K/uL   Band Neutrophils HIDE %   Lymphocytes Relative 11 %   Lymphs Abs 0.9 0.7 - 4.0 K/uL   Monocytes Relative 3 %   Monocytes Absolute 0.2 0.1 - 1.0 K/uL   Eosinophils Relative 0 %   Eosinophils Absolute 0.0 0.0 - 0.5 K/uL   Basophils Relative 0 %   Basophils Absolute 0.0 0.0 - 0.1 K/uL   Immature Granulocytes 0 %   Abs Immature Granulocytes 0.03 0.00 - 0.07 K/uL    Comment: Performed at [facility], [address], [place], [place] [address]  Comprehensive metabolic panel     Status: Abnormal   Collection Time: 01/26/23 12:14 PM  Result Value Ref Range   Sodium 133 (L) 135 - 145 mmol/L   Potassium 4.2 3.5 - 5.1 mmol/L   Chloride 95 (L) 98 - 111 mmol/L   CO2 26 22 - 32 mmol/L   Glucose, Bld 183 (H) 70 - 99 mg/dL    Comment: Glucose reference range applies only to samples taken after fasting for at least 8 hours.   BUN 13 8 - 23 mg/dL   Creatinine, Ser 9.19 0.44 - 1.00 mg/dL   Calcium  9.2 8.9 - 10.3 mg/dL   Total Protein 6.7 6.5 - 8.1 g/dL   Albumin 3.5 3.5 - 5.0 g/dL   AST 64 (H) 15 - 41 U/L   ALT 101 (H) 0 - 44 U/L   Alkaline Phosphatase 132 (H) 38 - 126 U/L   Total Bilirubin 0.9 0.0 - 1.2 mg/dL   GFR, Estimated >39 >39 mL/min    Comment: (NOTE) Calculated using the CKD-EPI Creatinine Equation (2021)    Anion gap 12 5 -  15    Comment: Performed at [facility], [address] [place], [place] [address]  Lipase, blood     Status: Abnormal   Collection Time: 01/26/23 12:14 PM  Result Value Ref Range   Lipase  10 (L) 11 - 51 U/L    Comment: Performed at [facility], [address] [place], [place] [address]  Magnesium      Status: Abnormal   Collection Time: 01/26/23 12:14 PM  Result Value Ref Range   Magnesium  1.4 (L) 1.7 - 2.4 mg/dL    Comment: Performed at [facility], [address] [place], [place], [place] [address]  Urinalysis, Routine w reflex microscopic -Urine, Clean Catch     Status: Abnormal   Collection Time: 01/26/23 12:15 PM  Result Value Ref Range   Color, Urine YELLOW YELLOW   APPearance CLEAR CLEAR   Specific Gravity, Urine 1.044 (H) 1.005 - 1.030   pH 6.0 5.0 - 8.0   Glucose, UA NEGATIVE NEGATIVE mg/dL   Hgb urine dipstick NEGATIVE NEGATIVE   Bilirubin Urine NEGATIVE NEGATIVE   Ketones, ur NEGATIVE NEGATIVE mg/dL   Protein, ur NEGATIVE NEGATIVE mg/dL   Nitrite NEGATIVE NEGATIVE   Leukocytes,Ua NEGATIVE NEGATIVE    Comment: Performed at [facility], [address] [place], [place] [address]    CT ABDOMEN PELVIS W CONTRAST Result Date: 01/26/2023 CLINICAL DATA:  68 year old female with abdominal pain EXAM: CT ABDOMEN AND PELVIS WITH CONTRAST TECHNIQUE: Multidetector CT imaging of the abdomen and pelvis was performed using the standard protocol following bolus administration of intravenous contrast. RADIATION DOSE REDUCTION: This exam was performed according to the departmental dose-optimization program which includes automated exposure control, adjustment of the mA and/or kV according to patient size and/or use of iterative reconstruction technique. CONTRAST:  80mL OMNIPAQUE  IOHEXOL  300 MG/ML  SOLN COMPARISON:  CT 01/27/2008 FINDINGS: Lower chest: Scarring at the left medial lung base Hepatobiliary: Hepatic  parenchyma relatively unremarkable. There is intrahepatic ductal dilatation of both left and right bile ducts. The common hepatic duct and common bile duct are dilated. No radiopaque stones. There is abrupt transition at the ampulla. Additionally the pancreatic duct is dilated. Extensive in circumferential gallbladder wall thickening and enhancement. The gallbladder is relatively elongated and distended with some high density/enhancing stones versus polyps. There is evidence of gallbladder wall rupture towards the right side with adjacent low-density fluid and peritoneal enhancement. This fluid collection to the right of the gallbladder is estimated on coronal images 7.1 cm by 3.0 cm. A second fluid and gas collection interposed between the gallbladder and the duodenum is estimated 4.6 cm x 2.3 cm. Pancreas: Pancreatic tissue relatively uniform, though not well evaluated at the head of the pancreas secondary to extensive adjacent inflammatory changes and multi spatial abscess. Pancreatic duct is enlarged. Spleen: Unremarkable Adrenals/Urinary Tract: - Right adrenal gland:  Unremarkable - Left adrenal gland: Unremarkable. - Right kidney: No hydronephrosis, nephrolithiasis, inflammation, or ureteral dilation. No focal lesion. - Left Kidney: No hydronephrosis, nephrolithiasis, inflammation, or ureteral dilation. 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 - Urinary Bladder: Unremarkable. Stomach/Bowel: - Stomach: Surgical changes of esophagectomy and pull-through - Small bowel: Duodenum is adjacent to multifocal abscess. There is questionable defect in the left wall of the duodenum (image 36 of series 2, axial images). This is continuous with the fluid and gas collection adjacent to small bowel. - Appendix: Normal. - Colon: Secondary inflammatory changes in the wall of the right colon. The right colon, transverse colon, left colon are fluid-filled. Left-sided diverticular disease,  particularly in the sigmoid colon. No evidence of obstruction. Vascular/Lymphatic: Significant irregular soft plaque of the distal thoracic and upper abdominal aorta. No dissection. Ectasias of the infrarenal abdominal aorta, with transverse diameter  measuring 22 mm at the site of saccular outpouching. Mixed calcified and soft plaque throughout the abdominal aorta, with no pedunculated plaque. Bilateral iliac arteries and proximal femoral arteries patent. Mesenteric arteries are patent.  Bilateral renal arteries patent. Unremarkable venous system. Multiple lymph nodes in the upper abdomen and the retroperitoneal region. Index node in the gastrohepatic ligament measures 7 mm. Subhepatic lymph node on image 31 measures 7 mm. Multiple borderline enlarged lymph nodes in the preaortic/periaortic nodal stations, as well as in the portal caval nodal stations. Reproductive: Hysterectomy Other: Low-density interloop fluid of the small bowel loops as well as ascitic fluid in the pelvis. Associated peritoneal enhancement in the pelvis. Musculoskeletal: No acute displaced fracture. Degenerative changes of the visualized thoracolumbar spine. Degenerative disc disease at L2-L3 and L5-S1 with no bony canal narrowing. IMPRESSION: Multifocal fluid and gas collection of the upper abdomen surrounding the duodenum, potentially representing hollow viscus perforation and/or abscess. There is abnormal appearance of the wall of proximal duodenum on the CT, potentially site of ulceration/perforation. Additionally there is acute cholecystitis with suspected gallbladder wall perforation, contributing 2 complex biloma adjacent to the fundus of the gallbladder, suspected bilious ascites, and CT evidence of bile peritonitis of the abdomen/pelvis. Emergent referral for surgical evaluation is indicated given the above. Although there are high density/enhancing foci in the gallbladder lumen, potentially polyps or gallstones, there is no high  density foci within the common bile duct to account for both intrahepatic and extrahepatic biliary ductal dilatation. There is, however, tapering at the distal common bile duct and an ampullary stricture or tumor cannot be excluded. This site may be best evaluated with ERCP, less helpful would be MRCP at this time. Borderline enlarged lymph nodes of the gastrohepatic ligament, subhepatic region, portacaval nodal stations, and retroperitoneum. While these may be reactive given the significant inflammation, a malignant process cannot be excluded. Surgical changes of esophagectomy and pull-through. These above results discussed by telephone at the time of interpretation on 01/26/2023 at 3:23 pm with APP provider, Darryle, caring for the patient. The right colon wall is edematous and inflamed, presumably reactive given the adjacent presumed biliary ascites. Fluid-filled colon may represent nonspecific enteritis/colitis. Aortic atherosclerosis. 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 Ambulatory referral to vascular team to initiate surveillance would be recommended given the configuration. Additional ancillary findings as above. Signed, Ami RAMAN. Alona ROSALEA GRAVER, RPVI [facility] Electronically Signed   By: Ami Alona D.O.   On: 01/26/2023 15:24    Review of Systems  Constitutional:  Negative for chills and fever.  HENT:  Negative for ear discharge, hearing loss and sore throat.   Eyes:  Negative for discharge.  Respiratory:  Negative for cough and shortness of breath.   Cardiovascular:  Negative for chest pain and leg swelling.  Gastrointestinal:  Positive for abdominal distention, abdominal pain, diarrhea, nausea and vomiting. Negative for constipation.  Musculoskeletal:  Negative for myalgias and neck pain.  Skin:  Negative for rash.  Allergic/Immunologic: Negative for  environmental allergies.  Neurological:  Negative for dizziness and seizures.  Hematological:  Does not bruise/bleed easily.  Psychiatric/Behavioral:  Negative for suicidal ideas.   All other systems reviewed and are negative.  Blood pressure (!) 111/59, pulse 92, temperature 98.3 F (36.8 C), temperature source Oral, resp. rate 14, SpO2 96%. Physical Exam Constitutional:      Appearance: She is well-developed.     Comments: Conversant No acute  distress  HENT:     Head: Normocephalic and atraumatic.  Eyes:     General: Lids are normal. No scleral icterus.    Pupils: Pupils are equal, round, and reactive to light.     Comments: Pupils are equal round and reactive No lid lag Moist conjunctiva  Neck:     Thyroid : No thyromegaly.     Trachea: No tracheal tenderness.     Comments: No cervical lymphadenopathy Cardiovascular:     Rate and Rhythm: Normal rate and regular rhythm.     Heart sounds: No murmur heard. Pulmonary:     Effort: Pulmonary effort is normal.     Breath sounds: Normal breath sounds. No wheezing or rales.  Abdominal:     Tenderness: There is abdominal tenderness in the right upper quadrant, right lower quadrant and epigastric area. There is no guarding or rebound.     Hernia: No hernia is present.    Musculoskeletal:     Cervical back: Normal range of motion and neck supple.  Skin:    General: Skin is warm.     Findings: No rash.     Nails: There is no clubbing.     Comments: Normal skin turgor  Neurological:     Mental Status: She is alert and oriented to person, place, and time.     Comments: Normal gait and station  Psychiatric:        Mood and Affect: Mood normal.        Thought Content: Thought content normal.        Judgment: Judgment normal.     Comments: Appropriate affect     Assessment/Plan: 68 year old female with questionable bowel perforation, gallbladder perforation, colitis. Interview the patient CT scan and her history there is a  possibility she could have perforated a diverticulum which could have walled itself off and also creating an abscess.  I do not feel that the patient's gallbladder was perforated based on her laboratory studies.  Overall patient has no white count which is unusual based on her CT scan and hx.  1.  At this time we will continue with n.p.o. IV fluids.  Agree with antibiotics. 2.  Will obtain PO CONTRASTED CT  to evaluate for possible leak.  I discussed with her based on her labs today as well as her CT scan we will hold off on surgery.  This still may require surgery in the next 24 to 48 hours. 3.  Will reevaluate in the morning and follow along with you.  Lauren [name] 01/26/2023, 7:10 PM

## 2023-01-27 ENCOUNTER — Inpatient Hospital Stay (HOSPITAL_COMMUNITY): Payer: Medicare HMO | Admitting: Certified Registered Nurse Anesthetist

## 2023-01-27 ENCOUNTER — Inpatient Hospital Stay (HOSPITAL_COMMUNITY): Payer: Medicare HMO

## 2023-01-27 ENCOUNTER — Other Ambulatory Visit: Payer: Self-pay

## 2023-01-27 ENCOUNTER — Encounter (HOSPITAL_COMMUNITY): Admission: EM | Disposition: A | Discharge status: Home / Self Care | Payer: Self-pay | Source: Home / Self Care

## 2023-01-27 ENCOUNTER — Encounter (HOSPITAL_COMMUNITY): Payer: Self-pay

## 2023-01-27 DIAGNOSIS — K219 Gastro-esophageal reflux disease without esophagitis: Secondary | ICD-10-CM

## 2023-01-27 DIAGNOSIS — E785 Hyperlipidemia, unspecified: Secondary | ICD-10-CM | POA: Diagnosis not present

## 2023-01-27 DIAGNOSIS — F32A Depression, unspecified: Secondary | ICD-10-CM

## 2023-01-27 DIAGNOSIS — K651 Peritoneal abscess: Secondary | ICD-10-CM | POA: Diagnosis not present

## 2023-01-27 DIAGNOSIS — F331 Major depressive disorder, recurrent, moderate: Secondary | ICD-10-CM | POA: Diagnosis not present

## 2023-01-27 DIAGNOSIS — K631 Perforation of intestine (nontraumatic): Secondary | ICD-10-CM | POA: Diagnosis not present

## 2023-01-27 HISTORY — PX: CHOLECYSTECTOMY: SHX55

## 2023-01-27 LAB — MRSA NEXT GEN BY PCR, NASAL: MRSA by PCR Next Gen: NOT DETECTED

## 2023-01-27 LAB — CBC
HCT: 38.4 % (ref 36.0–46.0)
Hemoglobin: 12.8 g/dL (ref 12.0–15.0)
MCH: 30.5 pg (ref 26.0–34.0)
MCHC: 33.3 g/dL (ref 30.0–36.0)
MCV: 91.6 fL (ref 80.0–100.0)
Platelets: 343 10*3/uL (ref 150–400)
RBC: 4.19 MIL/uL (ref 3.87–5.11)
RDW: 13.1 % (ref 11.5–15.5)
WBC: 15.3 10*3/uL — ABNORMAL HIGH (ref 4.0–10.5)
nRBC: 0 % (ref 0.0–0.2)

## 2023-01-27 LAB — HIV ANTIBODY (ROUTINE TESTING W REFLEX): HIV Screen 4th Generation wRfx: NONREACTIVE

## 2023-01-27 LAB — COMPREHENSIVE METABOLIC PANEL
ALT: 73 U/L — ABNORMAL HIGH (ref 0–44)
AST: 32 U/L (ref 15–41)
Albumin: 2.5 g/dL — ABNORMAL LOW (ref 3.5–5.0)
Alkaline Phosphatase: 96 U/L (ref 38–126)
Anion gap: 10 (ref 5–15)
BUN: 13 mg/dL (ref 8–23)
CO2: 24 mmol/L (ref 22–32)
Calcium: 7.9 mg/dL — ABNORMAL LOW (ref 8.9–10.3)
Chloride: 95 mmol/L — ABNORMAL LOW (ref 98–111)
Creatinine, Ser: 0.67 mg/dL (ref 0.44–1.00)
GFR, Estimated: >60 mL/min (ref >60)
Glucose, Bld: 120 mg/dL — ABNORMAL HIGH (ref 70–99)
Potassium: 3.6 mmol/L (ref 3.5–5.1)
Sodium: 129 mmol/L — ABNORMAL LOW (ref 135–145)
Total Bilirubin: 0.8 mg/dL (ref 0.0–1.2)
Total Protein: 5.9 g/dL — ABNORMAL LOW (ref 6.5–8.1)

## 2023-01-27 SURGERY — LAPAROSCOPIC CHOLECYSTECTOMY
Anesthesia: General

## 2023-01-27 MED ORDER — SUCCINYLCHOLINE CHLORIDE 200 MG/10ML IV SOSY
PREFILLED_SYRINGE | INTRAVENOUS | Status: AC
Start: 2023-01-27 — End: ?
  Filled 2023-01-27: qty 10

## 2023-01-27 MED ORDER — ACETAMINOPHEN 10 MG/ML IV SOLN
INTRAVENOUS | Status: AC
  Filled 2023-01-27: qty 100

## 2023-01-27 MED ORDER — HYDROMORPHONE HCL 1 MG/ML IJ SOLN
INTRAMUSCULAR | Status: AC
  Filled 2023-01-27: qty 1

## 2023-01-27 MED ORDER — LACTATED RINGERS IV SOLN: INTRAVENOUS | Status: DC | PRN

## 2023-01-27 MED ORDER — ACETAMINOPHEN 10 MG/ML IV SOLN
1000.0000 mg | Freq: Four times a day (QID) | INTRAVENOUS | Status: AC
  Administered 2023-01-28 (×2): 1000 mg via INTRAVENOUS
  Filled 2023-01-27 (×3): qty 100

## 2023-01-27 MED ORDER — NALOXONE HCL 0.4 MG/ML IJ SOLN: 0.4000 mg | INTRAMUSCULAR | Status: DC | PRN

## 2023-01-27 MED ORDER — IOHEXOL 300 MG/ML  SOLN
30.0000 mL | Freq: Once | INTRAMUSCULAR | Status: AC | PRN
  Administered 2023-01-27: 30 mL via ORAL

## 2023-01-27 MED ORDER — ACETAMINOPHEN 10 MG/ML IV SOLN
1000.0000 mg | Freq: Once | INTRAVENOUS | Status: DC | PRN
  Administered 2023-01-27: 1000 mg via INTRAVENOUS

## 2023-01-27 MED ORDER — FENTANYL CITRATE PF 50 MCG/ML IJ SOSY
PREFILLED_SYRINGE | INTRAMUSCULAR | Status: AC
  Filled 2023-01-27: qty 1

## 2023-01-27 MED ORDER — FENTANYL CITRATE PF 50 MCG/ML IJ SOSY
PREFILLED_SYRINGE | INTRAMUSCULAR | Status: AC
  Filled 2023-01-27: qty 2

## 2023-01-27 MED ORDER — OXYCODONE HCL 5 MG PO TABS: 5.0000 mg | ORAL_TABLET | Freq: Once | ORAL | Status: DC | PRN

## 2023-01-27 MED ORDER — FENTANYL CITRATE PF 50 MCG/ML IJ SOSY
25.0000 ug | PREFILLED_SYRINGE | INTRAMUSCULAR | Status: DC | PRN
  Administered 2023-01-27 (×3): 50 ug via INTRAVENOUS

## 2023-01-27 MED ORDER — FENTANYL CITRATE (PF) 100 MCG/2ML IJ SOLN
INTRAMUSCULAR | Status: DC | PRN
  Administered 2023-01-27: 100 ug via INTRAVENOUS

## 2023-01-27 MED ORDER — VENLAFAXINE HCL ER 150 MG PO CP24: 150.0000 mg | ORAL_CAPSULE | Freq: Every day | ORAL | Status: DC

## 2023-01-27 MED ORDER — CHLORHEXIDINE GLUCONATE CLOTH 2 % EX PADS
6.0000 | MEDICATED_PAD | Freq: Every day | CUTANEOUS | Status: DC
  Administered 2023-01-27 – 2023-02-05 (×9): 6 via TOPICAL

## 2023-01-27 MED ORDER — HYDROMORPHONE HCL 1 MG/ML IJ SOLN
0.2500 mg | INTRAMUSCULAR | Status: DC | PRN
  Administered 2023-01-27 (×4): 0.5 mg via INTRAVENOUS

## 2023-01-27 MED ORDER — IOHEXOL 300 MG/ML  SOLN
100.0000 mL | Freq: Once | INTRAMUSCULAR | Status: AC | PRN
  Administered 2023-01-27: 100 mL via INTRAVENOUS

## 2023-01-27 MED ORDER — INDOCYANINE GREEN 25 MG IV SOLR
1.2500 mg | Freq: Once | INTRAVENOUS | Status: AC
  Administered 2023-01-27: 1.25 mg via INTRAVENOUS
  Filled 2023-01-27: qty 10

## 2023-01-27 MED ORDER — SUGAMMADEX SODIUM 200 MG/2ML IV SOLN
INTRAVENOUS | Status: DC | PRN
  Administered 2023-01-27: 200 mg via INTRAVENOUS

## 2023-01-27 MED ORDER — BUPIVACAINE-EPINEPHRINE 0.25% -1:200000 IJ SOLN
INTRAMUSCULAR | Status: AC
Start: 2023-01-27 — End: ?
  Filled 2023-01-27: qty 1

## 2023-01-27 MED ORDER — SODIUM CHLORIDE 0.9 % IV SOLN: INTRAVENOUS | Status: AC

## 2023-01-27 MED ORDER — LIDOCAINE HCL (CARDIAC) PF 100 MG/5ML IV SOSY
PREFILLED_SYRINGE | INTRAVENOUS | Status: DC | PRN
  Administered 2023-01-27: 40 mg via INTRAVENOUS

## 2023-01-27 MED ORDER — SODIUM CHLORIDE 0.9% FLUSH: 9.0000 mL | INTRAVENOUS | Status: DC | PRN

## 2023-01-27 MED ORDER — CHLORHEXIDINE GLUCONATE 0.12 % MT SOLN
15.0000 mL | Freq: Once | OROMUCOSAL | Status: AC
  Administered 2023-01-27: 15 mL via OROMUCOSAL
  Filled 2023-01-27: qty 15

## 2023-01-27 MED ORDER — ROCURONIUM BROMIDE 10 MG/ML (PF) SYRINGE
PREFILLED_SYRINGE | INTRAVENOUS | Status: AC
  Filled 2023-01-27: qty 10

## 2023-01-27 MED ORDER — MIDAZOLAM HCL 2 MG/2ML IJ SOLN
INTRAMUSCULAR | Status: AC
  Filled 2023-01-27: qty 2

## 2023-01-27 MED ORDER — DIPHENHYDRAMINE HCL 50 MG/ML IJ SOLN: 12.5000 mg | Freq: Four times a day (QID) | INTRAMUSCULAR | Status: DC | PRN

## 2023-01-27 MED ORDER — VENLAFAXINE HCL ER 37.5 MG PO CP24
225.0000 mg | ORAL_CAPSULE | Freq: Every day | ORAL | Status: DC
  Administered 2023-01-28 – 2023-02-05 (×9): 225 mg via ORAL
  Filled 2023-01-27: qty 3
  Filled 2023-01-27 (×6): qty 2
  Filled 2023-01-27: qty 3
  Filled 2023-01-27: qty 2

## 2023-01-27 MED ORDER — ROCURONIUM BROMIDE 100 MG/10ML IV SOLN
INTRAVENOUS | Status: DC | PRN
  Administered 2023-01-27: 40 mg via INTRAVENOUS
  Administered 2023-01-27: 10 mg via INTRAVENOUS

## 2023-01-27 MED ORDER — PROPOFOL 10 MG/ML IV BOLUS
INTRAVENOUS | Status: DC | PRN
  Administered 2023-01-27: 120 mg via INTRAVENOUS

## 2023-01-27 MED ORDER — DIPHENHYDRAMINE HCL 12.5 MG/5ML PO ELIX: 12.5000 mg | ORAL_SOLUTION | Freq: Four times a day (QID) | ORAL | Status: DC | PRN

## 2023-01-27 MED ORDER — PROPOFOL 10 MG/ML IV BOLUS
INTRAVENOUS | Status: AC
  Filled 2023-01-27: qty 20

## 2023-01-27 MED ORDER — METHOCARBAMOL 1000 MG/10ML IJ SOLN
1000.0000 mg | Freq: Three times a day (TID) | INTRAMUSCULAR | Status: DC
  Administered 2023-01-27 – 2023-02-02 (×17): 1000 mg via INTRAVENOUS
  Filled 2023-01-27 (×19): qty 10

## 2023-01-27 MED ORDER — HYDROMORPHONE 1 MG/ML IV SOLN
INTRAVENOUS | Status: DC
  Administered 2023-01-27: 30 mg via INTRAVENOUS
  Administered 2023-01-28: 2.4 mg via INTRAVENOUS
  Administered 2023-01-28: 1.5 mg via INTRAVENOUS
  Administered 2023-01-28: 3.3 mg via INTRAVENOUS
  Administered 2023-01-29: 1.2 mg via INTRAVENOUS
  Administered 2023-01-29: 1.8 mg via INTRAVENOUS
  Administered 2023-01-29: 30 mg via INTRAVENOUS
  Administered 2023-01-29 (×2): 1.5 mg via INTRAVENOUS
  Administered 2023-01-29: 1 mg via INTRAVENOUS
  Administered 2023-01-30: 2.4 mg via INTRAVENOUS
  Administered 2023-01-30: 2.7 mg via INTRAVENOUS
  Administered 2023-01-30: 0.6 mg via INTRAVENOUS
  Filled 2023-01-27 (×2): qty 30

## 2023-01-27 MED ORDER — 0.9 % SODIUM CHLORIDE (POUR BTL) OPTIME
TOPICAL | Status: DC | PRN
  Administered 2023-01-27: 1000 mL

## 2023-01-27 MED ORDER — PHENYLEPHRINE 80 MCG/ML (10ML) SYRINGE FOR IV PUSH (FOR BLOOD PRESSURE SUPPORT)
PREFILLED_SYRINGE | INTRAVENOUS | Status: DC | PRN
  Administered 2023-01-27 (×3): 160 ug via INTRAVENOUS
  Administered 2023-01-27: 80 ug via INTRAVENOUS

## 2023-01-27 MED ORDER — LIDOCAINE HCL (PF) 2 % IJ SOLN
INTRAMUSCULAR | Status: AC
  Filled 2023-01-27: qty 5

## 2023-01-27 MED ORDER — OXYCODONE HCL 5 MG/5ML PO SOLN: 5.0000 mg | Freq: Once | ORAL | Status: DC | PRN

## 2023-01-27 MED ORDER — FENTANYL CITRATE (PF) 100 MCG/2ML IJ SOLN
INTRAMUSCULAR | Status: AC
  Filled 2023-01-27: qty 2

## 2023-01-27 MED ORDER — ONDANSETRON HCL 4 MG/2ML IJ SOLN
INTRAMUSCULAR | Status: DC | PRN
  Administered 2023-01-27: 4 mg via INTRAVENOUS

## 2023-01-27 MED ORDER — LACTATED RINGERS IV SOLN
INTRAVENOUS | Status: DC
Start: 2023-01-27 — End: 2023-01-28

## 2023-01-27 SURGICAL SUPPLY — 54 items
APPLIER CLIP 13 LRG OPEN (CLIP) ×1 IMPLANT
APPLIER CLIP 9.375 MED OPEN (MISCELLANEOUS) ×1 IMPLANT
BAG COUNTER SPONGE SURGICOUNT (BAG) IMPLANT
BIOPATCH WHT 1IN DISK W/4.0 H (GAUZE/BANDAGES/DRESSINGS) IMPLANT
BLADE EXTENDED COATED 6.5IN (ELECTRODE) IMPLANT
CHLORAPREP W/TINT 26 (MISCELLANEOUS) ×1 IMPLANT
CLIP APPLIE 13 LRG OPEN (CLIP) IMPLANT
CLIP APPLIE 9.375 MED OPEN (MISCELLANEOUS) IMPLANT
COVER MAYO STAND STRL (DRAPES) ×3 IMPLANT
COVER SURGICAL LIGHT HANDLE (MISCELLANEOUS) ×1 IMPLANT
DRAIN CHANNEL JP 19F 1/4 (MISCELLANEOUS) IMPLANT
DRAPE LAPAROSCOPIC ABDOMINAL (DRAPES) ×1 IMPLANT
DRSG OPSITE POSTOP 4X10 (GAUZE/BANDAGES/DRESSINGS) IMPLANT
DRSG OPSITE POSTOP 4X6 (GAUZE/BANDAGES/DRESSINGS) IMPLANT
DRSG OPSITE POSTOP 4X8 (GAUZE/BANDAGES/DRESSINGS) IMPLANT
DRSG TEGADERM 2-3/8X2-3/4 SM (GAUZE/BANDAGES/DRESSINGS) IMPLANT
DRSG TEGADERM 4X4.75 (GAUZE/BANDAGES/DRESSINGS) IMPLANT
ELECT BLADE TIP CTD 4 INCH (ELECTRODE) IMPLANT
ELECT REM PT RETURN 15FT ADLT (MISCELLANEOUS) ×1 IMPLANT
EVACUATOR SILICONE 100CC (DRAIN) IMPLANT
GAUZE SPONGE 2X2 8PLY STRL LF (GAUZE/BANDAGES/DRESSINGS) IMPLANT
GAUZE SPONGE 4X4 12PLY STRL (GAUZE/BANDAGES/DRESSINGS) IMPLANT
GLOVE BIO SURGEON STRL SZ7 (GLOVE) ×2 IMPLANT
GLOVE BIOGEL PI IND STRL 7.5 (GLOVE) ×2 IMPLANT
GOWN STRL REUS W/ TWL LRG LVL3 (GOWN DISPOSABLE) ×2 IMPLANT
GRASPER SUT TROCAR 14GX15 (MISCELLANEOUS) IMPLANT
HANDLE SUCTION POOLE (INSTRUMENTS) IMPLANT
IRRIG SUCT STRYKERFLOW 2 WTIP (MISCELLANEOUS) ×1 IMPLANT
IRRIGATION SUCT STRKRFLW 2 WTP (MISCELLANEOUS) IMPLANT
KIT TURNOVER KIT A (KITS) IMPLANT
PACK COLON (CUSTOM PROCEDURE TRAY) ×1 IMPLANT
PENCIL SMOKE EVACUATOR (MISCELLANEOUS) IMPLANT
POUCH RETRIEVAL ECOSAC 10 (ENDOMECHANICALS) IMPLANT
SCISSORS LAP 5X35 DISP (ENDOMECHANICALS) IMPLANT
SET TUBE SMOKE EVAC HIGH FLOW (TUBING) IMPLANT
SLEEVE Z-THREAD 5X100MM (TROCAR) IMPLANT
STAPLER SKIN PROX WIDE 3.9 (STAPLE) ×1 IMPLANT
SUCTION POOLE HANDLE (INSTRUMENTS) ×1 IMPLANT
SUT ETHILON 2 0 PS N (SUTURE) IMPLANT
SUT ETHILON 3 0 PS 1 (SUTURE) IMPLANT
SUT NOVA NAB GS-21 0 18 T12 DT (SUTURE) IMPLANT
SUT NOVA NAB GS-21 1 T12 (SUTURE) IMPLANT
SUT PDS AB 1 TP1 96 (SUTURE) IMPLANT
SUT SILK 2 0 SH CR/8 (SUTURE) ×1 IMPLANT
SUT SILK 2-0 18XBRD TIE 12 (SUTURE) ×1 IMPLANT
SUT SILK 3 0 SH CR/8 (SUTURE) ×1 IMPLANT
SUT SILK 3-0 18XBRD TIE 12 (SUTURE) ×1 IMPLANT
SUT VICRYL 0 UR6 27IN ABS (SUTURE) IMPLANT
SYR BULB IRRIG 60ML STRL (SYRINGE) IMPLANT
TOWEL OR 17X26 10 PK STRL BLUE (TOWEL DISPOSABLE) IMPLANT
TRAY FOLEY MTR SLVR 14FR STAT (SET/KITS/TRAYS/PACK) IMPLANT
TRAY FOLEY MTR SLVR 16FR STAT (SET/KITS/TRAYS/PACK) IMPLANT
TROCAR BALLN 12MMX100 BLUNT (TROCAR) IMPLANT
TROCAR Z-THREAD OPTICAL 5X100M (TROCAR) IMPLANT

## 2023-01-27 NOTE — Anesthesia Postprocedure Evaluation (Signed)
 Anesthesia Post Note  Patient: Lauren [name]  Procedure(s) Performed: OPEN CHOLECYSTECTOMY, DRAINAGE OF INTRA-ABDOMINAL ABSCESS     Patient location during evaluation: PACU Anesthesia Type: General Level of consciousness: awake and alert Pain management: pain level controlled Vital Signs Assessment: post-procedure vital signs reviewed and stable Respiratory status: spontaneous breathing, nonlabored ventilation, respiratory function stable and patient connected to nasal cannula oxygen Cardiovascular status: blood pressure returned to baseline and stable Postop Assessment: no apparent nausea or vomiting Anesthetic complications: no   No notable events documented.  Last Vitals:  Vitals:   01/27/23 1944 01/27/23 2004  BP:    Pulse:    Resp: 16   Temp:  36.5 C  SpO2:      Last Pain:  Vitals:   01/27/23 2004  TempSrc: Axillary  PainSc:                  [name]

## 2023-01-27 NOTE — Progress Notes (Signed)
 Patient ID: Lauren [name], female   DOB: August 14, 1955, 68 y.o.   MRN: [id number] Ct reviewed with contrast. This does appear to be perforated cholecystitis. I discussed with she and her husband going to the OR today for laparoscopic, possible open, cholecystectomy we discussed risks including but not limited to bleeding, infection, open procedure, injury to surrounding structures, subtotal requiring additional procedures among others. Will proceed today

## 2023-01-27 NOTE — Progress Notes (Signed)
 Progress Note     Subjective: Pt reports ongoing generalized abdominal pain. Drinking CT contrast now. No nausea or vomiting but pain is persistent. Tachycardic, normotensive. She reports her husband will be here around 0900 and she understands that she likely needs to proceed to OR today.   Objective: Vital signs in last 24 hours: Temp:  [97.6 F (36.4 C)-99 F (37.2 C)] 99 F (37.2 C) (01/14 0300) Pulse Rate:  [92-109] 102 (01/14 0600) Resp:  [13-26] 21 (01/14 0600) BP: (90-148)/(35-116) 121/56 (01/14 0600) SpO2:  [94 %-100 %] 97 % (01/14 0600) Last BM Date :  (PTA)  Intake/Output from previous day: 01/13 0701 - 01/14 0700 In: 1276.3 [I.V.:160.2; IV Piggyback:1116.1] Out: -  Intake/Output this shift: No intake/output data recorded.  PE: General: pleasant, WD, WN female who is laying in bed, non toxic appearing  HEENT: sclera anicteric  Heart: sinus tachycardia in the 110s Lungs: Respiratory effort nonlabored Abd: soft, generalized ttp with peritonitis, mild distention, upper midline surgical scar and left sided scar from previous j tube Psych: A&Ox3 with an appropriate affect.    Lab Results:  Recent Labs    01/26/23 1214 01/27/23 0308  WBC 7.7 15.3*  HGB 14.6 12.8  HCT 43.4 38.4  PLT 356 343   BMET Recent Labs    01/26/23 1214 01/27/23 0308  NA 133* 129*  K 4.2 3.6  CL 95* 95*  CO2 26 24  GLUCOSE 183* 120*  BUN 13 13  CREATININE 0.80 0.67  CALCIUM  9.2 7.9*   PT/INR No results for input(s): LABPROT, INR in the last 72 hours. CMP     Component Value Date/Time   NA 129 (L) 01/27/2023 0308   K 3.6 01/27/2023 0308   CL 95 (L) 01/27/2023 0308   CO2 24 01/27/2023 0308   GLUCOSE 120 (H) 01/27/2023 0308   BUN 13 01/27/2023 0308   CREATININE 0.67 01/27/2023 0308   CREATININE 0.81 01/23/2015 1022   CALCIUM  7.9 (L) 01/27/2023 0308   PROT 5.9 (L) 01/27/2023 0308   ALBUMIN 2.5 (L) 01/27/2023 0308   AST 32 01/27/2023 0308   ALT 73 (H) 01/27/2023  0308   ALKPHOS 96 01/27/2023 0308   BILITOT 0.8 01/27/2023 0308   GFRNONAA >60 01/27/2023 0308   GFRAA  05/03/2008 0425    >60        The eGFR has been calculated using the MDRD equation. This calculation has not been validated in all clinical situations. eGFR's persistently <60 mL/min signify possible Chronic Kidney Disease.   Lipase     Component Value Date/Time   LIPASE 10 (L) 01/26/2023 1214       Studies/Results: CT ABDOMEN PELVIS W CONTRAST Result Date: 01/26/2023 CLINICAL DATA:  68 year old female with abdominal pain EXAM: CT ABDOMEN AND PELVIS WITH CONTRAST TECHNIQUE: Multidetector CT imaging of the abdomen and pelvis was performed using the standard protocol following bolus administration of intravenous contrast. RADIATION DOSE REDUCTION: This exam was performed according to the departmental dose-optimization program which includes automated exposure control, adjustment of the mA and/or kV according to patient size and/or use of iterative reconstruction technique. CONTRAST:  80mL OMNIPAQUE  IOHEXOL  300 MG/ML  SOLN COMPARISON:  CT 01/27/2008 FINDINGS: Lower chest: Scarring at the left medial lung base Hepatobiliary: Hepatic parenchyma relatively unremarkable. There is intrahepatic ductal dilatation of both left and right bile ducts. The common hepatic duct and common bile duct are dilated. No radiopaque stones. There is abrupt transition at the ampulla. Additionally the pancreatic duct is  dilated. Extensive in circumferential gallbladder wall thickening and enhancement. The gallbladder is relatively elongated and distended with some high density/enhancing stones versus polyps. There is evidence of gallbladder wall rupture towards the right side with adjacent low-density fluid and peritoneal enhancement. This fluid collection to the right of the gallbladder is estimated on coronal images 7.1 cm by 3.0 cm. A second fluid and gas collection interposed between the gallbladder and the  duodenum is estimated 4.6 cm x 2.3 cm. Pancreas: Pancreatic tissue relatively uniform, though not well evaluated at the head of the pancreas secondary to extensive adjacent inflammatory changes and multi spatial abscess. Pancreatic duct is enlarged. Spleen: Unremarkable Adrenals/Urinary Tract: - Right adrenal gland:  Unremarkable - Left adrenal gland: Unremarkable. - Right kidney: No hydronephrosis, nephrolithiasis, inflammation, or ureteral dilation. No focal lesion. - Left Kidney: No hydronephrosis, nephrolithiasis, inflammation, or ureteral dilation. 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 - Urinary Bladder: Unremarkable. Stomach/Bowel: - Stomach: Surgical changes of esophagectomy and pull-through - Small bowel: Duodenum is adjacent to multifocal abscess. There is questionable defect in the left wall of the duodenum (image 36 of series 2, axial images). This is continuous with the fluid and gas collection adjacent to small bowel. - Appendix: Normal. - Colon: Secondary inflammatory changes in the wall of the right colon. The right colon, transverse colon, left colon are fluid-filled. Left-sided diverticular disease, particularly in the sigmoid colon. No evidence of obstruction. Vascular/Lymphatic: Significant irregular soft plaque of the distal thoracic and upper abdominal aorta. No dissection. Ectasias of the infrarenal abdominal aorta, with transverse diameter measuring 22 mm at the site of saccular outpouching. Mixed calcified and soft plaque throughout the abdominal aorta, with no pedunculated plaque. Bilateral iliac arteries and proximal femoral arteries patent. Mesenteric arteries are patent.  Bilateral renal arteries patent. Unremarkable venous system. Multiple lymph nodes in the upper abdomen and the retroperitoneal region. Index node in the gastrohepatic ligament measures 7 mm. Subhepatic lymph node on image 31 measures 7 mm. Multiple borderline enlarged lymph nodes in the  preaortic/periaortic nodal stations, as well as in the portal caval nodal stations. Reproductive: Hysterectomy Other: Low-density interloop fluid of the small bowel loops as well as ascitic fluid in the pelvis. Associated peritoneal enhancement in the pelvis. Musculoskeletal: No acute displaced fracture. Degenerative changes of the visualized thoracolumbar spine. Degenerative disc disease at L2-L3 and L5-S1 with no bony canal narrowing. IMPRESSION: Multifocal fluid and gas collection of the upper abdomen surrounding the duodenum, potentially representing hollow viscus perforation and/or abscess. There is abnormal appearance of the wall of proximal duodenum on the CT, potentially site of ulceration/perforation. Additionally there is acute cholecystitis with suspected gallbladder wall perforation, contributing 2 complex biloma adjacent to the fundus of the gallbladder, suspected bilious ascites, and CT evidence of bile peritonitis of the abdomen/pelvis. Emergent referral for surgical evaluation is indicated given the above. Although there are high density/enhancing foci in the gallbladder lumen, potentially polyps or gallstones, there is no high density foci within the common bile duct to account for both intrahepatic and extrahepatic biliary ductal dilatation. There is, however, tapering at the distal common bile duct and an ampullary stricture or tumor cannot be excluded. This site may be best evaluated with ERCP, less helpful would be MRCP at this time. Borderline enlarged lymph nodes of the gastrohepatic ligament, subhepatic region, portacaval nodal stations, and retroperitoneum. While these may be reactive given the significant inflammation, a malignant process cannot be excluded. Surgical changes of esophagectomy and pull-through. These above results  discussed by telephone at the time of interpretation on 01/26/2023 at 3:23 pm with APP provider, Darryle, caring for the patient. The right colon wall is edematous and  inflamed, presumably reactive given the adjacent presumed biliary ascites. Fluid-filled colon may represent nonspecific enteritis/colitis. Aortic atherosclerosis. 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 Ambulatory referral to vascular team to initiate surveillance would be recommended given the configuration. Additional ancillary findings as above. Signed, Ami RAMAN. Alona ROSALEA GRAVER, RPVI [facility] Electronically Signed   By: Ami Alona D.O.   On: 01/26/2023 15:24    Anti-infectives: Anti-infectives (From admission, onward)    Start     Dose/Rate Route Frequency Ordered Stop   01/26/23 2200  piperacillin -tazobactam (ZOSYN ) IVPB 3.375 g        3.375 g 12.5 mL/hr over 240 Minutes Intravenous Every 8 hours 01/26/23 1532     01/26/23 1926  metroNIDAZOLE  (FLAGYL ) IVPB 500 mg  Status:  Discontinued        500 mg 100 mL/hr over 60 Minutes Intravenous Every 12 hours 01/26/23 1926 01/26/23 1934   01/26/23 1545  piperacillin -tazobactam (ZOSYN ) IVPB 3.375 g        3.375 g 100 mL/hr over 30 Minutes Intravenous  Once 01/26/23 1531 01/26/23 1612        Assessment/Plan Perforated viscus with abscess - not clear whether this is coming from duodenum vs colon vs gallbladder - normotensive and not altered currently, tachycardia, afebrile - leukocytosis of 15K - getting stat CT with PO contrast to try to clarify but anticipate OR later today with washout and drain placement and possible bowel resection, possible j tube placement Hyponatremia - Na 129 this AM  FEN: NPO, IVF@100  cc/h VTE: SCDs ID: Zosyn  1/13>>  - per [facility] -  Hx of esophageal cancer s/p resection  HTN HLD GERD Depression   LOS: 1 day   I reviewed hospitalist notes, last 24 h vitals and pain scores, last 48 h intake and output, last 24 h labs and trends, and last 24 h imaging results.  This  care required high  level of medical decision making.    Burnard JONELLE Louder, [facility] Surgery 01/27/2023, 7:43 AM Please see Amion for pager number during day hours 7:00am-4:30pm

## 2023-01-27 NOTE — Anesthesia Procedure Notes (Signed)
 Procedure Name: Intubation Date/Time: 01/27/2023 12:41 PM  Performed by: Chae Oommen, Corean BROCKS, CRNAPre-anesthesia Checklist: Patient identified, Emergency Drugs available, Suction available and Patient being monitored Patient Re-evaluated:Patient Re-evaluated prior to induction Oxygen Delivery Method: Circle system utilized Preoxygenation: Pre-oxygenation with 100% oxygen Induction Type: IV induction Ventilation: Mask ventilation without difficulty Laryngoscope Size: Mac and 3 Grade View: Grade I Tube type: Oral Tube size: 7.0 mm Number of attempts: 1 Airway Equipment and Method: Stylet and Oral airway Placement Confirmation: ETT inserted through vocal cords under direct vision, positive ETCO2 and breath sounds checked- equal and bilateral Tube secured with: Tape Dental Injury: Teeth and Oropharynx as per pre-operative assessment

## 2023-01-27 NOTE — Plan of Care (Signed)
  Problem: Education: Goal: Knowledge of General Education information will improve Description: Including pain rating scale, medication(s)/side effects and non-pharmacologic comfort measures Outcome: Progressing   Problem: Clinical Measurements: Goal: Respiratory complications will improve Outcome: Progressing Goal: Cardiovascular complication will be avoided Outcome: Progressing   Problem: Elimination: Goal: Will not experience complications related to urinary retention Outcome: Progressing   Problem: Skin Integrity: Goal: Risk for impaired skin integrity will decrease Outcome: Progressing

## 2023-01-27 NOTE — Op Note (Signed)
 Preoperative diagnosis: Intra-abdominal abscesses, likely perforated cholecystitis Postoperative diagnosis: Same as above Procedure: 1.  Diagnostic laparoscopy 2.  Open cholecystectomy with drainage of intra-abdominal abscess Surgeon: Dr. Adina Bury Assistant: Burnard Louder, PA-C Estimated blood loss: 100 cc Anesthesia General Complications: None Drains: 19 French Blake drain to right upper quadrant Specimens: Gallbladder and contents to pathology Sponge needle count was incorrect at the end of the operation, there was 1 extra malleable fluid which we are taking an x-ray Disposition recovery stable condition  Indications: This 68 year old female with a history of esophageal cancer status post transhiatal esophagectomy about 15 years ago.  She is doing fine from that.  She also a history of gallstones for which she has had an ERCP.  She presents with about a 3 to 4-day history of abdominal pain.  On evaluation it appears that on a contrasted CT scan that she has perforated cholecystitis with leakage of bile into her abdomen.  We discussed proceeding to the operating room.  Procedure: After informed consent was obtained she was taken to the operating room.  Antibiotics were given.  SCDs were in place.  She was placed under general anesthesia without complication.  She was prepped and draped in standard sterile surgical fashion.  Surgical timeout was then performed.  I used the inferior most part of her esophagectomy incision.  I grasped the fascia and incised this sharply I was able to enter into the abdomen.  Superior to that there were a lot of adhesions.  Soon as I enter into the abdomen there was about 500 cc of bile that came out.  I elected at that point given the adhesions and the findings to just do an open operation.  I then made a right upper quadrant incision.  I carried this through the fascia and divided the rectus with cautery.  I entered into the abdomen.  There was at least  another of liter of bile and purulence present that I washed out.  Initially was very hard to identify her gallbladder.  Her bowel had a lot of exudate present.  Eventually I was able to identify the gallbladder and it had perforated near the dome.  Some of this was contained by the colon.  I was able to free it from the colon.  I was concerned that there might be a fistula to the colon.  However after irrigation and examining this several times I think this was just rind on the colon there is not an entrance into the colon.  I then proceeded to take the gallbladder down with cautery from the liver.  She had a very elongated gallbladder.  I was able to then identify in the triangle both the cystic duct and cystic artery.  I clipped the cystic artery and divided it leaving 2 clips in place.  The cystic duct was clipped and then I also oversewed this with a 0 Vicryl suture.  I then irrigated.  This was all hemostatic.  I placed a 7 French Blake drain.  And secured this with a 2-0 nylon.  I then irrigated the abdomen.  I then closed my umbilical incision using the suture passer device under direct vision with a 0 Vicryl suture.  I then closed the right upper quadrant incision with #1 looped PDS.  I stapled both incisions.  Dressings were placed.  She tolerated this well was extubated transferred recovery stable.

## 2023-01-27 NOTE — Anesthesia Preprocedure Evaluation (Signed)
 Anesthesia Evaluation  Patient identified by MRN, date of birth, ID band Patient awake    Reviewed: Allergy & Precautions, NPO status , Patient's Chart, lab work & pertinent test results  Airway Mallampati: I  TM Distance: >3 FB Neck ROM: Full    Dental no notable dental hx. (+) Teeth Intact, Dental Advisory Given   Pulmonary former smoker   Pulmonary exam normal breath sounds clear to auscultation       Cardiovascular Exercise Tolerance: Good Normal cardiovascular exam Rhythm:Regular Rate:Normal     Neuro/Psych  PSYCHIATRIC DISORDERS  Depression     Neuromuscular disease    GI/Hepatic hiatal hernia,GERD  ,,Esophageal CA HX POSSIBLE PERFORATED GALLBLADDER   Endo/Other    Renal/GU      Musculoskeletal  (+) Arthritis ,    Abdominal Normal abdominal exam  (+)   Peds  Hematology   Anesthesia Other Findings   Reproductive/Obstetrics                              Anesthesia Physical Anesthesia Plan  ASA: 3  Anesthesia Plan: General   Post-op Pain Management:    Induction: Intravenous  PONV Risk Score and Plan: 3 and Treatment may vary due to age or medical condition, Ondansetron  and Dexamethasone   Airway Management Planned: Oral ETT  Additional Equipment: None  Intra-op Plan:   Post-operative Plan: Extubation in OR  Informed Consent: I have reviewed the patients History and Physical, chart, labs and discussed the procedure including the risks, benefits and alternatives for the proposed anesthesia with the patient or authorized representative who has indicated his/her understanding and acceptance.     Dental advisory given  Plan Discussed with: CRNA  Anesthesia Plan Comments:          Anesthesia Quick Evaluation

## 2023-01-27 NOTE — Transfer of Care (Signed)
 Immediate Anesthesia Transfer of Care Note  Patient: Lauren [name]  Procedure(s) Performed: OPEN CHOLECYSTECTOMY, DRAINAGE OF INTRA-ABDOMINAL ABSCESS  Patient Location: PACU  Anesthesia Type:General  Level of Consciousness: awake, alert , and oriented  Airway & Oxygen Therapy: Patient Spontanous Breathing and Patient connected to face mask oxygen  Post-op Assessment: Report given to RN and Post -op Vital signs reviewed and stable  Post vital signs: Reviewed and stable  Last Vitals:  Vitals Value Taken Time  BP 122/75 01/27/23 1446  Temp    Pulse    Resp 19 01/27/23 1447  SpO2    Vitals shown include unfiled device data.  Last Pain:  Vitals:   01/27/23 1143  TempSrc: Oral  PainSc: 6       Patients Stated Pain Goal: 5 (01/27/23 1143)  Complications: No notable events documented.

## 2023-01-27 NOTE — Progress Notes (Addendum)
 PROGRESS NOTE  Lauren [name] FMW:[id number] DOB: 1955-05-21 DOA: 01/26/2023 PCP: Domenica Harlene LABOR, MD   LOS: 1 day   Brief narrative:  68 yo female with past medical history of prediabetes,hyperlipidemia presented to hospital with abdominal pain nausea vomiting diarrhea, for 1 week initially started with nausea and vomiting.  In the ED, vitals stable except for tachycardia.  Labs with mild hyponatremia hypomagnesemia with magnesium  level of 1.4.  Elevated LFTs.  HIV was nonreactive.  Urinalysis was negative.  CT abdomen done in the ED showed bowel  perforation and walled off abscess as well as cholecystitis and gallbladder perforation.  Lipase was 10.  Narrowing of CBD-?malignancy unclear.  General surgery  was consulted and patient was admitted hospital for further evaluation and treatment   Assessment and Plan:  Acute cholecystitis with gallbladder perforation with possible walled off abscess. Continue IV antibiotic, general surgery on board.  Patient to undergo laparoscopic cholecystectomy today.[name]   leukocytosis with WBC at 15.3.  Lipase was 10.  Follow general surgery recommendation.  Hyponatremia.  Continue with IV fluids.  Check BMP in AM.  IV fluids changed to normal saline from lactated Ringer .  Hypomagnesemia  Replenished.  Initial magnesium  of 1.4.  Check magnesium  level in AM.  Elevated LFTs. Will continue to monitor.  Repeat LFTs  Hyponatremia.   Continue with IV fluids.  Check BMP in AM.  Initial sodium of 133.  DVT prophylaxis: SCDs Start: 01/26/23 1906   Disposition: Home likely in 1 to 2 days when okay with surgery.  Status is: Inpatient Remains inpatient appropriate because: Need for laparoscopic cholecystectomy    Code Status:     Code Status: Full Code  Family Communication: None at bedside  Consultants: General surgery  Procedures: For laparoscopic cholecystectomy today  Anti-infectives:  Zosyn   Anti-infectives (From admission, onward)     Start     Dose/Rate Route Frequency Ordered Stop   01/26/23 2200  [MAR Hold]  piperacillin -tazobactam (ZOSYN ) IVPB 3.375 g        (MAR Hold since Tue 01/27/2023 at 1142.Hold Reason: Transfer to a Procedural area)   3.375 g 12.5 mL/hr over 240 Minutes Intravenous Every 8 hours 01/26/23 1532     01/26/23 1926  metroNIDAZOLE  (FLAGYL ) IVPB 500 mg  Status:  Discontinued        500 mg 100 mL/hr over 60 Minutes Intravenous Every 12 hours 01/26/23 1926 01/26/23 1934   01/26/23 1545  piperacillin -tazobactam (ZOSYN ) IVPB 3.375 g        3.375 g 100 mL/hr over 30 Minutes Intravenous  Once 01/26/23 1531 01/26/23 1612        Subjective: Today, patient was seen and examined at bedside.  Patient about to be wheeled for surgery today.  Complains of mild abdominal pain.  Objective: Vitals:   01/27/23 0900 01/27/23 1143  BP:  107/62  Pulse: (!) 103 (!) 109  Resp: (!) 23 18  Temp:  98.6 F (37 C)  SpO2: 97% 96%    Intake/Output Summary (Last 24 hours) at 01/27/2023 1330 Last data filed at 01/27/2023 0006 Gross per 24 hour  Intake 1276.25 ml  Output --  Net 1276.25 ml   Filed Weights   01/27/23 1143  Weight: 55.3 kg   Body mass index is 20.94 kg/m.   Physical Exam: Patient being wheeled to the OR.  Data Review: I have personally reviewed the following laboratory data and studies,  CBC: Recent Labs  Lab 01/26/23 1214 01/27/23 0308  WBC 7.7 15.3*  NEUTROABS 6.6  --   HGB 14.6 12.8  HCT 43.4 38.4  MCV 88.9 91.6  PLT 356 343   Basic Metabolic Panel: Recent Labs  Lab 01/26/23 1214 01/27/23 0308  NA 133* 129*  K 4.2 3.6  CL 95* 95*  CO2 26 24  GLUCOSE 183* 120*  BUN 13 13  CREATININE 0.80 0.67  CALCIUM  9.2 7.9*  MG 1.4*  --    Liver Function Tests: Recent Labs  Lab 01/26/23 1214 01/27/23 0308  AST 64* 32  ALT 101* 73*  ALKPHOS 132* 96  BILITOT 0.9 0.8  PROT 6.7 5.9*  ALBUMIN 3.5 2.5*   Recent Labs  Lab 01/26/23 1214  LIPASE 10*   No results for input(s):  AMMONIA in the last 168 hours. Cardiac Enzymes: No results for input(s): CKTOTAL, CKMB, CKMBINDEX, TROPONINI in the last 168 hours. BNP (last 3 results) No results for input(s): BNP in the last 8760 hours.  ProBNP (last 3 results) No results for input(s): PROBNP in the last 8760 hours.  CBG: No results for input(s): GLUCAP in the last 168 hours. Recent Results (from the past 240 hours)  MRSA Next Gen by PCR, Nasal     Status: None   Collection Time: 01/27/23  3:10 AM   Specimen: Nasal Mucosa; Nasal Swab  Result Value Ref Range Status   MRSA by PCR Next Gen NOT DETECTED NOT DETECTED Final    Comment: (NOTE) The GeneXpert MRSA Assay (FDA approved for NASAL specimens only), is one component of a comprehensive MRSA colonization surveillance program. It is not intended to diagnose MRSA infection nor to guide or monitor treatment for MRSA infections. Test performance is not FDA approved in patients less than 93 years old. Performed at [facility], [address], [place], [place] [address]      Studies: CT ABDOMEN PELVIS W CONTRAST Result Date: 01/27/2023 CLINICAL DATA:  Intra-abdominal infection/peritonitis bowel perforation. EXAM: CT ABDOMEN AND PELVIS WITH CONTRAST TECHNIQUE: Multidetector CT imaging of the abdomen and pelvis was performed using the standard protocol following bolus administration of intravenous contrast. RADIATION DOSE REDUCTION: This exam was performed according to the departmental dose-optimization program which includes automated exposure control, adjustment of the mA and/or kV according to patient size and/or use of iterative reconstruction technique. CONTRAST:  OMNIPAQUE  IOHEXOL  300 MG/ML  SOLN COMPARISON:  CT abdomen pelvis dated 01/26/2023. FINDINGS: Lower chest: Small left pleural effusion and left lung base partial atelectasis or infiltrate. The visualized right lung is clear. Hepatobiliary: The liver is unremarkable.  There is intrahepatic biliary ductal dilatation similar to prior CT. The common bile duct measures approximately 14 mm in diameter. The gallbladder is mildly distended. Several stones noted in the gallbladder. There is diffuse thickened and edematous appearance of the gallbladder wall. Two foci of apparent discontinuity in the gallbladder wall with however indication to the adjacent fluid collection noted. Findings concerning for perforated acute cholecystitis. Pancreas: The pancreas is unremarkable as visualized. Spleen: Normal in size without focal abnormality. Adrenals/Urinary Tract: The adrenal glands are unremarkable. Left renal upper pole atrophy and scarring. Small right renal cyst. There is no hydronephrosis on either side. There is symmetric enhancement and excretion of contrast by both kidneys. The urinary bladder is grossly unremarkable. Stomach/Bowel: Postsurgical changes of esophagectomy and gastric pull-through. Multiple duodenal diverticula noted. There is severe diverticulosis of the sigmoid colon. Diffuse thickened appearance of small bowel loops as well as proximal colon, likely reactive to intra-abdominal abscesses. Contrast traverses into the colon  without evidence of small-bowel obstruction. The appendix is unremarkable as visualized. Vascular/Lymphatic: Moderate aortoiliac atherosclerotic disease. 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 Follow-up as per recommendation of prior CT. The IVC is unremarkable. No portal venous gas. Top-normal retroperitoneal lymph nodes measure up to 9 mm short axis. Reproductive: Hysterectomy.  No suspicious adnexal masses. Other: There is diffuse mesenteric edema and small ascites. Multiple loculated fluid collections again noted some of which demonstrate enhancing walls. A collection adjacent to the gallbladder measures approximately 4 x 11 cm appears slightly more organized. A 4.6 x 3.6 cm fluid in the posterior pelvis also appears slightly more organized  compared to prior CT. Musculoskeletal: Osteopenia with degenerative changes of the spine. No acute osseous pathology. IMPRESSION: 1. Multiple fluid collections as seen previously with interval development of some organization in keeping with developing abscesses. Findings likely related to a perforated acute cholecystitis. 2. Postsurgical changes of esophagectomy and gastric pull-through. 3. Severe sigmoid diverticulosis.  No bowel obstruction. 4. Small left pleural effusion and left lung base partial atelectasis or infiltrate. 5.  Aortic Atherosclerosis (ICD10-I70.0). Electronically Signed   By: Vanetta Chou M.D.   On: 01/27/2023 09:32   CT ABDOMEN PELVIS W CONTRAST Result Date: 01/26/2023 CLINICAL DATA:  68 year old female with abdominal pain EXAM: CT ABDOMEN AND PELVIS WITH CONTRAST TECHNIQUE: Multidetector CT imaging of the abdomen and pelvis was performed using the standard protocol following bolus administration of intravenous contrast. RADIATION DOSE REDUCTION: This exam was performed according to the departmental dose-optimization program which includes automated exposure control, adjustment of the mA and/or kV according to patient size and/or use of iterative reconstruction technique. CONTRAST:  80mL OMNIPAQUE  IOHEXOL  300 MG/ML  SOLN COMPARISON:  CT 01/27/2008 FINDINGS: Lower chest: Scarring at the left medial lung base Hepatobiliary: Hepatic parenchyma relatively unremarkable. There is intrahepatic ductal dilatation of both left and right bile ducts. The common hepatic duct and common bile duct are dilated. No radiopaque stones. There is abrupt transition at the ampulla. Additionally the pancreatic duct is dilated. Extensive in circumferential gallbladder wall thickening and enhancement. The gallbladder is relatively elongated and distended with some high density/enhancing stones versus polyps. There is evidence of gallbladder wall rupture towards the right side with adjacent low-density fluid and  peritoneal enhancement. This fluid collection to the right of the gallbladder is estimated on coronal images 7.1 cm by 3.0 cm. A second fluid and gas collection interposed between the gallbladder and the duodenum is estimated 4.6 cm x 2.3 cm. Pancreas: Pancreatic tissue relatively uniform, though not well evaluated at the head of the pancreas secondary to extensive adjacent inflammatory changes and multi spatial abscess. Pancreatic duct is enlarged. Spleen: Unremarkable Adrenals/Urinary Tract: - Right adrenal gland:  Unremarkable - Left adrenal gland: Unremarkable. - Right kidney: No hydronephrosis, nephrolithiasis, inflammation, or ureteral dilation. No focal lesion. - Left Kidney: No hydronephrosis, nephrolithiasis, inflammation, or ureteral dilation. 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 - Urinary Bladder: Unremarkable. Stomach/Bowel: - Stomach: Surgical changes of esophagectomy and pull-through - Small bowel: Duodenum is adjacent to multifocal abscess. There is questionable defect in the left wall of the duodenum (image 36 of series 2, axial images). This is continuous with the fluid and gas collection adjacent to small bowel. - Appendix: Normal. - Colon: Secondary inflammatory changes in the wall of the right colon. The right colon, transverse colon, left colon are fluid-filled. Left-sided diverticular disease, particularly in the sigmoid colon. No evidence of obstruction. Vascular/Lymphatic: Significant irregular soft plaque of the  distal thoracic and upper abdominal aorta. No dissection. Ectasias of the infrarenal abdominal aorta, with transverse diameter measuring 22 mm at the site of saccular outpouching. Mixed calcified and soft plaque throughout the abdominal aorta, with no pedunculated plaque. Bilateral iliac arteries and proximal femoral arteries patent. Mesenteric arteries are patent.  Bilateral renal arteries patent. Unremarkable venous system. Multiple lymph nodes  in the upper abdomen and the retroperitoneal region. Index node in the gastrohepatic ligament measures 7 mm. Subhepatic lymph node on image 31 measures 7 mm. Multiple borderline enlarged lymph nodes in the preaortic/periaortic nodal stations, as well as in the portal caval nodal stations. Reproductive: Hysterectomy Other: Low-density interloop fluid of the small bowel loops as well as ascitic fluid in the pelvis. Associated peritoneal enhancement in the pelvis. Musculoskeletal: No acute displaced fracture. Degenerative changes of the visualized thoracolumbar spine. Degenerative disc disease at L2-L3 and L5-S1 with no bony canal narrowing. IMPRESSION: Multifocal fluid and gas collection of the upper abdomen surrounding the duodenum, potentially representing hollow viscus perforation and/or abscess. There is abnormal appearance of the wall of proximal duodenum on the CT, potentially site of ulceration/perforation. Additionally there is acute cholecystitis with suspected gallbladder wall perforation, contributing 2 complex biloma adjacent to the fundus of the gallbladder, suspected bilious ascites, and CT evidence of bile peritonitis of the abdomen/pelvis. Emergent referral for surgical evaluation is indicated given the above. Although there are high density/enhancing foci in the gallbladder lumen, potentially polyps or gallstones, there is no high density foci within the common bile duct to account for both intrahepatic and extrahepatic biliary ductal dilatation. There is, however, tapering at the distal common bile duct and an ampullary stricture or tumor cannot be excluded. This site may be best evaluated with ERCP, less helpful would be MRCP at this time. Borderline enlarged lymph nodes of the gastrohepatic ligament, subhepatic region, portacaval nodal stations, and retroperitoneum. While these may be reactive given the significant inflammation, a malignant process cannot be excluded. Surgical changes of  esophagectomy and pull-through. These above results discussed by telephone at the time of interpretation on 01/26/2023 at 3:23 pm with APP provider, Darryle, caring for the patient. The right colon wall is edematous and inflamed, presumably reactive given the adjacent presumed biliary ascites. Fluid-filled colon may represent nonspecific enteritis/colitis. Aortic atherosclerosis. 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 Ambulatory referral to vascular team to initiate surveillance would be recommended given the configuration. Additional ancillary findings as above. Signed, Ami RAMAN. Alona ROSALEA GRAVER, RPVI [facility] Electronically Signed   By: Ami Alona D.O.   On: 01/26/2023 15:24      Vernal Alstrom, MD  [facility] 01/27/2023  If 7PM-7AM, please contact night-coverage

## 2023-01-28 ENCOUNTER — Encounter (HOSPITAL_COMMUNITY): Payer: Self-pay | Admitting: General Surgery

## 2023-01-28 ENCOUNTER — Other Ambulatory Visit: Payer: Self-pay

## 2023-01-28 DIAGNOSIS — K631 Perforation of intestine (nontraumatic): Secondary | ICD-10-CM | POA: Diagnosis not present

## 2023-01-28 LAB — CBC
HCT: 40.6 % (ref 36.0–46.0)
Hemoglobin: 13.7 g/dL (ref 12.0–15.0)
MCH: 30.4 pg (ref 26.0–34.0)
MCHC: 33.7 g/dL (ref 30.0–36.0)
MCV: 90 fL (ref 80.0–100.0)
Platelets: 329 10*3/uL (ref 150–400)
RBC: 4.51 MIL/uL (ref 3.87–5.11)
RDW: 13.6 % (ref 11.5–15.5)
WBC: 17.5 10*3/uL — ABNORMAL HIGH (ref 4.0–10.5)
nRBC: 0 % (ref 0.0–0.2)

## 2023-01-28 LAB — COMPREHENSIVE METABOLIC PANEL
ALT: 43 U/L (ref 0–44)
AST: 22 U/L (ref 15–41)
Albumin: 2.1 g/dL — ABNORMAL LOW (ref 3.5–5.0)
Alkaline Phosphatase: 82 U/L (ref 38–126)
Anion gap: 12 (ref 5–15)
BUN: 14 mg/dL (ref 8–23)
CO2: 21 mmol/L — ABNORMAL LOW (ref 22–32)
Calcium: 8 mg/dL — ABNORMAL LOW (ref 8.9–10.3)
Chloride: 99 mmol/L (ref 98–111)
Creatinine, Ser: 0.66 mg/dL (ref 0.44–1.00)
GFR, Estimated: >60 mL/min (ref >60)
Glucose, Bld: 89 mg/dL (ref 70–99)
Potassium: 4.1 mmol/L (ref 3.5–5.1)
Sodium: 132 mmol/L — ABNORMAL LOW (ref 135–145)
Total Bilirubin: 0.6 mg/dL (ref 0.0–1.2)
Total Protein: 5.4 g/dL — ABNORMAL LOW (ref 6.5–8.1)

## 2023-01-28 LAB — SURGICAL PATHOLOGY

## 2023-01-28 LAB — PREALBUMIN: Prealbumin: <5 mg/dL — ABNORMAL LOW (ref 18–38)

## 2023-01-28 LAB — MAGNESIUM: Magnesium: 2.4 mg/dL (ref 1.7–2.4)

## 2023-01-28 MED ORDER — TRAVASOL 10 % IV SOLN: INTRAVENOUS | Status: DC

## 2023-01-28 MED ORDER — ENOXAPARIN SODIUM 40 MG/0.4ML IJ SOSY
40.0000 mg | PREFILLED_SYRINGE | INTRAMUSCULAR | Status: DC
  Administered 2023-01-28 – 2023-02-05 (×9): 40 mg via SUBCUTANEOUS
  Filled 2023-01-28 (×9): qty 0.4

## 2023-01-28 MED ORDER — SODIUM CHLORIDE 0.9 % IV SOLN: INTRAVENOUS | Status: AC

## 2023-01-28 MED ORDER — INSULIN ASPART 100 UNIT/ML IJ SOLN
0.0000 [IU] | Freq: Three times a day (TID) | INTRAMUSCULAR | Status: DC
  Administered 2023-01-29: 2 [IU] via SUBCUTANEOUS
  Administered 2023-01-29: 1 [IU] via SUBCUTANEOUS
  Administered 2023-01-29 – 2023-01-31 (×6): 2 [IU] via SUBCUTANEOUS
  Administered 2023-02-01 (×3): 1 [IU] via SUBCUTANEOUS

## 2023-01-28 MED ORDER — SODIUM CHLORIDE 0.9% FLUSH
10.0000 mL | INTRAVENOUS | Status: DC | PRN
  Administered 2023-02-03: 10 mL

## 2023-01-28 MED ORDER — SODIUM CHLORIDE 0.9 % IV SOLN
INTRAVENOUS | Status: AC
Start: 2023-01-28 — End: 2023-01-29

## 2023-01-28 MED ORDER — TRAVASOL 10 % IV SOLN
INTRAVENOUS | Status: AC
  Filled 2023-01-28: qty 436.8

## 2023-01-28 MED ORDER — BOOST / RESOURCE BREEZE PO LIQD CUSTOM
1.0000 | ORAL | Status: DC
  Administered 2023-01-29 – 2023-02-03 (×6): 1 via ORAL

## 2023-01-28 MED ORDER — SODIUM CHLORIDE 0.9% FLUSH
10.0000 mL | Freq: Two times a day (BID) | INTRAVENOUS | Status: DC
  Administered 2023-01-28: 10 mL
  Administered 2023-01-29: 30 mL
  Administered 2023-01-29 – 2023-02-04 (×5): 10 mL

## 2023-01-28 MED ORDER — THIAMINE HCL 100 MG/ML IJ SOLN
100.0000 mg | Freq: Every day | INTRAMUSCULAR | Status: AC
  Administered 2023-01-28 – 2023-01-31 (×4): 100 mg via INTRAVENOUS
  Filled 2023-01-28 (×4): qty 2

## 2023-01-28 NOTE — Progress Notes (Signed)
 PROGRESS NOTE    Lauren [name]  DGU:[id number] DOB: 1955/08/05 DOA: 01/26/2023 PCP: Neda Balk, MD    Brief Narrative:  68 yo female with past medical history of prediabetes,hyperlipidemia presented to hospital with abdominal pain nausea vomiting diarrhea, for 1 week initially started with nausea and vomiting. In the ED, vitals stable except for tachycardia. Labs with mild hyponatremia hypomagnesemia with magnesium  level of 1.4. Elevated LFTs. HIV was nonreactive. Urinalysis was negative. CT abdomen done in the ED showed bowel perforation and walled off abscess as well as cholecystitis and gallbladder perforation. Lipase was 10. Narrowing of CBD-?malignancy unclear. General surgery was consulted and patient was admitted hospital for further evaluation and treatment  Underwent diagnostic laparoscopy with open cholecystectomy with drainage of intra-abdominal abscess by general surgery on 1/14.   Assessment & Plan:  Principal Problem:   Perforated bowel (HCC) Active Problems:   Perforated gallbladder     Acute cholecystitis with gallbladder perforation with possible walled off abscess Status post diagnostic laparoscopy with open cholecystectomy with drainage of abscess 1/14 Antibiotics.  Postop management by general surgery   Hyponatremia.   Stable continue IV fluids   Hypomagnesemia  As needed repletion   Elevated LFTs. Stable  Hyperlipidemia/prediabetes - Diet controlled  Depression - Venlafaxine   Gen Sx is primary team now, Appreciate their help   DVT prophylaxis: SCDs Start: 01/26/23 1906     Disposition: Home likely in 1 to 2 days when okay with surgery. Code Status: Full code  Family Communication: None at bedside Disposition-postop management by general surgery  Subjective: Doing ok Not passing gas yet.    Examination:  General exam: Appears calm and comfortable  Respiratory system: Clear to auscultation. Respiratory effort normal. Cardiovascular  system: S1 & S2 heard, RRR. No JVD, murmurs, rubs, gallops or clicks. No pedal edema. Gastrointestinal system: Abdomen is nondistended, soft and nontender. No organomegaly or masses felt.  No bowel sounds heard yet.  Central nervous system: Alert and oriented. No focal neurological deficits. Extremities: Symmetric 5 x 5 power. Skin: No rashes, lesions or ulcers Psychiatry: Judgement and insight appear normal. Mood & affect appropriate. RLE drain in place.                        Diet Orders (From admission, onward)     Start     Ordered   01/28/23 0843  Diet clear liquid Room service appropriate? Yes; Fluid consistency: Thin  Diet effective now       Question Answer Comment  Room service appropriate? Yes   Fluid consistency: Thin      01/28/23 0843            Objective: Vitals:   01/28/23 0806 01/28/23 0900 01/28/23 1000 01/28/23 1100  BP:  117/64  126/77  Pulse: (!) 107 (!) 105 (!) 110 (!) 117  Resp: 18 17 14 16   Temp: (!) 97.5 F (36.4 C)     TempSrc: Oral     SpO2: 97% 94% 96% 94%  Weight:      Height:        Intake/Output Summary (Last 24 hours) at 01/28/2023 1123 Last data filed at 01/28/2023 0827 Gross per 24 hour  Intake 2046.64 ml  Output 1380 ml  Net 666.64 ml   Filed Weights   01/27/23 1143  Weight: 55.3 kg    Scheduled Meds:  Chlorhexidine  Gluconate Cloth  6 each Topical Daily   enoxaparin  (LOVENOX ) injection  40 mg Subcutaneous Q24H   [  START ON 01/29/2023] feeding supplement  1 Container Oral Q24H   HYDROmorphone    Intravenous Q4H   [START ON 01/29/2023] insulin  aspart  0-9 Units Subcutaneous Q8H   methocarbamol  (ROBAXIN ) injection  1,000 mg Intravenous Q8H   thiamine  (VITAMIN B1) injection  100 mg Intravenous Daily   venlafaxine  XR  225 mg Oral Q breakfast   Continuous Infusions:  sodium chloride  100 mL/hr at 01/28/23 1121   Followed by   sodium chloride      acetaminophen  Stopped (01/28/23 0600)   piperacillin -tazobactam  (ZOSYN )  IV Stopped (01/28/23 1610)   TPN ADULT (ION)      Nutritional status Signs/Symptoms: energy intake < or equal to 50% for > or equal to 5 days, moderate muscle depletion Interventions: Boost Breeze, TPN Body mass index is 20.94 kg/m.  Data Reviewed:   CBC: Recent Labs  Lab 01/26/23 1214 01/27/23 0308 01/28/23 0309  WBC 7.7 15.3* 17.5*  NEUTROABS 6.6  --   --   HGB 14.6 12.8 13.7  HCT 43.4 38.4 40.6  MCV 88.9 91.6 90.0  PLT 356 343 329   Basic Metabolic Panel: Recent Labs  Lab 01/26/23 1214 01/27/23 0308 01/28/23 0309  NA 133* 129* 132*  K 4.2 3.6 4.1  CL 95* 95* 99  CO2 26 24 21*  GLUCOSE 183* 120* 89  BUN 13 13 14   CREATININE 0.80 0.67 0.66  CALCIUM  9.2 7.9* 8.0*  MG 1.4*  --  2.4   GFR: Estimated Creatinine Clearance: 57.3 mL/min (by C-G formula based on SCr of 0.66 mg/dL). Liver Function Tests: Recent Labs  Lab 01/26/23 1214 01/27/23 0308 01/28/23 0309  AST 64* 32 22  ALT 101* 73* 43  ALKPHOS 132* 96 82  BILITOT 0.9 0.8 0.6  PROT 6.7 5.9* 5.4*  ALBUMIN 3.5 2.5* 2.1*   Recent Labs  Lab 01/26/23 1214  LIPASE 10*   No results for input(s): "AMMONIA" in the last 168 hours. Coagulation Profile: No results for input(s): "INR", "PROTIME" in the last 168 hours. Cardiac Enzymes: No results for input(s): "CKTOTAL", "CKMB", "CKMBINDEX", "TROPONINI" in the last 168 hours. BNP (last 3 results) No results for input(s): "PROBNP" in the last 8760 hours. HbA1C: No results for input(s): "HGBA1C" in the last 72 hours. CBG: No results for input(s): "GLUCAP" in the last 168 hours. Lipid Profile: No results for input(s): "CHOL", "HDL", "LDLCALC", "TRIG", "CHOLHDL", "LDLDIRECT" in the last 72 hours. Thyroid  Function Tests: No results for input(s): "TSH", "T4TOTAL", "FREET4", "T3FREE", "THYROIDAB" in the last 72 hours. Anemia Panel: No results for input(s): "VITAMINB12", "FOLATE", "FERRITIN", "TIBC", "IRON", "RETICCTPCT" in the last 72 hours. Sepsis  Labs: No results for input(s): "PROCALCITON", "LATICACIDVEN" in the last 168 hours.  Recent Results (from the past 240 hours)  MRSA Next Gen by PCR, Nasal     Status: None   Collection Time: 01/27/23  3:10 AM   Specimen: Nasal Mucosa; Nasal Swab  Result Value Ref Range Status   MRSA by PCR Next Gen NOT DETECTED NOT DETECTED Final    Comment: (NOTE) The GeneXpert MRSA Assay (FDA approved for NASAL specimens only), is one component of a comprehensive MRSA colonization surveillance program. It is not intended to diagnose MRSA infection nor to guide or monitor treatment for MRSA infections. Test performance is not FDA approved in patients less than 43 years old. Performed at [facility], [address], [place], [place] [address]          Radiology Studies: US  EKG SITE RITE Result  Date: 01/28/2023 If Site Rite image not attached, placement could not be confirmed due to current cardiac rhythm.  DG Abd Portable 1V Result Date: 01/27/2023 CLINICAL DATA:  Foreign body and gastrointestinal tract. Wrong frontal count. One extra long metal item found during final count. EXAM: PORTABLE ABDOMEN - 1 VIEW COMPARISON:  CT abdomen and pelvis 01/27/2023 FINDINGS: Mild oral contrast is seen within the ascending, proximal transverse, and descending colon. There is again ascending and proximal transverse colon wall thickening. Lucent air within numerous diverticula in the sigmoid colon. A surgical drain is seen overlying the right hemipelvis with the tip overlying the liver. Surgical clips overlie the mid upper abdomen. Mild contrast excretion into the right renal pelvis and the bilateral ureters. A small amount of contrast within the urinary bladder around Foley catheter balloon. No long metal foreign body is identified. Severe right femoroacetabular osteoarthritis. IMPRESSION: 1. No metal foreign body is identified. Note is made that no instrument was actually missing, however rather an  extra instrument was counted outside the patient's body at the end of the surgical case. 2. Ascending and proximal transverse colon wall thickening consistent with inflammation secondary to the gallbladder wall perforation and abscesses seen on CT earlier today. 3. Sigmoid colon diverticulosis. Critical Value/emergent results were called by telephone at the time of interpretation on 01/27/2023 at 2:33 pm to provider Maryrose Soja, RN, OR circulator nurse, in the OR, who verbally acknowledged these results. Electronically Signed   By: Bertina Broccoli M.D.   On: 01/27/2023 14:34   CT ABDOMEN PELVIS W CONTRAST Result Date: 01/27/2023 CLINICAL DATA:  Intra-abdominal infection/peritonitis bowel perforation. EXAM: CT ABDOMEN AND PELVIS WITH CONTRAST TECHNIQUE: Multidetector CT imaging of the abdomen and pelvis was performed using the standard protocol following bolus administration of intravenous contrast. RADIATION DOSE REDUCTION: This exam was performed according to the departmental dose-optimization program which includes automated exposure control, adjustment of the mA and/or kV according to patient size and/or use of iterative reconstruction technique. CONTRAST:  OMNIPAQUE  IOHEXOL  300 MG/ML  SOLN COMPARISON:  CT abdomen pelvis dated 01/26/2023. FINDINGS: Lower chest: Small left pleural effusion and left lung base partial atelectasis or infiltrate. The visualized right lung is clear. Hepatobiliary: The liver is unremarkable. There is intrahepatic biliary ductal dilatation similar to prior CT. The common bile duct measures approximately 14 mm in diameter. The gallbladder is mildly distended. Several stones noted in the gallbladder. There is diffuse thickened and edematous appearance of the gallbladder wall. Two foci of apparent discontinuity in the gallbladder wall with however indication to the adjacent fluid collection noted. Findings concerning for perforated acute cholecystitis. Pancreas: The pancreas is  unremarkable as visualized. Spleen: Normal in size without focal abnormality. Adrenals/Urinary Tract: The adrenal glands are unremarkable. Left renal upper pole atrophy and scarring. Small right renal cyst. There is no hydronephrosis on either side. There is symmetric enhancement and excretion of contrast by both kidneys. The urinary bladder is grossly unremarkable. Stomach/Bowel: Postsurgical changes of esophagectomy and gastric pull-through. Multiple duodenal diverticula noted. There is severe diverticulosis of the sigmoid colon. Diffuse thickened appearance of small bowel loops as well as proximal colon, likely reactive to intra-abdominal abscesses. Contrast traverses into the colon without evidence of small-bowel obstruction. The appendix is unremarkable as visualized. Vascular/Lymphatic: Moderate aortoiliac atherosclerotic disease. 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 Follow-up as per recommendation of prior CT. The IVC is unremarkable. No portal venous gas. Top-normal retroperitoneal lymph nodes measure up to 9 mm short axis. Reproductive: Hysterectomy.  No suspicious adnexal masses. Other: There is diffuse mesenteric edema and small ascites. Multiple loculated fluid collections again noted some of which demonstrate enhancing walls. A collection adjacent to the gallbladder measures approximately 4 x 11 cm appears slightly more organized. A 4.6 x 3.6 cm fluid in the posterior pelvis also appears slightly more organized compared to prior CT. Musculoskeletal: Osteopenia with degenerative changes of the spine. No acute osseous pathology. IMPRESSION: 1. Multiple fluid collections as seen previously with interval development of some organization in keeping with developing abscesses. Findings likely related to a perforated acute cholecystitis. 2. Postsurgical changes of esophagectomy and gastric pull-through. 3. Severe sigmoid diverticulosis.  No bowel obstruction. 4. Small left pleural effusion and left lung  base partial atelectasis or infiltrate. 5.  Aortic Atherosclerosis (ICD10-I70.0). Electronically Signed   By: Angus Bark M.D.   On: 01/27/2023 09:32   CT ABDOMEN PELVIS W CONTRAST Result Date: 01/26/2023 CLINICAL DATA:  68 year old female with abdominal pain EXAM: CT ABDOMEN AND PELVIS WITH CONTRAST TECHNIQUE: Multidetector CT imaging of the abdomen and pelvis was performed using the standard protocol following bolus administration of intravenous contrast. RADIATION DOSE REDUCTION: This exam was performed according to the departmental dose-optimization program which includes automated exposure control, adjustment of the mA and/or kV according to patient size and/or use of iterative reconstruction technique. CONTRAST:  80mL OMNIPAQUE  IOHEXOL  300 MG/ML  SOLN COMPARISON:  CT 01/27/2008 FINDINGS: Lower chest: Scarring at the left medial lung base Hepatobiliary: Hepatic parenchyma relatively unremarkable. There is intrahepatic ductal dilatation of both left and right bile ducts. The common hepatic duct and common bile duct are dilated. No radiopaque stones. There is abrupt transition at the ampulla. Additionally the pancreatic duct is dilated. Extensive in circumferential gallbladder wall thickening and enhancement. The gallbladder is relatively elongated and distended with some high density/enhancing stones versus polyps. There is evidence of gallbladder wall rupture towards the right side with adjacent low-density fluid and peritoneal enhancement. This fluid collection to the right of the gallbladder is estimated on coronal images 7.1 cm by 3.0 cm. A second fluid and gas collection interposed between the gallbladder and the duodenum is estimated 4.6 cm x 2.3 cm. Pancreas: Pancreatic tissue relatively uniform, though not well evaluated at the head of the pancreas secondary to extensive adjacent inflammatory changes and multi spatial abscess. Pancreatic duct is enlarged. Spleen: Unremarkable Adrenals/Urinary  Tract: - Right adrenal gland:  Unremarkable - Left adrenal gland: Unremarkable. - Right kidney: No hydronephrosis, nephrolithiasis, inflammation, or ureteral dilation. No focal lesion. - Left Kidney: No hydronephrosis, nephrolithiasis, inflammation, or ureteral dilation. 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 - Urinary Bladder: Unremarkable. Stomach/Bowel: - Stomach: Surgical changes of esophagectomy and pull-through - Small bowel: Duodenum is adjacent to multifocal abscess. There is questionable defect in the left wall of the duodenum (image 36 of series 2, axial images). This is continuous with the fluid and gas collection adjacent to small bowel. - Appendix: Normal. - Colon: Secondary inflammatory changes in the wall of the right colon. The right colon, transverse colon, left colon are fluid-filled. Left-sided diverticular disease, particularly in the sigmoid colon. No evidence of obstruction. Vascular/Lymphatic: Significant irregular soft plaque of the distal thoracic and upper abdominal aorta. No dissection. Ectasias of the infrarenal abdominal aorta, with transverse diameter measuring 22 mm at the site of saccular outpouching. Mixed calcified and soft plaque throughout the abdominal aorta, with no pedunculated plaque. Bilateral iliac arteries and proximal femoral arteries patent. Mesenteric arteries are patent.  Bilateral  renal arteries patent. Unremarkable venous system. Multiple lymph nodes in the upper abdomen and the retroperitoneal region. Index node in the gastrohepatic ligament measures 7 mm. Subhepatic lymph node on image 31 measures 7 mm. Multiple borderline enlarged lymph nodes in the preaortic/periaortic nodal stations, as well as in the portal caval nodal stations. Reproductive: Hysterectomy Other: Low-density interloop fluid of the small bowel loops as well as ascitic fluid in the pelvis. Associated peritoneal enhancement in the pelvis. Musculoskeletal: No acute  displaced fracture. Degenerative changes of the visualized thoracolumbar spine. Degenerative disc disease at L2-L3 and L5-S1 with no bony canal narrowing. IMPRESSION: Multifocal fluid and gas collection of the upper abdomen surrounding the duodenum, potentially representing hollow viscus perforation and/or abscess. There is abnormal appearance of the wall of proximal duodenum on the CT, potentially site of ulceration/perforation. Additionally there is acute cholecystitis with suspected gallbladder wall perforation, contributing 2 complex biloma adjacent to the fundus of the gallbladder, suspected bilious ascites, and CT evidence of bile peritonitis of the abdomen/pelvis. Emergent referral for surgical evaluation is indicated given the above. Although there are high density/enhancing foci in the gallbladder lumen, potentially polyps or gallstones, there is no high density foci within the common bile duct to account for both intrahepatic and extrahepatic biliary ductal dilatation. There is, however, tapering at the distal common bile duct and an ampullary stricture or tumor cannot be excluded. This site may be best evaluated with ERCP, less helpful would be MRCP at this time. Borderline enlarged lymph nodes of the gastrohepatic ligament, subhepatic region, portacaval nodal stations, and retroperitoneum. While these may be reactive given the significant inflammation, a malignant process cannot be excluded. Surgical changes of esophagectomy and pull-through. These above results discussed by telephone at the time of interpretation on 01/26/2023 at 3:23 pm with APP provider, Fairy Homer, caring for the patient. The right colon wall is edematous and inflamed, presumably reactive given the adjacent presumed biliary ascites. Fluid-filled colon may represent nonspecific enteritis/colitis. Aortic atherosclerosis. 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  Ambulatory referral to vascular team to initiate surveillance would be recommended given the configuration. Additional ancillary findings as above. Signed, Marciano Settles. Rexine Cater, RPVI [facility] Electronically Signed   By: Myrlene Asper D.O.   On: 01/26/2023 15:24           LOS: 2 days   Time spent= 35 mins    Maggie Schooner, MD [facility]  If 7PM-7AM, please contact night-coverage  01/28/2023, 11:23 AM

## 2023-01-28 NOTE — Progress Notes (Signed)
 Peripherally Inserted Central Catheter Placement  The IV Nurse has discussed with the patient and/or persons authorized to consent for the patient, the purpose of this procedure and the potential benefits and risks involved with this procedure.  The benefits include less needle sticks, lab draws from the catheter, and the patient may be discharged home with the catheter. Risks include, but not limited to, infection, bleeding, blood clot (thrombus formation), and puncture of an artery; nerve damage and irregular heartbeat and possibility to perform a PICC exchange if needed/ordered by physician.  Alternatives to this procedure were also discussed.  Bard Power PICC patient education guide, fact sheet on infection prevention and patient information card has been provided to patient /or left at bedside.    PICC Placement Documentation  PICC Double Lumen 01/28/23 Right Brachial 39 cm 0 cm (Active)  Indication for Insertion or Continuance of Line Administration of hyperosmolar/irritating solutions (i.e. TPN, Vancomycin, etc.) 01/28/23 1609  Exposed Catheter (cm) 0 cm 01/28/23 1609  Site Assessment Clean, Dry, Intact 01/28/23 1609  Lumen #1 Status Flushed;Saline locked;Blood return noted 01/28/23 1609  Lumen #2 Status Flushed;Saline locked;Blood return noted 01/28/23 1609  Dressing Type Transparent;Securing device 01/28/23 1609  Dressing Status Antimicrobial disc/dressing in place;Clean, Dry, Intact 01/28/23 1609  Line Care Connections checked and tightened 01/28/23 1609  Line Adjustment (NICU/IV Team Only) No 01/28/23 1609  Dressing Intervention New dressing;Adhesive placed at insertion site (IV team only);Other (Comment) 01/28/23 1609  Dressing Change Due 02/04/23 01/28/23 1609       Nadean August 01/28/2023, 4:11 PM

## 2023-01-28 NOTE — Progress Notes (Signed)
 PHARMACY - TOTAL PARENTERAL NUTRITION CONSULT NOTE   Indication: Prolonged ileus  Patient Measurements: Height: 5\' 4"  (162.6 cm) Weight: 55.3 kg (122 lb) IBW/kg (Calculated) : 54.7 TPN AdjBW (KG): 55.3 Body mass index is 20.94 kg/m.  Assessment: 68 year old female s/p open chole for perforated gallbladder and drainage of intra-abdominal abscess. Patient with poor oral intake prior to admission, approximately 10 days with very little intake. Expect prolonged ileus, patient also high risk for post op intra-abdominal abscess as well. Pharmacy to manage TPN.  Glucose / Insulin : no hx of DM Electrolytes: Na 129 > 132, all others WNL including corrCa Renal: WNL Hepatic: LFTs WNL, prealbumin low Intake / Output; MIVF:  -NS at 100 ml/hr -800 cc UOP documented this am GI Imaging: 1/14 CT abd/pelv - multiple fluid collections w/ development of some organization in keeping with developing abscesses; findings likely related to a perforated acute cholecystitis GI Surgeries / Procedures:  1/14 open chole for perforated gallbladder and drainage of intra-abdominal abscess  Central access: PICC pending TPN start date: 1/15 pending line placement  Nutritional Goals: Goal TPN rate is 70 mL/hr (provides 87 g of protein and 1710 kcals per day)  RD Assessment: Estimated Needs Total Energy Estimated Needs: 1650-1850 Total Protein Estimated Needs: 80-90 g Total Fluid Estimated Needs: 1.65-1.85 L  Current Nutrition: CLD  Plan:  -Start TPN at 35 mL/hr at 1800 -Electrolytes in TPN: Na 50 mEq/L, K 50 mEq/L, Ca 5 mEq/L, Mg 5 mEq/L, and Phos 15 mmol/L. Cl:Ac 1:1 -Add standard MVI and trace elements to TPN -Thiamine  100 mg IV daily x 5 days for refeeding risk -Initiate Sensitive q8h SSI and adjust as needed. May be able to discontinue once at goal if CBGs remain well controlled -Reduce MIVF to 60 mL/hr at 1800 -Monitor TPN labs on Mon/Thurs, daily during start given high refeeding risk   Lolita Rise, PharmD, BCPS Clinical Pharmacist 01/28/2023 10:37 AM

## 2023-01-28 NOTE — Progress Notes (Signed)
 Initial Nutrition Assessment  DOCUMENTATION CODES:   Severe malnutrition in context of acute illness/injury  INTERVENTION:  -Plan to start TPN today   -TPN management per pharmacy -Monitor magnesium , potassium, and phosphorus BID for at least 3 days, MD to replete as needed, as pt is at risk for refeeding syndrome given inadequate oral intake for 10 days and severe acute malnutrition. Add Thiamine  100 mg daily for 5 days -Clear liquid diet per MD -Will monitor further diet advancement -Boost Breeze daily to support oral intake. If pt tolerating oral diet, consider increasing to TID  NUTRITION DIAGNOSIS:   Severe Malnutrition related to acute illness (perforated gallbladder) (perforated bowel) as evidenced by energy intake < or equal to 50% for > or equal to 5 days, moderate muscle depletion.  GOAL:   Patient will meet greater than or equal to 90% of their needs  MONITOR:   PO intake, Supplement acceptance, Diet advancement, Labs, Weight trends  REASON FOR ASSESSMENT:   Consult New TPN/TNA (verbal TPN consult by pharmicist)  ASSESSMENT:   Pt with PMH of  esophageal cancer with esophagectomy (15 years ago), hyperlipidemia, prediabetes, and depression. Pt admitted for perforated bowel/gallbladder after experiencing severe abdominal pain, vomiting, and diarrhea for the past few days. Pt had cholecystectomy yesterday 01/27/23.   Pt on clear liquid diet and will be starting TPN today due to her recent cholecystectomy.  Pt lives at home with husband. Pt reports cooking frequently at home. Pt tends to have 2 main meals a day. Pt reports a usual breakfast of eggs, bacon, coffee and juice. She reports usually skipping lunch due to a larger dinner. For dinner she reports cooking a wide variety of things such as chicken, pork, seafood, pasta, and vegetables. Pt reports drinking Ensures back in 2010 when she was being treated for cancer and not minding the taste of them. She is open to trying  Boost Breeze while on clear liquid diet. She reports no appetite recently because of the flu (vomiting and diarrhea) and her abdominal pain. She says she has not eaten in about 10 days. She reports following the "BRAT diet" on her sick days where she will have bananas, applesauce, and ginger ale. She reports no recent weight loss and having a usual body weight of 122 lbs.   Meds taken at home: Vitamin C 1000 mg, Vitamin D  50 mcg  Labs: A1C 6.2   NUTRITION - FOCUSED PHYSICAL EXAM:  Flowsheet Row Most Recent Value  Orbital Region Mild depletion  Upper Arm Region No depletion  Thoracic and Lumbar Region No depletion  Buccal Region No depletion  Temple Region Mild depletion  Clavicle Bone Region Moderate depletion  Clavicle and Acromion Bone Region Moderate depletion  Scapular Bone Region Mild depletion  Dorsal Hand No depletion  Patellar Region Mild depletion  Anterior Thigh Region Mild depletion  Posterior Calf Region No depletion  Edema (RD Assessment) None  Hair Reviewed  Eyes Reviewed  Mouth Reviewed  Skin Reviewed  Nails Unable to assess  [painted nails]       Diet Order:   Diet Order             Diet clear liquid Room service appropriate? Yes; Fluid consistency: Thin  Diet effective now                   EDUCATION NEEDS:   Education needs have been addressed  Skin:  Skin Assessment: Reviewed RN Assessment  Last BM:  1/14  Height:  Ht Readings from Last 1 Encounters:  01/27/23 5\' 4"  (1.626 m)    Weight:   Wt Readings from Last 1 Encounters:  01/27/23 55.3 kg    Ideal Body Weight:   120 lbs  BMI:  Body mass index is 20.94 kg/m.  Estimated Nutritional Needs:   Kcal:  1650-1850  Protein:  80-90 g  Fluid:  1.65-1.85 L    Dessie Flow, MS Dietetic Intern

## 2023-01-28 NOTE — Evaluation (Signed)
 Physical Therapy Evaluation Patient Details Name: Lauren [name] MRN: [id number] DOB: [birth year] Today's Date: 01/28/2023  History of Present Illness  Pt is 68 yo female admitted on 01/26/23 with perforated gallbladder and is s/p open chole on 01/27/23. Pt with hx including but not limited to prediabetic, HLD, open hysterectomy  Clinical Impression  Pt admitted with above diagnosis. At baseline, pt is independent.  Today, pt needing min A for supine/sit but otherwise min guard.  She did utilize RW as she felt a little weak and woozy.  All VSS, pt does have PCA and using for pain control.  Pt expected to progress well and likely will only need a few sessions of PT and no PT needs expected at d/c.  Pt currently with functional limitations due to the deficits listed below (see PT Problem List). Pt will benefit from acute skilled PT to increase their independence and safety with mobility to allow discharge.           If plan is discharge home, recommend the following: Assistance with cooking/housework;Help with stairs or ramp for entrance   Can travel by private vehicle        Equipment Recommendations None recommended by PT  Recommendations for Other Services       Functional Status Assessment Patient has had a recent decline in their functional status and demonstrates the ability to make significant improvements in function in a reasonable and predictable amount of time.     Precautions / Restrictions Precautions Precautions: Fall Precaution Comments: JP drain      Mobility  Bed Mobility Overal bed mobility: Needs Assistance Bed Mobility: Supine to Sit, Sit to Supine     Supine to sit: Min assist Sit to supine: Min assist   General bed mobility comments: MIn A for lifting trunk.  Did educate on log roll technique for pain control    Transfers Overall transfer level: Needs assistance Equipment used: None, Rolling walker (2 wheels) Transfers: Sit to/from Stand Sit to  Stand: Contact guard assist           General transfer comment: STS x 2; CGA for safety    Ambulation/Gait Ambulation/Gait assistance: Contact guard assist Gait Distance (Feet): 150 Feet Assistive device: Rolling walker (2 wheels) Gait Pattern/deviations: Step-through pattern, Decreased stride length, Wide base of support Gait velocity: decreased     General Gait Details: pt reports feels a little woozy (has PCA), all VSS; CGA for safety  Stairs            Wheelchair Mobility     Tilt Bed    Modified Rankin (Stroke Patients Only)       Balance Overall balance assessment: Needs assistance Sitting-balance support: No upper extremity supported Sitting balance-Leahy Scale: Good     Standing balance support: No upper extremity supported, Bilateral upper extremity supported Standing balance-Leahy Scale: Fair Standing balance comment: RW to ambulate but could static stand without AD                             Pertinent Vitals/Pain Pain Assessment Pain Assessment: 0-10 Pain Score: 7  Pain Location: abdomen Pain Descriptors / Indicators: Discomfort Pain Intervention(s): Limited activity within patient's tolerance, Monitored during session    Home Living Family/patient expects to be discharged to:: Private residence Living Arrangements: Spouse/significant other Available Help at Discharge: Family;Available 24 hours/day Type of Home: House Home Access: Level entry       Home Layout: Multi-level;Able  to live on main level with bedroom/bathroom Home Equipment: None      Prior Function Prior Level of Function : Independent/Modified Independent;Driving                     Extremity/Trunk Assessment   Upper Extremity Assessment Upper Extremity Assessment: Overall WFL for tasks assessed (Did not MMT due to abdominal sx but no major deficits noted)    Lower Extremity Assessment Lower Extremity Assessment: Overall WFL for tasks assessed  (Did not MMT due to abdominal sx but no major deficits noted)    Cervical / Trunk Assessment Cervical / Trunk Assessment: Normal;Other exceptions Cervical / Trunk Exceptions: Abdominal sx  Communication      Cognition Arousal: Alert Behavior During Therapy: WFL for tasks assessed/performed Overall Cognitive Status: Within Functional Limits for tasks assessed                                          General Comments General comments (skin integrity, edema, etc.): On RA with all VSS    Exercises     Assessment/Plan    PT Assessment Patient needs continued PT services  PT Problem List Decreased strength;Pain;Decreased activity tolerance;Decreased knowledge of use of DME;Decreased balance;Decreased mobility       PT Treatment Interventions DME instruction;Therapeutic exercise;Gait training;Balance training;Stair training;Functional mobility training;Therapeutic activities;Patient/family education    PT Goals (Current goals can be found in the Care Plan section)  Acute Rehab PT Goals Patient Stated Goal: return home PT Goal Formulation: With patient Time For Goal Achievement: 02/11/23 Potential to Achieve Goals: Good    Frequency Min 1X/week     Co-evaluation               AM-PAC PT "6 Clicks" Mobility  Outcome Measure Help needed turning from your back to your side while in a flat bed without using bedrails?: A Little Help needed moving from lying on your back to sitting on the side of a flat bed without using bedrails?: A Little Help needed moving to and from a bed to a chair (including a wheelchair)?: A Little Help needed standing up from a chair using your arms (e.g., wheelchair or bedside chair)?: A Little Help needed to walk in hospital room?: A Little Help needed climbing 3-5 steps with a railing? : A Little 6 Click Score: 18    End of Session   Activity Tolerance: Patient tolerated treatment well Patient left: in bed;with call  bell/phone within reach Nurse Communication: Mobility status PT Visit Diagnosis: Other abnormalities of gait and mobility (R26.89)    Time: 6578-4696 PT Time Calculation (min) (ACUTE ONLY): 22 min   Charges:   PT Evaluation $PT Eval Moderate Complexity: 1 Mod   PT General Charges $$ ACUTE PT VISIT: 1 Visit         Cyd Dowse, PT Acute Rehab Services [facility] [phone]   Carolynn Citrin 01/28/2023, 2:50 PM

## 2023-01-28 NOTE — Plan of Care (Signed)
   Problem: Education: Goal: Knowledge of General Education information will improve Description Including pain rating scale, medication(s)/side effects and non-pharmacologic comfort measures Outcome: Progressing

## 2023-01-28 NOTE — Progress Notes (Signed)
 1 Day Post-Op   Subjective/Chief Complaint: Feels well, no flatus, no n/v, pain controlled, discussed surgical findings   Objective: Vital signs in last 24 hours: Temp:  [97.7 F (36.5 C)-99.1 F (37.3 C)] 98.1 F (36.7 C) (01/15 0412) Pulse Rate:  [91-115] 95 (01/15 0100) Resp:  [10-25] 16 (01/15 0414) BP: (105-138)/(47-92) 119/63 (01/15 0100) SpO2:  [92 %-100 %] 96 % (01/15 0100) FiO2 (%):  [30 %] 30 % (01/14 1708) Weight:  [55.3 kg] 55.3 kg (01/14 1143) Last BM Date :  (PTA)  Intake/Output from previous day: 01/14 0701 - 01/15 0700 In: 1845 [I.V.:1619.5; IV Piggyback:225.5] Out: 580 [Urine:475; Drains:5; Blood:100] Intake/Output this shift: No intake/output data recorded.  General nad Cv low grade tachycardia Pulm effort normal Ab soft approp tender dressings intact drain serosang  Lab Results:  Recent Labs    01/27/23 0308 01/28/23 0309  WBC 15.3* 17.5*  HGB 12.8 13.7  HCT 38.4 40.6  PLT 343 329   BMET Recent Labs    01/27/23 0308 01/28/23 0309  NA 129* 132*  K 3.6 4.1  CL 95* 99  CO2 24 21*  GLUCOSE 120* 89  BUN 13 14  CREATININE 0.67 0.66  CALCIUM  7.9* 8.0*   PT/INR No results for input(s): "LABPROT", "INR" in the last 72 hours. ABG No results for input(s): "PHART", "HCO3" in the last 72 hours.  Invalid input(s): "PCO2", "PO2"  Studies/Results: US  EKG SITE RITE Result Date: 01/28/2023 If Site Rite image not attached, placement could not be confirmed due to current cardiac rhythm.  DG Abd Portable 1V Result Date: 01/27/2023 CLINICAL DATA:  Foreign body and gastrointestinal tract. Wrong frontal count. One extra long metal item found during final count. EXAM: PORTABLE ABDOMEN - 1 VIEW COMPARISON:  CT abdomen and pelvis 01/27/2023 FINDINGS: Mild oral contrast is seen within the ascending, proximal transverse, and descending colon. There is again ascending and proximal transverse colon wall thickening. Lucent air within numerous diverticula in the  sigmoid colon. A surgical drain is seen overlying the right hemipelvis with the tip overlying the liver. Surgical clips overlie the mid upper abdomen. Mild contrast excretion into the right renal pelvis and the bilateral ureters. A small amount of contrast within the urinary bladder around Foley catheter balloon. No long metal foreign body is identified. Severe right femoroacetabular osteoarthritis. IMPRESSION: 1. No metal foreign body is identified. Note is made that no instrument was actually missing, however rather an extra instrument was counted outside the patient's body at the end of the surgical case. 2. Ascending and proximal transverse colon wall thickening consistent with inflammation secondary to the gallbladder wall perforation and abscesses seen on CT earlier today. 3. Sigmoid colon diverticulosis. Critical Value/emergent results were called by telephone at the time of interpretation on 01/27/2023 at 2:33 pm to provider Maryrose Soja, RN, OR circulator nurse, in the OR, who verbally acknowledged these results. Electronically Signed   By: Bertina Broccoli M.D.   On: 01/27/2023 14:34   CT ABDOMEN PELVIS W CONTRAST Result Date: 01/27/2023 CLINICAL DATA:  Intra-abdominal infection/peritonitis bowel perforation. EXAM: CT ABDOMEN AND PELVIS WITH CONTRAST TECHNIQUE: Multidetector CT imaging of the abdomen and pelvis was performed using the standard protocol following bolus administration of intravenous contrast. RADIATION DOSE REDUCTION: This exam was performed according to the departmental dose-optimization program which includes automated exposure control, adjustment of the mA and/or kV according to patient size and/or use of iterative reconstruction technique. CONTRAST:  OMNIPAQUE  IOHEXOL  300 MG/ML  SOLN COMPARISON:  CT  abdomen pelvis dated 01/26/2023. FINDINGS: Lower chest: Small left pleural effusion and left lung base partial atelectasis or infiltrate. The visualized right lung is clear.  Hepatobiliary: The liver is unremarkable. There is intrahepatic biliary ductal dilatation similar to prior CT. The common bile duct measures approximately 14 mm in diameter. The gallbladder is mildly distended. Several stones noted in the gallbladder. There is diffuse thickened and edematous appearance of the gallbladder wall. Two foci of apparent discontinuity in the gallbladder wall with however indication to the adjacent fluid collection noted. Findings concerning for perforated acute cholecystitis. Pancreas: The pancreas is unremarkable as visualized. Spleen: Normal in size without focal abnormality. Adrenals/Urinary Tract: The adrenal glands are unremarkable. Left renal upper pole atrophy and scarring. Small right renal cyst. There is no hydronephrosis on either side. There is symmetric enhancement and excretion of contrast by both kidneys. The urinary bladder is grossly unremarkable. Stomach/Bowel: Postsurgical changes of esophagectomy and gastric pull-through. Multiple duodenal diverticula noted. There is severe diverticulosis of the sigmoid colon. Diffuse thickened appearance of small bowel loops as well as proximal colon, likely reactive to intra-abdominal abscesses. Contrast traverses into the colon without evidence of small-bowel obstruction. The appendix is unremarkable as visualized. Vascular/Lymphatic: Moderate aortoiliac atherosclerotic disease. 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 Follow-up as per recommendation of prior CT. The IVC is unremarkable. No portal venous gas. Top-normal retroperitoneal lymph nodes measure up to 9 mm short axis. Reproductive: Hysterectomy.  No suspicious adnexal masses. Other: There is diffuse mesenteric edema and small ascites. Multiple loculated fluid collections again noted some of which demonstrate enhancing walls. A collection adjacent to the gallbladder measures approximately 4 x 11 cm appears slightly more organized. A 4.6 x 3.6 cm fluid in the posterior  pelvis also appears slightly more organized compared to prior CT. Musculoskeletal: Osteopenia with degenerative changes of the spine. No acute osseous pathology. IMPRESSION: 1. Multiple fluid collections as seen previously with interval development of some organization in keeping with developing abscesses. Findings likely related to a perforated acute cholecystitis. 2. Postsurgical changes of esophagectomy and gastric pull-through. 3. Severe sigmoid diverticulosis.  No bowel obstruction. 4. Small left pleural effusion and left lung base partial atelectasis or infiltrate. 5.  Aortic Atherosclerosis (ICD10-I70.0). Electronically Signed   By: Angus Bark M.D.   On: 01/27/2023 09:32   CT ABDOMEN PELVIS W CONTRAST Result Date: 01/26/2023 CLINICAL DATA:  68 year old female with abdominal pain EXAM: CT ABDOMEN AND PELVIS WITH CONTRAST TECHNIQUE: Multidetector CT imaging of the abdomen and pelvis was performed using the standard protocol following bolus administration of intravenous contrast. RADIATION DOSE REDUCTION: This exam was performed according to the departmental dose-optimization program which includes automated exposure control, adjustment of the mA and/or kV according to patient size and/or use of iterative reconstruction technique. CONTRAST:  80mL OMNIPAQUE  IOHEXOL  300 MG/ML  SOLN COMPARISON:  CT 01/27/2008 FINDINGS: Lower chest: Scarring at the left medial lung base Hepatobiliary: Hepatic parenchyma relatively unremarkable. There is intrahepatic ductal dilatation of both left and right bile ducts. The common hepatic duct and common bile duct are dilated. No radiopaque stones. There is abrupt transition at the ampulla. Additionally the pancreatic duct is dilated. Extensive in circumferential gallbladder wall thickening and enhancement. The gallbladder is relatively elongated and distended with some high density/enhancing stones versus polyps. There is evidence of gallbladder wall rupture towards the  right side with adjacent low-density fluid and peritoneal enhancement. This fluid collection to the right of the gallbladder is estimated on coronal images 7.1 cm by  3.0 cm. A second fluid and gas collection interposed between the gallbladder and the duodenum is estimated 4.6 cm x 2.3 cm. Pancreas: Pancreatic tissue relatively uniform, though not well evaluated at the head of the pancreas secondary to extensive adjacent inflammatory changes and multi spatial abscess. Pancreatic duct is enlarged. Spleen: Unremarkable Adrenals/Urinary Tract: - Right adrenal gland:  Unremarkable - Left adrenal gland: Unremarkable. - Right kidney: No hydronephrosis, nephrolithiasis, inflammation, or ureteral dilation. No focal lesion. - Left Kidney: No hydronephrosis, nephrolithiasis, inflammation, or ureteral dilation. 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 - Urinary Bladder: Unremarkable. Stomach/Bowel: - Stomach: Surgical changes of esophagectomy and pull-through - Small bowel: Duodenum is adjacent to multifocal abscess. There is questionable defect in the left wall of the duodenum (image 36 of series 2, axial images). This is continuous with the fluid and gas collection adjacent to small bowel. - Appendix: Normal. - Colon: Secondary inflammatory changes in the wall of the right colon. The right colon, transverse colon, left colon are fluid-filled. Left-sided diverticular disease, particularly in the sigmoid colon. No evidence of obstruction. Vascular/Lymphatic: Significant irregular soft plaque of the distal thoracic and upper abdominal aorta. No dissection. Ectasias of the infrarenal abdominal aorta, with transverse diameter measuring 22 mm at the site of saccular outpouching. Mixed calcified and soft plaque throughout the abdominal aorta, with no pedunculated plaque. Bilateral iliac arteries and proximal femoral arteries patent. Mesenteric arteries are patent.  Bilateral renal arteries patent.  Unremarkable venous system. Multiple lymph nodes in the upper abdomen and the retroperitoneal region. Index node in the gastrohepatic ligament measures 7 mm. Subhepatic lymph node on image 31 measures 7 mm. Multiple borderline enlarged lymph nodes in the preaortic/periaortic nodal stations, as well as in the portal caval nodal stations. Reproductive: Hysterectomy Other: Low-density interloop fluid of the small bowel loops as well as ascitic fluid in the pelvis. Associated peritoneal enhancement in the pelvis. Musculoskeletal: No acute displaced fracture. Degenerative changes of the visualized thoracolumbar spine. Degenerative disc disease at L2-L3 and L5-S1 with no bony canal narrowing. IMPRESSION: Multifocal fluid and gas collection of the upper abdomen surrounding the duodenum, potentially representing hollow viscus perforation and/or abscess. There is abnormal appearance of the wall of proximal duodenum on the CT, potentially site of ulceration/perforation. Additionally there is acute cholecystitis with suspected gallbladder wall perforation, contributing 2 complex biloma adjacent to the fundus of the gallbladder, suspected bilious ascites, and CT evidence of bile peritonitis of the abdomen/pelvis. Emergent referral for surgical evaluation is indicated given the above. Although there are high density/enhancing foci in the gallbladder lumen, potentially polyps or gallstones, there is no high density foci within the common bile duct to account for both intrahepatic and extrahepatic biliary ductal dilatation. There is, however, tapering at the distal common bile duct and an ampullary stricture or tumor cannot be excluded. This site may be best evaluated with ERCP, less helpful would be MRCP at this time. Borderline enlarged lymph nodes of the gastrohepatic ligament, subhepatic region, portacaval nodal stations, and retroperitoneum. While these may be reactive given the significant inflammation, a malignant process  cannot be excluded. Surgical changes of esophagectomy and pull-through. These above results discussed by telephone at the time of interpretation on 01/26/2023 at 3:23 pm with APP provider, Fairy Homer, caring for the patient. The right colon wall is edematous and inflamed, presumably reactive given the adjacent presumed biliary ascites. Fluid-filled colon may represent nonspecific enteritis/colitis. Aortic atherosclerosis. 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 Ambulatory referral to vascular team to initiate surveillance would be recommended given the configuration. Additional ancillary findings as above. Signed, Marciano Settles. Rexine Cater, RPVI [facility] Electronically Signed   By: Myrlene Asper D.O.   On: 01/26/2023 15:24    Anti-infectives: Anti-infectives (From admission, onward)    Start     Dose/Rate Route Frequency Ordered Stop   01/26/23 2200  piperacillin -tazobactam (ZOSYN ) IVPB 3.375 g        3.375 g 12.5 mL/hr over 240 Minutes Intravenous Every 8 hours 01/26/23 1532     01/26/23 1926  metroNIDAZOLE  (FLAGYL ) IVPB 500 mg  Status:  Discontinued        500 mg 100 mL/hr over 60 Minutes Intravenous Every 12 hours 01/26/23 1926 01/26/23 1934   01/26/23 1545  piperacillin -tazobactam (ZOSYN ) IVPB 3.375 g        3.375 g 100 mL/hr over 30 Minutes Intravenous  Once 01/26/23 1531 01/26/23 1612       Assessment/Plan: POD 1 open chole for perforated gb, drainage IAA- MW -will give clears as tolerated today -prealbumin less than 5, has not eaten much for over 10 days, discussed PICC and TPN today -oob, pulm toilet -expect prolonged ileus, high risk for IAA postop also  FEN: hyponatremia improving, Mg normal today, continue IV fluids- once starts TPN please make total rate around 100 cc/hour VTE: lovenox , scds ID: [email or web address]  1/5 Dispo remain stepdown  today   Lauren [name] 01/28/2023

## 2023-01-28 NOTE — TOC Initial Note (Signed)
 Transition of Care [facility]) - Initial/Assessment Note    Patient Details  Name: Lauren [name] MRN: [id number] Date of Birth: [birth year]  Transition of Care [facility]) CM/SW Contact:    Ruben Corolla, RN Phone Number: 01/28/2023, 11:53 AM  Clinical Narrative:  POD#1 lap/open chole;TPN awaiting PICC. Monitor for d/c plans.                 Expected Discharge Plan: Home/Self Care Barriers to Discharge: Continued Medical Work up   Patient Goals and CMS Choice            Expected Discharge Plan and Services                                              Prior Living Arrangements/Services                       Activities of Daily Living   ADL Screening (condition at time of admission) Independently performs ADLs?: Yes (appropriate for developmental age) Is the patient deaf or have difficulty hearing?: No Does the patient have difficulty seeing, even when wearing glasses/contacts?: No Does the patient have difficulty concentrating, remembering, or making decisions?: No  Permission Sought/Granted                  Emotional Assessment              Admission diagnosis:  Generalized abdominal pain [R10.84] Perforated bowel (HCC) [K63.1] Patient Active Problem List   Diagnosis Date Noted   Perforated bowel (HCC) 01/26/2023   Perforated gallbladder 01/26/2023   Dysphagia 10/17/2022   Esophageal stricture 10/17/2022   History of malignant neoplasm of esophagus 10/17/2022   Leg pain 06/25/2022   Elevated liver enzymes 07/25/2021   Memory changes 10/15/2020   Ulnar neuropathy 12/27/2019   Abnormal LFTs    Choledocholithiasis with obstruction 11/22/2019   Mass of skin of left thumb 08/31/2019   Mucoid cyst, joint 08/31/2019   Osteoarthritis of finger of left hand 08/31/2019   Hyperglycemia 05/19/2016   Hyperlipidemia 05/19/2016   Hiatal hernia with gastroesophageal reflux 05/22/2013   Postmenopausal atrophic vaginitis 05/22/2013   Vitamin D   deficiency 05/22/2013   Chicken pox    History of shingles    Measles    Cancer (HCC)    Depression    PCP:  Neda Balk, MD Pharmacy:   [facility] [id number] - [place], [place] - [address] AT [address] OF [address] [place] [address] Phone: [phone] Fax: [phone]  [facility] - [facility] [address] [place] [address] Phone: [phone] Fax: [phone]     Social Drivers of Health (SDOH) Social History: SDOH Screenings   Food Insecurity: No Food Insecurity (01/27/2023)  Housing: Unknown (01/27/2023)  Transportation Needs: No Transportation Needs (01/27/2023)  Utilities: Not At Risk (01/27/2023)  Alcohol Screen: Low Risk  (06/12/2022)  Depression (PHQ2-9): Low Risk  (08/18/2022)  Financial Resource Strain: Low Risk  (06/05/2021)  Physical Activity: Insufficiently Active (06/05/2021)  Social Connections: Unknown (01/27/2023)  Stress: No Stress Concern Present (06/05/2021)  Tobacco Use: Medium Risk (01/27/2023)   SDOH Interventions:     Readmission Risk Interventions     No data to display

## 2023-01-29 DIAGNOSIS — K631 Perforation of intestine (nontraumatic): Secondary | ICD-10-CM | POA: Diagnosis not present

## 2023-01-29 DIAGNOSIS — E43 Unspecified severe protein-calorie malnutrition: Secondary | ICD-10-CM | POA: Insufficient documentation

## 2023-01-29 LAB — COMPREHENSIVE METABOLIC PANEL
ALT: 34 U/L (ref 0–44)
AST: 23 U/L (ref 15–41)
Albumin: 2 g/dL — ABNORMAL LOW (ref 3.5–5.0)
Alkaline Phosphatase: 85 U/L (ref 38–126)
Anion gap: 9 (ref 5–15)
BUN: 9 mg/dL (ref 8–23)
CO2: 24 mmol/L (ref 22–32)
Calcium: 7.6 mg/dL — ABNORMAL LOW (ref 8.9–10.3)
Chloride: 93 mmol/L — ABNORMAL LOW (ref 98–111)
Creatinine, Ser: 0.56 mg/dL (ref 0.44–1.00)
GFR, Estimated: >60 mL/min (ref >60)
Glucose, Bld: 158 mg/dL — ABNORMAL HIGH (ref 70–99)
Potassium: 3.3 mmol/L — ABNORMAL LOW (ref 3.5–5.1)
Sodium: 126 mmol/L — ABNORMAL LOW (ref 135–145)
Total Bilirubin: 0.5 mg/dL (ref 0.0–1.2)
Total Protein: 5.4 g/dL — ABNORMAL LOW (ref 6.5–8.1)

## 2023-01-29 LAB — CBC
HCT: 32.1 % — ABNORMAL LOW (ref 36.0–46.0)
Hemoglobin: 10.9 g/dL — ABNORMAL LOW (ref 12.0–15.0)
MCH: 31 pg (ref 26.0–34.0)
MCHC: 34 g/dL (ref 30.0–36.0)
MCV: 91.2 fL (ref 80.0–100.0)
Platelets: 475 10*3/uL — ABNORMAL HIGH (ref 150–400)
RBC: 3.52 MIL/uL — ABNORMAL LOW (ref 3.87–5.11)
RDW: 14 % (ref 11.5–15.5)
WBC: 15.4 10*3/uL — ABNORMAL HIGH (ref 4.0–10.5)
nRBC: 0 % (ref 0.0–0.2)

## 2023-01-29 LAB — GLUCOSE, CAPILLARY
Glucose-Capillary: 127 mg/dL — ABNORMAL HIGH (ref 70–99)
Glucose-Capillary: 146 mg/dL — ABNORMAL HIGH (ref 70–99)
Glucose-Capillary: 161 mg/dL — ABNORMAL HIGH (ref 70–99)
Glucose-Capillary: 165 mg/dL — ABNORMAL HIGH (ref 70–99)

## 2023-01-29 LAB — PHOSPHORUS: Phosphorus: 2.2 mg/dL — ABNORMAL LOW (ref 2.5–4.6)

## 2023-01-29 LAB — MAGNESIUM: Magnesium: 2 mg/dL (ref 1.7–2.4)

## 2023-01-29 MED ORDER — CALCIUM GLUCONATE-NACL 2-0.675 GM/100ML-% IV SOLN
2.0000 g | Freq: Once | INTRAVENOUS | Status: AC
  Administered 2023-01-29: 2000 mg via INTRAVENOUS
  Filled 2023-01-29: qty 100

## 2023-01-29 MED ORDER — POTASSIUM CHLORIDE 10 MEQ/100ML IV SOLN
10.0000 meq | INTRAVENOUS | Status: DC
Start: 2023-01-29 — End: 2023-01-29

## 2023-01-29 MED ORDER — ACETAMINOPHEN 10 MG/ML IV SOLN: 1000.0000 mg | Freq: Four times a day (QID) | INTRAVENOUS | Status: DC

## 2023-01-29 MED ORDER — ACETAMINOPHEN 10 MG/ML IV SOLN
1000.0000 mg | Freq: Four times a day (QID) | INTRAVENOUS | Status: DC
  Administered 2023-01-29 – 2023-01-30 (×3): 1000 mg via INTRAVENOUS
  Filled 2023-01-29 (×3): qty 100

## 2023-01-29 MED ORDER — POTASSIUM CHLORIDE 20 MEQ PO PACK
40.0000 meq | PACK | Freq: Once | ORAL | Status: AC
  Administered 2023-01-29: 40 meq via ORAL
  Filled 2023-01-29: qty 2

## 2023-01-29 MED ORDER — TRAVASOL 10 % IV SOLN
INTRAVENOUS | Status: AC
  Filled 2023-01-29: qty 873.6

## 2023-01-29 MED ORDER — POTASSIUM PHOSPHATES 15 MMOLE/5ML IV SOLN
15.0000 mmol | Freq: Once | INTRAVENOUS | Status: AC
  Administered 2023-01-29: 15 mmol via INTRAVENOUS
  Filled 2023-01-29: qty 5

## 2023-01-29 NOTE — Plan of Care (Signed)
  Problem: Education: Goal: Knowledge of General Education information will improve Description: Including pain rating scale, medication(s)/side effects and non-pharmacologic comfort measures Outcome: Progressing   Problem: Activity: Goal: Risk for activity intolerance will decrease Outcome: Progressing   

## 2023-01-29 NOTE — Progress Notes (Signed)
Antimicrobial Stewardship Pharmacy Note  The patient has an allergy listed to penicillins however has been tolerated piperacillin/tazobactam for 72h now without notable issues.  Previously the patient described having hives when she was 68 years old and N/V with Amoxicillin orally when tried again in 2021. It seems she has outgrown her reaction of hives and N/V is an intolerance and not an allergy. Currently tolerating Zosyn without any N/V.  The patient is agreeable to remove her PCN allergy from the chart - discussed with her today.  Thank you for allowing pharmacy to be a part of this patient's care.  Georgina Pillion, PharmD, BCPS, BCIDP Infectious Diseases Clinical Pharmacist 01/29/2023 10:40 AM   **Pharmacist phone directory can now be found on [email or web address] ([email or web address]).  Listed under [facility].

## 2023-01-29 NOTE — Progress Notes (Signed)
PROGRESS NOTE    Lauren [name]  GNF:[id number] DOB: 24-Apr-1955 DOA: 01/26/2023 PCP: Bradd Canary, MD    Brief Narrative:  68 yo female with past medical history of prediabetes,hyperlipidemia presented to hospital with abdominal pain nausea vomiting diarrhea, for 1 week initially started with nausea and vomiting. In the ED, vitals stable except for tachycardia. Labs with mild hyponatremia hypomagnesemia with magnesium level of 1.4. Elevated LFTs. HIV was nonreactive. Urinalysis was negative. CT abdomen done in the ED showed bowel perforation and walled off abscess as well as cholecystitis and gallbladder perforation. Lipase was 10. Narrowing of CBD-?malignancy unclear. General surgery was consulted and patient was admitted hospital for further evaluation and treatment  Underwent diagnostic laparoscopy with open cholecystectomy with drainage of intra-abdominal abscess by general surgery on 1/14.   Assessment & Plan:  Principal Problem:   Perforated bowel (HCC) Active Problems:   Perforated gallbladder     Acute cholecystitis with gallbladder perforation with possible walled off abscess Status post diagnostic laparoscopy with open cholecystectomy with drainage of abscess 1/14 Antibiotics.  Postop management by general surgery   Hyponatremia/Hypokalemia/hypophosphatemia/hypocalcemia electrolytes as needed.  Will give calcium gluconate.  On TPN   Elevated LFTs. Stable  Hyperlipidemia/prediabetes - Diet controlled  Depression - Venlafaxine  Gen Sx is primary team now, Appreciate their help   DVT prophylaxis: SCDs Start: 01/26/23 1906     Disposition: Home likely in 1 to 2 days when okay with surgery. Code Status: Full code  Family Communication: None at bedside Disposition-postop management by general surgery  Subjective: Doing ok  No complaints.  Passing some gas.   Examination:  General exam: Appears calm and comfortable  Respiratory system: Clear to  auscultation. Respiratory effort normal. Cardiovascular system: S1 & S2 heard, RRR. No JVD, murmurs, rubs, gallops or clicks. No pedal edema. Gastrointestinal system: Abdomen is nondistended, soft and nontender. No organomegaly or masses felt.  No bowel sounds heard yet.  Central nervous system: Alert and oriented. No focal neurological deficits. Extremities: Symmetric 5 x 5 power. Skin: No rashes, lesions or ulcers Psychiatry: Judgement and insight appear normal. Mood & affect appropriate. RLE drain in place.                        Diet Orders (From admission, onward)     Start     Ordered   01/28/23 0843  Diet clear liquid Room service appropriate? Yes; Fluid consistency: Thin  Diet effective now       Question Answer Comment  Room service appropriate? Yes   Fluid consistency: Thin      01/28/23 0843            Objective: Vitals:   01/29/23 0900 01/29/23 1207 01/29/23 1225 01/29/23 1240  BP: 125/66   (!) 148/85  Pulse: 96   88  Resp: 13 14  18   Temp:   98.6 F (37 C) (!) 97.5 F (36.4 C)  TempSrc:   Oral Oral  SpO2: 92% 93%  98%  Weight:      Height:        Intake/Output Summary (Last 24 hours) at 01/29/2023 1242 Last data filed at 01/29/2023 0941 Gross per 24 hour  Intake 2725.83 ml  Output 75 ml  Net 2650.83 ml   Filed Weights   01/27/23 1143 01/29/23 0500  Weight: 55.3 kg 60.8 kg    Scheduled Meds:  Chlorhexidine Gluconate Cloth  6 each Topical Daily   enoxaparin (LOVENOX) injection  40 mg Subcutaneous Q24H   feeding supplement  1 Container Oral Q24H   HYDROmorphone   Intravenous Q4H   insulin aspart  0-9 Units Subcutaneous Q8H   methocarbamol (ROBAXIN) injection  1,000 mg Intravenous Q8H   sodium chloride flush  10-40 mL Intracatheter Q12H   thiamine (VITAMIN B1) injection  100 mg Intravenous Daily   venlafaxine XR  225 mg Oral Q breakfast   Continuous Infusions:  sodium chloride 60 mL/hr at 01/29/23 0941   piperacillin-tazobactam  (ZOSYN)  IV Stopped (01/29/23 0942)   potassium PHOSPHATE IVPB (in mmol) 43 mL/hr at 01/29/23 0941   TPN ADULT (ION) 35 mL/hr at 01/29/23 0941   TPN ADULT (ION)      Nutritional status Signs/Symptoms: energy intake < or equal to 50% for > or equal to 5 days, moderate muscle depletion Interventions: Boost Breeze, TPN Body mass index is 23.01 kg/m.  Data Reviewed:   CBC: Recent Labs  Lab 01/26/23 1214 01/27/23 0308 01/28/23 0309 01/29/23 0305  WBC 7.7 15.3* 17.5* 15.4*  NEUTROABS 6.6  --   --   --   HGB 14.6 12.8 13.7 10.9*  HCT 43.4 38.4 40.6 32.1*  MCV 88.9 91.6 90.0 91.2  PLT 356 343 329 475*   Basic Metabolic Panel: Recent Labs  Lab 01/26/23 1214 01/27/23 0308 01/28/23 0309 01/29/23 0305  NA 133* 129* 132* 126*  K 4.2 3.6 4.1 3.3*  CL 95* 95* 99 93*  CO2 26 24 21* 24  GLUCOSE 183* 120* 89 158*  BUN 13 13 14 9   CREATININE 0.80 0.67 0.66 0.56  CALCIUM 9.2 7.9* 8.0* 7.6*  MG 1.4*  --  2.4 2.0  PHOS  --   --   --  2.2*   GFR: Estimated Creatinine Clearance: 57.3 mL/min (by C-G formula based on SCr of 0.56 mg/dL). Liver Function Tests: Recent Labs  Lab 01/26/23 1214 01/27/23 0308 01/28/23 0309 01/29/23 0305  AST 64* 32 22 23  ALT 101* 73* 43 34  ALKPHOS 132* 96 82 85  BILITOT 0.9 0.8 0.6 0.5  PROT 6.7 5.9* 5.4* 5.4*  ALBUMIN 3.5 2.5* 2.1* 2.0*   Recent Labs  Lab 01/26/23 1214  LIPASE 10*   No results for input(s): "AMMONIA" in the last 168 hours. Coagulation Profile: No results for input(s): "INR", "PROTIME" in the last 168 hours. Cardiac Enzymes: No results for input(s): "CKTOTAL", "CKMB", "CKMBINDEX", "TROPONINI" in the last 168 hours. BNP (last 3 results) No results for input(s): "PROBNP" in the last 8760 hours. HbA1C: No results for input(s): "HGBA1C" in the last 72 hours. CBG: Recent Labs  Lab 01/29/23 0742 01/29/23 1141  GLUCAP 161* 146*   Lipid Profile: No results for input(s): "CHOL", "HDL", "LDLCALC", "TRIG", "CHOLHDL",  "LDLDIRECT" in the last 72 hours. Thyroid Function Tests: No results for input(s): "TSH", "T4TOTAL", "FREET4", "T3FREE", "THYROIDAB" in the last 72 hours. Anemia Panel: No results for input(s): "VITAMINB12", "FOLATE", "FERRITIN", "TIBC", "IRON", "RETICCTPCT" in the last 72 hours. Sepsis Labs: No results for input(s): "PROCALCITON", "LATICACIDVEN" in the last 168 hours.  Recent Results (from the past 240 hours)  MRSA Next Gen by PCR, Nasal     Status: None   Collection Time: 01/27/23  3:10 AM   Specimen: Nasal Mucosa; Nasal Swab  Result Value Ref Range Status   MRSA by PCR Next Gen NOT DETECTED NOT DETECTED Final    Comment: (NOTE) The GeneXpert MRSA Assay (FDA approved for NASAL specimens only), is one component of a comprehensive MRSA colonization  surveillance program. It is not intended to diagnose MRSA infection nor to guide or monitor treatment for MRSA infections. Test performance is not FDA approved in patients less than 57 years old. Performed at [facility], [address], [place], [place] [address]          Radiology Studies: [place] EKG SITE RITE Result Date: 01/28/2023 If Site Rite image not attached, placement could not be confirmed due to current cardiac rhythm.  DG Abd Portable 1V Result Date: 01/27/2023 CLINICAL DATA:  Foreign body and gastrointestinal tract. Wrong frontal count. One extra long metal item found during final count. EXAM: PORTABLE ABDOMEN - 1 VIEW COMPARISON:  CT abdomen and pelvis 01/27/2023 FINDINGS: Mild oral contrast is seen within the ascending, proximal transverse, and descending colon. There is again ascending and proximal transverse colon wall thickening. Lucent air within numerous diverticula in the sigmoid colon. A surgical drain is seen overlying the right hemipelvis with the tip overlying the liver. Surgical clips overlie the mid upper abdomen. Mild contrast excretion into the right renal pelvis and the bilateral ureters. A  small amount of contrast within the urinary bladder around Foley catheter balloon. No long metal foreign body is identified. Severe right femoroacetabular osteoarthritis. IMPRESSION: 1. No metal foreign body is identified. Note is made that no instrument was actually missing, however rather an extra instrument was counted outside the patient's body at the end of the surgical case. 2. Ascending and proximal transverse colon wall thickening consistent with inflammation secondary to the gallbladder wall perforation and abscesses seen on CT earlier today. 3. Sigmoid colon diverticulosis. Critical Value/emergent results were called by telephone at the time of interpretation on 01/27/2023 at 2:33 pm to provider Georgena Spurling, RN, OR circulator nurse, in the OR, who verbally acknowledged these results. Electronically Signed   By: Neita Garnet M.D.   On: 01/27/2023 14:34           LOS: 3 days   Time spent= 35 mins    Miguel Rota, MD [facility]  If 7PM-7AM, please contact night-coverage  01/29/2023, 12:42 PM

## 2023-01-29 NOTE — Progress Notes (Signed)
2 Days Post-Op   Subjective/Chief Complaint: No flatus, otherwise doing well, got oob   Objective: Vital signs in last 24 hours: Temp:  [97.3 F (36.3 C)-98.4 F (36.9 C)] 98.4 F (36.9 C) (01/16 0639) Pulse Rate:  [84-117] 96 (01/16 0600) Resp:  [8-25] 8 (01/16 0600) BP: (110-156)/(48-80) 126/68 (01/16 0600) SpO2:  [89 %-99 %] 89 % (01/16 0600) Weight:  [60.8 kg] 60.8 kg (01/16 0500) Last BM Date :  (PTA)  Intake/Output from previous day: 01/15 0701 - 01/16 0700 In: 2394.8 [I.V.:2011.3; IV Piggyback:383.6] Out: 860 [Urine:800; Drains:60] Intake/Output this shift: Total I/O In: 1310.6 [I.V.:1244.6; IV Piggyback:65.9] Out: 0   General nad Cv regular, tachy when moving Pulm effort normal Ab soft approp tender dressings dry drain serosan  Lab Results:  Recent Labs    01/28/23 0309 01/29/23 0305  WBC 17.5* 15.4*  HGB 13.7 10.9*  HCT 40.6 32.1*  PLT 329 475*   BMET Recent Labs    01/28/23 0309 01/29/23 0305  NA 132* 126*  K 4.1 3.3*  CL 99 93*  CO2 21* 24  GLUCOSE 89 158*  BUN 14 9  CREATININE 0.66 0.56  CALCIUM 8.0* 7.6*   PT/INR No results for input(s): "LABPROT", "INR" in the last 72 hours. ABG No results for input(s): "PHART", "HCO3" in the last 72 hours.  Invalid input(s): "PCO2", "PO2"  Studies/Results: [place] EKG SITE RITE Result Date: 01/28/2023 If Site Rite image not attached, placement could not be confirmed due to current cardiac rhythm.  DG Abd Portable 1V Result Date: 01/27/2023 CLINICAL DATA:  Foreign body and gastrointestinal tract. Wrong frontal count. One extra long metal item found during final count. EXAM: PORTABLE ABDOMEN - 1 VIEW COMPARISON:  CT abdomen and pelvis 01/27/2023 FINDINGS: Mild oral contrast is seen within the ascending, proximal transverse, and descending colon. There is again ascending and proximal transverse colon wall thickening. Lucent air within numerous diverticula in the sigmoid colon. A surgical drain is seen  overlying the right hemipelvis with the tip overlying the liver. Surgical clips overlie the mid upper abdomen. Mild contrast excretion into the right renal pelvis and the bilateral ureters. A small amount of contrast within the urinary bladder around Foley catheter balloon. No long metal foreign body is identified. Severe right femoroacetabular osteoarthritis. IMPRESSION: 1. No metal foreign body is identified. Note is made that no instrument was actually missing, however rather an extra instrument was counted outside the patient's body at the end of the surgical case. 2. Ascending and proximal transverse colon wall thickening consistent with inflammation secondary to the gallbladder wall perforation and abscesses seen on CT earlier today. 3. Sigmoid colon diverticulosis. Critical Value/emergent results were called by telephone at the time of interpretation on 01/27/2023 at 2:33 pm to provider Georgena Spurling, RN, OR circulator nurse, in the OR, who verbally acknowledged these results. Electronically Signed   By: Neita Garnet M.D.   On: 01/27/2023 14:34   CT ABDOMEN PELVIS W CONTRAST Result Date: 01/27/2023 CLINICAL DATA:  Intra-abdominal infection/peritonitis bowel perforation. EXAM: CT ABDOMEN AND PELVIS WITH CONTRAST TECHNIQUE: Multidetector CT imaging of the abdomen and pelvis was performed using the standard protocol following bolus administration of intravenous contrast. RADIATION DOSE REDUCTION: This exam was performed according to the departmental dose-optimization program which includes automated exposure control, adjustment of the mA and/or kV according to patient size and/or use of iterative reconstruction technique. CONTRAST:  OMNIPAQUE IOHEXOL 300 MG/ML  SOLN COMPARISON:  CT abdomen pelvis dated 01/26/2023. FINDINGS: Lower chest:  Small left pleural effusion and left lung base partial atelectasis or infiltrate. The visualized right lung is clear. Hepatobiliary: The liver is unremarkable. There is  intrahepatic biliary ductal dilatation similar to prior CT. The common bile duct measures approximately 14 mm in diameter. The gallbladder is mildly distended. Several stones noted in the gallbladder. There is diffuse thickened and edematous appearance of the gallbladder wall. Two foci of apparent discontinuity in the gallbladder wall with however indication to the adjacent fluid collection noted. Findings concerning for perforated acute cholecystitis. Pancreas: The pancreas is unremarkable as visualized. Spleen: Normal in size without focal abnormality. Adrenals/Urinary Tract: The adrenal glands are unremarkable. Left renal upper pole atrophy and scarring. Small right renal cyst. There is no hydronephrosis on either side. There is symmetric enhancement and excretion of contrast by both kidneys. The urinary bladder is grossly unremarkable. Stomach/Bowel: Postsurgical changes of esophagectomy and gastric pull-through. Multiple duodenal diverticula noted. There is severe diverticulosis of the sigmoid colon. Diffuse thickened appearance of small bowel loops as well as proximal colon, likely reactive to intra-abdominal abscesses. Contrast traverses into the colon without evidence of small-bowel obstruction. The appendix is unremarkable as visualized. Vascular/Lymphatic: Moderate aortoiliac atherosclerotic disease. 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 Follow-up as per recommendation of prior CT. The IVC is unremarkable. No portal venous gas. Top-normal retroperitoneal lymph nodes measure up to 9 mm short axis. Reproductive: Hysterectomy.  No suspicious adnexal masses. Other: There is diffuse mesenteric edema and small ascites. Multiple loculated fluid collections again noted some of which demonstrate enhancing walls. A collection adjacent to the gallbladder measures approximately 4 x 11 cm appears slightly more organized. A 4.6 x 3.6 cm fluid in the posterior pelvis also appears slightly more organized compared  to prior CT. Musculoskeletal: Osteopenia with degenerative changes of the spine. No acute osseous pathology. IMPRESSION: 1. Multiple fluid collections as seen previously with interval development of some organization in keeping with developing abscesses. Findings likely related to a perforated acute cholecystitis. 2. Postsurgical changes of esophagectomy and gastric pull-through. 3. Severe sigmoid diverticulosis.  No bowel obstruction. 4. Small left pleural effusion and left lung base partial atelectasis or infiltrate. 5.  Aortic Atherosclerosis (ICD10-I70.0). Electronically Signed   By: Elgie Collard M.D.   On: 01/27/2023 09:32    Anti-infectives: Anti-infectives (From admission, onward)    Start     Dose/Rate Route Frequency Ordered Stop   01/26/23 2200  piperacillin-tazobactam (ZOSYN) IVPB 3.375 g        3.375 g 12.5 mL/hr over 240 Minutes Intravenous Every 8 hours 01/26/23 1532     01/26/23 1926  metroNIDAZOLE (FLAGYL) IVPB 500 mg  Status:  Discontinued        500 mg 100 mL/hr over 60 Minutes Intravenous Every 12 hours 01/26/23 1926 01/26/23 1934   01/26/23 1545  piperacillin-tazobactam (ZOSYN) IVPB 3.375 g        3.375 g 100 mL/hr over 30 Minutes Intravenous  Once 01/26/23 1531 01/26/23 1612       Assessment/Plan: POD 2 open chole for perforated gb, drainage IAA- MW -clears, TPN -oob, pulm toilet -expect prolonged ileus, high risk for IAA postop also -tachycardia improving  FEN: hyponatremia on TPN, replace potassium, recheck labs am VTE: lovenox, scds ID: [email or web address] 2/5 Anemia: I think equilibrating not bleeding To floor today    Emelia Loron 01/29/2023

## 2023-01-29 NOTE — Progress Notes (Signed)
PHARMACY - TOTAL PARENTERAL NUTRITION CONSULT NOTE   Indication: Prolonged ileus  Patient Measurements: Height: 5\' 4"  (162.6 cm) Weight: 60.8 kg (134 lb 0.6 oz) IBW/kg (Calculated) : 54.7 TPN AdjBW (KG): 55.3 Body mass index is 23.01 kg/m.  Assessment: 68 year old female s/p open chole for perforated gallbladder and drainage of intra-abdominal abscess. Patient with poor oral intake prior to admission, approximately 10 days with very little intake. Expect prolonged ileus, patient also high risk for post op intra-abdominal abscess as well. Pharmacy to manage TPN.  Glucose / Insulin: no hx of DM -CBGs trending up with start of TPN -2u SSI this am Electrolytes: Na down to 126, K 3.3, phos 2.2; all others WNL including corrCa Renal: WNL Hepatic: LFTs WNL, albumin 2 Intake / Output; MIVF:  -NS at 60 ml/hr -No UOP documented since yesterday -Drain with 75 ml out since ysterday GI Imaging: 1/14 CT abd/pelv - multiple fluid collections w/ development of some organization in keeping with developing abscesses; findings likely related to a perforated acute cholecystitis GI Surgeries / Procedures:  1/14 open chole for perforated gallbladder and drainage of intra-abdominal abscess  Central access: PICC  TPN start date: 1/15  Nutritional Goals: Goal TPN rate is 70 mL/hr (provides 87 g of protein and 1710 kcals per day)  RD Assessment: Estimated Needs Total Energy Estimated Needs: 1650-1850 Total Protein Estimated Needs: 80-90 g Total Fluid Estimated Needs: 1.65-1.85 L  Current Nutrition: CLD  Plan:  -K phos 15 mmol + KCl 40 mEq -Ca gluc 2 g IV per MD   -At 1800, advance TPN to 70 mL/hr  -Electrolytes in TPN: Na 154 mEq/L, K 50 mEq/L, Ca 5 mEq/L, Mg 5 mEq/L, and Phos 15 mmol/L. Cl:Ac 2:1 --Will maximize Na to equivalent of NS (154 mEq/L) and stop MIVF at the time of advancement to goal  -Add standard MVI and trace elements to TPN  -Thiamine 100 mg IV daily x 5 days for  refeeding risk  -Continue Sensitive q8h SSI and adjust as needed  -Monitor TPN labs on Mon/Thurs, daily during start given high refeeding risk   Pricilla Riffle, PharmD, BCPS Clinical Pharmacist 01/29/2023 9:33 AM

## 2023-01-30 DIAGNOSIS — K631 Perforation of intestine (nontraumatic): Secondary | ICD-10-CM | POA: Diagnosis not present

## 2023-01-30 LAB — BASIC METABOLIC PANEL
Anion gap: 9 (ref 5–15)
BUN: 9 mg/dL (ref 8–23)
CO2: 26 mmol/L (ref 22–32)
Calcium: 7.7 mg/dL — ABNORMAL LOW (ref 8.9–10.3)
Chloride: 98 mmol/L (ref 98–111)
Creatinine, Ser: <0.3 mg/dL — ABNORMAL LOW (ref 0.44–1.00)
Glucose, Bld: 159 mg/dL — ABNORMAL HIGH (ref 70–99)
Potassium: 3.1 mmol/L — ABNORMAL LOW (ref 3.5–5.1)
Sodium: 133 mmol/L — ABNORMAL LOW (ref 135–145)

## 2023-01-30 LAB — GLUCOSE, CAPILLARY
Glucose-Capillary: 152 mg/dL — ABNORMAL HIGH (ref 70–99)
Glucose-Capillary: 160 mg/dL — ABNORMAL HIGH (ref 70–99)
Glucose-Capillary: 184 mg/dL — ABNORMAL HIGH (ref 70–99)

## 2023-01-30 LAB — CBC
HCT: 30.9 % — ABNORMAL LOW (ref 36.0–46.0)
Hemoglobin: 10.3 g/dL — ABNORMAL LOW (ref 12.0–15.0)
MCH: 31 pg (ref 26.0–34.0)
MCHC: 33.3 g/dL (ref 30.0–36.0)
MCV: 93.1 fL (ref 80.0–100.0)
Platelets: 517 10*3/uL — ABNORMAL HIGH (ref 150–400)
RBC: 3.32 MIL/uL — ABNORMAL LOW (ref 3.87–5.11)
RDW: 14.4 % (ref 11.5–15.5)
WBC: 13.4 10*3/uL — ABNORMAL HIGH (ref 4.0–10.5)
nRBC: 0 % (ref 0.0–0.2)

## 2023-01-30 LAB — PHOSPHORUS: Phosphorus: 2.4 mg/dL — ABNORMAL LOW (ref 2.5–4.6)

## 2023-01-30 LAB — MAGNESIUM: Magnesium: 2.1 mg/dL (ref 1.7–2.4)

## 2023-01-30 MED ORDER — POTASSIUM CHLORIDE 20 MEQ PO PACK
40.0000 meq | PACK | ORAL | Status: AC
  Administered 2023-01-30 (×2): 40 meq via ORAL
  Filled 2023-01-30 (×2): qty 2

## 2023-01-30 MED ORDER — HYDROMORPHONE HCL 1 MG/ML IJ SOLN
0.5000 mg | INTRAMUSCULAR | Status: DC | PRN
Start: 2023-01-30 — End: 2023-02-05
  Administered 2023-01-30 – 2023-02-03 (×26): 1 mg via INTRAVENOUS
  Administered 2023-02-04: 0.5 mg via INTRAVENOUS
  Filled 2023-01-30 (×27): qty 1

## 2023-01-30 MED ORDER — POTASSIUM PHOSPHATES 15 MMOLE/5ML IV SOLN
30.0000 mmol | Freq: Once | INTRAVENOUS | Status: AC
  Administered 2023-01-30: 30 mmol via INTRAVENOUS
  Filled 2023-01-30: qty 10

## 2023-01-30 MED ORDER — TRAVASOL 10 % IV SOLN
INTRAVENOUS | Status: AC
  Filled 2023-01-30: qty 873.6

## 2023-01-30 MED ORDER — DEXTROSE 5 % IV SOLN: 30.0000 mmol | Freq: Once | INTRAVENOUS | Status: DC

## 2023-01-30 MED ORDER — LIDOCAINE 5 % EX PTCH
1.0000 | MEDICATED_PATCH | CUTANEOUS | Status: DC
Start: 2023-01-30 — End: 2023-02-02
  Filled 2023-01-30 (×4): qty 1

## 2023-01-30 MED ORDER — OXYCODONE HCL 5 MG PO TABS
5.0000 mg | ORAL_TABLET | ORAL | Status: DC | PRN
  Administered 2023-01-30 (×2): 5 mg via ORAL
  Administered 2023-02-02 – 2023-02-04 (×9): 10 mg via ORAL
  Filled 2023-01-30 (×4): qty 2
  Filled 2023-01-30: qty 1
  Filled 2023-01-30 (×4): qty 2
  Filled 2023-01-30: qty 1
  Filled 2023-01-30: qty 2

## 2023-01-30 MED ORDER — ACETAMINOPHEN 10 MG/ML IV SOLN
1000.0000 mg | Freq: Four times a day (QID) | INTRAVENOUS | Status: AC
  Administered 2023-01-30 – 2023-01-31 (×4): 1000 mg via INTRAVENOUS
  Filled 2023-01-30 (×4): qty 100

## 2023-01-30 NOTE — Progress Notes (Signed)
PT Cancellation Note  Patient Details Name: Lauren [name] MRN: [id number] DOB: August 07, 1955   Cancelled Treatment:    Reason Eval/Treat Not Completed: Pain limiting ability to participate. Pt stated she needs to get her pain controlled before she can walk, nurse aware. Will follow.   Ralene Bathe Kistler PT 01/30/2023  Acute Rehabilitation Services  Office [phone]

## 2023-01-30 NOTE — Progress Notes (Signed)
Pharmacy Antibiotic Note  Lauren [name] is a 68 y.o. female admitted on 01/26/2023 and Pharmacy has been consulted for Zosyn dosing.  Acute cholecystitis with gallbladder perforation with possible walled off abscess s/p diagnostic laparoscopy with open cholecystectomy with drainage of abscess 1/14.    Plan: Continue Zosyn 3.375g IV Q8H infused over 4hrs.  Pharmacy will sign off notes, but will follow peripherally for renal function, culture results, and clinical course.   Height: 5\' 4"  (162.6 cm) Weight: 60.8 kg (134 lb 0.6 oz) IBW/kg (Calculated) : 54.7  Temp (24hrs), Avg:98.4 F (36.9 C), Min:97.5 F (36.4 C), Max:99.1 F (37.3 C)  Recent Labs  Lab 01/26/23 1214 01/27/23 0308 01/28/23 0309 01/29/23 0305 01/30/23 0248  WBC 7.7 15.3* 17.5* 15.4*  --   CREATININE 0.80 0.67 0.66 0.56 <0.30*    CrCl cannot be calculated (This lab value cannot be used to calculate CrCl because it is not a number: <0.30).    No Active Allergies  Antimicrobials this admission: 1/13 Zosyn >>   Microbiology results: 1/14 MRSA PCR: not detected  Thank you for allowing pharmacy to be a part of this patient's care.  Lynann Beaver PharmD, BCPS [facility] main pharmacy [phone] 01/30/2023 9:48 AM

## 2023-01-30 NOTE — Progress Notes (Signed)
PROGRESS NOTE    Lauren [name]  RUE:[id number] DOB: November 23, 1955 DOA: 01/26/2023 PCP: Bradd Canary, MD    Brief Narrative:  68 yo female with past medical history of prediabetes,hyperlipidemia presented to hospital with abdominal pain nausea vomiting diarrhea, for 1 week initially started with nausea and vomiting. In the ED, vitals stable except for tachycardia. Labs with mild hyponatremia hypomagnesemia with magnesium level of 1.4. Elevated LFTs. HIV was nonreactive. Urinalysis was negative. CT abdomen done in the ED showed bowel perforation and walled off abscess as well as cholecystitis and gallbladder perforation. Lipase was 10. Narrowing of CBD-?malignancy unclear. General surgery was consulted and patient was admitted hospital for further evaluation and treatment  Underwent diagnostic laparoscopy with open cholecystectomy with drainage of intra-abdominal abscess by general surgery on 1/14.   Assessment & Plan:  Principal Problem:   Perforated bowel (HCC) Active Problems:   Perforated gallbladder     Acute cholecystitis with gallbladder perforation with possible walled off abscess Status post diagnostic laparoscopy with open cholecystectomy with drainage of abscess 1/14 Antibiotics.  Postop management by general surgery   Hyponatremia/Hypokalemia/hypophosphatemia/hypocalcemia Aggressive electrolyte repletion as needed.   Elevated LFTs. Stable  Hyperlipidemia/prediabetes - Diet controlled  Depression - Venlafaxine   DVT prophylaxis: Per primary team, general surgery    Disposition: Home likely in 1 to 2 days when okay with surgery. Code Status: Full code  Family Communication: None at bedside Disposition-postop management by general surgery  Subjective: KCL is burning No other complaints.  Not passing gas yet.   Examination:  General exam: Appears calm and comfortable  Respiratory system: Clear to auscultation. Respiratory effort normal. Cardiovascular  system: S1 & S2 heard, RRR. No JVD, murmurs, rubs, gallops or clicks. No pedal edema. Gastrointestinal system: Abdomen is nondistended, soft and nontender. No organomegaly or masses felt.  No bowel sounds heard yet.  Central nervous system: Alert and oriented. No focal neurological deficits. Extremities: Symmetric 5 x 5 power. Skin: No rashes, lesions or ulcers Psychiatry: Judgement and insight appear normal. Mood & affect appropriate. RLE drain in place.  RUE PICC in place.                  Diet Orders (From admission, onward)     Start     Ordered   01/28/23 0843  Diet clear liquid Room service appropriate? Yes; Fluid consistency: Thin  Diet effective now       Question Answer Comment  Room service appropriate? Yes   Fluid consistency: Thin      01/28/23 0843            Objective: Vitals:   01/30/23 0459 01/30/23 0518 01/30/23 0739 01/30/23 0918  BP: 122/88 126/75  135/76  Pulse: 100 95  96  Resp: 18 18 20 17   Temp: 98.5 F (36.9 C) 99 F (37.2 C)  97.9 F (36.6 C)  TempSrc: Oral Oral  Oral  SpO2: 96% 98%  96%  Weight:      Height:        Intake/Output Summary (Last 24 hours) at 01/30/2023 1138 Last data filed at 01/30/2023 1000 Gross per 24 hour  Intake 2888.57 ml  Output 2260 ml  Net 628.57 ml   Filed Weights   01/27/23 1143 01/29/23 0500  Weight: 55.3 kg 60.8 kg    Scheduled Meds:  Chlorhexidine Gluconate Cloth  6 each Topical Daily   enoxaparin (LOVENOX) injection  40 mg Subcutaneous Q24H   feeding supplement  1 Container Oral Q24H  insulin aspart  0-9 Units Subcutaneous Q8H   lidocaine  1 patch Transdermal Q24H   methocarbamol (ROBAXIN) injection  1,000 mg Intravenous Q8H   potassium chloride  40 mEq Oral Q4H   sodium chloride flush  10-40 mL Intracatheter Q12H   thiamine (VITAMIN B1) injection  100 mg Intravenous Daily   venlafaxine XR  225 mg Oral Q breakfast   Continuous Infusions:  acetaminophen     piperacillin-tazobactam  (ZOSYN)  IV 3.375 g (01/30/23 0528)   potassium PHOSPHATE IVPB (in mmol) 30 mmol (01/30/23 1012)   TPN ADULT (ION) 70 mL/hr at 01/29/23 1753   TPN ADULT (ION)      Nutritional status Signs/Symptoms: energy intake < or equal to 50% for > or equal to 5 days, moderate muscle depletion Interventions: Boost Breeze, TPN Body mass index is 23.01 kg/m.  Data Reviewed:   CBC: Recent Labs  Lab 01/26/23 1214 01/27/23 0308 01/28/23 0309 01/29/23 0305  WBC 7.7 15.3* 17.5* 15.4*  NEUTROABS 6.6  --   --   --   HGB 14.6 12.8 13.7 10.9*  HCT 43.4 38.4 40.6 32.1*  MCV 88.9 91.6 90.0 91.2  PLT 356 343 329 475*   Basic Metabolic Panel: Recent Labs  Lab 01/26/23 1214 01/27/23 0308 01/28/23 0309 01/29/23 0305 01/30/23 0248  NA 133* 129* 132* 126* 133*  K 4.2 3.6 4.1 3.3* 3.1*  CL 95* 95* 99 93* 98  CO2 26 24 21* 24 26  GLUCOSE 183* 120* 89 158* 159*  BUN 13 13 14 9 9   CREATININE 0.80 0.67 0.66 0.56 <0.30*  CALCIUM 9.2 7.9* 8.0* 7.6* 7.7*  MG 1.4*  --  2.4 2.0 2.1  PHOS  --   --   --  2.2* 2.4*   GFR: CrCl cannot be calculated (This lab value cannot be used to calculate CrCl because it is not a number: <0.30). Liver Function Tests: Recent Labs  Lab 01/26/23 1214 01/27/23 0308 01/28/23 0309 01/29/23 0305  AST 64* 32 22 23  ALT 101* 73* 43 34  ALKPHOS 132* 96 82 85  BILITOT 0.9 0.8 0.6 0.5  PROT 6.7 5.9* 5.4* 5.4*  ALBUMIN 3.5 2.5* 2.1* 2.0*   Recent Labs  Lab 01/26/23 1214  LIPASE 10*   No results for input(s): "AMMONIA" in the last 168 hours. Coagulation Profile: No results for input(s): "INR", "PROTIME" in the last 168 hours. Cardiac Enzymes: No results for input(s): "CKTOTAL", "CKMB", "CKMBINDEX", "TROPONINI" in the last 168 hours. BNP (last 3 results) No results for input(s): "PROBNP" in the last 8760 hours. HbA1C: No results for input(s): "HGBA1C" in the last 72 hours. CBG: Recent Labs  Lab 01/29/23 0742 01/29/23 1141 01/29/23 1531 01/29/23 2343  01/30/23 0808  GLUCAP 161* 146* 127* 165* 184*   Lipid Profile: No results for input(s): "CHOL", "HDL", "LDLCALC", "TRIG", "CHOLHDL", "LDLDIRECT" in the last 72 hours. Thyroid Function Tests: No results for input(s): "TSH", "T4TOTAL", "FREET4", "T3FREE", "THYROIDAB" in the last 72 hours. Anemia Panel: No results for input(s): "VITAMINB12", "FOLATE", "FERRITIN", "TIBC", "IRON", "RETICCTPCT" in the last 72 hours. Sepsis Labs: No results for input(s): "PROCALCITON", "LATICACIDVEN" in the last 168 hours.  Recent Results (from the past 240 hours)  MRSA Next Gen by PCR, Nasal     Status: None   Collection Time: 01/27/23  3:10 AM   Specimen: Nasal Mucosa; Nasal Swab  Result Value Ref Range Status   MRSA by PCR Next Gen NOT DETECTED NOT DETECTED Final    Comment: (NOTE)  The GeneXpert MRSA Assay (FDA approved for NASAL specimens only), is one component of a comprehensive MRSA colonization surveillance program. It is not intended to diagnose MRSA infection nor to guide or monitor treatment for MRSA infections. Test performance is not FDA approved in patients less than 39 years old. Performed at [facility], [address] [place], [place] [address]          Radiology Studies: No results found.         LOS: 4 days   Time spent= 35 mins    Miguel Rota, MD [facility]  If 7PM-7AM, please contact night-coverage  01/30/2023, 11:38 AM

## 2023-01-30 NOTE — Progress Notes (Signed)
Progress Note  3 Days Post-Op  Subjective: Pt reports incisional pain, does not feel like PCA helps much. Tolerating CLD without n/v, no flatus or BM yet. Walking to bathroom but has not been up much yet. Would like to walk some and get up to chair today.   Objective: Vital signs in last 24 hours: Temp:  [97.5 F (36.4 C)-99.1 F (37.3 C)] 99 F (37.2 C) (01/17 0518) Pulse Rate:  [88-100] 95 (01/17 0518) Resp:  [13-20] 20 (01/17 0739) BP: (113-148)/(63-88) 126/75 (01/17 0518) SpO2:  [92 %-98 %] 98 % (01/17 0518) FiO2 (%):  [30 %] 30 % (01/16 1207) Last BM Date : 01/25/23  Intake/Output from previous day: 01/16 0701 - 01/17 0700 In: 3241.2 [P.O.:540; I.V.:1977.5; IV Piggyback:723.7] Out: 1845 [Urine:1800; Drains:45] Intake/Output this shift: No intake/output data recorded.  PE: General: pleasant, WD, WN female who is laying in bed in NAD Heart: regular, rate, and rhythm.   Lungs:  Respiratory effort nonlabored Abd: soft, appropriately ttp, honeycomb to RUQ incision, drain with SS fluid, mild distention Psych: A&Ox3 with an appropriate affect.    Lab Results:  Recent Labs    01/28/23 0309 01/29/23 0305  WBC 17.5* 15.4*  HGB 13.7 10.9*  HCT 40.6 32.1*  PLT 329 475*   BMET Recent Labs    01/29/23 0305 01/30/23 0248  NA 126* 133*  K 3.3* 3.1*  CL 93* 98  CO2 24 26  GLUCOSE 158* 159*  BUN 9 9  CREATININE 0.56 <0.30*  CALCIUM 7.6* 7.7*   PT/INR No results for input(s): "LABPROT", "INR" in the last 72 hours. CMP     Component Value Date/Time   NA 133 (L) 01/30/2023 0248   K 3.1 (L) 01/30/2023 0248   CL 98 01/30/2023 0248   CO2 26 01/30/2023 0248   GLUCOSE 159 (H) 01/30/2023 0248   BUN 9 01/30/2023 0248   CREATININE <0.30 (L) 01/30/2023 0248   CREATININE 0.81 01/23/2015 1022   CALCIUM 7.7 (L) 01/30/2023 0248   PROT 5.4 (L) 01/29/2023 0305   ALBUMIN 2.0 (L) 01/29/2023 0305   AST 23 01/29/2023 0305   ALT 34 01/29/2023 0305   ALKPHOS 85 01/29/2023  0305   BILITOT 0.5 01/29/2023 0305   GFRNONAA NOT CALCULATED 01/30/2023 0248   GFRAA  05/03/2008 0425    >60        The eGFR has been calculated using the MDRD equation. This calculation has not been validated in all clinical situations. eGFR's persistently <60 mL/min signify possible Chronic Kidney Disease.   Lipase     Component Value Date/Time   LIPASE 10 (L) 01/26/2023 1214       Studies/Results: No results found.  Anti-infectives: Anti-infectives (From admission, onward)    Start     Dose/Rate Route Frequency Ordered Stop   01/26/23 2200  piperacillin-tazobactam (ZOSYN) IVPB 3.375 g        3.375 g 12.5 mL/hr over 240 Minutes Intravenous Every 8 hours 01/26/23 1532     01/26/23 1926  metroNIDAZOLE (FLAGYL) IVPB 500 mg  Status:  Discontinued        500 mg 100 mL/hr over 60 Minutes Intravenous Every 12 hours 01/26/23 1926 01/26/23 1934   01/26/23 1545  piperacillin-tazobactam (ZOSYN) IVPB 3.375 g        3.375 g 100 mL/hr over 30 Minutes Intravenous  Once 01/26/23 1531 01/26/23 1612        Assessment/Plan  POD3 s/p open cholecystectomy for perforated gallbladder and drainage of intraabdominal  abscess Dr. Dwain Sarna - tolerating CLD, no bowel function yet - await bowel function to advance  - continue TPN - mobilize more today - drain ss with 45 cc/24h - continue for now  - adjusted pain meds, DC PCA ABL anemia - more likely equilibration than active bleed given drain output and stable vitals, hgb 10.9 yesterday. Add on CBC today   FEN: CLD, TPN VTE: LMWH ID: Zosyn   Hx of esophageal cancer s/p resection  HTN HLD GERD Depression  LOS: 4 days     Juliet Rude, [facility] Surgery 01/30/2023, 8:49 AM Please see Amion for pager number during day hours 7:00am-4:30pm

## 2023-01-30 NOTE — Progress Notes (Signed)
Nutrition Follow-up  DOCUMENTATION CODES:   Severe malnutrition in context of acute illness/injury  INTERVENTION:  -Plan to continue TPN   TPN management per pharmacy -Clear liquid diet per surgery -Increase Boost Breeze BID -Encouraged pt to continue having any clear liquids as tolerated  NUTRITION DIAGNOSIS:   Severe Malnutrition related to acute illness (perforated gallbladder) as evidenced by energy intake < or equal to 50% for > or equal to 5 days, moderate muscle depletion.  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 Pt admitted for perforated bowel/gallbladder after experiencing severe abdominal pain, vomiting, and diarrhea for the past few days. Pt had cholecystectomy yesterday 01/27/23.  1/13- Admit 1/15- TPN initiated; clear liquid diet started  Pt advanced to goal rate of TPN last night and remains on clear liquids. Pt reported not feeling well today and wants to start moving around more. Pt reports not having a bowel movement since earlier this week and does not feel like she will have one anytime soon.  Pt reported never receiving a Boost Breeze but wants to try it. A Boost Breeze and cranberry juice was given to her after my visit. Pt reports liking the [place] ice and juices that have been provided but strongly disliking the broths because of their saltiness.   Admit weight: 122 lb Current weight: 134 lb I&O: +5.4 L  Meds 100 mg thiamine  Labs: Potassium low, phosphorus low, A1C 6.2 (12/2022)  Diet Order:   Diet Order             Diet clear liquid Room service appropriate? Yes; Fluid consistency: Thin  Diet  effective now                   EDUCATION NEEDS:   Education needs have been addressed  Skin:  Skin Assessment: Reviewed RN Assessment  Last BM:  1/12  Height:   Ht Readings from Last 1 Encounters:  01/27/23 5\' 4"  (1.626 m)    Weight:   Wt Readings from Last 1 Encounters:  01/29/23 60.8 kg    BMI:  Body mass index is 23.01 kg/m.  Estimated Nutritional Needs:   Kcal:  1650-1850  Protein:  80-90 g  Fluid:  1.65-1.85 L   Maceo Pro, MS Dietetic Intern

## 2023-01-30 NOTE — Progress Notes (Signed)
PHARMACY - TOTAL PARENTERAL NUTRITION CONSULT NOTE   Indication: Prolonged ileus  Patient Measurements: Height: 5\' 4"  (162.6 cm) Weight: 60.8 kg (134 lb 0.6 oz) IBW/kg (Calculated) : 54.7 TPN AdjBW (KG): 55.3 Body mass index is 23.01 kg/m.  Assessment: 68 year old female s/p open chole for perforated gallbladder and drainage of intra-abdominal abscess. Patient with poor oral intake prior to admission, approximately 10 days with very little intake. Expect prolonged ileus, patient also high risk for post op intra-abdominal abscess as well. Pharmacy to manage TPN.  Glucose / Insulin: no hx of DM - CBGs trending up with start of TPN, range 127-165 - sSSI:  5 units / 24 hrs Electrolytes: Na low/improved to 133, K 3.1, Phos 2.4 - CorrCa, Mag, Cl, CO2 WNL Renal: WNL Hepatic: LFTs WNL, albumin 2 Intake / Output; MIVF:  - PO Intake: 540 mL - Output: 1800 mL urine, 45 mL drains - Stool: (none) GI Imaging: 1/14 CT abd/pelv - multiple fluid collections w/ development of some organization in keeping with developing abscesses; findings likely related to a perforated acute cholecystitis GI Surgeries / Procedures:  1/14 open chole for perforated gallbladder and drainage of intra-abdominal abscess  Central access: PICC  TPN start date: 1/15  Nutritional Goals: Goal TPN rate is 70 mL/hr (provides 87 g of protein and 1710 kcals per day)  RD Assessment: Estimated Needs Total Energy Estimated Needs: 1650-1850 Total Protein Estimated Needs: 80-90 g Total Fluid Estimated Needs: 1.65-1.85 L  Current Nutrition: CLD, TPN  Plan:  Now:  - KPhos 30 mmol IV per MD - KCl 40 mEq PO x2 per MD  At 18:00 - Continue TPN at goal rate, 70 mL/hr Electrolytes in TPN: Na 154 mEq/L (max), K 50 mEq/L, Ca 5 mEq/L, Mg 5 mEq/L, and Phos 15 mmol/L. Cl:Ac 2:1 Add standard MVI and trace elements to TPN  -Thiamine 100 mg IV daily x 5 days for refeeding risk - mIVF d/c -Continue Sensitive q8h SSI and adjust as  needed -Monitor TPN labs on Mon/Thurs, daily during start given high refeeding risk  Lynann Beaver PharmD, BCPS [facility] main pharmacy [phone] 01/30/2023 7:30 AM

## 2023-01-30 NOTE — Progress Notes (Signed)
Physical Therapy Treatment Patient Details Name: Lauren [name] MRN: [id number] DOB: 1955/06/03 Today's Date: 01/30/2023   History of Present Illness Pt is 68 yo female admitted on 01/26/23 with perforated gallbladder and is s/p open chole on 01/27/23. Pt with hx including but not limited to prediabetic, HLD, open hysterectomy    PT Comments  Pt tolerated increased ambulation distance of 180' with RW, no loss of balance, 8/10 abdominal pain. Instructed pt in log roll technique for bed mobility to minimize strain to incision.     If plan is discharge home, recommend the following: Assistance with cooking/housework;Help with stairs or ramp for entrance;A little help with bathing/dressing/bathroom   Can travel by private vehicle        Equipment Recommendations  None recommended by PT    Recommendations for Other Services       Precautions / Restrictions Precautions Precautions: Fall Precaution Comments: JP drain Restrictions Weight Bearing Restrictions Per Provider Order: No     Mobility  Bed Mobility Overal bed mobility: Needs Assistance Bed Mobility: Rolling, Sidelying to Sit Rolling: Supervision, Used rails Sidelying to sit: Min assist       General bed mobility comments: VCs for log roll, min A to raise trunk    Transfers Overall transfer level: Needs assistance Equipment used: None, Rolling walker (2 wheels) Transfers: Sit to/from Stand Sit to Stand: Supervision                Ambulation/Gait Ambulation/Gait assistance: Contact guard assist Gait Distance (Feet): 180 Feet Assistive device: Rolling walker (2 wheels) Gait Pattern/deviations: Step-through pattern, Decreased stride length, Wide base of support Gait velocity: decreased     General Gait Details: no loss of balance   Stairs             Wheelchair Mobility     Tilt Bed    Modified Rankin (Stroke Patients Only)       Balance Overall balance assessment: Needs  assistance Sitting-balance support: No upper extremity supported Sitting balance-Leahy Scale: Good     Standing balance support: No upper extremity supported, Bilateral upper extremity supported Standing balance-Leahy Scale: Fair Standing balance comment: RW to ambulate but could static stand without AD                            Cognition Arousal: Alert Behavior During Therapy: WFL for tasks assessed/performed Overall Cognitive Status: Within Functional Limits for tasks assessed                                          Exercises      General Comments        Pertinent Vitals/Pain Pain Assessment Pain Score: 8  Pain Location: abdomen with walking Pain Descriptors / Indicators: Discomfort Pain Intervention(s): Limited activity within patient's tolerance, Monitored during session, Premedicated before session, Patient requesting pain meds-RN notified    Home Living                          Prior Function            PT Goals (current goals can now be found in the care plan section) Acute Rehab PT Goals Patient Stated Goal: return home PT Goal Formulation: With patient Time For Goal Achievement: 02/11/23 Potential to Achieve Goals: Good Progress towards PT goals:  Progressing toward goals    Frequency    Min 1X/week      PT Plan      Co-evaluation              AM-PAC PT "6 Clicks" Mobility   Outcome Measure  Help needed turning from your back to your side while in a flat bed without using bedrails?: None Help needed moving from lying on your back to sitting on the side of a flat bed without using bedrails?: A Little Help needed moving to and from a bed to a chair (including a wheelchair)?: None Help needed standing up from a chair using your arms (e.g., wheelchair or bedside chair)?: None Help needed to walk in hospital room?: None Help needed climbing 3-5 steps with a railing? : A Little 6 Click Score: 22     End of Session   Activity Tolerance: Patient tolerated treatment well;Patient limited by pain Patient left: in chair;with call bell/phone within reach;with nursing/sitter in room Nurse Communication: Mobility status PT Visit Diagnosis: Other abnormalities of gait and mobility (R26.89);Pain     Time: 1660-6301 PT Time Calculation (min) (ACUTE ONLY): 19 min  Charges:    $Gait Training: 8-22 mins PT General Charges $$ ACUTE PT VISIT: 1 Visit                     Tamala Ser PT 01/30/2023  Acute Rehabilitation Services  Office [phone]

## 2023-01-30 NOTE — Plan of Care (Signed)
  Problem: Activity: Goal: Risk for activity intolerance will decrease Outcome: Progressing   Problem: Coping: Goal: Level of anxiety will decrease Outcome: Progressing   Problem: Elimination: Goal: Will not experience complications related to urinary retention Outcome: Progressing   Problem: Pain Management: Goal: General experience of comfort will improve Outcome: Progressing   Problem: Safety: Goal: Ability to remain free from injury will improve Outcome: Progressing   Problem: Skin Integrity: Goal: Risk for impaired skin integrity will decrease Outcome: Progressing

## 2023-01-31 DIAGNOSIS — K631 Perforation of intestine (nontraumatic): Secondary | ICD-10-CM | POA: Diagnosis not present

## 2023-01-31 LAB — BASIC METABOLIC PANEL
Anion gap: 5 (ref 5–15)
BUN: 11 mg/dL (ref 8–23)
CO2: 28 mmol/L (ref 22–32)
Calcium: 7.9 mg/dL — ABNORMAL LOW (ref 8.9–10.3)
Chloride: 100 mmol/L (ref 98–111)
Creatinine, Ser: 0.51 mg/dL (ref 0.44–1.00)
GFR, Estimated: >60 mL/min (ref >60)
Glucose, Bld: 150 mg/dL — ABNORMAL HIGH (ref 70–99)
Potassium: 3.7 mmol/L (ref 3.5–5.1)
Sodium: 133 mmol/L — ABNORMAL LOW (ref 135–145)

## 2023-01-31 LAB — GLUCOSE, CAPILLARY
Glucose-Capillary: 164 mg/dL — ABNORMAL HIGH (ref 70–99)
Glucose-Capillary: 167 mg/dL — ABNORMAL HIGH (ref 70–99)

## 2023-01-31 LAB — PHOSPHORUS: Phosphorus: 3.4 mg/dL (ref 2.5–4.6)

## 2023-01-31 LAB — MAGNESIUM: Magnesium: 1.9 mg/dL (ref 1.7–2.4)

## 2023-01-31 MED ORDER — ADULT MULTIVITAMIN W/MINERALS CH
1.0000 | ORAL_TABLET | Freq: Every day | ORAL | Status: DC
  Administered 2023-02-01 – 2023-02-05 (×5): 1 via ORAL
  Filled 2023-01-31 (×5): qty 1

## 2023-01-31 MED ORDER — TRAVASOL 10 % IV SOLN
INTRAVENOUS | Status: AC
  Filled 2023-01-31: qty 873.6

## 2023-01-31 MED ORDER — POTASSIUM CHLORIDE 20 MEQ PO PACK
40.0000 meq | PACK | ORAL | Status: AC
  Administered 2023-01-31 (×2): 40 meq via ORAL
  Filled 2023-01-31 (×2): qty 2

## 2023-01-31 NOTE — Progress Notes (Signed)
Progress Note  4 Days Post-Op  Subjective: Endorsing ongoing incisional pain and overall frustration with her situation but is tolerating PO and denies N/V or bloating.   Objective: Vital signs in last 24 hours: Temp:  [97.5 F (36.4 C)-99 F (37.2 C)] 98.5 F (36.9 C) (01/18 0528) Pulse Rate:  [94-99] 94 (01/18 0528) Resp:  [16-18] 16 (01/18 0528) BP: (123-142)/(63-83) 137/77 (01/18 0528) SpO2:  [96 %-98 %] 98 % (01/18 0528) Weight:  [62.2 kg] 62.2 kg (01/18 0500) Last BM Date : 01/25/23  Intake/Output from previous day: 01/17 0701 - 01/18 0700 In: 1887.4 [P.O.:240; I.V.:959.9; IV Piggyback:687.5] Out: 2675 [Urine:2600; Drains:75] Intake/Output this shift: Total I/O In: -  Out: 500 [Urine:500]  PE: General: pleasant, WD, WN female who is laying in bed in NAD Heart: regular, rate, and rhythm.   Lungs:  Respiratory effort nonlabored Abd: soft, appropriately ttp, honeycomb to RUQ incision, drain with SS fluid, mild distention Psych: A&Ox3 with an appropriate affect.    Lab Results:  Recent Labs    01/29/23 0305 01/30/23 1327  WBC 15.4* 13.4*  HGB 10.9* 10.3*  HCT 32.1* 30.9*  PLT 475* 517*   BMET Recent Labs    01/30/23 0248 01/31/23 0144  NA 133* 133*  K 3.1* 3.7  CL 98 100  CO2 26 28  GLUCOSE 159* 150*  BUN 9 11  CREATININE <0.30* 0.51  CALCIUM 7.7* 7.9*   PT/INR No results for input(s): "LABPROT", "INR" in the last 72 hours. CMP     Component Value Date/Time   NA 133 (L) 01/31/2023 0144   K 3.7 01/31/2023 0144   CL 100 01/31/2023 0144   CO2 28 01/31/2023 0144   GLUCOSE 150 (H) 01/31/2023 0144   BUN 11 01/31/2023 0144   CREATININE 0.51 01/31/2023 0144   CREATININE 0.81 01/23/2015 1022   CALCIUM 7.9 (L) 01/31/2023 0144   PROT 5.4 (L) 01/29/2023 0305   ALBUMIN 2.0 (L) 01/29/2023 0305   AST 23 01/29/2023 0305   ALT 34 01/29/2023 0305   ALKPHOS 85 01/29/2023 0305   BILITOT 0.5 01/29/2023 0305   GFRNONAA >60 01/31/2023 0144   GFRAA   05/03/2008 0425    >60        The eGFR has been calculated using the MDRD equation. This calculation has not been validated in all clinical situations. eGFR's persistently <60 mL/min signify possible Chronic Kidney Disease.   Lipase     Component Value Date/Time   LIPASE 10 (L) 01/26/2023 1214       Studies/Results: No results found.  Anti-infectives: Anti-infectives (From admission, onward)    Start     Dose/Rate Route Frequency Ordered Stop   01/26/23 2200  piperacillin-tazobactam (ZOSYN) IVPB 3.375 g        3.375 g 12.5 mL/hr over 240 Minutes Intravenous Every 8 hours 01/26/23 1532     01/26/23 1926  metroNIDAZOLE (FLAGYL) IVPB 500 mg  Status:  Discontinued        500 mg 100 mL/hr over 60 Minutes Intravenous Every 12 hours 01/26/23 1926 01/26/23 1934   01/26/23 1545  piperacillin-tazobactam (ZOSYN) IVPB 3.375 g        3.375 g 100 mL/hr over 30 Minutes Intravenous  Once 01/26/23 1531 01/26/23 1612        Assessment/Plan  POD4 s/p open cholecystectomy for perforated gallbladder and drainage of intraabdominal abscess Dr. Dwain Sarna - tolerating CLD, will advance to FLD - continue TPN - mobilize more today - drain ss with 75  cc/24h - continue for now   FEN: CLD, TPN VTE: LMWH ID: Zosyn   Hx of esophageal cancer s/p resection  HTN HLD GERD Depression  LOS: 5 days     Moise Boring, MD [facility] Surgery 01/31/2023, 11:23 AM Please see Amion for pager number during day hours 7:00am-4:30pm

## 2023-01-31 NOTE — Progress Notes (Signed)
Mobility Specialist - Progress Note   01/31/23 0911  Mobility  Activity Ambulated with assistance in hallway  Level of Assistance Independent after set-up  Assistive Device Other (Comment) (IV Pole)  Distance Ambulated (ft) 500 ft  Activity Response Tolerated well  Mobility Referral Yes  Mobility visit 1 Mobility  Mobility Specialist Start Time (ACUTE ONLY) 0901  Mobility Specialist Stop Time (ACUTE ONLY) 0910  Mobility Specialist Time Calculation (min) (ACUTE ONLY) 9 min   Pt received in bed and agreeable to mobility. No complaints during session. Pt required assistance with legs when getting back into bed. Pt to bed after session with all needs met.    [facility]

## 2023-01-31 NOTE — Progress Notes (Signed)
PROGRESS NOTE    Lauren [name]  HKV:[id number] DOB: October 27, 1955 DOA: 01/26/2023 PCP: Bradd Canary, MD    Brief Narrative:  68 yo female with past medical history of prediabetes,hyperlipidemia presented to hospital with abdominal pain nausea vomiting diarrhea, for 1 week initially started with nausea and vomiting. In the ED, vitals stable except for tachycardia. Labs with mild hyponatremia hypomagnesemia with magnesium level of 1.4. Elevated LFTs. HIV was nonreactive. Urinalysis was negative. CT abdomen done in the ED showed bowel perforation and walled off abscess as well as cholecystitis and gallbladder perforation. Lipase was 10. Narrowing of CBD-?malignancy unclear. General surgery was consulted and patient was admitted hospital for further evaluation and treatment  Underwent diagnostic laparoscopy with open cholecystectomy with drainage of intra-abdominal abscess by general surgery on 1/14.   Assessment & Plan:  Principal Problem:   Perforated bowel (HCC) Active Problems:   Perforated gallbladder     Acute cholecystitis with gallbladder perforation with possible walled off abscess Status post diagnostic laparoscopy with open cholecystectomy with drainage of abscess 1/14 Antibiotics.  Postop management by general surgery   Hyponatremia/Hypokalemia/hypophosphatemia/hypocalcemia Aggressive electrolyte repletion as needed.   Elevated LFTs. Stable  Hyperlipidemia/prediabetes - Diet controlled  Depression - Venlafaxine   DVT prophylaxis: Per primary team, general surgery   Code Status: Full code  Family Communication: None at bedside Disposition-postop management by general surgery  Subjective: Feels weak, abdomen is still hurting her.  Examination:  General exam: Appears calm and comfortable  Respiratory system: Clear to auscultation. Respiratory effort normal. Cardiovascular system: S1 & S2 heard, RRR. No JVD, murmurs, rubs, gallops or clicks. No pedal  edema. Gastrointestinal system: Abdomen is nondistended, soft and nontender. No organomegaly or masses felt.  No bowel sounds heard yet.  Central nervous system: Alert and oriented. No focal neurological deficits. Extremities: Symmetric 5 x 5 power. Skin: No rashes, lesions or ulcers Psychiatry: Judgement and insight appear normal. Mood & affect appropriate. RLE drain in place.  RUE PICC in place.                  Diet Orders (From admission, onward)     Start     Ordered   01/31/23 1123  Diet full liquid Room service appropriate? Yes; Fluid consistency: Thin  Diet effective now       Question Answer Comment  Room service appropriate? Yes   Fluid consistency: Thin      01/31/23 1123            Objective: Vitals:   01/30/23 1345 01/30/23 2039 01/31/23 0500 01/31/23 0528  BP: 123/63 (!) 142/83  137/77  Pulse: 99 96  94  Resp: 17 18  16   Temp: 99 F (37.2 C) (!) 97.5 F (36.4 C)  98.5 F (36.9 C)  TempSrc: Oral Oral  Oral  SpO2: 96% 98%  98%  Weight:   62.2 kg   Height:        Intake/Output Summary (Last 24 hours) at 01/31/2023 1143 Last data filed at 01/31/2023 0901 Gross per 24 hour  Intake 1256.08 ml  Output 2345 ml  Net -1088.92 ml   Filed Weights   01/27/23 1143 01/29/23 0500 01/31/23 0500  Weight: 55.3 kg 60.8 kg 62.2 kg    Scheduled Meds:  Chlorhexidine Gluconate Cloth  6 each Topical Daily   enoxaparin (LOVENOX) injection  40 mg Subcutaneous Q24H   feeding supplement  1 Container Oral Q24H   insulin aspart  0-9 Units Subcutaneous Q8H   lidocaine  1 patch Transdermal Q24H   methocarbamol (ROBAXIN) injection  1,000 mg Intravenous Q8H   multivitamin with minerals  1 tablet Oral Daily   sodium chloride flush  10-40 mL Intracatheter Q12H   thiamine (VITAMIN B1) injection  100 mg Intravenous Daily   venlafaxine XR  225 mg Oral Q breakfast   Continuous Infusions:  piperacillin-tazobactam (ZOSYN)  IV 3.375 g (01/31/23 0559)   TPN ADULT (ION)  70 mL/hr at 01/30/23 1835   TPN ADULT (ION)      Nutritional status Signs/Symptoms: energy intake < or equal to 50% for > or equal to 5 days, moderate muscle depletion Interventions: Boost Breeze, TPN Body mass index is 23.54 kg/m.  Data Reviewed:   CBC: Recent Labs  Lab 01/26/23 1214 01/27/23 0308 01/28/23 0309 01/29/23 0305 01/30/23 1327  WBC 7.7 15.3* 17.5* 15.4* 13.4*  NEUTROABS 6.6  --   --   --   --   HGB 14.6 12.8 13.7 10.9* 10.3*  HCT 43.4 38.4 40.6 32.1* 30.9*  MCV 88.9 91.6 90.0 91.2 93.1  PLT 356 343 329 475* 517*   Basic Metabolic Panel: Recent Labs  Lab 01/26/23 1214 01/27/23 0308 01/28/23 0309 01/29/23 0305 01/30/23 0248 01/31/23 0144  NA 133* 129* 132* 126* 133* 133*  K 4.2 3.6 4.1 3.3* 3.1* 3.7  CL 95* 95* 99 93* 98 100  CO2 26 24 21* 24 26 28   GLUCOSE 183* 120* 89 158* 159* 150*  BUN 13 13 14 9 9 11   CREATININE 0.80 0.67 0.66 0.56 <0.30* 0.51  CALCIUM 9.2 7.9* 8.0* 7.6* 7.7* 7.9*  MG 1.4*  --  2.4 2.0 2.1 1.9  PHOS  --   --   --  2.2* 2.4* 3.4   GFR: Estimated Creatinine Clearance: 57.3 mL/min (by C-G formula based on SCr of 0.51 mg/dL). Liver Function Tests: Recent Labs  Lab 01/26/23 1214 01/27/23 0308 01/28/23 0309 01/29/23 0305  AST 64* 32 22 23  ALT 101* 73* 43 34  ALKPHOS 132* 96 82 85  BILITOT 0.9 0.8 0.6 0.5  PROT 6.7 5.9* 5.4* 5.4*  ALBUMIN 3.5 2.5* 2.1* 2.0*   Recent Labs  Lab 01/26/23 1214  LIPASE 10*   No results for input(s): "AMMONIA" in the last 168 hours. Coagulation Profile: No results for input(s): "INR", "PROTIME" in the last 168 hours. Cardiac Enzymes: No results for input(s): "CKTOTAL", "CKMB", "CKMBINDEX", "TROPONINI" in the last 168 hours. BNP (last 3 results) No results for input(s): "PROBNP" in the last 8760 hours. HbA1C: No results for input(s): "HGBA1C" in the last 72 hours. CBG: Recent Labs  Lab 01/29/23 2343 01/30/23 0808 01/30/23 1614 01/30/23 2347 01/31/23 0742  GLUCAP 165* 184* 160*  152* 164*   Lipid Profile: No results for input(s): "CHOL", "HDL", "LDLCALC", "TRIG", "CHOLHDL", "LDLDIRECT" in the last 72 hours. Thyroid Function Tests: No results for input(s): "TSH", "T4TOTAL", "FREET4", "T3FREE", "THYROIDAB" in the last 72 hours. Anemia Panel: No results for input(s): "VITAMINB12", "FOLATE", "FERRITIN", "TIBC", "IRON", "RETICCTPCT" in the last 72 hours. Sepsis Labs: No results for input(s): "PROCALCITON", "LATICACIDVEN" in the last 168 hours.  Recent Results (from the past 240 hours)  MRSA Next Gen by PCR, Nasal     Status: None   Collection Time: 01/27/23  3:10 AM   Specimen: Nasal Mucosa; Nasal Swab  Result Value Ref Range Status   MRSA by PCR Next Gen NOT DETECTED NOT DETECTED Final    Comment: (NOTE) The GeneXpert MRSA Assay (FDA approved for NASAL specimens only),  is one component of a comprehensive MRSA colonization surveillance program. It is not intended to diagnose MRSA infection nor to guide or monitor treatment for MRSA infections. Test performance is not FDA approved in patients less than 60 years old. Performed at [facility], [address] [place], [place] [address]          Radiology Studies: No results found.         LOS: 5 days   Time spent= 35 mins    Miguel Rota, MD [facility]  If 7PM-7AM, please contact night-coverage  01/31/2023, 11:43 AM

## 2023-01-31 NOTE — Progress Notes (Addendum)
PHARMACY - TOTAL PARENTERAL NUTRITION CONSULT NOTE   Indication: Prolonged ileus  Patient Measurements: Height: 5\' 4"  (162.6 cm) Weight: 62.2 kg (137 lb 2 oz) IBW/kg (Calculated) : 54.7 TPN AdjBW (KG): 55.3 Body mass index is 23.54 kg/m.  Assessment: 68 year old female s/p open chole for perforated gallbladder and drainage of intra-abdominal abscess. Patient with poor oral intake prior to admission, approximately 10 days with very little intake. Expect prolonged ileus, patient also high risk for post op intra-abdominal abscess as well. Pharmacy to manage TPN.  Glucose / Insulin: no hx of DM - CBGs trending up with start of TPN, range 152-184 - sSSI: 6 units / 24 hrs Electrolytes: Na low/stable at 133.  K and Phos improved to WNL.  CorrCa, Mag, Cl, CO2 WNL Renal: WNL Hepatic: LFTs WNL, albumin 2 Intake / Output; MIVF:  - Output: 2600 mL urine, 75 mL drains - Stool: (none) - RN reports she is tolerating CLD, no nausea GI Imaging: 1/14 CT abd/pelv - multiple fluid collections w/ development of some organization in keeping with developing abscesses; findings likely related to a perforated acute cholecystitis GI Surgeries / Procedures:  1/14 open chole for perforated gallbladder and drainage of intra-abdominal abscess  Central access: PICC  TPN start date: 1/15  Nutritional Goals: Goal TPN rate is 70 mL/hr (provides 87 g of protein and 1710 kcals per day)  RD Assessment: Estimated Needs Total Energy Estimated Needs: 1650-1850 Total Protein Estimated Needs: 80-90 g Total Fluid Estimated Needs: 1.65-1.85 L  Current Nutrition: CLD advance to FLD.  TPN  Plan:  Now: KCl 40 mEq PO x2 per MD  At 18:00 - Continue TPN at goal rate, 70 mL/hr Electrolytes in TPN: Na 154 mEq/L (max), K 50 mEq/L, Ca 5 mEq/L, Mg 5 mEq/L, and Phos 15 mmol/L. Cl:Ac 2:1  - Change to oral MVI/trace elements -Thiamine 100 mg IV daily x 5 days for refeeding risk (1/15-1/19) - mIVF d/c -Continue Sensitive  q8h SSI and adjust as needed -Monitor TPN labs on Mon/Thurs, and prn.   Lynann Beaver PharmD, BCPS [facility] main pharmacy [phone] 01/31/2023 9:31 AM

## 2023-02-01 DIAGNOSIS — K631 Perforation of intestine (nontraumatic): Secondary | ICD-10-CM | POA: Diagnosis not present

## 2023-02-01 LAB — CBC
HCT: 26.6 % — ABNORMAL LOW (ref 36.0–46.0)
Hemoglobin: 9 g/dL — ABNORMAL LOW (ref 12.0–15.0)
MCH: 30.7 pg (ref 26.0–34.0)
MCHC: 33.8 g/dL (ref 30.0–36.0)
MCV: 90.8 fL (ref 80.0–100.0)
Platelets: 642 10*3/uL — ABNORMAL HIGH (ref 150–400)
RBC: 2.93 MIL/uL — ABNORMAL LOW (ref 3.87–5.11)
RDW: 14.1 % (ref 11.5–15.5)
WBC: 12.7 10*3/uL — ABNORMAL HIGH (ref 4.0–10.5)
nRBC: 0 % (ref 0.0–0.2)

## 2023-02-01 LAB — BASIC METABOLIC PANEL
Anion gap: 9 (ref 5–15)
BUN: 13 mg/dL (ref 8–23)
CO2: 23 mmol/L (ref 22–32)
Calcium: 7.7 mg/dL — ABNORMAL LOW (ref 8.9–10.3)
Chloride: 98 mmol/L (ref 98–111)
Creatinine, Ser: 0.47 mg/dL (ref 0.44–1.00)
GFR, Estimated: >60 mL/min (ref >60)
Glucose, Bld: 151 mg/dL — ABNORMAL HIGH (ref 70–99)
Potassium: 3.8 mmol/L (ref 3.5–5.1)
Sodium: 130 mmol/L — ABNORMAL LOW (ref 135–145)

## 2023-02-01 LAB — MAGNESIUM: Magnesium: 1.9 mg/dL (ref 1.7–2.4)

## 2023-02-01 LAB — GLUCOSE, CAPILLARY
Glucose-Capillary: 140 mg/dL — ABNORMAL HIGH (ref 70–99)
Glucose-Capillary: 144 mg/dL — ABNORMAL HIGH (ref 70–99)
Glucose-Capillary: 145 mg/dL — ABNORMAL HIGH (ref 70–99)
Glucose-Capillary: 149 mg/dL — ABNORMAL HIGH (ref 70–99)

## 2023-02-01 LAB — PHOSPHORUS: Phosphorus: 3.4 mg/dL (ref 2.5–4.6)

## 2023-02-01 MED ORDER — POTASSIUM CHLORIDE CRYS ER 20 MEQ PO TBCR
40.0000 meq | EXTENDED_RELEASE_TABLET | Freq: Once | ORAL | Status: AC
  Administered 2023-02-01: 40 meq via ORAL
  Filled 2023-02-01: qty 2

## 2023-02-01 MED ORDER — BISACODYL 10 MG RE SUPP
10.0000 mg | Freq: Once | RECTAL | Status: AC
  Administered 2023-02-01: 10 mg via RECTAL
  Filled 2023-02-01: qty 1

## 2023-02-01 MED ORDER — POTASSIUM CHLORIDE 20 MEQ PO PACK: 40.0000 meq | PACK | Freq: Once | ORAL | Status: DC

## 2023-02-01 MED ORDER — FAT EMUL FISH OIL/PLANT BASED 20% (SMOFLIPID)IV EMUL
INTRAVENOUS | Status: AC
  Filled 2023-02-01: qty 873.6

## 2023-02-01 NOTE — Progress Notes (Signed)
Progress Note  5 Days Post-Op  Subjective: Continues to have incisional pain that she says is limiting her mobility. Remains bloated but tolerating CLD without nausea or emesis.   Objective: Vital signs in last 24 hours: Temp:  [98.7 F (37.1 C)-99.6 F (37.6 C)] 98.7 F (37.1 C) (01/19 0618) Pulse Rate:  [100-101] 100 (01/19 0618) Resp:  [14-16] 16 (01/19 0618) BP: (129-133)/(72-74) 133/72 (01/19 0618) SpO2:  [96 %-100 %] 96 % (01/19 0618) Last BM Date : 01/25/23  Intake/Output from previous day: 01/18 0701 - 01/19 0700 In: 2290 [P.O.:720; I.V.:1420; IV Piggyback:150] Out: 2095 [Urine:2050; Drains:45] Intake/Output this shift: No intake/output data recorded.  PE: General: pleasant, WD, WN female who is laying in bed in NAD Heart: regular, rate, and rhythm.   Lungs:  Respiratory effort nonlabored Abd: soft, appropriately ttp, honeycomb to RUQ incision, drain with SS fluid, mild distention Psych: A&Ox3 with an appropriate affect.    Lab Results:  Recent Labs    01/30/23 1327  WBC 13.4*  HGB 10.3*  HCT 30.9*  PLT 517*   BMET Recent Labs    01/31/23 0144 02/01/23 0311  NA 133* 130*  K 3.7 3.8  CL 100 98  CO2 28 23  GLUCOSE 150* 151*  BUN 11 13  CREATININE 0.51 0.47  CALCIUM 7.9* 7.7*   PT/INR No results for input(s): "LABPROT", "INR" in the last 72 hours. CMP     Component Value Date/Time   NA 130 (L) 02/01/2023 0311   K 3.8 02/01/2023 0311   CL 98 02/01/2023 0311   CO2 23 02/01/2023 0311   GLUCOSE 151 (H) 02/01/2023 0311   BUN 13 02/01/2023 0311   CREATININE 0.47 02/01/2023 0311   CREATININE 0.81 01/23/2015 1022   CALCIUM 7.7 (L) 02/01/2023 0311   PROT 5.4 (L) 01/29/2023 0305   ALBUMIN 2.0 (L) 01/29/2023 0305   AST 23 01/29/2023 0305   ALT 34 01/29/2023 0305   ALKPHOS 85 01/29/2023 0305   BILITOT 0.5 01/29/2023 0305   GFRNONAA >60 02/01/2023 0311   GFRAA  05/03/2008 0425    >60        The eGFR has been calculated using the MDRD  equation. This calculation has not been validated in all clinical situations. eGFR's persistently <60 mL/min signify possible Chronic Kidney Disease.   Lipase     Component Value Date/Time   LIPASE 10 (L) 01/26/2023 1214       Studies/Results: No results found.  Anti-infectives: Anti-infectives (From admission, onward)    Start     Dose/Rate Route Frequency Ordered Stop   01/26/23 2200  piperacillin-tazobactam (ZOSYN) IVPB 3.375 g        3.375 g 12.5 mL/hr over 240 Minutes Intravenous Every 8 hours 01/26/23 1532     01/26/23 1926  metroNIDAZOLE (FLAGYL) IVPB 500 mg  Status:  Discontinued        500 mg 100 mL/hr over 60 Minutes Intravenous Every 12 hours 01/26/23 1926 01/26/23 1934   01/26/23 1545  piperacillin-tazobactam (ZOSYN) IVPB 3.375 g        3.375 g 100 mL/hr over 30 Minutes Intravenous  Once 01/26/23 1531 01/26/23 1612        Assessment/Plan  POD5 s/p open cholecystectomy for perforated gallbladder and drainage of intraabdominal abscess Dr. Dwain Sarna - continue FLD.  Added suppository today - Zosyn stopped. Will follow up CBC and monitor for signs of abscess - continue TPN - mobilize more today - drain ss with 45 cc/24h - continue  for now   FEN: FLD, TPN VTE: LMWH ID: Zosyn (1/13-1/18)  Hx of esophageal cancer s/p resection  HTN HLD GERD Depression  LOS: 6 days     Moise Boring, MD [facility] Surgery 02/01/2023, 7:24 AM Please see Amion for pager number during day hours 7:00am-4:30pm

## 2023-02-01 NOTE — Progress Notes (Signed)
PROGRESS NOTE    Lauren [name]  ZOX:[id number] DOB: [birth year] DOA: 01/26/2023 PCP: Bradd Canary, MD    Brief Narrative:  68 yo female with past medical history of prediabetes,hyperlipidemia presented to hospital with abdominal pain nausea vomiting diarrhea, for 1 week initially started with nausea and vomiting. In the ED, vitals stable except for tachycardia. Labs with mild hyponatremia hypomagnesemia with magnesium level of 1.4. Elevated LFTs. HIV was nonreactive. Urinalysis was negative. CT abdomen done in the ED showed bowel perforation and walled off abscess as well as cholecystitis and gallbladder perforation. Lipase was 10. Narrowing of CBD-?malignancy unclear. General surgery was consulted and patient was admitted hospital for further evaluation and treatment  Underwent diagnostic laparoscopy with open cholecystectomy with drainage of intra-abdominal abscess by general surgery on 1/14.   Assessment & Plan:  Principal Problem:   Perforated bowel (HCC) Active Problems:   Perforated gallbladder     Acute cholecystitis with gallbladder perforation with possible walled off abscess Status post diagnostic laparoscopy with open cholecystectomy with drainage of abscess 1/14 Off Antibiotics.  Postop management by general surgery   Hyponatremia/Hypokalemia/hypophosphatemia/hypocalcemia Aggressive electrolyte repletion as needed.   Elevated LFTs. Stable  Hyperlipidemia/prediabetes - Diet controlled  Depression - Venlafaxine   DVT prophylaxis: Per primary team, general surgery   Code Status: Full code  Family Communication: None at bedside Disposition-postop management by general surgery  Subjective: Feel slightly better, no new complaints  Examination:  General exam: Appears calm and comfortable  Respiratory system: Clear to auscultation. Respiratory effort normal. Cardiovascular system: S1 & S2 heard, RRR. No JVD, murmurs, rubs, gallops or clicks. No pedal  edema. Gastrointestinal system: Abdomen is nondistended, soft and nontender. No organomegaly or masses felt.  No bowel sounds heard yet.  Central nervous system: Alert and oriented. No focal neurological deficits. Extremities: Symmetric 5 x 5 power. Skin: No rashes, lesions or ulcers Psychiatry: Judgement and insight appear normal. Mood & affect appropriate. RLE drain in place.  RUE PICC in place.                  Diet Orders (From admission, onward)     Start     Ordered   01/31/23 1123  Diet full liquid Room service appropriate? Yes; Fluid consistency: Thin  Diet effective now       Question Answer Comment  Room service appropriate? Yes   Fluid consistency: Thin      01/31/23 1123            Objective: Vitals:   01/31/23 0528 01/31/23 1435 01/31/23 2103 02/01/23 0618  BP: 137/77 132/73 129/74 133/72  Pulse: 94 (!) 101 (!) 101 100  Resp: 16 16 14 16   Temp: 98.5 F (36.9 C) 98.9 F (37.2 C) 99.6 F (37.6 C) 98.7 F (37.1 C)  TempSrc: Oral Oral Oral Oral  SpO2: 98% 100% 99% 96%  Weight:      Height:        Intake/Output Summary (Last 24 hours) at 02/01/2023 1133 Last data filed at 02/01/2023 0728 Gross per 24 hour  Intake 2410.03 ml  Output 2100 ml  Net 310.03 ml   Filed Weights   01/27/23 1143 01/29/23 0500 01/31/23 0500  Weight: 55.3 kg 60.8 kg 62.2 kg    Scheduled Meds:  Chlorhexidine Gluconate Cloth  6 each Topical Daily   enoxaparin (LOVENOX) injection  40 mg Subcutaneous Q24H   feeding supplement  1 Container Oral Q24H   insulin aspart  0-9 Units Subcutaneous Q8H  lidocaine  1 patch Transdermal Q24H   methocarbamol (ROBAXIN) injection  1,000 mg Intravenous Q8H   multivitamin with minerals  1 tablet Oral Daily   sodium chloride flush  10-40 mL Intracatheter Q12H   thiamine (VITAMIN B1) injection  100 mg Intravenous Daily   venlafaxine XR  225 mg Oral Q breakfast   Continuous Infusions:  TPN ADULT (ION) 70 mL/hr at 01/31/23 1736   TPN  ADULT (ION)      Nutritional status Signs/Symptoms: energy intake < or equal to 50% for > or equal to 5 days, moderate muscle depletion Interventions: Boost Breeze, TPN Body mass index is 23.54 kg/m.  Data Reviewed:   CBC: Recent Labs  Lab 01/26/23 1214 01/27/23 0308 01/28/23 0309 01/29/23 0305 01/30/23 1327 02/01/23 1042  WBC 7.7 15.3* 17.5* 15.4* 13.4* 12.7*  NEUTROABS 6.6  --   --   --   --   --   HGB 14.6 12.8 13.7 10.9* 10.3* 9.0*  HCT 43.4 38.4 40.6 32.1* 30.9* 26.6*  MCV 88.9 91.6 90.0 91.2 93.1 90.8  PLT 356 343 329 475* 517* 642*   Basic Metabolic Panel: Recent Labs  Lab 01/28/23 0309 01/29/23 0305 01/30/23 0248 01/31/23 0144 02/01/23 0311  NA 132* 126* 133* 133* 130*  K 4.1 3.3* 3.1* 3.7 3.8  CL 99 93* 98 100 98  CO2 21* 24 26 28 23   GLUCOSE 89 158* 159* 150* 151*  BUN 14 9 9 11 13   CREATININE 0.66 0.56 <0.30* 0.51 0.47  CALCIUM 8.0* 7.6* 7.7* 7.9* 7.7*  MG 2.4 2.0 2.1 1.9 1.9  PHOS  --  2.2* 2.4* 3.4 3.4   GFR: Estimated Creatinine Clearance: 57.3 mL/min (by C-G formula based on SCr of 0.47 mg/dL). Liver Function Tests: Recent Labs  Lab 01/26/23 1214 01/27/23 0308 01/28/23 0309 01/29/23 0305  AST 64* 32 22 23  ALT 101* 73* 43 34  ALKPHOS 132* 96 82 85  BILITOT 0.9 0.8 0.6 0.5  PROT 6.7 5.9* 5.4* 5.4*  ALBUMIN 3.5 2.5* 2.1* 2.0*   Recent Labs  Lab 01/26/23 1214  LIPASE 10*   No results for input(s): "AMMONIA" in the last 168 hours. Coagulation Profile: No results for input(s): "INR", "PROTIME" in the last 168 hours. Cardiac Enzymes: No results for input(s): "CKTOTAL", "CKMB", "CKMBINDEX", "TROPONINI" in the last 168 hours. BNP (last 3 results) No results for input(s): "PROBNP" in the last 8760 hours. HbA1C: No results for input(s): "HGBA1C" in the last 72 hours. CBG: Recent Labs  Lab 01/30/23 2347 01/31/23 0742 01/31/23 1602 02/01/23 0056 02/01/23 0725  GLUCAP 152* 164* 167* 144* 149*   Lipid Profile: No results for  input(s): "CHOL", "HDL", "LDLCALC", "TRIG", "CHOLHDL", "LDLDIRECT" in the last 72 hours. Thyroid Function Tests: No results for input(s): "TSH", "T4TOTAL", "FREET4", "T3FREE", "THYROIDAB" in the last 72 hours. Anemia Panel: No results for input(s): "VITAMINB12", "FOLATE", "FERRITIN", "TIBC", "IRON", "RETICCTPCT" in the last 72 hours. Sepsis Labs: No results for input(s): "PROCALCITON", "LATICACIDVEN" in the last 168 hours.  Recent Results (from the past 240 hours)  MRSA Next Gen by PCR, Nasal     Status: None   Collection Time: 01/27/23  3:10 AM   Specimen: Nasal Mucosa; Nasal Swab  Result Value Ref Range Status   MRSA by PCR Next Gen NOT DETECTED NOT DETECTED Final    Comment: (NOTE) The GeneXpert MRSA Assay (FDA approved for NASAL specimens only), is one component of a comprehensive MRSA colonization surveillance program. It is not intended to diagnose  MRSA infection nor to guide or monitor treatment for MRSA infections. Test performance is not FDA approved in patients less than 22 years old. Performed at [facility], [address], [place], [place] [address]          Radiology Studies: No results found.         LOS: 6 days   Time spent= 35 mins    Miguel Rota, MD [facility]  If 7PM-7AM, please contact night-coverage  02/01/2023, 11:33 AM

## 2023-02-01 NOTE — Progress Notes (Signed)
Mobility Specialist - Progress Note   02/01/23 0902  Mobility  Activity Ambulated with assistance in hallway  Level of Assistance Independent after set-up  Assistive Device Other (Comment) (IV Pole)  Distance Ambulated (ft) 500 ft  Activity Response Tolerated well  Mobility Referral Yes  Mobility visit 1 Mobility  Mobility Specialist Start Time (ACUTE ONLY) 0849  Mobility Specialist Stop Time (ACUTE ONLY) 0902  Mobility Specialist Time Calculation (min) (ACUTE ONLY) 13 min   Pt received in recliner and agreeable to mobility. Pt c/o soreness near incision. Pt to bed after session with all needs met.    [facility]

## 2023-02-01 NOTE — Progress Notes (Signed)
PHARMACY - TOTAL PARENTERAL NUTRITION CONSULT NOTE   Indication: Prolonged ileus  Patient Measurements: Height: 5\' 4"  (162.6 cm) Weight: 62.2 kg (137 lb 2 oz) IBW/kg (Calculated) : 54.7 TPN AdjBW (KG): 55.3 Body mass index is 23.54 kg/m.  Assessment: 68 year old female s/p open chole for perforated gallbladder and drainage of intra-abdominal abscess. Patient with poor oral intake prior to admission, approximately 10 days with very little intake. Expect prolonged ileus, patient also high risk for post op intra-abdominal abscess as well. Pharmacy to manage TPN.  Glucose / Insulin: no hx of DM - CBGs trending up with start of TPN, now controlled.  Range 144-167 - sSSI: 5 units / 24 hrs Electrolytes: Na low at 130, despite max Na concentration in TPN.  Others WNL including K, Mag, Phos, CCorrCa, Cl, CO2 WNL Renal: WNL Hepatic: LFTs WNL, albumin 2 Intake / Output; MIVF:  - Output: 2050 mL urine, 45 mL drains - Stool: (none) - Tolerating diet, no nausea but remains bloated.  CCS added suppository today  GI Imaging: 1/14 CT abd/pelv - multiple fluid collections w/ development of some organization in keeping with developing abscesses; findings likely related to a perforated acute cholecystitis GI Surgeries / Procedures:  1/14 open chole for perforated gallbladder and drainage of intra-abdominal abscess  Central access: PICC  TPN start date: 1/15  Nutritional Goals: Goal TPN rate is 70 mL/hr (provides 87 g of protein and 1710 kcals per day)  RD Assessment: Estimated Needs Total Energy Estimated Needs: 1650-1850 Total Protein Estimated Needs: 80-90 g Total Fluid Estimated Needs: 1.65-1.85 L  Current Nutrition: FLD.  TPN  Plan:  Now: KCl 40 mEq PO x1  At 18:00 - Continue TPN at goal rate, 70 mL/hr Electrolytes in TPN: Na 154 mEq/L (max), K 50 mEq/L, Ca 5 mEq/L, Mg 5 mEq/L, and Phos 15 mmol/L. Cl:Ac 1:1  - Continue oral MVI/trace elements -Thiamine 100 mg IV daily x 5 days for  refeeding risk (1/15-1/19) - mIVF d/c -Continue Sensitive q8h SSI and adjust as needed -Monitor TPN labs on Mon/Thurs, and prn.   Lynann Beaver PharmD, BCPS [facility] main pharmacy [phone] 02/01/2023 8:26 AM

## 2023-02-02 ENCOUNTER — Inpatient Hospital Stay (HOSPITAL_COMMUNITY): Payer: Medicare HMO

## 2023-02-02 DIAGNOSIS — M7989 Other specified soft tissue disorders: Secondary | ICD-10-CM

## 2023-02-02 DIAGNOSIS — K631 Perforation of intestine (nontraumatic): Secondary | ICD-10-CM | POA: Diagnosis not present

## 2023-02-02 LAB — COMPREHENSIVE METABOLIC PANEL
ALT: 46 U/L — ABNORMAL HIGH (ref 0–44)
AST: 43 U/L — ABNORMAL HIGH (ref 15–41)
Albumin: 1.8 g/dL — ABNORMAL LOW (ref 3.5–5.0)
Alkaline Phosphatase: 176 U/L — ABNORMAL HIGH (ref 38–126)
Anion gap: 7 (ref 5–15)
BUN: 13 mg/dL (ref 8–23)
CO2: 26 mmol/L (ref 22–32)
Calcium: 8 mg/dL — ABNORMAL LOW (ref 8.9–10.3)
Chloride: 98 mmol/L (ref 98–111)
Creatinine, Ser: 0.41 mg/dL — ABNORMAL LOW (ref 0.44–1.00)
GFR, Estimated: >60 mL/min (ref >60)
Glucose, Bld: 137 mg/dL — ABNORMAL HIGH (ref 70–99)
Potassium: 4.3 mmol/L (ref 3.5–5.1)
Sodium: 131 mmol/L — ABNORMAL LOW (ref 135–145)
Total Bilirubin: 0.2 mg/dL (ref 0.0–1.2)
Total Protein: 5.4 g/dL — ABNORMAL LOW (ref 6.5–8.1)

## 2023-02-02 LAB — MAGNESIUM: Magnesium: 2.1 mg/dL (ref 1.7–2.4)

## 2023-02-02 LAB — CBC
HCT: 25.7 % — ABNORMAL LOW (ref 36.0–46.0)
Hemoglobin: 8.5 g/dL — ABNORMAL LOW (ref 12.0–15.0)
MCH: 30.6 pg (ref 26.0–34.0)
MCHC: 33.1 g/dL (ref 30.0–36.0)
MCV: 92.4 fL (ref 80.0–100.0)
Platelets: 607 10*3/uL — ABNORMAL HIGH (ref 150–400)
RBC: 2.78 MIL/uL — ABNORMAL LOW (ref 3.87–5.11)
RDW: 14.2 % (ref 11.5–15.5)
WBC: 10.9 10*3/uL — ABNORMAL HIGH (ref 4.0–10.5)
nRBC: 0 % (ref 0.0–0.2)

## 2023-02-02 LAB — TRIGLYCERIDES: Triglycerides: 274 mg/dL — ABNORMAL HIGH (ref <150)

## 2023-02-02 LAB — GLUCOSE, CAPILLARY: Glucose-Capillary: 135 mg/dL — ABNORMAL HIGH (ref 70–99)

## 2023-02-02 LAB — PHOSPHORUS: Phosphorus: 3.9 mg/dL (ref 2.5–4.6)

## 2023-02-02 MED ORDER — ACETAMINOPHEN 500 MG PO TABS
1000.0000 mg | ORAL_TABLET | Freq: Four times a day (QID) | ORAL | Status: DC
  Administered 2023-02-02 – 2023-02-05 (×11): 1000 mg via ORAL
  Filled 2023-02-02 (×12): qty 2

## 2023-02-02 MED ORDER — METHOCARBAMOL 500 MG PO TABS
1000.0000 mg | ORAL_TABLET | Freq: Three times a day (TID) | ORAL | Status: DC
Start: 2023-02-02 — End: 2023-02-04
  Administered 2023-02-02 – 2023-02-04 (×6): 1000 mg via ORAL
  Filled 2023-02-02 (×6): qty 2

## 2023-02-02 MED ORDER — THIAMINE MONONITRATE 100 MG PO TABS
100.0000 mg | ORAL_TABLET | Freq: Every day | ORAL | Status: AC
Start: 2023-02-02 — End: 2023-02-02
  Administered 2023-02-02: 100 mg via ORAL
  Filled 2023-02-02: qty 1

## 2023-02-02 MED ORDER — TRAVASOL 10 % IV SOLN
INTRAVENOUS | Status: AC
  Filled 2023-02-02: qty 873.6

## 2023-02-02 MED ORDER — SENNA 8.6 MG PO TABS
1.0000 | ORAL_TABLET | Freq: Every day | ORAL | Status: DC
  Administered 2023-02-02 – 2023-02-04 (×3): 8.6 mg via ORAL
  Filled 2023-02-02 (×3): qty 1

## 2023-02-02 MED ORDER — BISACODYL 10 MG RE SUPP
10.0000 mg | Freq: Once | RECTAL | Status: AC
Start: 2023-02-02 — End: 2023-02-02
  Administered 2023-02-02: 10 mg via RECTAL
  Filled 2023-02-02: qty 1

## 2023-02-02 MED ORDER — DOCUSATE SODIUM 100 MG PO CAPS
100.0000 mg | ORAL_CAPSULE | Freq: Two times a day (BID) | ORAL | Status: DC
  Administered 2023-02-02 – 2023-02-05 (×7): 100 mg via ORAL
  Filled 2023-02-02 (×7): qty 1

## 2023-02-02 NOTE — Progress Notes (Signed)
PICC line care: R forearm edematous, pt reports this began 2-3 days ago. She stated she's been keeping her arm straight so the pump doesn't beep. Informed her the catheter is not in the bend of her arm, any "kinking" of the line is likely in the IV tubing, demonstrated how to check lines are straight. Encouraged pt to use R arm as she normally would. Elevated forearm to see if this relieves any swelling. Discussed with Rhoda, RN. If swelling does not improve w arm exercises/ elevation, may need doppler to determine cause.

## 2023-02-02 NOTE — Progress Notes (Signed)
Physical Therapy Treatment Patient Details Name: Lauren [name] MRN: [id number] DOB: [birth year] Today's Date: 02/02/2023   History of Present Illness Pt is 68 yo female admitted on 01/26/23 with perforated gallbladder and is s/p open chole on 01/27/23. Pt with hx including but not limited to prediabetic, HLD, open hysterectomy    PT Comments  Pt ambulated 400' without an assistive device, no loss of balance.  PT goals have been met, PT signing off. Encouraged pt to walk in halls at least TID to minimize deconditioning during hospitalization.    If plan is discharge home, recommend the following: Assistance with cooking/housework;Help with stairs or ramp for entrance;A little help with bathing/dressing/bathroom   Can travel by private vehicle        Equipment Recommendations  None recommended by PT    Recommendations for Other Services       Precautions / Restrictions Precautions Precautions: Fall Precaution Comments: JP drain Restrictions Weight Bearing Restrictions Per Provider Order: No     Mobility  Bed Mobility   Bed Mobility: Rolling, Sidelying to Sit Rolling: Modified independent (Device/Increase time) Sidelying to sit: Modified independent (Device/Increase time)            Transfers Overall transfer level: Modified independent Equipment used: None Transfers: Sit to/from Stand Sit to Stand: Independent                Ambulation/Gait Ambulation/Gait assistance: Independent Gait Distance (Feet): 400 Feet Assistive device: None Gait Pattern/deviations: WFL(Within Functional Limits) Gait velocity: WFL     General Gait Details: no loss of balance   Stairs             Wheelchair Mobility     Tilt Bed    Modified Rankin (Stroke Patients Only)       Balance     Sitting balance-Leahy Scale: Good       Standing balance-Leahy Scale: Good                              Cognition Arousal: Alert Behavior During Therapy:  WFL for tasks assessed/performed Overall Cognitive Status: Within Functional Limits for tasks assessed                                          Exercises      General Comments        Pertinent Vitals/Pain Pain Assessment Pain Score: 8  Pain Location: abdomen and low back with walking Pain Descriptors / Indicators: Discomfort Pain Intervention(s): Limited activity within patient's tolerance, Monitored during session, Premedicated before session, Repositioned; heat applied to back    Home Living                          Prior Function            PT Goals (current goals can now be found in the care plan section) Acute Rehab PT Goals Patient Stated Goal: return home; have a birthday part PT Goal Formulation: With patient Time For Goal Achievement: 02/11/23 Potential to Achieve Goals: Good Progress towards PT goals: Goals met/education completed, patient discharged from PT    Frequency    Min 1X/week      PT Plan      Co-evaluation  AM-PAC PT "6 Clicks" Mobility   Outcome Measure  Help needed turning from your back to your side while in a flat bed without using bedrails?: None Help needed moving from lying on your back to sitting on the side of a flat bed without using bedrails?: None Help needed moving to and from a bed to a chair (including a wheelchair)?: None Help needed standing up from a chair using your arms (e.g., wheelchair or bedside chair)?: None Help needed to walk in hospital room?: None Help needed climbing 3-5 steps with a railing? : None 6 Click Score: 24    End of Session   Activity Tolerance: Patient tolerated treatment well Patient left: in chair;with call bell/phone within reach Nurse Communication: Mobility status PT Visit Diagnosis: Other abnormalities of gait and mobility (R26.89);Pain     Time: 2956-2130 PT Time Calculation (min) (ACUTE ONLY): 13 min  Charges:    $Gait Training: 8-22  mins PT General Charges $$ ACUTE PT VISIT: 1 Visit                     Tamala Ser PT 02/02/2023  Acute Rehabilitation Services  Office [phone]

## 2023-02-02 NOTE — Progress Notes (Signed)
Progress Note  6 Days Post-Op  Subjective: Ambulated 3 times yesterday. Continues to have incisional pain. On FLD but is mostly drinking clears. No n/v. Denies flatus or BM  Objective: Vital signs in last 24 hours: Temp:  [98.3 F (36.8 C)-99.4 F (37.4 C)] 98.3 F (36.8 C) (01/20 0537) Pulse Rate:  [89-100] 100 (01/20 0537) Resp:  [14-18] 14 (01/20 0537) BP: (129-170)/(67-85) 132/67 (01/20 0537) SpO2:  [95 %-96 %] 95 % (01/20 0537) Weight:  [62.5 kg] 62.5 kg (01/20 0500) Last BM Date : 01/25/23  Intake/Output from previous day: 01/19 0701 - 01/20 0700 In: 360 [P.O.:360] Out: 1408 [Urine:1400; Drains:8] Intake/Output this shift: No intake/output data recorded.  PE: General: pleasant, WD, WN female who is laying in bed in NAD Heart: regular, rate, and rhythm.   Lungs:  Respiratory effort nonlabored Abd: soft, appropriately ttp, honeycomb to RUQ incision, drain with Serous fluid, mild distention MSK: pitting edema RUE without erythema or heat. No edam LUE Bilateral symmetric LE edema, mild, without pain or erythema Psych: A&Ox3 with an appropriate affect.    Lab Results:  Recent Labs    02/01/23 1042 02/02/23 0328  WBC 12.7* 10.9*  HGB 9.0* 8.5*  HCT 26.6* 25.7*  PLT 642* 607*   BMET Recent Labs    02/01/23 0311 02/02/23 0328  NA 130* 131*  K 3.8 4.3  CL 98 98  CO2 23 26  GLUCOSE 151* 137*  BUN 13 13  CREATININE 0.47 0.41*  CALCIUM 7.7* 8.0*   PT/INR No results for input(s): "LABPROT", "INR" in the last 72 hours. CMP     Component Value Date/Time   NA 131 (L) 02/02/2023 0328   K 4.3 02/02/2023 0328   CL 98 02/02/2023 0328   CO2 26 02/02/2023 0328   GLUCOSE 137 (H) 02/02/2023 0328   BUN 13 02/02/2023 0328   CREATININE 0.41 (L) 02/02/2023 0328   CREATININE 0.81 01/23/2015 1022   CALCIUM 8.0 (L) 02/02/2023 0328   PROT 5.4 (L) 02/02/2023 0328   ALBUMIN 1.8 (L) 02/02/2023 0328   AST 43 (H) 02/02/2023 0328   ALT 46 (H) 02/02/2023 0328    ALKPHOS 176 (H) 02/02/2023 0328   BILITOT 0.2 02/02/2023 0328   GFRNONAA >60 02/02/2023 0328   GFRAA  05/03/2008 0425    >60        The eGFR has been calculated using the MDRD equation. This calculation has not been validated in all clinical situations. eGFR's persistently <60 mL/min signify possible Chronic Kidney Disease.   Lipase     Component Value Date/Time   LIPASE 10 (L) 01/26/2023 1214       Studies/Results: No results found.  Anti-infectives: Anti-infectives (From admission, onward)    Start     Dose/Rate Route Frequency Ordered Stop   01/26/23 2200  piperacillin-tazobactam (ZOSYN) IVPB 3.375 g  Status:  Discontinued        3.375 g 12.5 mL/hr over 240 Minutes Intravenous Every 8 hours 01/26/23 1532 02/01/23 0758   01/26/23 1926  metroNIDAZOLE (FLAGYL) IVPB 500 mg  Status:  Discontinued        500 mg 100 mL/hr over 60 Minutes Intravenous Every 12 hours 01/26/23 1926 01/26/23 1934   01/26/23 1545  piperacillin-tazobactam (ZOSYN) IVPB 3.375 g        3.375 g 100 mL/hr over 30 Minutes Intravenous  Once 01/26/23 1531 01/26/23 1612        Assessment/Plan  POD6 s/p open cholecystectomy for perforated gallbladder and drainage of  intraabdominal abscess Dr. Dwain Sarna - continue FLD.  Suppository today. Added colace and senna - pain regimen adjusted - Zosyn stopped 1/18. Will follow up CBC and monitor for signs of abscess - stable at 10.9 - continue TPN - mobilize - drain ss with 20 cc/24h - continue for now   RUE swelling - on laterality of PICC. Will get [place] B/L LE swelling - symmetric and no pain and favor fluid over thromboembolism. Will hold off on [place] for now. Recc ambulation and elevation  FEN: FLD, TPN VTE: LMWH ID: Zosyn (1/13-1/18)  Hx of esophageal cancer s/p resection  HTN HLD GERD Depression  LOS: 7 days     Eric Form, [facility] Surgery 02/02/2023, 8:20 AM Please see Amion for pager number during day hours  7:00am-4:30pm

## 2023-02-02 NOTE — Progress Notes (Signed)
Pt stated, she had not had bowel movement for 2 weeks now. Does not pass flatus. Pt keep refusing suppository as ordered. Educated pt the importance of moving bowels.  Pt is ambulating well.

## 2023-02-02 NOTE — Progress Notes (Addendum)
Upper extremity venous duplex completed. Please see CV Procedures for preliminary results.  Shona Simpson, RVT 02/02/23 11:59 AM

## 2023-02-02 NOTE — Progress Notes (Signed)
PHARMACY - TOTAL PARENTERAL NUTRITION CONSULT NOTE   Indication: Prolonged ileus  Patient Measurements: Height: 5\' 4"  (162.6 cm) Weight: 62.5 kg (137 lb 12.6 oz) IBW/kg (Calculated) : 54.7 TPN AdjBW (KG): 55.3 Body mass index is 23.65 kg/m.  Assessment: 68 year old female s/p open chole for perforated gallbladder and drainage of intra-abdominal abscess. Patient with poor oral intake prior to admission, approximately 10 days with very little intake. Expect prolonged ileus, patient also high risk for post op intra-abdominal abscess as well. Pharmacy to manage TPN.  Glucose / Insulin: no hx of DM - CBGs 135 - 149 - all < 180  - sSSI: 2 units / 24 hrs> will DC SSI/CBGs Electrolytes: Na low at 131, despite max Na concentration in TPN.  Others WNL including K, Mag, Phos, CCorrCa, Cl, CO2 WNL Renal: WNL Hepatic: LFTs slightly elevated, albumin 1.8 Trig 1/20 = 274 Intake / Output; MIVF:  - Output: 1400 mL urine, 8 mL drains - Stool: (none) - Tolerating FLD diet, no nausea but remains bloated.  suppository x 1 given 1/19 GI Imaging: 1/14 CT abd/pelv - multiple fluid collections w/ development of some organization in keeping with developing abscesses; findings likely related to a perforated acute cholecystitis GI Surgeries / Procedures:  1/14 open chole for perforated gallbladder and drainage of intra-abdominal abscess  Central access: PICC  TPN start date: 1/15  Nutritional Goals: Goal TPN rate is 70 mL/hr (provides 87 g of protein and 1710 kcals per day)  RD Assessment: Estimated Needs Total Energy Estimated Needs: 1650-1850 Total Protein Estimated Needs: 80-90 g Total Fluid Estimated Needs: 1.65-1.85 L  Current Nutrition: FLD.  TPN  Plan:  At 18:00 - Continue TPN at goal rate, 70 mL/hr Electrolytes in TPN: Na 154 mEq/L (max), K 50 mEq/L, Ca 5 mEq/L, Mg 5 mEq/L, and Phos 15 mmol/L. Cl:Ac 1:1  - Continue oral MVI/trace elements -Thiamine x 5 days for refeeding risk completed   DC SSI & CBGs - no mIVF  -Monitor TPN labs on Mon/Thurs, and prn.   Herby Abraham, Pharm.D Use secure chat for questions 02/02/2023 8:05 AM

## 2023-02-02 NOTE — Progress Notes (Signed)
PT Cancellation Note  Patient Details Name: JANESHA DADDIO MRN: [id number] DOB: [birth year]   Cancelled Treatment:    Reason Eval/Treat Not Completed: Patient at procedure or test/unavailable (dopplers being performed, will check back later.)   Tamala Ser PT 02/02/2023  Acute Rehabilitation Services  Office [phone]

## 2023-02-02 NOTE — Progress Notes (Signed)
PROGRESS NOTE    Lauren [name]  QVZ:[id number] DOB: 01-Oct-1955 DOA: 01/26/2023 PCP: Bradd Canary, MD    Brief Narrative:  68 yo female with past medical history of prediabetes,hyperlipidemia presented to hospital with abdominal pain nausea vomiting diarrhea, for 1 week initially started with nausea and vomiting. In the ED, vitals stable except for tachycardia. Labs with mild hyponatremia hypomagnesemia with magnesium level of 1.4. Elevated LFTs. HIV was nonreactive. Urinalysis was negative. CT abdomen done in the ED showed bowel perforation and walled off abscess as well as cholecystitis and gallbladder perforation. Lipase was 10. Narrowing of CBD-?malignancy unclear. General surgery was consulted and patient was admitted hospital for further evaluation and treatment  Underwent diagnostic laparoscopy with open cholecystectomy with drainage of intra-abdominal abscess by general surgery on 1/14. Postoperatively patient has been very slow to improve.  Assessment & Plan:  Principal Problem:   Perforated bowel (HCC) Active Problems:   Perforated gallbladder     Acute cholecystitis with gallbladder perforation with possible walled off abscess Status post diagnostic laparoscopy with open cholecystectomy with drainage of abscess 1/14 Off Antibiotics.  Postop management by general surgery   Hyponatremia/Hypokalemia/hypophosphatemia/hypocalcemia Aggressive electrolyte repletion as needed.   Elevated LFTs. Stable  Hyperlipidemia/prediabetes - Diet controlled  Depression - Venlafaxine   DVT prophylaxis: Per primary team, general surgery   Code Status: Full code  Family Communication: None at bedside Disposition-postop management by general surgery  Subjective: Some leg swelling from fluid.  Ambulating in the hallway  Examination:  General exam: Appears calm and comfortable  Respiratory system: Clear to auscultation. Respiratory effort normal. Cardiovascular system: S1 & S2  heard, RRR. No JVD, murmurs, rubs, gallops or clicks. trace pedal edema. Gastrointestinal system: Abdomen is nondistended, soft and nontender. No organomegaly or masses felt.  No bowel sounds heard yet.  Central nervous system: Alert and oriented. No focal neurological deficits. Extremities: Symmetric 5 x 5 power. Skin: No rashes, lesions or ulcers Psychiatry: Judgement and insight appear normal. Mood & affect appropriate. RLE drain in place.  RUE PICC in place.                  Diet Orders (From admission, onward)     Start     Ordered   01/31/23 1123  Diet full liquid Room service appropriate? Yes; Fluid consistency: Thin  Diet effective now       Question Answer Comment  Room service appropriate? Yes   Fluid consistency: Thin      01/31/23 1123            Objective: Vitals:   02/01/23 2134 02/01/23 2336 02/02/23 0500 02/02/23 0537  BP: (!) 170/85 (!) 158/84  132/67  Pulse: 100 89  100  Resp: 18 18  14   Temp: 98.8 F (37.1 C) 98.5 F (36.9 C)  98.3 F (36.8 C)  TempSrc: Oral Oral  Oral  SpO2: 96% 96%  95%  Weight:   62.5 kg   Height:        Intake/Output Summary (Last 24 hours) at 02/02/2023 1203 Last data filed at 02/02/2023 1000 Gross per 24 hour  Intake 480 ml  Output 923 ml  Net -443 ml   Filed Weights   01/29/23 0500 01/31/23 0500 02/02/23 0500  Weight: 60.8 kg 62.2 kg 62.5 kg    Scheduled Meds:  acetaminophen  1,000 mg Oral Q6H   Chlorhexidine Gluconate Cloth  6 each Topical Daily   docusate sodium  100 mg Oral BID   enoxaparin (  LOVENOX) injection  40 mg Subcutaneous Q24H   feeding supplement  1 Container Oral Q24H   methocarbamol (ROBAXIN) injection  1,000 mg Intravenous Q8H   multivitamin with minerals  1 tablet Oral Daily   senna  1 tablet Oral QHS   sodium chloride flush  10-40 mL Intracatheter Q12H   venlafaxine XR  225 mg Oral Q breakfast   Continuous Infusions:  TPN ADULT (ION) 70 mL/hr at 02/01/23 1738   TPN ADULT (ION)       Nutritional status Signs/Symptoms: energy intake < or equal to 50% for > or equal to 5 days, moderate muscle depletion Interventions: Boost Breeze, TPN Body mass index is 23.65 kg/m.  Data Reviewed:   CBC: Recent Labs  Lab 01/26/23 1214 01/27/23 0308 01/28/23 0309 01/29/23 0305 01/30/23 1327 02/01/23 1042 02/02/23 0328  WBC 7.7   < > 17.5* 15.4* 13.4* 12.7* 10.9*  NEUTROABS 6.6  --   --   --   --   --   --   HGB 14.6   < > 13.7 10.9* 10.3* 9.0* 8.5*  HCT 43.4   < > 40.6 32.1* 30.9* 26.6* 25.7*  MCV 88.9   < > 90.0 91.2 93.1 90.8 92.4  PLT 356   < > 329 475* 517* 642* 607*   < > = values in this interval not displayed.   Basic Metabolic Panel: Recent Labs  Lab 01/29/23 0305 01/30/23 0248 01/31/23 0144 02/01/23 0311 02/02/23 0328  NA 126* 133* 133* 130* 131*  K 3.3* 3.1* 3.7 3.8 4.3  CL 93* 98 100 98 98  CO2 24 26 28 23 26   GLUCOSE 158* 159* 150* 151* 137*  BUN 9 9 11 13 13   CREATININE 0.56 <0.30* 0.51 0.47 0.41*  CALCIUM 7.6* 7.7* 7.9* 7.7* 8.0*  MG 2.0 2.1 1.9 1.9 2.1  PHOS 2.2* 2.4* 3.4 3.4 3.9   GFR: Estimated Creatinine Clearance: 57.3 mL/min (A) (by C-G formula based on SCr of 0.41 mg/dL (L)). Liver Function Tests: Recent Labs  Lab 01/26/23 1214 01/27/23 0308 01/28/23 0309 01/29/23 0305 02/02/23 0328  AST 64* 32 22 23 43*  ALT 101* 73* 43 34 46*  ALKPHOS 132* 96 82 85 176*  BILITOT 0.9 0.8 0.6 0.5 0.2  PROT 6.7 5.9* 5.4* 5.4* 5.4*  ALBUMIN 3.5 2.5* 2.1* 2.0* 1.8*   Recent Labs  Lab 01/26/23 1214  LIPASE 10*   No results for input(s): "AMMONIA" in the last 168 hours. Coagulation Profile: No results for input(s): "INR", "PROTIME" in the last 168 hours. Cardiac Enzymes: No results for input(s): "CKTOTAL", "CKMB", "CKMBINDEX", "TROPONINI" in the last 168 hours. BNP (last 3 results) No results for input(s): "PROBNP" in the last 8760 hours. HbA1C: No results for input(s): "HGBA1C" in the last 72 hours. CBG: Recent Labs  Lab  02/01/23 0056 02/01/23 0725 02/01/23 1559 02/01/23 2341 02/02/23 0749  GLUCAP 144* 149* 145* 140* 135*   Lipid Profile: Recent Labs    02/02/23 0328  TRIG 274*   Thyroid Function Tests: No results for input(s): "TSH", "T4TOTAL", "FREET4", "T3FREE", "THYROIDAB" in the last 72 hours. Anemia Panel: No results for input(s): "VITAMINB12", "FOLATE", "FERRITIN", "TIBC", "IRON", "RETICCTPCT" in the last 72 hours. Sepsis Labs: No results for input(s): "PROCALCITON", "LATICACIDVEN" in the last 168 hours.  Recent Results (from the past 240 hours)  MRSA Next Gen by PCR, Nasal     Status: None   Collection Time: 01/27/23  3:10 AM   Specimen: Nasal Mucosa; Nasal Swab  Result Value Ref Range Status   MRSA by PCR Next Gen NOT DETECTED NOT DETECTED Final    Comment: (NOTE) The GeneXpert MRSA Assay (FDA approved for NASAL specimens only), is one component of a comprehensive MRSA colonization surveillance program. It is not intended to diagnose MRSA infection nor to guide or monitor treatment for MRSA infections. Test performance is not FDA approved in patients less than 27 years old. Performed at [facility], [address] [place], [place] [address]          Radiology Studies: No results found.         LOS: 7 days   Time spent= 35 mins    Miguel Rota, MD [facility]  If 7PM-7AM, please contact night-coverage  02/02/2023, 12:03 PM

## 2023-02-03 ENCOUNTER — Inpatient Hospital Stay (HOSPITAL_COMMUNITY): Payer: Medicare HMO

## 2023-02-03 DIAGNOSIS — K631 Perforation of intestine (nontraumatic): Secondary | ICD-10-CM | POA: Diagnosis not present

## 2023-02-03 LAB — CBC
HCT: 25.5 % — ABNORMAL LOW (ref 36.0–46.0)
Hemoglobin: 8.6 g/dL — ABNORMAL LOW (ref 12.0–15.0)
MCH: 30.3 pg (ref 26.0–34.0)
MCHC: 33.7 g/dL (ref 30.0–36.0)
MCV: 89.8 fL (ref 80.0–100.0)
Platelets: 717 10*3/uL — ABNORMAL HIGH (ref 150–400)
RBC: 2.84 MIL/uL — ABNORMAL LOW (ref 3.87–5.11)
RDW: 13.9 % (ref 11.5–15.5)
WBC: 9 10*3/uL (ref 4.0–10.5)
nRBC: 0 % (ref 0.0–0.2)

## 2023-02-03 MED ORDER — TRAVASOL 10 % IV SOLN
INTRAVENOUS | Status: AC
  Filled 2023-02-03: qty 873.6

## 2023-02-03 MED ORDER — IOHEXOL 300 MG/ML  SOLN
100.0000 mL | Freq: Once | INTRAMUSCULAR | Status: AC | PRN
  Administered 2023-02-03: 100 mL via INTRAVENOUS

## 2023-02-03 NOTE — Progress Notes (Signed)
Progress Note  7 Days Post-Op  Subjective: Tolerating liquids and denies nausea or vomiting. Not really passing flatus and no BM. Pain control has been difficult.   Objective: Vital signs in last 24 hours: Temp:  [98.6 F (37 C)-99 F (37.2 C)] 98.6 F (37 C) (01/21 1610) Pulse Rate:  [91-97] 91 (01/21 0613) Resp:  [16-18] 16 (01/21 0613) BP: (130-141)/(65-76) 141/65 (01/21 0613) SpO2:  [94 %-96 %] 95 % (01/21 9604) Weight:  [64.3 kg] 64.3 kg (01/21 0334) Last BM Date : 01/25/23  Intake/Output from previous day: 01/20 0701 - 01/21 0700 In: 600 [P.O.:600] Out: 31 [Urine:1; Drains:30] Intake/Output this shift: Total I/O In: 130 [P.O.:120; I.V.:10] Out: 0   PE: General: pleasant, WD, WN female who is laying in bed in NAD Heart: regular, rate, and rhythm.   Lungs:  Respiratory effort nonlabored Abd: soft, appropriately ttp, incisions C/D/I with staples present, drain with Serous fluid, mild distention Psych: A&Ox3 with an appropriate affect.    Lab Results:  Recent Labs    02/02/23 0328 02/03/23 0500  WBC 10.9* 9.0  HGB 8.5* 8.6*  HCT 25.7* 25.5*  PLT 607* 717*   BMET Recent Labs    02/01/23 0311 02/02/23 0328  NA 130* 131*  K 3.8 4.3  CL 98 98  CO2 23 26  GLUCOSE 151* 137*  BUN 13 13  CREATININE 0.47 0.41*  CALCIUM 7.7* 8.0*   PT/INR No results for input(s): "LABPROT", "INR" in the last 72 hours. CMP     Component Value Date/Time   NA 131 (L) 02/02/2023 0328   K 4.3 02/02/2023 0328   CL 98 02/02/2023 0328   CO2 26 02/02/2023 0328   GLUCOSE 137 (H) 02/02/2023 0328   BUN 13 02/02/2023 0328   CREATININE 0.41 (L) 02/02/2023 0328   CREATININE 0.81 01/23/2015 1022   CALCIUM 8.0 (L) 02/02/2023 0328   PROT 5.4 (L) 02/02/2023 0328   ALBUMIN 1.8 (L) 02/02/2023 0328   AST 43 (H) 02/02/2023 0328   ALT 46 (H) 02/02/2023 0328   ALKPHOS 176 (H) 02/02/2023 0328   BILITOT 0.2 02/02/2023 0328   GFRNONAA >60 02/02/2023 0328   GFRAA  05/03/2008 0425     >60        The eGFR has been calculated using the MDRD equation. This calculation has not been validated in all clinical situations. eGFR's persistently <60 mL/min signify possible Chronic Kidney Disease.   Lipase     Component Value Date/Time   LIPASE 10 (L) 01/26/2023 1214       Studies/Results: VAS [place] UPPER EXTREMITY VENOUS DUPLEX Result Date: 02/02/2023 UPPER VENOUS STUDY  Patient Name:  Lauren [name] [facility]  Date of Exam:   02/02/2023 Medical Rec #: [id number]         Accession #:    [id number] Date of Birth: [birth year]         Patient Gender: F Patient Age:   68 years Exam Location:  [facility] Procedure:      VAS [place] UPPER EXTREMITY VENOUS DUPLEX Referring Phys: Johnny Bridge [facility] --------------------------------------------------------------------------------  Indications: Swelling Risk Factors: None identified. Anticoagulation: Lovenox. Limitations: Bandages. Comparison Study: None. Performing Technologist: Shona Simpson  Examination Guidelines: A complete evaluation includes B-mode imaging, spectral Doppler, color Doppler, and power Doppler as needed of all accessible portions of each vessel. Bilateral testing is considered an integral part of a complete examination. Limited examinations for reoccurring indications may be performed as noted.  Right Findings: +----------+------------+---------+-----------+------------------+-------+ RIGHT  CompressiblePhasicitySpontaneous    Properties    Summary +----------+------------+---------+-----------+------------------+-------+ IJV           Full       Yes       Yes                              +----------+------------+---------+-----------+------------------+-------+ Subclavian    Full       Yes       Yes                              +----------+------------+---------+-----------+------------------+-------+ Axillary      Full       Yes       Yes                               +----------+------------+---------+-----------+------------------+-------+ Brachial      Full       Yes       Yes                              +----------+------------+---------+-----------+------------------+-------+ Radial        Full       Yes       Yes                              +----------+------------+---------+-----------+------------------+-------+ Ulnar         Full       Yes       Yes                              +----------+------------+---------+-----------+------------------+-------+ Cephalic    Partial                Yes    brightly echogenic Acute  +----------+------------+---------+-----------+------------------+-------+ Basilic       Full                 Yes                              +----------+------------+---------+-----------+------------------+-------+ Acute Partial thrombosis seen in the Cephalic vein from wrist to ACF.  Left Findings: +----------+------------+---------+-----------+----------+-------+ LEFT      CompressiblePhasicitySpontaneousPropertiesSummary +----------+------------+---------+-----------+----------+-------+ Subclavian    Full       Yes       Yes                      +----------+------------+---------+-----------+----------+-------+  Summary:  Right: No evidence of deep vein thrombosis in the upper extremity. Findings consistent with acute superficial vein thrombosis involving the right cephalic vein. Vessels containing PICC line show no obstructions.  Left: No evidence of thrombosis in the subclavian.  *See table(s) above for measurements and observations.  Diagnosing physician: Carolynn Sayers Electronically signed by Carolynn Sayers on 02/02/2023 at 2:05:55 PM.    Final     Anti-infectives: Anti-infectives (From admission, onward)    Start     Dose/Rate Route Frequency Ordered Stop   01/26/23 2200  piperacillin-tazobactam (ZOSYN) IVPB 3.375 g  Status:  Discontinued        3.375 g 12.5 mL/hr over 240 Minutes  Intravenous Every 8 hours 01/26/23 1532  02/01/23 0758   01/26/23 1926  metroNIDAZOLE (FLAGYL) IVPB 500 mg  Status:  Discontinued        500 mg 100 mL/hr over 60 Minutes Intravenous Every 12 hours 01/26/23 1926 01/26/23 1934   01/26/23 1545  piperacillin-tazobactam (ZOSYN) IVPB 3.375 g        3.375 g 100 mL/hr over 30 Minutes Intravenous  Once 01/26/23 1531 01/26/23 1612        Assessment/Plan  POD7 s/p open cholecystectomy for perforated gallbladder and drainage of intraabdominal abscess Dr. Dwain Sarna - CT with IV and PO contrast today - pain regimen adjusted - Zosyn stopped 1/18. No leukocytosis, afeb, drain is SS - continue TPN - mobilize - drain ss with 20 cc/24h - continue for now   R Cephalic superficial venous thrombosis - [place] without DVT, warm compresses B/L LE swelling - symmetric and no pain and favor fluid over thromboembolism. Will hold off on [place] for now. Recc ambulation and elevation  FEN: FLD, TPN VTE: LMWH ID: Zosyn (1/13-1/18)  Hx of esophageal cancer s/p resection  HTN HLD GERD Depression  LOS: 8 days     Juliet Rude, [facility] 02/03/2023, 10:35 AM Please see Amion for pager number during day hours 7:00am-4:30pm

## 2023-02-03 NOTE — Progress Notes (Signed)
PHARMACY - TOTAL PARENTERAL NUTRITION CONSULT NOTE   Indication: Prolonged ileus  Patient Measurements: Height: 5\' 4"  (162.6 cm) Weight: 64.3 kg (141 lb 12.1 oz) IBW/kg (Calculated) : 54.7 TPN AdjBW (KG): 55.3 Body mass index is 24.33 kg/m.  Assessment: 68 year old female s/p open chole for perforated gallbladder and drainage of intra-abdominal abscess. Patient with poor oral intake prior to admission, approximately 10 days with very little intake. Expect prolonged ileus, patient also high risk for post op intra-abdominal abscess as well. Pharmacy to manage TPN.  Glucose / Insulin: no hx of DM SSI & CBG checks stopped 1/20.  Electrolytes: no labs 1/21, on 1/20: Na low at 131, despite max Na concentration in TPN.  Others WNL including K, Mag, Phos, CCorrCa, Cl, CO2 WNL Renal: WNL Hepatic: 1/20: LFTs slightly elevated, albumin 1.8 Trig 1/20 = 274 Intake / Output; MIVF:  - Output: urine x 6, drain 30 ml -PO intake: 600 ml - Stool: (none) Dulcolax supp given 1/19 & 1/20 1/20 colace & senna added GI Imaging: 1/14 CT abd/pelv - multiple fluid collections w/ development of some organization in keeping with developing abscesses; findings likely related to a perforated acute cholecystitis 1/21 CT abd/pelv: ordered GI Surgeries / Procedures:  1/14 open chole for perforated gallbladder and drainage of intra-abdominal abscess  Central access: PICC  TPN start date: 1/15  Nutritional Goals: Goal TPN rate is 70 mL/hr (provides 87 g of protein and 1710 kcals per day)  RD Assessment: Estimated Needs Total Energy Estimated Needs: 1650-1850 Total Protein Estimated Needs: 80-90 g Total Fluid Estimated Needs: 1.65-1.85 L  Current Nutrition: FLD.  TPN  Plan:  At 18:00 - Continue TPN at goal rate, 70 mL/hr Electrolytes in TPN: Na 154 mEq/L (max), K 50 mEq/L, Ca 5 mEq/L, Mg 5 mEq/L, and Phos 15 mmol/L. Cl:Ac 1:1  - Continue oral MVI/trace elements -  completed thiamine x 5 days   - no  mIVF  -Monitor TPN labs on Mon/Thurs, and prn.   Herby Abraham, Pharm.D Use secure chat for questions 02/03/2023 8:24 AM

## 2023-02-03 NOTE — Progress Notes (Signed)
Mobility Specialist - Progress Note   02/03/23 1044  Mobility  Activity Ambulated with assistance in hallway  Level of Assistance Independent after set-up  Assistive Device Other (Comment) (IV Pole)  Distance Ambulated (ft) 500 ft  Activity Response Tolerated well  Mobility Referral Yes  Mobility visit 1 Mobility  Mobility Specialist Start Time (ACUTE ONLY) 1032  Mobility Specialist Stop Time (ACUTE ONLY) 1044  Mobility Specialist Time Calculation (min) (ACUTE ONLY) 12 min   Pt received in bed and agreeable to mobility. No complaints during session. Pt to bathroom after session with all needs met.    [facility]

## 2023-02-03 NOTE — Progress Notes (Signed)
PROGRESS NOTE    Lauren [name]  ZOX:[id number] DOB: 1955-06-21 DOA: 01/26/2023 PCP: Bradd Canary, MD    Brief Narrative:  68 yo female with past medical history of prediabetes,hyperlipidemia presented to hospital with abdominal pain nausea vomiting diarrhea, for 1 week initially started with nausea and vomiting. In the ED, vitals stable except for tachycardia. Labs with mild hyponatremia hypomagnesemia with magnesium level of 1.4. Elevated LFTs. HIV was nonreactive. Urinalysis was negative. CT abdomen done in the ED showed bowel perforation and walled off abscess as well as cholecystitis and gallbladder perforation. Lipase was 10. Narrowing of CBD-?malignancy unclear. General surgery was consulted and patient was admitted hospital for further evaluation and treatment  Underwent diagnostic laparoscopy with open cholecystectomy with drainage of intra-abdominal abscess by general surgery on 1/14. Postoperatively patient has been very slow to improve. Medicine following for basic electrolyte management or if anything else arises.   Assessment & Plan:  Principal Problem:   Perforated bowel (HCC) Active Problems:   Perforated gallbladder     Acute cholecystitis with gallbladder perforation with possible walled off abscess Status post diagnostic laparoscopy with open cholecystectomy with drainage of abscess 1/14 Off Antibiotics.  Postop management by general surgery General Surgery repeating CT scan today   Hyponatremia/Hypokalemia/hypophosphatemia/hypocalcemia Aggressive electrolyte repletion as needed.  Thrombocytosis - Closely monitor   Elevated LFTs. Stable  Hyperlipidemia/prediabetes - Diet controlled  Depression - Venlafaxine   DVT prophylaxis: Per primary team, general surgery   Code Status: Full code  Family Communication: None at bedside Disposition-postop management by general surgery  Subjective: No complaints still abdominal pain  Examination:  General  exam: Appears calm and comfortable  Respiratory system: Clear to auscultation. Respiratory effort normal. Cardiovascular system: S1 & S2 heard, RRR. No JVD, murmurs, rubs, gallops or clicks. trace pedal edema. Gastrointestinal system: Abdomen is nondistended, soft and nontender. No organomegaly or masses felt.  No bowel sounds heard yet.  Central nervous system: Alert and oriented. No focal neurological deficits. Extremities: Symmetric 5 x 5 power. Skin: No rashes, lesions or ulcers Psychiatry: Judgement and insight appear normal. Mood & affect appropriate. RLE drain in place.  RUE PICC in place.                  Diet Orders (From admission, onward)     Start     Ordered   01/31/23 1123  Diet full liquid Room service appropriate? Yes; Fluid consistency: Thin  Diet effective now       Question Answer Comment  Room service appropriate? Yes   Fluid consistency: Thin      01/31/23 1123            Objective: Vitals:   02/02/23 2159 02/03/23 0334 02/03/23 0613 02/03/23 1236  BP: 132/70  (!) 141/65 (!) 145/85  Pulse: 97  91 98  Resp: 16  16 17   Temp: 98.8 F (37.1 C)  98.6 F (37 C) 98.4 F (36.9 C)  TempSrc: Oral  Oral Oral  SpO2: 96%  95% 96%  Weight:  64.3 kg    Height:        Intake/Output Summary (Last 24 hours) at 02/03/2023 1241 Last data filed at 02/03/2023 0934 Gross per 24 hour  Intake 490 ml  Output 11 ml  Net 479 ml   Filed Weights   01/31/23 0500 02/02/23 0500 02/03/23 0334  Weight: 62.2 kg 62.5 kg 64.3 kg    Scheduled Meds:  acetaminophen  1,000 mg Oral Q6H   Chlorhexidine Gluconate  Cloth  6 each Topical Daily   docusate sodium  100 mg Oral BID   enoxaparin (LOVENOX) injection  40 mg Subcutaneous Q24H   feeding supplement  1 Container Oral Q24H   methocarbamol  1,000 mg Oral TID   multivitamin with minerals  1 tablet Oral Daily   senna  1 tablet Oral QHS   sodium chloride flush  10-40 mL Intracatheter Q12H   venlafaxine XR  225 mg Oral  Q breakfast   Continuous Infusions:  TPN ADULT (ION) 70 mL/hr at 02/02/23 1730   TPN ADULT (ION)      Nutritional status Signs/Symptoms: energy intake < or equal to 50% for > or equal to 5 days, moderate muscle depletion Interventions: Boost Breeze, TPN Body mass index is 24.33 kg/m.  Data Reviewed:   CBC: Recent Labs  Lab 01/29/23 0305 01/30/23 1327 02/01/23 1042 02/02/23 0328 02/03/23 0500  WBC 15.4* 13.4* 12.7* 10.9* 9.0  HGB 10.9* 10.3* 9.0* 8.5* 8.6*  HCT 32.1* 30.9* 26.6* 25.7* 25.5*  MCV 91.2 93.1 90.8 92.4 89.8  PLT 475* 517* 642* 607* 717*   Basic Metabolic Panel: Recent Labs  Lab 01/29/23 0305 01/30/23 0248 01/31/23 0144 02/01/23 0311 02/02/23 0328  NA 126* 133* 133* 130* 131*  K 3.3* 3.1* 3.7 3.8 4.3  CL 93* 98 100 98 98  CO2 24 26 28 23 26   GLUCOSE 158* 159* 150* 151* 137*  BUN 9 9 11 13 13   CREATININE 0.56 <0.30* 0.51 0.47 0.41*  CALCIUM 7.6* 7.7* 7.9* 7.7* 8.0*  MG 2.0 2.1 1.9 1.9 2.1  PHOS 2.2* 2.4* 3.4 3.4 3.9   GFR: Estimated Creatinine Clearance: 57.3 mL/min (A) (by C-G formula based on SCr of 0.41 mg/dL (L)). Liver Function Tests: Recent Labs  Lab 01/28/23 0309 01/29/23 0305 02/02/23 0328  AST 22 23 43*  ALT 43 34 46*  ALKPHOS 82 85 176*  BILITOT 0.6 0.5 0.2  PROT 5.4* 5.4* 5.4*  ALBUMIN 2.1* 2.0* 1.8*   No results for input(s): "LIPASE", "AMYLASE" in the last 168 hours. No results for input(s): "AMMONIA" in the last 168 hours. Coagulation Profile: No results for input(s): "INR", "PROTIME" in the last 168 hours. Cardiac Enzymes: No results for input(s): "CKTOTAL", "CKMB", "CKMBINDEX", "TROPONINI" in the last 168 hours. BNP (last 3 results) No results for input(s): "PROBNP" in the last 8760 hours. HbA1C: No results for input(s): "HGBA1C" in the last 72 hours. CBG: Recent Labs  Lab 02/01/23 0056 02/01/23 0725 02/01/23 1559 02/01/23 2341 02/02/23 0749  GLUCAP 144* 149* 145* 140* 135*   Lipid Profile: Recent Labs     02/02/23 0328  TRIG 274*   Thyroid Function Tests: No results for input(s): "TSH", "T4TOTAL", "FREET4", "T3FREE", "THYROIDAB" in the last 72 hours. Anemia Panel: No results for input(s): "VITAMINB12", "FOLATE", "FERRITIN", "TIBC", "IRON", "RETICCTPCT" in the last 72 hours. Sepsis Labs: No results for input(s): "PROCALCITON", "LATICACIDVEN" in the last 168 hours.  Recent Results (from the past 240 hours)  MRSA Next Gen by PCR, Nasal     Status: None   Collection Time: 01/27/23  3:10 AM   Specimen: Nasal Mucosa; Nasal Swab  Result Value Ref Range Status   MRSA by PCR Next Gen NOT DETECTED NOT DETECTED Final    Comment: (NOTE) The GeneXpert MRSA Assay (FDA approved for NASAL specimens only), is one component of a comprehensive MRSA colonization surveillance program. It is not intended to diagnose MRSA infection nor to guide or monitor treatment for MRSA infections. Test performance  is not FDA approved in patients less than 44 years old. Performed at [facility], [address], [place], [place] [address]          Radiology Studies: VAS [place] UPPER EXTREMITY VENOUS DUPLEX Result Date: 02/02/2023 UPPER VENOUS STUDY  Patient Name:  Lauren [name] [facility]  Date of Exam:   02/02/2023 Medical Rec #: [id number]         Accession #:    [id number] Date of Birth: [birth year]         Patient Gender: F Patient Age:   80 years Exam Location:  [facility] Procedure:      VAS [place] UPPER EXTREMITY VENOUS DUPLEX Referring Phys: Johnny Bridge [facility] --------------------------------------------------------------------------------  Indications: Swelling Risk Factors: None identified. Anticoagulation: Lovenox. Limitations: Bandages. Comparison Study: None. Performing Technologist: Shona Simpson  Examination Guidelines: A complete evaluation includes B-mode imaging, spectral Doppler, color Doppler, and power Doppler as needed of all accessible portions of each vessel. Bilateral testing is considered  an integral part of a complete examination. Limited examinations for reoccurring indications may be performed as noted.  Right Findings: +----------+------------+---------+-----------+------------------+-------+ RIGHT     CompressiblePhasicitySpontaneous    Properties    Summary +----------+------------+---------+-----------+------------------+-------+ IJV           Full       Yes       Yes                              +----------+------------+---------+-----------+------------------+-------+ Subclavian    Full       Yes       Yes                              +----------+------------+---------+-----------+------------------+-------+ Axillary      Full       Yes       Yes                              +----------+------------+---------+-----------+------------------+-------+ Brachial      Full       Yes       Yes                              +----------+------------+---------+-----------+------------------+-------+ Radial        Full       Yes       Yes                              +----------+------------+---------+-----------+------------------+-------+ Ulnar         Full       Yes       Yes                              +----------+------------+---------+-----------+------------------+-------+ Cephalic    Partial                Yes    brightly echogenic Acute  +----------+------------+---------+-----------+------------------+-------+ Basilic       Full                 Yes                              +----------+------------+---------+-----------+------------------+-------+  Acute Partial thrombosis seen in the Cephalic vein from wrist to ACF.  Left Findings: +----------+------------+---------+-----------+----------+-------+ LEFT      CompressiblePhasicitySpontaneousPropertiesSummary +----------+------------+---------+-----------+----------+-------+ Subclavian    Full       Yes       Yes                       +----------+------------+---------+-----------+----------+-------+  Summary:  Right: No evidence of deep vein thrombosis in the upper extremity. Findings consistent with acute superficial vein thrombosis involving the right cephalic vein. Vessels containing PICC line show no obstructions.  Left: No evidence of thrombosis in the subclavian.  *See table(s) above for measurements and observations.  Diagnosing physician: Carolynn Sayers Electronically signed by Carolynn Sayers on 02/02/2023 at 2:05:55 PM.    Final            LOS: 8 days   Time spent= 35 mins    Miguel Rota, MD [facility]  If 7PM-7AM, please contact night-coverage  02/03/2023, 12:41 PM

## 2023-02-04 LAB — CBC
HCT: 24.8 % — ABNORMAL LOW (ref 36.0–46.0)
Hemoglobin: 8.5 g/dL — ABNORMAL LOW (ref 12.0–15.0)
MCH: 30.8 pg (ref 26.0–34.0)
MCHC: 34.3 g/dL (ref 30.0–36.0)
MCV: 89.9 fL (ref 80.0–100.0)
Platelets: 787 10*3/uL — ABNORMAL HIGH (ref 150–400)
RBC: 2.76 MIL/uL — ABNORMAL LOW (ref 3.87–5.11)
RDW: 14.2 % (ref 11.5–15.5)
WBC: 10.5 10*3/uL (ref 4.0–10.5)
nRBC: 0 % (ref 0.0–0.2)

## 2023-02-04 LAB — CALCIUM, IONIZED: Calcium, Ionized, Serum: 5.1 mg/dL (ref 4.5–5.6)

## 2023-02-04 MED ORDER — OXYCODONE HCL 5 MG PO TABS
10.0000 mg | ORAL_TABLET | ORAL | Status: DC | PRN
  Administered 2023-02-04 (×2): 10 mg via ORAL
  Administered 2023-02-04: 15 mg via ORAL
  Administered 2023-02-05 (×2): 10 mg via ORAL
  Filled 2023-02-04 (×2): qty 2
  Filled 2023-02-04: qty 3
  Filled 2023-02-04 (×2): qty 2

## 2023-02-04 MED ORDER — POLYETHYLENE GLYCOL 3350 17 G PO PACK
17.0000 g | PACK | Freq: Every day | ORAL | Status: DC
  Administered 2023-02-04 – 2023-02-05 (×2): 17 g via ORAL
  Filled 2023-02-04 (×2): qty 1

## 2023-02-04 MED ORDER — FUROSEMIDE 10 MG/ML IJ SOLN
40.0000 mg | Freq: Once | INTRAMUSCULAR | Status: AC
  Administered 2023-02-04: 40 mg via INTRAVENOUS
  Filled 2023-02-04: qty 4

## 2023-02-04 MED ORDER — METHOCARBAMOL 500 MG PO TABS
1000.0000 mg | ORAL_TABLET | Freq: Four times a day (QID) | ORAL | Status: DC
Start: 2023-02-04 — End: 2023-02-05
  Administered 2023-02-04 – 2023-02-05 (×4): 1000 mg via ORAL
  Filled 2023-02-04 (×4): qty 2

## 2023-02-04 NOTE — Progress Notes (Signed)
Progress Note  8 Days Post-Op  Subjective: Tolerating liquids, had some bowel function yesterday after CT contrast. She is still a little bloated and not passing large amounts of flatus. Feeling emotionally spent and still struggling with pain control still. Has incisional pain but also back pain from not moving as much. She has been ambulating 3x daily, we discussed trying to increase this if pain better controlled.   Objective: Vital signs in last 24 hours: Temp:  [98.4 F (36.9 C)-99 F (37.2 C)] 98.8 F (37.1 C) (01/22 0549) Pulse Rate:  [94-103] 94 (01/22 0549) Resp:  [15-17] 15 (01/22 0549) BP: (119-145)/(65-85) 132/75 (01/22 0549) SpO2:  [95 %-97 %] 97 % (01/22 0549) Weight:  [64.6 kg] 64.6 kg (01/22 0500) Last BM Date : 01/25/23  Intake/Output from previous day: 01/21 0701 - 01/22 0700 In: 1824.6 [P.O.:1040; I.V.:784.6] Out: 2865 [Urine:2800; Drains:65] Intake/Output this shift: No intake/output data recorded.  PE: General: pleasant, WD, WN female who is laying in bed in NAD Heart: regular, rate, and rhythm.   Lungs:  Respiratory effort nonlabored Abd: soft, appropriately ttp, incisions C/D/I with staples present, drain with Serous fluid, mild distention Psych: A&Ox3 with an appropriate affect.    Lab Results:  Recent Labs    02/03/23 0500 02/04/23 0351  WBC 9.0 10.5  HGB 8.6* 8.5*  HCT 25.5* 24.8*  PLT 717* 787*   BMET Recent Labs    02/02/23 0328  NA 131*  K 4.3  CL 98  CO2 26  GLUCOSE 137*  BUN 13  CREATININE 0.41*  CALCIUM 8.0*   PT/INR No results for input(s): "LABPROT", "INR" in the last 72 hours. CMP     Component Value Date/Time   NA 131 (L) 02/02/2023 0328   K 4.3 02/02/2023 0328   CL 98 02/02/2023 0328   CO2 26 02/02/2023 0328   GLUCOSE 137 (H) 02/02/2023 0328   BUN 13 02/02/2023 0328   CREATININE 0.41 (L) 02/02/2023 0328   CREATININE 0.81 01/23/2015 1022   CALCIUM 8.0 (L) 02/02/2023 0328   PROT 5.4 (L) 02/02/2023 0328    ALBUMIN 1.8 (L) 02/02/2023 0328   AST 43 (H) 02/02/2023 0328   ALT 46 (H) 02/02/2023 0328   ALKPHOS 176 (H) 02/02/2023 0328   BILITOT 0.2 02/02/2023 0328   GFRNONAA >60 02/02/2023 0328   GFRAA  05/03/2008 0425    >60        The eGFR has been calculated using the MDRD equation. This calculation has not been validated in all clinical situations. eGFR's persistently <60 mL/min signify possible Chronic Kidney Disease.   Lipase     Component Value Date/Time   LIPASE 10 (L) 01/26/2023 1214       Studies/Results: CT ABDOMEN PELVIS W CONTRAST Result Date: 02/03/2023 CLINICAL DATA:  Abdominal pain postop EXAM: CT ABDOMEN AND PELVIS WITH CONTRAST TECHNIQUE: Multidetector CT imaging of the abdomen and pelvis was performed using the standard protocol following bolus administration of intravenous contrast. RADIATION DOSE REDUCTION: This exam was performed according to the departmental dose-optimization program which includes automated exposure control, adjustment of the mA and/or kV according to patient size and/or use of iterative reconstruction technique. CONTRAST:  OMNIPAQUE IOHEXOL 300 MG/ML  SOLN COMPARISON:  01/27/2023 and previous FINDINGS: Lower chest: Increase in small pleural effusions. Worsening consolidation/atelectasis at the lung bases right greater than left. Venous catheter to the distal SVC. LAD coronary calcifications. Hepatobiliary: Interval cholecystectomy. Drain catheter inferior to the liver. Stable mild central intrahepatic biliary ductal  dilatation. No focal liver lesion. Ectatic CBD. Pancreas: Unremarkable. No pancreatic ductal dilatation or surrounding inflammatory changes. Spleen: Normal in size without focal abnormality. Adrenals/Urinary Tract: Surgical clips medial to the right adrenal gland . No adrenal mass. Ptotic right kidney. No focal renal lesion, urolithiasis, or hydronephrosis. Mild parenchymal atrophy in the left upper pole. Urinary bladder distended.  Stomach/Bowel: Post gastric pull-through procedure. Stomach is nondistended. Small bowel is nondilated. Good distal patches of oral contrast material. Appendix not identified. The colon is partially distended, with a few distal descending and sigmoid diverticula; no adjacent inflammatory change. Vascular/Lymphatic: Moderate scattered aortoiliac calcified atheromatous plaque. No AAA. Portal vein patent. Prominent retroperitoneal lymph nodes, largest 9 mm retrocaval inferior to the renal vein. No pelvic or mesenteric adenopathy. Reproductive: Status post hysterectomy. No adnexal masses. Other: 2.2 cm peripherally enhancing fluid collection in the cul-de-sac to the right of midline, decreased from previous. No new or enlarging extraluminal fluid collections. No free air. Musculoskeletal: Thoracolumbar mild dextroscoliosis with scattered spondylitic change. Hip DJD right worse than left. IMPRESSION: 1. Interval cholecystectomy with drain catheter inferior to the liver. 2. 2.2 cm peripherally enhancing fluid collection in the cul-de-sac to the right of midline, decreased from previous. 3. Increase in small pleural effusions with worsening consolidation/atelectasis at the lung bases right greater than left. 4.  Aortic Atherosclerosis (ICD10-I70.0). Electronically Signed   By: Corlis Leak M.D.   On: 02/03/2023 16:21   VAS [place] UPPER EXTREMITY VENOUS DUPLEX Result Date: 02/02/2023 UPPER VENOUS STUDY  Patient Name:  RAGAD ROTHROCK [facility]  Date of Exam:   02/02/2023 Medical Rec #: [id number]         Accession #:    [id number] Date of Birth: April 21, 1955         Patient Gender: F Patient Age:   68 years Exam Location:  [facility] Procedure:      VAS [place] UPPER EXTREMITY VENOUS DUPLEX Referring Phys: Johnny Bridge [facility] --------------------------------------------------------------------------------  Indications: Swelling Risk Factors: None identified. Anticoagulation: Lovenox. Limitations: Bandages. Comparison Study: None. Performing  Technologist: Shona Simpson  Examination Guidelines: A complete evaluation includes B-mode imaging, spectral Doppler, color Doppler, and power Doppler as needed of all accessible portions of each vessel. Bilateral testing is considered an integral part of a complete examination. Limited examinations for reoccurring indications may be performed as noted.  Right Findings: +----------+------------+---------+-----------+------------------+-------+ RIGHT     CompressiblePhasicitySpontaneous    Properties    Summary +----------+------------+---------+-----------+------------------+-------+ IJV           Full       Yes       Yes                              +----------+------------+---------+-----------+------------------+-------+ Subclavian    Full       Yes       Yes                              +----------+------------+---------+-----------+------------------+-------+ Axillary      Full       Yes       Yes                              +----------+------------+---------+-----------+------------------+-------+ Brachial      Full       Yes       Yes                              +----------+------------+---------+-----------+------------------+-------+  Radial        Full       Yes       Yes                              +----------+------------+---------+-----------+------------------+-------+ Ulnar         Full       Yes       Yes                              +----------+------------+---------+-----------+------------------+-------+ Cephalic    Partial                Yes    brightly echogenic Acute  +----------+------------+---------+-----------+------------------+-------+ Basilic       Full                 Yes                              +----------+------------+---------+-----------+------------------+-------+ Acute Partial thrombosis seen in the Cephalic vein from wrist to ACF.  Left Findings:  +----------+------------+---------+-----------+----------+-------+ LEFT      CompressiblePhasicitySpontaneousPropertiesSummary +----------+------------+---------+-----------+----------+-------+ Subclavian    Full       Yes       Yes                      +----------+------------+---------+-----------+----------+-------+  Summary:  Right: No evidence of deep vein thrombosis in the upper extremity. Findings consistent with acute superficial vein thrombosis involving the right cephalic vein. Vessels containing PICC line show no obstructions.  Left: No evidence of thrombosis in the subclavian.  *See table(s) above for measurements and observations.  Diagnosing physician: Carolynn Sayers Electronically signed by Carolynn Sayers on 02/02/2023 at 2:05:55 PM.    Final     Anti-infectives: Anti-infectives (From admission, onward)    Start     Dose/Rate Route Frequency Ordered Stop   01/26/23 2200  piperacillin-tazobactam (ZOSYN) IVPB 3.375 g  Status:  Discontinued        3.375 g 12.5 mL/hr over 240 Minutes Intravenous Every 8 hours 01/26/23 1532 02/01/23 0758   01/26/23 1926  metroNIDAZOLE (FLAGYL) IVPB 500 mg  Status:  Discontinued        500 mg 100 mL/hr over 60 Minutes Intravenous Every 12 hours 01/26/23 1926 01/26/23 1934   01/26/23 1545  piperacillin-tazobactam (ZOSYN) IVPB 3.375 g        3.375 g 100 mL/hr over 30 Minutes Intravenous  Once 01/26/23 1531 01/26/23 1612        Assessment/Plan  POD8 s/p open cholecystectomy for perforated gallbladder and drainage of intraabdominal abscess Dr. Dwain Sarna - CT yesterday with decrease in size of pelvic collection and cholecystectomy clips, bilateral pleural effusions  - pain regimen adjusted this AM - had some bowel function overnight - advance to soft  - Zosyn stopped 1/18. No leukocytosis, afeb, drain is SS - start to wean TPN - mobilize - drain ss with 65 cc/24h - continue for now   R Cephalic superficial venous thrombosis - [place] without  DVT, warm compresses B/L LE swelling - symmetric and no pain and favor fluid over thromboembolism. Will hold off on [place] for now. Recc ambulation and elevation  FEN: soft diet, start to wean TPN  VTE: LMWH ID: Zosyn (1/13-1/18)  Hx of esophageal cancer s/p resection  HTN HLD GERD Depression  LOS: 9 days     Juliet Rude, [facility] 02/04/2023, 9:17 AM Please see Amion for pager number during day hours 7:00am-4:30pm

## 2023-02-04 NOTE — Progress Notes (Addendum)
PHARMACY - TOTAL PARENTERAL NUTRITION CONSULT NOTE   Indication: Prolonged ileus  Patient Measurements: Height: 5\' 4"  (162.6 cm) Weight: 64.6 kg (142 lb 6.7 oz) IBW/kg (Calculated) : 54.7 TPN AdjBW (KG): 55.3 Body mass index is 24.45 kg/m.  Assessment: 68 year old female s/p open chole for perforated gallbladder and drainage of intra-abdominal abscess. Patient with poor oral intake prior to admission, approximately 10 days with very little intake. Expect prolonged ileus, patient also high risk for post op intra-abdominal abscess as well. Pharmacy to manage TPN.  Glucose / Insulin: no hx of DM SSI & CBG checks stopped 1/20.  Electrolytes: no labs today Renal: WNL Hepatic: 1/20: LFTs slightly elevated, albumin 1.8 Trig 1/20 = 274 Intake / Output; MIVF:  - Output: urine x 2, drain 65 ml -PO intake: 1040 ml - Stool: x 1 Dulcolax supp given 1/19 & 1/20 1/20 colace & senna added GI Imaging: 1/14 CT abd/pelv - multiple fluid collections w/ development of some organization in keeping with developing abscesses; findings likely related to a perforated acute cholecystitis 1/21 CT abd/pelv:decr size of pelvic collection;  GI Surgeries / Procedures:  1/14 open chole for perforated gallbladder and drainage of intra-abdominal abscess  Central access: PICC  TPN start date: 1/15  Nutritional Goals: Goal TPN rate is 70 mL/hr (provides 87 g of protein and 1710 kcals per day)  RD Assessment: Estimated Needs Total Energy Estimated Needs: 1650-1850 Total Protein Estimated Needs: 80-90 g Total Fluid Estimated Needs: 1.65-1.85 L  Current Nutrition: soft diet, wean off TPN 1/22 per CCS  Plan:  Stop TPN after today's bag completed Continue PO MVI DC TPN labs Pharmacy to sign off  Herby Abraham, Pharm.D Use secure chat for questions 02/04/2023 9:55 AM

## 2023-02-04 NOTE — TOC Progression Note (Signed)
Transition of Care [facility]) - Progression Note    Patient Details  Name: Lauren [name] MRN: [id number] Date of Birth: 1955-11-25  Transition of Care [facility]) CM/SW Contact  Amada Jupiter, LCSW Phone Number: 02/04/2023, 10:51 AM  Clinical Narrative:     TOC continuing to monitor for any potential dc needs.  Expected Discharge Plan: Home/Self Care Barriers to Discharge: Continued Medical Work up  Expected Discharge Plan and Services                                               Social Determinants of Health (SDOH) Interventions SDOH Screenings   Food Insecurity: No Food Insecurity (01/28/2023)  Housing: Low Risk  (01/28/2023)  Transportation Needs: No Transportation Needs (01/28/2023)  Utilities: Not At Risk (01/27/2023)  Alcohol Screen: Low Risk  (06/12/2022)  Depression (PHQ2-9): Low Risk  (08/18/2022)  Financial Resource Strain: Low Risk  (06/05/2021)  Physical Activity: Insufficiently Active (06/05/2021)  Social Connections: Unknown (01/27/2023)  Stress: No Stress Concern Present (06/05/2021)  Tobacco Use: Medium Risk (01/27/2023)    Readmission Risk Interventions     No data to display

## 2023-02-04 NOTE — Progress Notes (Signed)
Mobility Specialist - Progress Note   02/04/23 1218  Mobility  Activity Transferred from bed to chair  Level of Assistance Modified independent, requires aide device or extra time  Assistive Device None  Distance Ambulated (ft) 20 ft  Activity Response Tolerated well  Mobility Referral Yes  Mobility visit 1 Mobility  Mobility Specialist Start Time (ACUTE ONLY) 0933  Mobility Specialist Stop Time (ACUTE ONLY) 0941  Mobility Specialist Time Calculation (min) (ACUTE ONLY) 8 min   Pt received in bed declining mobility and agreeable to transfer to the recliner. No complaints during transfer. Pt to recliner after session with all needs met. Pt voiced that she will ambulate in the afternoon after 12pm.     Schuyler Amor Mobility Specialist

## 2023-02-04 NOTE — Progress Notes (Signed)
Discussed with Gen Sx. The patient doesn't have active issues from a medicine standpoint. We will sign off, reconsult if needed.   Stephania Fragmin MD

## 2023-02-04 NOTE — Plan of Care (Signed)
  Problem: Nutrition: Goal: Adequate nutrition will be maintained Outcome: Progressing   

## 2023-02-05 LAB — CBC
HCT: 24.7 % — ABNORMAL LOW (ref 36.0–46.0)
Hemoglobin: 8.3 g/dL — ABNORMAL LOW (ref 12.0–15.0)
MCH: 30.5 pg (ref 26.0–34.0)
MCHC: 33.6 g/dL (ref 30.0–36.0)
MCV: 90.8 fL (ref 80.0–100.0)
Platelets: 784 10*3/uL — ABNORMAL HIGH (ref 150–400)
RBC: 2.72 MIL/uL — ABNORMAL LOW (ref 3.87–5.11)
RDW: 14.1 % (ref 11.5–15.5)
WBC: 10.2 10*3/uL (ref 4.0–10.5)
nRBC: 0 % (ref 0.0–0.2)

## 2023-02-05 MED ORDER — IBUPROFEN 800 MG PO TABS: 800.0000 mg | ORAL_TABLET | Freq: Three times a day (TID) | ORAL | 0 refills | Status: DC | PRN

## 2023-02-05 MED ORDER — OXYCODONE HCL 5 MG PO TABS: 5.0000 mg | ORAL_TABLET | Freq: Four times a day (QID) | ORAL | 0 refills | Status: DC | PRN

## 2023-02-05 NOTE — Plan of Care (Signed)
  Problem: Activity: Goal: Risk for activity intolerance will decrease Outcome: Adequate for Discharge   Problem: Nutrition: Goal: Adequate nutrition will be maintained Outcome: Completed/Met   Problem: Elimination: Goal: Will not experience complications related to bowel motility Outcome: Completed/Met   Problem: Elimination: Goal: Will not experience complications related to urinary retention Outcome: Completed/Met

## 2023-02-05 NOTE — Progress Notes (Signed)
AVS reviewed w/ pt  who verbalized an understanding-Pt dressing for home. Pt's husband here to pick up pt. Additional info given to pt  regarding high fiber diet.

## 2023-02-05 NOTE — Discharge Summary (Signed)
Physician Discharge Summary  Patient ID: Lauren [name] MRN: [id number] DOB/AGE: 1955/07/15 68 y.o.  Admit date: 01/26/2023 Discharge date: 02/05/2023  Admission Diagnoses: Acute cholecystitis with perforation and peritonitis  Discharge Diagnoses:  Principal Problem:   Perforated bowel (HCC) Active Problems:   Perforated gallbladder   Protein-calorie malnutrition, severe   Discharged Condition: good  Hospital Course: Patient admitted on 01/26/2023 due to 1.  Peritonitis and perforated gallbladder.  She underwent open cholecystectomy by Dr. Emelia Loron on 01/27/2023 with drain placement.  She developed a postoperative ileus.  This was managed with TNA NG tube and antibiotics.  This slowly resolved over the next week.  She began to ambulate more.  JP output was minimal.  Repeat CT scan showed minimal fluid with a indiscrete 2 cm collection adjacent to the duodenum.  Her labs are slowly normalized.  Her bowels move.  Her JP drain was removed as well as her PICC line.  She was discharged home on postop day 10 in good condition    Significant Diagnostic Studies: CBC    Component Value Date/Time   WBC 10.2 02/05/2023 0358   RBC 2.72 (L) 02/05/2023 0358   HGB 8.3 (L) 02/05/2023 0358   HGB 13.2 01/19/2009 1132   HCT 24.7 (L) 02/05/2023 0358   HCT 39.1 01/19/2009 1132   PLT 784 (H) 02/05/2023 0358   PLT 281 01/19/2009 1132   MCV 90.8 02/05/2023 0358   MCV 98.3 01/19/2009 1132   MCH 30.5 02/05/2023 0358   MCHC 33.6 02/05/2023 0358   RDW 14.1 02/05/2023 0358   RDW 14.5 01/19/2009 1132   LYMPHSABS 0.9 01/26/2023 1214   LYMPHSABS 0.7 (L) 01/19/2009 1132   MONOABS 0.2 01/26/2023 1214   MONOABS 0.4 01/19/2009 1132   EOSABS 0.0 01/26/2023 1214   EOSABS 0.1 01/19/2009 1132   BASOSABS 0.0 01/26/2023 1214   BASOSABS 0.0 01/19/2009 1132       Latest Ref Rng & Units 02/02/2023    3:28 AM 02/01/2023    3:11 AM 01/31/2023    1:44 AM  CMP  Glucose 70 - 99 mg/dL 119  147  829   BUN 8  - 23 mg/dL 13  13  11    Creatinine 0.44 - 1.00 mg/dL 5.62  1.30  8.65   Sodium 135 - 145 mmol/L 131  130  133   Potassium 3.5 - 5.1 mmol/L 4.3  3.8  3.7   Chloride 98 - 111 mmol/L 98  98  100   CO2 22 - 32 mmol/L 26  23  28    Calcium 8.9 - 10.3 mg/dL 8.0  7.7  7.9   Total Protein 6.5 - 8.1 g/dL 5.4     Total Bilirubin 0.0 - 1.2 mg/dL 0.2     Alkaline Phos 38 - 126 U/L 176     AST 15 - 41 U/L 43     ALT 0 - 44 U/L 46        Treatments: surgery: Open cholecystectomy  Discharge Exam: Blood pressure 136/70, pulse 90, temperature 98.8 F (37.1 C), temperature source Oral, resp. rate 17, height 5\' 4"  (1.626 m), weight 64.6 kg, SpO2 99%. General appearance: alert and cooperative Resp: clear to auscultation bilaterally Cardio: Normal sinus rhythm Incision/Wound: Right upper quadrant incision clean dry intact.  Staples in place.  JP drain is serous scant.  Umbilical staples noted.  Overall, soft with minimal tenderness around the incision.  No rebound guarding or signs of peritonitis.  Disposition: Discharge disposition: 01-Home or  Self Care       Discharge Instructions     Diet - low sodium heart healthy   Complete by: As directed    Increase activity slowly   Complete by: As directed       Allergies as of 02/05/2023   No Active Allergies      Medication List     TAKE these medications    acetaminophen 500 MG tablet Commonly known as: TYLENOL Take 1,000 mg by mouth every 6 (six) hours as needed for moderate pain (pain score 4-6) or mild pain (pain score 1-3).   ibuprofen 800 MG tablet Commonly known as: ADVIL Take 1 tablet (800 mg total) by mouth every 8 (eight) hours as needed.   omeprazole 20 MG capsule Commonly known as: PRILOSEC Take 1 capsule (20 mg total) by mouth daily.   oxyCODONE 5 MG immediate release tablet Commonly known as: Oxy IR/ROXICODONE Take 1 tablet (5 mg total) by mouth every 6 (six) hours as needed for severe pain (pain score 7-10).    polyethylene glycol 17 g packet Commonly known as: MIRALAX / GLYCOLAX Take 17 g by mouth daily as needed for mild constipation or moderate constipation.   rosuvastatin 5 MG tablet Commonly known as: CRESTOR Take 1 tablet (5 mg total) by mouth daily.   venlafaxine XR 150 MG 24 hr capsule Commonly known as: EFFEXOR-XR TAKE 1 CAPSULE BY MOUTH EVERY DAY WITH BREAKFAST What changed:  how much to take how to take this when to take this additional instructions   venlafaxine XR 75 MG 24 hr capsule Commonly known as: Effexor XR Take 1 capsule (75 mg total) by mouth daily with breakfast. Total daily dose of 225 What changed: additional instructions   Vitamin D 50 MCG (2000 UT) Caps Take 1 capsule by mouth daily.        Follow-up Information     [facility] Follow up on 02/10/2023.   Specialty: General Surgery Why: 945 appt for staple removal Contact information: [address] [place] [address] [phone]        Emelia Loron, MD Follow up on 02/24/2023.   Specialty: General Surgery Why: 1115 postop appointment Contact information: [address] [place] [address] [phone]                 Signed: Dortha Schwalbe 02/05/2023, 10:53 AM

## 2023-02-05 NOTE — Discharge Instructions (Addendum)
[facility]      [facility], [place] [phone]  OPEN ABDOMINAL SURGERY: POST OP INSTRUCTIONS  Always review your discharge instruction sheet given to you by the facility where your surgery was performed.  IF YOU HAVE DISABILITY OR FAMILY LEAVE FORMS, YOU MUST BRING THEM TO THE OFFICE FOR PROCESSING.  PLEASE DO NOT GIVE THEM TO YOUR DOCTOR.  A prescription for pain medication may be given to you upon discharge.  Take your pain medication as prescribed, if needed.  If narcotic pain medicine is not needed, then you may take acetaminophen (Tylenol) or ibuprofen (Advil) as needed. Take your usually prescribed medications unless otherwise directed. If you need a refill on your pain medication, please contact your pharmacy. They will contact our office to request authorization.  Prescriptions will not be filled after 5pm or on week-ends. You should follow a light diet the first few days after arrival home, such as soup and crackers, pudding, etc.unless your doctor has advised otherwise. A high-fiber, low fat diet can be resumed as tolerated.   Be sure to include lots of fluids daily. Most patients will experience some swelling and bruising on the chest and neck area.  Ice packs will help.  Swelling and bruising can take several days to resolve Most patients will experience some swelling and bruising in the area of the incision. Ice pack will help. Swelling and bruising can take several days to resolve..  It is common to experience some constipation if taking pain medication after surgery.  Increasing fluid intake and taking a stool softener will usually help or prevent this problem from occurring.  A mild laxative (Milk of Magnesia or Miralax) should be taken according to package directions if there are no bowel movements after 48 hours.  You may have steri-strips (small skin tapes) in place directly over the incision.  These strips should be left on the skin for 7-10 days.  If your surgeon used skin  glue on the incision, you may shower in 24 hours.  The glue will flake off over the next 2-3 weeks.  Any sutures or staples will be removed at the office during your follow-up visit. You may find that a light gauze bandage over your incision may keep your staples from being rubbed or pulled. You may shower and replace the bandage daily. ACTIVITIES:  You may resume regular (light) daily activities beginning the next day--such as daily self-care, walking, climbing stairs--gradually increasing activities as tolerated.  You may have sexual intercourse when it is comfortable.  Refrain from any heavy lifting or straining until approved by your doctor. You may drive when you no longer are taking prescription pain medication, you can comfortably wear a seatbelt, and you can safely maneuver your car and apply brakes Return to Work: ___________________________________ Lauren [name] should see your doctor in the office for a follow-up appointment approximately two weeks after your surgery.  Make sure that you call for this appointment within a day or two after you arrive home to insure a convenient appointment time. OTHER INSTRUCTIONS:  _____________________________________________________________ _____________________________________________________________  WHEN TO CALL YOUR DOCTOR: Fever over 101.0 Inability to urinate Nausea and/or vomiting Extreme swelling or bruising Continued bleeding from incision. Increased pain, redness, or drainage from the incision. Difficulty swallowing or breathing Muscle cramping or spasms. Numbness or tingling in hands or feet or around lips.  The clinic staff is available to answer your questions during regular business hours.  Please don't hesitate to call and ask to speak to one of  the nurses if you have concerns.  For further questions, please visit [email or web address] high

## 2023-02-06 ENCOUNTER — Telehealth: Payer: Self-pay

## 2023-02-06 NOTE — Transitions of Care (Post Inpatient/ED Visit) (Signed)
02/06/2023  Name: Lauren [name] MRN: [id number] DOB: 1955-03-30  Today's TOC FU Call Status: Today's TOC FU Call Status:: Successful TOC FU Call Completed TOC FU Call Complete Date: 02/06/23 Patient's Name and Date of Birth confirmed.  Transition Care Management Follow-up Telephone Call Date of Discharge: 02/05/23 Discharge Facility: [facility]) Type of Discharge: Inpatient Admission Primary Inpatient Discharge Diagnosis:: Perforated Bpwel and Gall Bladder How have you been since you were released from the hospital?:  (Patient notes she slept all night, was able to take a shower today.  She is eating well and moving her bowels) Any questions or concerns?: No  Items Reviewed: Did you receive and understand the discharge instructions provided?: Yes Medications obtained,verified, and reconciled?: Yes (Medications Reviewed) Any new allergies since your discharge?: No Dietary orders reviewed?: Yes Type of Diet Ordered:: Low sodium heart health Do you have support at home?: Yes People in Home: spouse Name of Support/Comfort Primary Source: Rosanne Ashing  Medications Reviewed Today: Medications Reviewed Today     Reviewed by Jodelle Gross, RN (Case Manager) on 02/06/23 at 0930  Med List Status: <None>   Medication Order Taking? Sig Documenting Provider Last Dose Status Informant  acetaminophen (TYLENOL) 500 MG tablet [id number] Yes Take 1,000 mg by mouth every 6 (six) hours as needed for moderate pain (pain score 4-6) or mild pain (pain score 1-3). [provider] Taking Active Self, Spouse/Significant Other, Pharmacy Records           Med Note Jory Ee, Cindra Presume Jan 26, 2023  7:53 PM) Patient took this today but was pretty sure she threw it up.  Cholecalciferol (VITAMIN D) 50 MCG (2000 UT) CAPS [id number] Yes Take 1 capsule by mouth daily. [provider] Taking Active Self, Spouse/Significant Other, Pharmacy Records  ibuprofen (ADVIL) 800 MG tablet [id number]  Yes Take 1 tablet (800 mg total) by mouth every 8 (eight) hours as needed. Harriette Bouillon, MD Taking Active   omeprazole (PRILOSEC) 20 MG capsule [id number] Yes Take 1 capsule (20 mg total) by mouth daily. Bradd Canary, MD Taking Active Self, Spouse/Significant Other, Pharmacy Records  oxyCODONE (OXY IR/ROXICODONE) 5 MG immediate release tablet [id number] Yes Take 1 tablet (5 mg total) by mouth every 6 (six) hours as needed for severe pain (pain score 7-10). Harriette Bouillon, MD Taking Active   polyethylene glycol (MIRALAX / GLYCOLAX) 17 g packet [id number] Yes Take 17 g by mouth daily as needed for mild constipation or moderate constipation. [provider] Taking Active Self, Spouse/Significant Other, Pharmacy Records  rosuvastatin (CRESTOR) 5 MG tablet [id number] Yes Take 1 tablet (5 mg total) by mouth daily. Bradd Canary, MD Taking Active Self, Spouse/Significant Other, Pharmacy Records  venlafaxine XR (EFFEXOR XR) 75 MG 24 hr capsule [id number] Yes Take 1 capsule (75 mg total) by mouth daily with breakfast. Total daily dose of 225  Patient taking differently: Take 75 mg by mouth daily with breakfast. Take with 150 mg Effexor XR for a combined total dose of 225 mg.   Bradd Canary, MD Taking Active Self, Spouse/Significant Other, Pharmacy Records  venlafaxine XR (EFFEXOR-XR) 150 MG 24 hr capsule [id number] Yes TAKE 1 CAPSULE BY MOUTH EVERY DAY WITH BREAKFAST  Patient taking differently: Take 150 mg by mouth daily with breakfast. Take with 75 mg Effexor XR for a combined total dose of 225 mg.   Bradd Canary, MD Taking Active Self, Spouse/Significant Other, Pharmacy Records  Home Care and Equipment/Supplies: Were Home Health Services Ordered?: No Any new equipment or medical supplies ordered?: No  Functional Questionnaire: Do you need assistance with bathing/showering or dressing?: No Do you need assistance with meal preparation?: No Do you need assistance with  eating?: No Do you have difficulty maintaining continence: No Do you need assistance with getting out of bed/getting out of a chair/moving?: No Do you have difficulty managing or taking your medications?: No  Follow up appointments reviewed: PCP Follow-up appointment confirmed?: Yes Date of PCP follow-up appointment?: 03/02/23 Follow-up Provider: Zigmund Daniel Specialist [facility] Follow-up appointment confirmed?: Yes Date of Specialist follow-up appointment?: 02/10/23 Follow-Up Specialty Provider:: [facility] Do you need transportation to your follow-up appointment?: No Do you understand care options if your condition(s) worsen?: Yes-patient verbalized understanding  SDOH Interventions Today    Flowsheet Row Most Recent Value  SDOH Interventions   Food Insecurity Interventions Intervention Not Indicated  Housing Interventions Intervention Not Indicated  Transportation Interventions Intervention Not Indicated  Utilities Interventions Intervention Not Indicated      Jodelle Gross RN, BSN, CCM [facility] RN Care Manager/Transition of Care Direct Dial: [phone]  Fax: [phone]

## 2023-02-15 ENCOUNTER — Other Ambulatory Visit: Payer: Self-pay | Admitting: Family Medicine

## 2023-02-16 MED ORDER — VENLAFAXINE HCL ER 150 MG PO CP24: 150.0000 mg | ORAL_CAPSULE | Freq: Every day | ORAL | 1 refills | Status: DC

## 2023-02-17 DIAGNOSIS — F331 Major depressive disorder, recurrent, moderate: Secondary | ICD-10-CM | POA: Diagnosis not present

## 2023-02-23 ENCOUNTER — Other Ambulatory Visit: Payer: Self-pay | Admitting: Family Medicine

## 2023-02-23 ENCOUNTER — Encounter: Payer: Self-pay | Admitting: Family Medicine

## 2023-02-23 DIAGNOSIS — D509 Iron deficiency anemia, unspecified: Secondary | ICD-10-CM

## 2023-02-23 DIAGNOSIS — R109 Unspecified abdominal pain: Secondary | ICD-10-CM

## 2023-02-23 DIAGNOSIS — R7989 Other specified abnormal findings of blood chemistry: Secondary | ICD-10-CM

## 2023-02-23 DIAGNOSIS — R739 Hyperglycemia, unspecified: Secondary | ICD-10-CM

## 2023-02-24 ENCOUNTER — Telehealth (HOSPITAL_BASED_OUTPATIENT_CLINIC_OR_DEPARTMENT_OTHER): Payer: Self-pay

## 2023-02-24 ENCOUNTER — Other Ambulatory Visit: Payer: Self-pay | Admitting: Emergency Medicine

## 2023-02-24 DIAGNOSIS — F331 Major depressive disorder, recurrent, moderate: Secondary | ICD-10-CM | POA: Diagnosis not present

## 2023-02-24 DIAGNOSIS — Z1231 Encounter for screening mammogram for malignant neoplasm of breast: Secondary | ICD-10-CM

## 2023-02-24 DIAGNOSIS — R739 Hyperglycemia, unspecified: Secondary | ICD-10-CM

## 2023-02-24 DIAGNOSIS — R7989 Other specified abnormal findings of blood chemistry: Secondary | ICD-10-CM

## 2023-02-24 DIAGNOSIS — R109 Unspecified abdominal pain: Secondary | ICD-10-CM

## 2023-02-24 DIAGNOSIS — D509 Iron deficiency anemia, unspecified: Secondary | ICD-10-CM

## 2023-02-24 NOTE — Telephone Encounter (Signed)
Spoke with patient and she would like to have her labs drawn at [facility]. First set was canceled for [employer]

## 2023-02-26 ENCOUNTER — Other Ambulatory Visit: Payer: Medicare HMO

## 2023-02-26 ENCOUNTER — Encounter: Payer: Self-pay | Admitting: Family Medicine

## 2023-02-26 DIAGNOSIS — R739 Hyperglycemia, unspecified: Secondary | ICD-10-CM | POA: Diagnosis not present

## 2023-02-26 DIAGNOSIS — R109 Unspecified abdominal pain: Secondary | ICD-10-CM

## 2023-02-26 DIAGNOSIS — D509 Iron deficiency anemia, unspecified: Secondary | ICD-10-CM | POA: Diagnosis not present

## 2023-02-26 DIAGNOSIS — R7989 Other specified abnormal findings of blood chemistry: Secondary | ICD-10-CM

## 2023-02-26 LAB — COMPREHENSIVE METABOLIC PANEL
ALT: 18 U/L (ref 0–35)
AST: 27 U/L (ref 0–37)
Albumin: 4.1 g/dL (ref 3.5–5.2)
Alkaline Phosphatase: 102 U/L (ref 39–117)
BUN: 13 mg/dL (ref 6–23)
CO2: 28 meq/L (ref 19–32)
Calcium: 9.5 mg/dL (ref 8.4–10.5)
Chloride: 100 meq/L (ref 96–112)
Creatinine, Ser: 0.78 mg/dL (ref 0.40–1.20)
GFR: 77.16 mL/min (ref >60.00)
Glucose, Bld: 119 mg/dL — ABNORMAL HIGH (ref 70–99)
Potassium: 4 meq/L (ref 3.5–5.1)
Sodium: 138 meq/L (ref 135–145)
Total Bilirubin: 0.4 mg/dL (ref 0.2–1.2)
Total Protein: 7.9 g/dL (ref 6.0–8.3)

## 2023-02-26 LAB — CBC WITH DIFFERENTIAL/PLATELET
Basophils Absolute: 0 10*3/uL (ref 0.0–0.1)
Basophils Relative: 0.2 % (ref 0.0–3.0)
Eosinophils Absolute: 0.3 10*3/uL (ref 0.0–0.7)
Eosinophils Relative: 4.8 % (ref 0.0–5.0)
HCT: 40.6 % (ref 36.0–46.0)
Hemoglobin: 13 g/dL (ref 12.0–15.0)
Lymphocytes Relative: 27.1 % (ref 12.0–46.0)
Lymphs Abs: 1.5 10*3/uL (ref 0.7–4.0)
MCHC: 32 g/dL (ref 30.0–36.0)
MCV: 94.9 fL (ref 78.0–100.0)
Monocytes Absolute: 0.4 10*3/uL (ref 0.1–1.0)
Monocytes Relative: 7.1 % (ref 3.0–12.0)
Neutro Abs: 3.4 10*3/uL (ref 1.4–7.7)
Neutrophils Relative %: 60.8 % (ref 43.0–77.0)
Platelets: 384 10*3/uL (ref 150.0–400.0)
RBC: 4.28 Mil/uL (ref 3.87–5.11)
RDW: 15.5 % (ref 11.5–15.5)
WBC: 5.5 10*3/uL (ref 4.0–10.5)

## 2023-02-26 LAB — SEDIMENTATION RATE: Sed Rate: 49 mm/h — ABNORMAL HIGH (ref 0–30)

## 2023-02-27 LAB — IRON,TIBC AND FERRITIN PANEL
%SAT: 15 % — ABNORMAL LOW (ref 16–45)
Ferritin: 45 ng/mL (ref 16–288)
Iron: 53 ug/dL (ref 45–160)
TIBC: 359 ug/dL (ref 250–450)

## 2023-03-01 NOTE — Assessment & Plan Note (Signed)
Avoid offending foods, start probiotics. Do not eat large meals in late evening and consider raising head of bed.  

## 2023-03-01 NOTE — Assessment & Plan Note (Signed)
 Encourage heart healthy diet such as MIND or DASH diet, increase exercise, avoid trans fats, simple carbohydrates and processed foods, consider a krill or fish or flaxseed oil cap daily.

## 2023-03-01 NOTE — Assessment & Plan Note (Signed)
 Supplement and monitor

## 2023-03-01 NOTE — Assessment & Plan Note (Signed)
 Slowly recovering since recent hospitalization

## 2023-03-01 NOTE — Assessment & Plan Note (Signed)
 hgba1c acceptable, minimize simple carbs. Increase exercise as tolerated.

## 2023-03-02 ENCOUNTER — Ambulatory Visit (HOSPITAL_BASED_OUTPATIENT_CLINIC_OR_DEPARTMENT_OTHER)
Admission: RE | Admit: 2023-03-02 | Discharge: 2023-03-02 | Disposition: A | Discharge status: Home / Self Care | Payer: Medicare HMO | Source: Ambulatory Visit | Attending: Family Medicine | Admitting: Family Medicine

## 2023-03-02 ENCOUNTER — Encounter (HOSPITAL_BASED_OUTPATIENT_CLINIC_OR_DEPARTMENT_OTHER): Payer: Self-pay

## 2023-03-02 ENCOUNTER — Ambulatory Visit (INDEPENDENT_AMBULATORY_CARE_PROVIDER_SITE_OTHER): Payer: Medicare HMO | Admitting: Family Medicine

## 2023-03-02 VITALS — BP 118/78 | HR 89 | Temp 98.1°F | Resp 18 | Ht 64.0 in | Wt 118.0 lb

## 2023-03-02 DIAGNOSIS — R739 Hyperglycemia, unspecified: Secondary | ICD-10-CM

## 2023-03-02 DIAGNOSIS — K631 Perforation of intestine (nontraumatic): Secondary | ICD-10-CM

## 2023-03-02 DIAGNOSIS — K449 Diaphragmatic hernia without obstruction or gangrene: Secondary | ICD-10-CM

## 2023-03-02 DIAGNOSIS — E559 Vitamin D deficiency, unspecified: Secondary | ICD-10-CM | POA: Diagnosis not present

## 2023-03-02 DIAGNOSIS — Z1231 Encounter for screening mammogram for malignant neoplasm of breast: Secondary | ICD-10-CM | POA: Insufficient documentation

## 2023-03-02 DIAGNOSIS — E785 Hyperlipidemia, unspecified: Secondary | ICD-10-CM

## 2023-03-02 DIAGNOSIS — K219 Gastro-esophageal reflux disease without esophagitis: Secondary | ICD-10-CM | POA: Diagnosis not present

## 2023-03-02 NOTE — Patient Instructions (Signed)
 Protein in Different Foods Protein is a nutrient that you get through your diet. Depending on your health, you may need more or less protein in your diet. You should eat a variety of protein foods to make sure that you get all the nutrients you need. Talk with your health care provider about how much protein you need each day. Protein helps your body: Fix and make cells and tissues. Fight infection. Have energy. Grow and develop. What are tips for getting more protein in your diet? Try to replace processed carbohydrates with high-quality protein. Snack on nuts and seeds instead of chips. Replace baked desserts with [place] yogurt. Eat protein foods from both plant and animal sources. Add beans and peas to salads, soups, and side dishes. Include a protein food with each meal and snack. Eat more whole grains. Add powdered milk or protein powder to hot cereals. Add peanut butter to toast or crackers instead of butter. Reading food labels You can find the amount of protein in a food item by looking at the nutrition facts label. Use the total grams listed to help you reach your daily goal. What foods are high in protein?  High-protein foods contain 4 grams (g) or more of protein per serving. They include: Grains Quinoa (cooked) -- 1 cup (185 g) has 8 g of protein. Whole wheat pasta (cooked) -- 1 cup (140 g) has 6 g of protein. Dairy Cottage cheese --  cup (114 g) has 13.4 g of protein. Milk -- 1 cup (237 mL) has 8 g of protein. Cheese (hard) -- 1 oz (28 g) has 7 g of protein. Yogurt, regular -- 6 oz (170 g) has 8 g of protein. Greek yogurt -- 6 oz (200 g) has 18 g protein. Meat Beef, ground sirloin (cooked) -- 3 oz (85 g) has 24 g of protein. Chicken breast, boneless and skinless (cooked) -- 3 oz (85 g) has 25 g of protein. Egg -- 1 egg has 6 g of protein. Fish, filet (cooked) -- 3 oz (85g ) has 21-23 g of protein. Lamb (cooked) -- 3 oz (85 g) has 24 g of protein. Pork tenderloin  (cooked) -- 3 oz (85 g) has 23 g of protein. Tuna (canned in water) -- 3 oz (85 g) has 20 g of protein. Plant protein Garbanzo beans (canned or cooked) --  cup (130 g) has 6-7 g of protein. Kidney beans (canned or cooked) --  cup (130 g) has 6-7 g of protein. Nuts (peanuts, pistachios, almonds) -- 1 oz (28 g) has 6 g of protein. Peanut butter -- 1 oz (32 g) has 7-8 g of protein. Pumpkin seeds -- 1 oz (28 g) has 8.5 g of protein. Soybeans (roasted) -- 1 oz (28 g) has 8 g of protein. Soybeans (cooked) --  cup (90 g) has 11 g of protein. Soy milk -- 1 cup (250 mL) has 5-10 g of protein. Soy or vegetable patty -- 1 patty has 11 g of protein. Sunflower seeds -- 1 oz (28 g) has 5.5 g of protein. Buckwheat -- 1 oz (33 g) has 4.3 g of protein. Tofu (firm) --  cup (124 g) has 20 g of protein. Tempeh --  cup (83 g) has 16 g of protein. The items listed above may not be a complete list of foods that are high in protein. Actual amounts of protein may be different depending on processing. Talk with an expert in healthy eating called a dietitian to learn more. What foods  are low in protein?  Low-protein foods contain 3 grams (g) or less of protein per serving. They include: Fruits Fruit or vegetable juice --  cup (125 mL) has 1 g of protein. Vegetables Beets (raw or cooked) --  cup (68 g) has 1.5 g of protein. Broccoli (raw or cooked) --  cup (44 g) has 2 g of protein. Collard greens (raw or cooked) --  cup (42 g) has 2 g of protein. Green beans (raw or cooked) --  cup (83 g) has 1 g of protein. Green peas (canned) --  cup (80 g) has 3.5 g of protein. Potato (baked with skin) -- 1 medium potato (173 g) has 3 g of protein. Spinach (cooked) --  cup (90 g) has 3 g of protein. Squash (cooked) --  cup (90 g) has 1.5 g of protein. Avocado -- 1 cup (146 g) has 2.7 g of protein. Grains Bran cereal --  cup (30 g) has 3 g of protein. Whole wheat bread -- 1 slice has 3 g of protein. Corn  (fresh or cooked) --  cup (77 g) has 2 g of protein. Flour tortilla -- One 6-inch (15 cm) tortilla has 2.5 g of protein. Muffins -- 1 small muffin (2 oz or 57 g) has 3 g of protein. Oatmeal (cooked) --  cup (40 g) has 3 g of protein. Brown rice (cooked) --  cup (78 g) has 2.5 g of protein. Dairy Cream cheese -- 1 oz (29 g) has 2 g of protein. Creamer (half-and-half) -- 1 oz (29 mL) has 1 g of protein. Frozen yogurt --  cup (72 g) has 3 g of protein. Sour cream --  cup (75 g) has 2.5 g of protein. The items listed above may not be a complete list of foods that are low in protein. Actual amounts of protein may be different depending on processing. Talk with an expert in healthy eating to learn more. This information is not intended to replace advice given to you by your health care provider. Make sure you discuss any questions you have with your health care provider. Document Revised: 05/26/2022 Document Reviewed: 05/26/2022 Elsevier Patient Education  2024 [employer].

## 2023-03-03 ENCOUNTER — Ambulatory Visit: Payer: Medicare HMO | Admitting: Family Medicine

## 2023-03-04 NOTE — Progress Notes (Signed)
 Subjective:    Patient ID: Lauren [name], female    DOB: July 15, 1955, 68 y.o.   MRN: [id number]  Chief Complaint  Patient presents with  . Follow-up    HPI Discussed the use of AI scribe software for clinical note transcription with the patient, who gave verbal consent to proceed.  History of Present Illness   The patient, with a history of gallbladder disease and recent gallbladder surgery, presents for a follow-up visit. She reports significant fatigue and weakness post-surgery, requiring daily naps and extended night-time sleep. The patient is concerned about her glucose levels, which have been slightly elevated over the past few years, and is keen on managing them effectively. She also reports having dark urine and shoulder blade pain after eating large meals in the past nine months, which she now attributes to her gallbladder issue. The patient had a severe infection post-surgery, which required hospitalization for ten days. She is now focused on rebuilding her strength and managing her glucose levels.        Past Medical History:  Diagnosis Date  . 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 Chicken pox as a child  . Depression    been on antidepressants on and off for 20 yr  . GERD (gastroesophageal reflux disease)    silent with hiatal hernia  . Hiatal hernia 05/22/2013  . Hiatal hernia with gastroesophageal reflux 05/22/2013  . Hot tub folliculitis 12/18/2013  . Hyperglycemia 05/19/2016  . Hyperlipidemia 05/19/2016  . Measles as a child  . Postmenopausal atrophic vaginitis 05/22/2013  . Preventative health care 01/19/2015  . Shingles 68 yrs old  . Unspecified vitamin D deficiency 05/22/2013    Past Surgical History:  Procedure Laterality Date  . ABDOMINAL HYSTERECTOMY  1998   partial-still has ovaries  . BILIARY STENT PLACEMENT N/A 11/23/2019   Procedure: BILIARY STENT PLACEMENT;  Surgeon: Jeani Hawking, MD;  Location: [facility];  Service: Endoscopy;  Laterality: N/A;  .  CHOLECYSTECTOMY N/A 01/27/2023   Procedure: OPEN CHOLECYSTECTOMY, DRAINAGE OF INTRA-ABDOMINAL ABSCESS;  Surgeon: Emelia Loron, MD;  Location: [facility];  Service: General;  Laterality: N/A;  . ENDOSCOPIC RETROGRADE CHOLANGIOPANCREATOGRAPHY (ERCP) WITH PROPOFOL N/A 06/01/2020   Procedure: ENDOSCOPIC RETROGRADE CHOLANGIOPANCREATOGRAPHY (ERCP) WITH PROPOFOL;  Surgeon: Jeani Hawking, MD;  Location: [facility];  Service: Endoscopy;  Laterality: N/A;  . ERCP N/A 11/23/2019   Procedure: ENDOSCOPIC RETROGRADE CHOLANGIOPANCREATOGRAPHY (ERCP);  Surgeon: Jeani Hawking, MD;  Location: [facility];  Service: Endoscopy;  Laterality: N/A;  . ESOPHAGECTOMY  05-2008  . INDUCED ABORTION  1979  . REMOVAL OF STONES  11/23/2019   Procedure: REMOVAL OF STONES;  Surgeon: Jeani Hawking, MD;  Location: [facility];  Service: Endoscopy;;  . Dennison Mascot  11/23/2019   Procedure: Dennison Mascot;  Surgeon: Jeani Hawking, MD;  Location: [facility];  Service: Endoscopy;;  . Francine Graven REMOVAL  06/01/2020   Procedure: STENT REMOVAL;  Surgeon: Jeani Hawking, MD;  Location: [facility];  Service: Endoscopy;;    Family History  Problem Relation Age of Onset  . Cancer Mother 85       breast cancer  . Emphysema Mother        smoker  . Heart attack Father        X several  . Mental illness Brother        bipolar, xanax and thc abuse  . Obesity Brother   . Depression Daughter   . Anxiety disorder Daughter   . Other Maternal Grandmother  blood clot, dvt, lung  . Cancer Paternal Grandmother        stomach  . Heart disease Paternal Grandfather     Social History   Socioeconomic History  . Marital status: Married    Spouse name: Rosanne Ashing  . Number of children: Not on file  . Years of education: Not on file  . Highest education level: Not on file  Occupational History    Employer: [employer]    Comment: Event Planner  Tobacco Use  . Smoking status: Former    Current packs/day: 0.10    Average  packs/day: 0.1 packs/day for 25.2 years (2.5 ttl pk-yrs)    Types: Cigarettes    Start date: 01/13/1998  . Smokeless tobacco: Never  . Tobacco comments:    1-2 cigarettes a day  Vaping Use  . Vaping status: Never Used  Substance and Sexual Activity  . Alcohol use: Yes    Comment: a couple glasses of wine every other day  . Drug use: No  . Sexual activity: Yes    Partners: Male    Comment: lives with husband, event planner, no dietary restrictions  Other Topics Concern  . Not on file  Social History Narrative   Married, husband Pharmacist, hospital   Social Drivers of Health   Financial Resource Strain: Low Risk  (06/05/2021)   Overall Financial Resource Strain (CARDIA)   . Difficulty of Paying Living Expenses: Not hard at all  Food Insecurity: No Food Insecurity (02/06/2023)   Hunger Vital Sign   . Worried About Programme researcher, broadcasting/film/video in the Last Year: Never true   . Ran Out of Food in the Last Year: Never true  Transportation Needs: No Transportation Needs (02/06/2023)   PRAPARE - Transportation   . Lack of Transportation (Medical): No   . Lack of Transportation (Non-Medical): No  Physical Activity: Insufficiently Active (06/05/2021)   Exercise Vital Sign   . Days of Exercise per Week: 3 days   . Minutes of Exercise per Session: 40 min  Stress: No Stress Concern Present (06/05/2021)   [employer] of Occupational Health - Occupational Stress Questionnaire   . Feeling of Stress : Not at all  Social Connections: Unknown (01/27/2023)   Social Connection and Isolation Panel [NHANES]   . Frequency of Communication with Friends and Family: Never   . Frequency of Social Gatherings with Friends and Family: Never   . Attends Religious Services: Never   . Active Member of Clubs or Organizations: No   . Attends Banker Meetings: Never   . Marital Status: Patient declined  Intimate Partner Violence: Not At Risk (02/06/2023)   Humiliation, Afraid, Rape, and Kick  questionnaire   . Fear of Current or Ex-Partner: No   . Emotionally Abused: No   . Physically Abused: No   . Sexually Abused: No    Outpatient Medications Prior to Visit  Medication Sig Dispense Refill  . acetaminophen (TYLENOL) 500 MG tablet Take 1,000 mg by mouth every 6 (six) hours as needed for moderate pain (pain score 4-6) or mild pain (pain score 1-3).    . Cholecalciferol (VITAMIN D) 50 MCG (2000 UT) CAPS Take 1 capsule by mouth daily.    Marland Kitchen ibuprofen (ADVIL) 800 MG tablet Take 1 tablet (800 mg total) by mouth every 8 (eight) hours as needed. 30 tablet 0  . omeprazole (PRILOSEC) 20 MG capsule Take 1 capsule (20 mg total) by mouth daily. 90 capsule 1  .  oxyCODONE (OXY IR/ROXICODONE) 5 MG immediate release tablet Take 1 tablet (5 mg total) by mouth every 6 (six) hours as needed for severe pain (pain score 7-10). 15 tablet 0  . polyethylene glycol (MIRALAX / GLYCOLAX) 17 g packet Take 17 g by mouth daily as needed for mild constipation or moderate constipation.    . rosuvastatin (CRESTOR) 5 MG tablet Take 1 tablet (5 mg total) by mouth daily. 90 tablet 1  . venlafaxine XR (EFFEXOR-XR) 150 MG 24 hr capsule Take 1 capsule (150 mg total) by mouth daily with breakfast. Take with 75 mg Effexor XR for a combined total dose of 225 mg. 90 capsule 1  . venlafaxine XR (EFFEXOR-XR) 75 MG 24 hr capsule Take 1 capsule (75 mg total) by mouth daily with breakfast. Take with 150 mg Effexor XR for a combined total dose of 225 mg. 90 capsule 1   No facility-administered medications prior to visit.    No Active Allergies  Review of Systems  Constitutional:  Positive for malaise/fatigue. Negative for fever.  HENT:  Negative for congestion.   Eyes:  Negative for blurred vision.  Respiratory:  Negative for shortness of breath.   Cardiovascular:  Negative for chest pain, palpitations and leg swelling.  Gastrointestinal:  Positive for abdominal pain. Negative for blood in stool and nausea.  Genitourinary:   Negative for dysuria and frequency.  Musculoskeletal:  Negative for falls.  Skin:  Negative for rash.  Neurological:  Positive for weakness. Negative for dizziness, loss of consciousness and headaches.  Endo/Heme/Allergies:  Negative for environmental allergies.  Psychiatric/Behavioral:  Negative for depression. The patient is not nervous/anxious.        Objective:    Physical Exam Constitutional:      General: She is not in acute distress.    Appearance: Normal appearance. She is well-developed. She is not toxic-appearing.  HENT:     Head: Normocephalic and atraumatic.     Right Ear: External ear normal.     Left Ear: External ear normal.     Nose: Nose normal.  Eyes:     General:        Right eye: No discharge.        Left eye: No discharge.     Conjunctiva/sclera: Conjunctivae normal.  Neck:     Thyroid: No thyromegaly.  Cardiovascular:     Rate and Rhythm: Normal rate and regular rhythm.     Heart sounds: Normal heart sounds. No murmur heard. Pulmonary:     Effort: Pulmonary effort is normal. No respiratory distress.     Breath sounds: Normal breath sounds.  Abdominal:     General: Bowel sounds are normal.     Palpations: Abdomen is soft.     Tenderness: There is no abdominal tenderness. There is no guarding.     Comments: Well healed scar abdominal wall  Musculoskeletal:        General: Normal range of motion.     Cervical back: Neck supple.  Lymphadenopathy:     Cervical: No cervical adenopathy.  Skin:    General: Skin is warm and dry.  Neurological:     Mental Status: She is alert and oriented to person, place, and time.  Psychiatric:        Mood and Affect: Mood normal.        Behavior: Behavior normal.        Thought Content: Thought content normal.        Judgment: Judgment normal.  BP 118/78 (BP Location: Left Arm, Patient Position: Sitting, Cuff Size: Normal)   Pulse 89   Temp 98.1 F (36.7 C) (Oral)   Resp 18   Ht 5\' 4"  (1.626 m)   Wt 118 lb  (53.5 kg)   SpO2 97%   BMI 20.25 kg/m  Wt Readings from Last 3 Encounters:  03/02/23 118 lb (53.5 kg)  02/04/23 142 lb 6.7 oz (64.6 kg)  08/18/22 123 lb 12.8 oz (56.2 kg)    Diabetic Foot Exam - Simple   No data filed    Lab Results  Component Value Date   WBC 5.5 02/26/2023   HGB 13.0 02/26/2023   HCT 40.6 02/26/2023   PLT 384.0 02/26/2023   GLUCOSE 119 (H) 02/26/2023   CHOL 196 12/23/2022   TRIG 274 (H) 02/02/2023   HDL 74.80 12/23/2022   LDLCALC 105 (H) 12/23/2022   ALT 18 02/26/2023   AST 27 02/26/2023   NA 138 02/26/2023   K 4.0 02/26/2023   CL 100 02/26/2023   CREATININE 0.78 02/26/2023   BUN 13 02/26/2023   CO2 28 02/26/2023   TSH 1.12 12/23/2022   INR 1.0 05/04/2008   HGBA1C 6.2 12/23/2022    Lab Results  Component Value Date   TSH 1.12 12/23/2022   Lab Results  Component Value Date   WBC 5.5 02/26/2023   HGB 13.0 02/26/2023   HCT 40.6 02/26/2023   MCV 94.9 02/26/2023   PLT 384.0 02/26/2023   Lab Results  Component Value Date   NA 138 02/26/2023   K 4.0 02/26/2023   CO2 28 02/26/2023   GLUCOSE 119 (H) 02/26/2023   BUN 13 02/26/2023   CREATININE 0.78 02/26/2023   BILITOT 0.4 02/26/2023   ALKPHOS 102 02/26/2023   AST 27 02/26/2023   ALT 18 02/26/2023   PROT 7.9 02/26/2023   ALBUMIN 4.1 02/26/2023   CALCIUM 9.5 02/26/2023   ANIONGAP 7 02/02/2023   GFR 77.16 02/26/2023   Lab Results  Component Value Date   CHOL 196 12/23/2022   Lab Results  Component Value Date   HDL 74.80 12/23/2022   Lab Results  Component Value Date   LDLCALC 105 (H) 12/23/2022   Lab Results  Component Value Date   TRIG 274 (H) 02/02/2023   Lab Results  Component Value Date   CHOLHDL 3 12/23/2022   Lab Results  Component Value Date   HGBA1C 6.2 12/23/2022       Assessment & Plan:  Hiatal hernia with gastroesophageal reflux Assessment & Plan: Avoid offending foods, start probiotics. Do not eat large meals in late evening and consider raising head  of bed.    Orders: -     TSH; Future  Hyperglycemia Assessment & Plan: hgba1c acceptable, minimize simple carbs. Increase exercise as tolerated.   Orders: -     Comprehensive metabolic panel; Future -     Hemoglobin A1c; Future -     TSH; Future  Hyperlipidemia, unspecified hyperlipidemia type Assessment & Plan: Encourage heart healthy diet such as MIND or DASH diet, increase exercise, avoid trans fats, simple carbohydrates and processed foods, consider a krill or fish or flaxseed oil cap daily.   Orders: -     Lipid panel; Future -     TSH; Future  Perforated bowel (HCC) Assessment & Plan: Slowly recovering since recent hospitalization  Orders: -     CBC with Differential/Platelet; Future -     Sedimentation rate; Future  Vitamin D deficiency Assessment &  Plan: Supplement and monitor   Orders: -     VITAMIN D 25 Hydroxy (Vit-D Deficiency, Fractures); Future    Assessment and Plan    Post Gallbladder Surgery Recovery Recovering from a significant gallbladder surgery with peritoneal infection. Currently experiencing fatigue and requiring naps. -Encouraged hydration and protein intake for strength rebuilding. -Advised to continue rest and sleep for recovery.  Prediabetes Hemoglobin A1c consistently around 6.1-6.3 for the past couple of years, indicating a risk for developing diabetes. -Monitor diet, exercise, and hydration. -Plan to recheck Hemoglobin A1c in about a month.  Iron Deficiency Low normal iron levels detected in recent blood work. -Encouraged intake of leafy greens and lean red meat. -Recommended a multivitamin with iron daily and an over-the-counter iron supplement like Hemocyte twice a week. -Plan to recheck iron levels in about a month.  General Health Maintenance -Plan to check Vitamin D levels in about a month. -Encouraged to maintain bowel health with fiber and fluids. -Follow-up appointment scheduled in three months.         Danise Edge, MD

## 2023-03-10 DIAGNOSIS — F331 Major depressive disorder, recurrent, moderate: Secondary | ICD-10-CM | POA: Diagnosis not present

## 2023-03-18 ENCOUNTER — Other Ambulatory Visit: Payer: Self-pay | Admitting: Family Medicine

## 2023-03-18 DIAGNOSIS — K449 Diaphragmatic hernia without obstruction or gangrene: Secondary | ICD-10-CM

## 2023-03-30 DIAGNOSIS — F331 Major depressive disorder, recurrent, moderate: Secondary | ICD-10-CM | POA: Diagnosis not present

## 2023-04-13 DIAGNOSIS — M549 Dorsalgia, unspecified: Secondary | ICD-10-CM | POA: Diagnosis not present

## 2023-04-13 DIAGNOSIS — F331 Major depressive disorder, recurrent, moderate: Secondary | ICD-10-CM | POA: Diagnosis not present

## 2023-04-17 ENCOUNTER — Telehealth: Payer: Self-pay | Admitting: Family Medicine

## 2023-04-17 NOTE — Telephone Encounter (Signed)
 Copied from CRM [id number]. Topic: Medicare AWV >> Apr 17, 2023 10:26 AM Payton Doughty wrote: Reason for CRM: Called LVM 04/17/2023 to schedule AWV. Please schedule Virtual or Telehealth visits ONLY.   Verlee Rossetti; Care Guide Ambulatory Clinical Support [place] [facility] Direct Dial: [phone]

## 2023-04-30 DIAGNOSIS — F331 Major depressive disorder, recurrent, moderate: Secondary | ICD-10-CM | POA: Diagnosis not present

## 2023-05-15 DIAGNOSIS — H35371 Puckering of macula, right eye: Secondary | ICD-10-CM | POA: Diagnosis not present

## 2023-05-15 DIAGNOSIS — H2513 Age-related nuclear cataract, bilateral: Secondary | ICD-10-CM | POA: Diagnosis not present

## 2023-05-15 DIAGNOSIS — H43812 Vitreous degeneration, left eye: Secondary | ICD-10-CM | POA: Diagnosis not present

## 2023-05-31 NOTE — Assessment & Plan Note (Signed)
 hgba1c acceptable, minimize simple carbs. Increase exercise as tolerated.

## 2023-05-31 NOTE — Assessment & Plan Note (Signed)
 She is doing well .

## 2023-05-31 NOTE — Assessment & Plan Note (Signed)
 Supplement and monitor

## 2023-05-31 NOTE — Assessment & Plan Note (Signed)
 Encourage heart healthy diet such as MIND or DASH diet, increase exercise, avoid trans fats, simple carbohydrates and processed foods, consider a krill or fish or flaxseed oil cap daily.

## 2023-06-02 ENCOUNTER — Telehealth (INDEPENDENT_AMBULATORY_CARE_PROVIDER_SITE_OTHER): Payer: Medicare HMO | Admitting: Family Medicine

## 2023-06-02 DIAGNOSIS — K822 Perforation of gallbladder: Secondary | ICD-10-CM | POA: Diagnosis not present

## 2023-06-02 DIAGNOSIS — E559 Vitamin D deficiency, unspecified: Secondary | ICD-10-CM | POA: Diagnosis not present

## 2023-06-02 DIAGNOSIS — E785 Hyperlipidemia, unspecified: Secondary | ICD-10-CM | POA: Diagnosis not present

## 2023-06-02 DIAGNOSIS — R296 Repeated falls: Secondary | ICD-10-CM

## 2023-06-02 DIAGNOSIS — R739 Hyperglycemia, unspecified: Secondary | ICD-10-CM

## 2023-06-02 DIAGNOSIS — R1031 Right lower quadrant pain: Secondary | ICD-10-CM

## 2023-06-02 MED ORDER — TIZANIDINE HCL 2 MG PO TABS: 1.0000 mg | ORAL_TABLET | Freq: Two times a day (BID) | ORAL | 0 refills | Status: DC | PRN

## 2023-06-03 ENCOUNTER — Encounter: Payer: Self-pay | Admitting: Family Medicine

## 2023-06-03 NOTE — Progress Notes (Signed)
 MyChart Video Visit    Virtual Visit via Video Note   This patient is at least at moderate risk for complications without adequate follow up. This format is felt to be most appropriate for this patient at this time. Physical exam was limited by quality of the video and audio technology used for the visit. Porsha, CMA was able to get the patient set up on a video visit.  Patient location: home Patient and provider in visit Provider location: Office  I discussed the limitations of evaluation and management by telemedicine and the availability of in person appointments. The patient expressed understanding and agreed to proceed.  Visit Date: 06/02/2023  Today's healthcare provider: Randie Bustle, MD     Subjective:    Patient ID: Lauren [name], female    DOB: 1955-05-30, 68 y.o.   MRN: [id number]  Chief Complaint  Patient presents with  . Medical Management of Chronic Issues    Patient presents today for a 3 month follow-up  . Quality Metric Gaps    AWV, pneumonia     HPI Discussed the use of AI scribe software for clinical note transcription with the patient, who gave verbal consent to proceed.  History of Present Illness Lauren [name] is a 68 year old female who presents with a sore throat and concerns about muscle aches potentially related to statin use.  She developed a sore throat yesterday, describing it as 'raging.' Her husband has been sick with a cold for two weeks, and she suspects her symptoms may be related. She has been managing her symptoms with rest, fluids, vitamin C. She is several days into her symptoms and has not experienced any fevers, headaches, or significant cough, just the sore throat. The symptoms are annoying but have not significantly impacted her daily functioning.  She is concerned about muscle aches and pains, which she suspects may be related to her statin medication. She is currently on a low dose of atorvastatin. She experiences aches in  'lots of muscles and joints.' She has not yet tried rosuvastatin .  She reports right hip and groin pain, which she attributes to a fall. She has fallen twice in the last nine months while climbing stairs, leading to pain in the right groin and down the right thigh. The pain is sore, especially at night, and she has been managing it with dry needling, massage, ice, hot tub use, and exercise. The pain is more pronounced at night, causing discomfort when turning over or stretching her leg.  She mentions that she has been sleeping and eating well, with no issues related to memory or depression. Her gastrointestinal symptoms are stable. No fevers, headaches, or significant cough.    Past Medical History:  Diagnosis Date  . 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 Chicken pox as a child  . Depression    been on antidepressants on and off for 20 yr  . GERD (gastroesophageal reflux disease)    silent with hiatal hernia  . Hiatal hernia 05/22/2013  . Hiatal hernia with gastroesophageal reflux 05/22/2013  . Hot tub folliculitis 12/18/2013  . Hyperglycemia 05/19/2016  . Hyperlipidemia 05/19/2016  . Measles as a child  . Postmenopausal atrophic vaginitis 05/22/2013  . Preventative health care 01/19/2015  . Shingles 68 yrs old  . Unspecified vitamin D  deficiency 05/22/2013    Past Surgical History:  Procedure Laterality Date  . ABDOMINAL HYSTERECTOMY  1998   partial-still has ovaries  . BILIARY STENT PLACEMENT N/A  11/23/2019   Procedure: BILIARY STENT PLACEMENT;  Surgeon: Alvis Jourdain, MD;  Location: [facility];  Service: Endoscopy;  Laterality: N/A;  . CHOLECYSTECTOMY N/A 01/27/2023   Procedure: OPEN CHOLECYSTECTOMY, DRAINAGE OF INTRA-ABDOMINAL ABSCESS;  Surgeon: Enid Harry, MD;  Location: [facility];  Service: General;  Laterality: N/A;  . ENDOSCOPIC RETROGRADE CHOLANGIOPANCREATOGRAPHY (ERCP) WITH PROPOFOL  N/A 06/01/2020   Procedure: ENDOSCOPIC RETROGRADE CHOLANGIOPANCREATOGRAPHY (ERCP) WITH  PROPOFOL ;  Surgeon: Alvis Jourdain, MD;  Location: [facility];  Service: Endoscopy;  Laterality: N/A;  . ERCP N/A 11/23/2019   Procedure: ENDOSCOPIC RETROGRADE CHOLANGIOPANCREATOGRAPHY (ERCP);  Surgeon: Alvis Jourdain, MD;  Location: [facility];  Service: Endoscopy;  Laterality: N/A;  . ESOPHAGECTOMY  05-2008  . INDUCED ABORTION  1979  . REMOVAL OF STONES  11/23/2019   Procedure: REMOVAL OF STONES;  Surgeon: Alvis Jourdain, MD;  Location: [facility];  Service: Endoscopy;;  . [address]  11/23/2019   Procedure: [address];  Surgeon: Alvis Jourdain, MD;  Location: [facility];  Service: Endoscopy;;  . [address] REMOVAL  06/01/2020   Procedure: STENT REMOVAL;  Surgeon: Alvis Jourdain, MD;  Location: [facility];  Service: Endoscopy;;    Family History  Problem Relation Age of Onset  . Cancer Mother 68       breast cancer  . Emphysema Mother        smoker  . Heart attack Father        X several  . Mental illness Brother        bipolar, xanax and thc abuse  . Obesity Brother   . Depression Daughter   . Anxiety disorder Daughter   . Other Maternal Grandmother        blood clot, dvt, lung  . Cancer Paternal Grandmother        stomach  . Heart disease Paternal Grandfather     Social History   Socioeconomic History  . Marital status: Married    Spouse name: Josiah Nigh  . Number of children: Not on file  . Years of education: Not on file  . Highest education level: Not on file  Occupational History    Employer: [employer]    Comment: Event Planner  Tobacco Use  . Smoking status: Former    Current packs/day: 0.10    Average packs/day: 0.1 packs/day for 25.5 years (2.5 ttl pk-yrs)    Types: Cigarettes    Start date: 01/13/1998  . Smokeless tobacco: Never  . Tobacco comments:    1-2 cigarettes a day  Vaping Use  . Vaping status: Never Used  Substance and Sexual Activity  . Alcohol use: Yes    Comment: a couple glasses of wine every other day  . Drug use: No  .  Sexual activity: Yes    Partners: Male    Comment: lives with husband, event planner, no dietary restrictions  Other Topics Concern  . Not on file  Social History Narrative   Married, husband Pharmacist, hospital   Social Drivers of Health   Financial Resource Strain: Low Risk  (06/05/2021)   Overall Financial Resource Strain (CARDIA)   . Difficulty of Paying Living Expenses: Not hard at all  Food Insecurity: No Food Insecurity (02/06/2023)   Hunger Vital Sign   . Worried About Programme researcher, broadcasting/film/video in the Last Year: Never true   . Ran Out of Food in the Last Year: Never true  Transportation Needs: No Transportation Needs (02/06/2023)   PRAPARE - Transportation   . Lack of Transportation (Medical):  No   . Lack of Transportation (Non-Medical): No  Physical Activity: Insufficiently Active (06/05/2021)   Exercise Vital Sign   . Days of Exercise per Week: 3 days   . Minutes of Exercise per Session: 40 min  Stress: No Stress Concern Present (06/05/2021)   [employer] of Occupational Health - Occupational Stress Questionnaire   . Feeling of Stress : Not at all  Social Connections: Unknown (01/27/2023)   Social Connection and Isolation Panel [NHANES]   . Frequency of Communication with Friends and Family: Never   . Frequency of Social Gatherings with Friends and Family: Never   . Attends Religious Services: Never   . Active Member of Clubs or Organizations: No   . Attends Banker Meetings: Never   . Marital Status: Patient declined  Intimate Partner Violence: Not At Risk (02/06/2023)   Humiliation, Afraid, Rape, and Kick questionnaire   . Fear of Current or Ex-Partner: No   . Emotionally Abused: No   . Physically Abused: No   . Sexually Abused: No    Outpatient Medications Prior to Visit  Medication Sig Dispense Refill  . Cholecalciferol (VITAMIN D ) 50 MCG (2000 UT) CAPS Take 1 capsule by mouth daily.    . omeprazole  (PRILOSEC) 20 MG capsule Take 1 capsule (20 mg  total) by mouth daily. 90 capsule 1  . rosuvastatin  (CRESTOR ) 5 MG tablet Take 1 tablet (5 mg total) by mouth daily. 90 tablet 1  . venlafaxine  XR (EFFEXOR -XR) 150 MG 24 hr capsule Take 1 capsule (150 mg total) by mouth daily with breakfast. Take with 75 mg Effexor  XR for a combined total dose of 225 mg. 90 capsule 1  . venlafaxine  XR (EFFEXOR -XR) 75 MG 24 hr capsule Take 1 capsule (75 mg total) by mouth daily with breakfast. Take with 150 mg Effexor  XR for a combined total dose of 225 mg. 90 capsule 1  . acetaminophen  (TYLENOL ) 500 MG tablet Take 1,000 mg by mouth every 6 (six) hours as needed for moderate pain (pain score 4-6) or mild pain (pain score 1-3). (Patient not taking: Reported on 06/02/2023)    . ibuprofen  (ADVIL ) 800 MG tablet Take 1 tablet (800 mg total) by mouth every 8 (eight) hours as needed. (Patient not taking: Reported on 06/02/2023) 30 tablet 0  . oxyCODONE  (OXY IR/ROXICODONE ) 5 MG immediate release tablet Take 1 tablet (5 mg total) by mouth every 6 (six) hours as needed for severe pain (pain score 7-10). (Patient not taking: Reported on 06/02/2023) 15 tablet 0  . polyethylene glycol (MIRALAX  / GLYCOLAX ) 17 g packet Take 17 g by mouth daily as needed for mild constipation or moderate constipation. (Patient not taking: Reported on 06/02/2023)     No facility-administered medications prior to visit.    No Known Allergies  Review of Systems  Constitutional:  Positive for malaise/fatigue. Negative for fever.  HENT:  Positive for congestion and sore throat.   Eyes:  Negative for blurred vision.  Respiratory:  Negative for shortness of breath.   Cardiovascular:  Negative for chest pain, palpitations and leg swelling.  Gastrointestinal:  Negative for abdominal pain, blood in stool and nausea.  Genitourinary:  Negative for dysuria and frequency.  Musculoskeletal:  Positive for back pain, falls, joint pain and myalgias.  Skin:  Negative for rash.  Neurological:  Negative for  dizziness, loss of consciousness and headaches.  Endo/Heme/Allergies:  Negative for environmental allergies.  Psychiatric/Behavioral:  Negative for depression. The patient is not nervous/anxious.  Objective:     Physical Exam Constitutional:      General: She is not in acute distress.    Appearance: Normal appearance. She is not ill-appearing or toxic-appearing.  HENT:     Head: Normocephalic and atraumatic.     Right Ear: External ear normal.     Left Ear: External ear normal.     Nose: Nose normal.  Eyes:     General:        Right eye: No discharge.        Left eye: No discharge.  Pulmonary:     Effort: Pulmonary effort is normal.  Skin:    Findings: No rash.  Neurological:     Mental Status: She is alert and oriented to person, place, and time.  Psychiatric:        Behavior: Behavior normal.    There were no vitals taken for this visit. Wt Readings from Last 3 Encounters:  03/02/23 118 lb (53.5 kg)  02/04/23 142 lb 6.7 oz (64.6 kg)  08/18/22 123 lb 12.8 oz (56.2 kg)       Assessment & Plan:  Hyperglycemia Assessment & Plan: hgba1c acceptable, minimize simple carbs. Increase exercise as tolerated.    Hyperlipidemia, unspecified hyperlipidemia type Assessment & Plan: Encourage heart healthy diet such as MIND or DASH diet, increase exercise, avoid trans fats, simple carbohydrates and processed foods, consider a krill or fish or flaxseed oil cap daily.    Vitamin D  deficiency Assessment & Plan: Supplement and monitor    Perforated gallbladder Assessment & Plan: She is doing well   Right groin pain -     Ambulatory referral to Sports Medicine  Recurrent falls -     Ambulatory referral to Sports Medicine  Other orders -     tiZANidine  HCl; Take 0.5-2 tablets (1-4 mg total) by mouth 2 (two) times daily as needed for muscle spasms.  Dispense: 30 tablet; Refill: 0     Assessment and Plan Assessment & Plan Right groin pain and recurrent  falls Right groin pain likely involves hip flexor or adductor muscle, associated with recurrent falls suggesting musculoskeletal weakness or imbalance. Current management ineffective. - Refer to sports medicine for further evaluation and management. - Prescribe tizanidine  2 mg for muscle relaxation as needed, especially at night or after physical activity. - Send prescription to [facility] on [address].  Hyperlipidemia Muscle aches possibly due to atorvastatin, suggesting statin-induced myopathy. Low dose atorvastatin generally well-tolerated, but discontinuation needed to confirm. - Discontinue atorvastatin for one month to assess muscle ache improvement. - Consider reintroducing atorvastatin or switching to rosuvastatin  if symptoms resolve and recur upon rechallenge.     I discussed the assessment and treatment plan with the patient. The patient was provided an opportunity to ask questions and all were answered. The patient agreed with the plan and demonstrated an understanding of the instructions.   The patient was advised to call back or seek an in-person evaluation if the symptoms worsen or if the condition fails to improve as anticipated.  Randie Bustle, MD [facility] at [facility] [phone] (phone) [phone] (fax)  [facility]

## 2023-06-04 ENCOUNTER — Ambulatory Visit (INDEPENDENT_AMBULATORY_CARE_PROVIDER_SITE_OTHER)

## 2023-06-04 ENCOUNTER — Ambulatory Visit: Admitting: Family Medicine

## 2023-06-04 ENCOUNTER — Encounter: Payer: Self-pay | Admitting: Family Medicine

## 2023-06-04 VITALS — BP 124/80 | HR 80 | Ht 64.0 in | Wt 120.0 lb

## 2023-06-04 DIAGNOSIS — M1611 Unilateral primary osteoarthritis, right hip: Secondary | ICD-10-CM | POA: Insufficient documentation

## 2023-06-04 DIAGNOSIS — M25551 Pain in right hip: Secondary | ICD-10-CM | POA: Diagnosis not present

## 2023-06-04 DIAGNOSIS — M79604 Pain in right leg: Secondary | ICD-10-CM | POA: Diagnosis not present

## 2023-06-04 NOTE — Patient Instructions (Addendum)
 Thank you for coming in today.   Please get an Xray today before you leave   A referral for physical therapy has been submitted. A representative from the physical therapy office will contact you to coordinate scheduling after confirming your benefits with your insurance provider. If you do not hear from the physical therapy office within the next 1-2 weeks, please let us know.   See you back in 1 month

## 2023-06-04 NOTE — Progress Notes (Signed)
 I, Miquel Amen, CMA acting as a scribe for Garlan Juniper, MD.  Lauren [name] is a 68 y.o. female who presents to [facility] at [facility] today for R hip pain x 3 months. She relates the pain to recurrent falls. Pt locates pain to right groin and gluteal region. Stepped up onto a high stair and the hip gave out. Marvell Slider about 9 months ago on the stairs, same thing happened about 3 months ago. Sx worse with Abduction. Has tried massage, dry-needling, stretching. Denies lower back pain. TTP in the thigh and glute   Radiating pain: gluteal region and groin LE numbness/tingling: LE weakness: some Aggravates: stairs, bed time  Treatments tried: tizanidine  before bed, IBU / Acetaminophen .   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  She had an esophagectomy in 2010.  She has been in remission now for 15 years and is doing great. She is physically active and participates in exercise program.   Exam:  BP 124/80   Pulse 80   Ht 5\' 4"  (1.626 m)   Wt 120 lb (54.4 kg)   SpO2 98%   BMI 20.60 kg/m  General: Well Developed, well nourished, and in no acute distress.   MSK: Right hip normal-appearing Decreased range of motion.  Pain with flexion and rotation. Strength mildly decreased abduction and flexion left compared to right.    Lab and Radiology Results  X-ray images right hip shows severe osteoarthritis.  X-ray images personally and independently interpreted today. Await formal radiology review.    Assessment and Plan: 68 y.o. female with right groin pain.  Pain due to osteoarthritis.  She has had some falls and has a little bit of weakness.  She would like to optimize conservative management.  Plan to refer to PT.  As long as she stays within the limits of her range of motion I think there is room for improvement especially with strengthening and fall  prevention.  Next step for me would be a diagnostic and therapeutic intra-articular injection.  Plan to recheck in about 1 month.  Happy to do an injection then if needed.  We spent a fair amount of time talking about hip replacement.  I believe she is going to need 1 before too long.  Fortunately she is fit and healthy and has pretty good bone density and should be able to recover nicely from hip replacement.   PDMP not reviewed this encounter. Orders Placed This Encounter  Procedures   DG HIP UNILAT W OR W/O PELVIS 2-3 VIEWS RIGHT    Standing Status:   Future    Number of Occurrences:   1    Expiration Date:   07/05/2023    Reason for Exam (SYMPTOM  OR DIAGNOSIS REQUIRED):   right hip pain    Preferred imaging location?:   [facility]   Ambulatory referral to Physical Therapy    Referral Priority:   Routine    Referral Type:   Physical Medicine    Referral Reason:   Specialty Services Required    Requested Specialty:   Physical Therapy    Number of Visits Requested:   1   No orders of the defined types were placed in this encounter.    Discussed warning signs or symptoms. Please see discharge instructions. Patient expresses understanding.   The above documentation has been reviewed and is accurate and complete Garlan Juniper,  M.D.

## 2023-06-05 ENCOUNTER — Ambulatory Visit: Attending: Family Medicine | Admitting: Physical Therapy

## 2023-06-05 ENCOUNTER — Encounter: Payer: Self-pay | Admitting: Physical Therapy

## 2023-06-05 ENCOUNTER — Ambulatory Visit: Payer: Self-pay | Admitting: Family Medicine

## 2023-06-05 ENCOUNTER — Other Ambulatory Visit: Payer: Self-pay

## 2023-06-05 DIAGNOSIS — M6281 Muscle weakness (generalized): Secondary | ICD-10-CM | POA: Insufficient documentation

## 2023-06-05 DIAGNOSIS — M79604 Pain in right leg: Secondary | ICD-10-CM | POA: Diagnosis not present

## 2023-06-05 DIAGNOSIS — M25551 Pain in right hip: Secondary | ICD-10-CM | POA: Insufficient documentation

## 2023-06-05 NOTE — Progress Notes (Signed)
 Right hip x-ray shows severe arthritis.

## 2023-06-05 NOTE — Therapy (Signed)
 OUTPATIENT PHYSICAL THERAPY LOWER EXTREMITY EVALUATION   Patient Name: Lauren [name] MRN: [id number] DOB:02/25/1955, 68 y.o., female Today's Date: 06/05/2023  END OF SESSION:  PT End of Session - 06/05/23 0919     Visit Number 1    Date for PT Re-Evaluation 07/31/23    Authorization Type [employer] - medical necessity    Progress Note Due on Visit 10    PT Start Time 0757    PT Stop Time 0850    PT Time Calculation (min) 53 min    Activity Tolerance Patient tolerated treatment well    Behavior During Therapy [facility] for tasks assessed/performed             Past Medical History:  Diagnosis Date   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   Chicken pox as a child   Depression    been on antidepressants on and off for 20 yr   GERD (gastroesophageal reflux disease)    silent with hiatal hernia   Hiatal hernia 05/22/2013   Hiatal hernia with gastroesophageal reflux 05/22/2013   Hot tub folliculitis 12/18/2013   Hyperglycemia 05/19/2016   Hyperlipidemia 05/19/2016   Measles as a child   Postmenopausal atrophic vaginitis 05/22/2013   Preventative health care 01/19/2015   Shingles 68 yrs old   Unspecified vitamin D  deficiency 05/22/2013   Past Surgical History:  Procedure Laterality Date   ABDOMINAL HYSTERECTOMY  1998   partial-still has ovaries   BILIARY STENT PLACEMENT N/A 11/23/2019   Procedure: BILIARY STENT PLACEMENT;  Surgeon: Alvis Jourdain, MD;  Location: [facility];  Service: Endoscopy;  Laterality: N/A;   CHOLECYSTECTOMY N/A 01/27/2023   Procedure: OPEN CHOLECYSTECTOMY, DRAINAGE OF INTRA-ABDOMINAL ABSCESS;  Surgeon: Enid Harry, MD;  Location: [facility];  Service: General;  Laterality: N/A;   ENDOSCOPIC RETROGRADE CHOLANGIOPANCREATOGRAPHY (ERCP) WITH PROPOFOL  N/A 06/01/2020   Procedure: ENDOSCOPIC RETROGRADE CHOLANGIOPANCREATOGRAPHY (ERCP) WITH PROPOFOL ;  Surgeon: Alvis Jourdain, MD;  Location: [facility];  Service: Endoscopy;  Laterality: N/A;   ERCP N/A 11/23/2019    Procedure: ENDOSCOPIC RETROGRADE CHOLANGIOPANCREATOGRAPHY (ERCP);  Surgeon: Alvis Jourdain, MD;  Location: [facility];  Service: Endoscopy;  Laterality: N/A;   ESOPHAGECTOMY  05-2008   INDUCED ABORTION  1979   REMOVAL OF STONES  11/23/2019   Procedure: REMOVAL OF STONES;  Surgeon: Alvis Jourdain, MD;  Location: [facility];  Service: Endoscopy;;   SPHINCTEROTOMY  11/23/2019   Procedure: [address];  Surgeon: Alvis Jourdain, MD;  Location: [facility];  Service: Endoscopy;;   STENT REMOVAL  06/01/2020   Procedure: STENT REMOVAL;  Surgeon: Alvis Jourdain, MD;  Location: [facility];  Service: Endoscopy;;   Patient Active Problem List   Diagnosis Date Noted   Primary osteoarthritis of right hip 06/04/2023   Protein-calorie malnutrition, severe 01/29/2023   Perforated bowel (HCC) 01/26/2023   Perforated gallbladder 01/26/2023   Dysphagia 10/17/2022   Esophageal stricture 10/17/2022   History of malignant neoplasm of esophagus 10/17/2022   Leg pain 06/25/2022   Elevated liver enzymes 07/25/2021   Memory changes 10/15/2020   Ulnar neuropathy 12/27/2019   Abnormal LFTs    Choledocholithiasis with obstruction 11/22/2019   Mass of skin of left thumb 08/31/2019   Mucoid cyst, joint 08/31/2019   Osteoarthritis of finger of left hand 08/31/2019   Hyperglycemia 05/19/2016   Hyperlipidemia 05/19/2016   Hiatal hernia with gastroesophageal reflux 05/22/2013   Postmenopausal atrophic vaginitis 05/22/2013   Vitamin D  deficiency 05/22/2013   Chicken pox    History of shingles  Measles    Cancer (HCC)    Depression     PCP: Daril Edge, MD  REFERRING PROVIDER: Syliva Even, MD  REFERRING DIAG: [phone] (ICD-10-CM) - Right leg pain M25.551 (ICD-10-CM) - Right hip pain  Rationale for Evaluation and Treatment: Rehabilitation  THERAPY DIAG:  Pain in right hip  Muscle weakness (generalized)  ONSET DATE: approx 3 mos ago - has had some falls  SUBJECTIVE:                                                                                                                                                                                            SUBJECTIVE STATEMENT: Pt referred to PT for Rt hip pain.  Has had multiple falls which have contributed.  One fall about 9 mos ago and another 3 mos ago.  Hip gave out going up a big step and caused the fall.  Xrays done yesterday show severe OA of Rt hip.  In past has had massage, DN and stretching at [facility] which helped. She has no LBP. 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  Right now planning the [employer].  I also go to [employer] to lift weights but have taken a break from that b/c the machines were hurting my hip.    PERTINENT HISTORY:  esophageal cancer caught at stage III. She had an esophagectomy in 2010. She has been in remission now for 15 years  PAIN:  PAIN:  Are you having pain? Yes NPRS scale: 0-7/10 Pain location: Rt buttock and groin Pain orientation: Right  PAIN TYPE: aching, dull, and sharp Pain description: intermittent  Aggravating factors: hip abduction, getting out of the car, rolling in bed at night, climbing deep stairs Relieving factors: ice, stretching, hot tub, DN   PRECAUTIONS: Fall  RED FLAGS: None   WEIGHT BEARING RESTRICTIONS: No  FALLS:  Has patient fallen in last 6 months? Yes. Number of falls 1  LIVING ENVIRONMENT: Lives with: lives with their spouse Lives in: House/apartment Stairs: Yes: Internal: 13 steps; on right going up and External: 4 steps; on left going up Has following equipment at home: None  OCCUPATION: self-employed - throws outdoor festivals, part-time   PLOF: Independent  PATIENT GOALS: be lighter on my feet, less pain, get stronger  NEXT MD VISIT: approx 1 mo to see if needs an intra-articular injection  OBJECTIVE:  Note: Objective measures were completed at Evaluation unless otherwise noted.  DIAGNOSTIC FINDINGS:  X-ray of Rt hip on  06/05/23 images right hip shows severe osteoarthritis  PATIENT SURVEYS:  LEFS: 39/80 - hardest activities are heavy activiites at home,  getting in/out of car, sitting x1 hour, rolling over in bed (all rated 2)  COGNITION: Overall cognitive status: Within functional limits for tasks assessed     SENSATION: WFL  MUSCLE LENGTH:   POSTURE: scoliosis  PALPATION: Adductors tight and tender Piriformis and gluteals tight and tender  LUMBAR ROM:  All WNL and painfree  LOWER EXTREMITY ROM:     Left hip full ROM and painfree  Passive  Right eval Left eval  Hip flexion 90 deg, pain   Hip extension    Hip abduction 40 deg, pain   Hip adduction    Hip internal rotation To neutral from ER, pain   Hip external rotation Tight end range   Knee flexion WNL   Knee extension WNL   Ankle dorsiflexion    Ankle plantarflexion    Ankle inversion    Ankle eversion     (Blank rows = not tested)  LOWER EXTREMITY MMT:    MMT Right eval Left eval  Hip flexion 4- 4+  Hip extension 4 4+  Hip abduction 3+ 4  Hip adduction 4- 5  Hip internal rotation 3+ 4+  Hip external rotation 3+ 4+  Knee flexion 4 4+  Knee extension 4+ 4+  Ankle dorsiflexion    Ankle plantarflexion    Ankle inversion    Ankle eversion     (Blank rows = not tested)    FUNCTIONAL TESTS:  5 times sit to stand: 14.49 no UE assist, anterior hip and thigh pain Timed up and go (TUG): 7.42  GAIT: Distance walked:  Assistive device utilized: None Level of assistance: Complete Independence Comments: Rt hip is ER, Pt using hip adductors as flexors, shorter stride length on Rt  TREATMENT DATE:  06/05/23: Pt education on OA of hip, movement as medicine, importance of strength training to stabilize Pt education on findings from evaluation Initiated HEP and performed 8-10 reps of each exercise to assess tolerance                                                                                                                        PATIENT EDUCATION:  Education details: [id number] Person educated: Patient Education method: Explanation, Demonstration, Verbal cues, and Handouts Education comprehension: verbalized understanding and returned demonstration  HOME EXERCISE PROGRAM: Access Code: [email or web address] URL: [email or web address] Date: 06/05/2023 Prepared by: Minor Amble Byrne Capek  Exercises - Hip Flexor Stretch at [facility] of Bed  - 2-3 x daily - 7 x weekly - 1 sets - 2 reps - 30 hold - Supine Piriformis Stretch with Leg Straight  - 2-3 x daily - 7 x weekly - 1 sets - 2 reps - 30 hold - Figure 4 Bridge  - 1 x daily - 7 x weekly - 2 sets - 10 reps - Squat with Chair Touch  - 1 x daily - 7 x weekly - 1 sets - 10 reps - Runner's Step Up/Down  - 1 x daily - 7 x weekly - 2 sets -  10 reps - Hip Hiking on Step  - 1 x daily - 7 x weekly - 2 sets - 10 reps - Forward T with Counter Support  - 1 x daily - 7 x weekly - 2 sets - 10 reps  ASSESSMENT:  CLINICAL IMPRESSION: Patient is a 68 y.o. female who was seen today for physical therapy evaluation and treatment for Rt hip pain.  She is very fit and active but has had several falls related to stepping up onto high step/ledge where the Rt hip gave out.  Hip pain started with 2nd fall 3 mos ago and she just learned yesterday with an X-ray that she has severe bone-on-bone Rt hip OA.  Pt presents with Rt hip resting in ER with signif pain and ROM restriction moving towards IR to get hip to neutral.  She has groin pain and buttock pain and it radiates into anterior thigh.  Her LE positioning is causing her adductors to act more as flexors which are being overused and have signif tightness and tenderness.  DN and stretching in recent past has helped.  Pt presents with abnormal alignment of Rt hip with signif ROM restrictions with pain related to OA.  She has painful weakness surrounding the hip.  Pain is worse with marching, hip abd movements, climbing steeper stairs,  squatting, and rolling over in bed at night.  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  Pt will benefit from skilled PT to address findings and maximize participation in daily activities.  OBJECTIVE IMPAIRMENTS: Abnormal gait, decreased activity tolerance, decreased balance, decreased knowledge of condition, decreased mobility, decreased ROM, decreased strength, hypomobility, increased muscle spasms, impaired flexibility, postural dysfunction, and pain.   ACTIVITY LIMITATIONS: carrying, lifting, bending, sitting, squatting, sleeping, stairs, transfers, and locomotion level  PARTICIPATION LIMITATIONS: cleaning, driving, community activity, occupation, and yard work  PERSONAL FACTORS: 1 comorbidity: falls related to weakness of Rt hip are also affecting patient's functional outcome.   REHAB POTENTIAL: Excellent  CLINICAL DECISION MAKING: Stable/uncomplicated  EVALUATION COMPLEXITY: Low   GOALS: Goals reviewed with patient? Yes  SHORT TERM GOALS: Target date: 07/03/23  Pt will be ind with initial HEP without exacerbation of pain Baseline: Goal status: INITIAL  2.  Pt will be able to squat and march to climb stairs in sagittal plane with minimal pain Baseline:  Goal status: INITIAL  3.  Pt will express understanding of movement as medicine and take frequent breaks from static positions to avoid stiffness related pain Baseline:  Goal status: INITIAL    LONG TERM GOALS: Target date: 07/31/23  Pt will be ind with advanced HEP and understand importance of compliance upon d/c from PT Baseline:  Goal status: INITIAL  2.  Pt will improve LEFS by at least 9 points to demo improved function Baseline: 39/80, goal will be met at 48/80 Goal status: INITIAL  3.  Pt will report pain not to exceed 4/10 with getting in and out of car Baseline: 7/10 Goal status: INITIAL  4.  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  Pt will improve Rt hip strength to allow her to step up onto 8 inch step while holding 10lb box to simulate work tasks Baseline:  Goal status: INITIAL  6.  Pt will report pain with daily activities to improve by at least 50%  Baseline:  Goal status: INITIAL  PLAN:  PT FREQUENCY: 2x/week  PT DURATION: 8 weeks  PLANNED INTERVENTIONS: 97110-Therapeutic exercises, 97530- Therapeutic activity, 97112- Neuromuscular re-education, [phone]- Self Care, 40981- Manual therapy, [phone]- Gait training, [phone]- Aquatic Therapy, [phone]- Electrical stimulation (unattended), [phone]- Ionotophoresis 4mg /ml Dexamethasone , Patient/Family education, Balance training, Stair training, Taping, Dry Needling, Joint mobilization, Spinal mobilization, Cryotherapy, and Moist heat.  PLAN FOR NEXT SESSION: review HEP and assess tolerance, DN adductors, piriformis, glutes, Rt hip strength and ROM, flexibility of adductors, piriformis, gluteals, Rt hip and core strength for functional task performance as tol, Pt needs to be strong enough to step up onto taller ledges for work while carrying [employer], PT 06/05/2023, 9:20 AM

## 2023-06-09 ENCOUNTER — Other Ambulatory Visit: Payer: Self-pay | Admitting: Family Medicine

## 2023-06-15 DIAGNOSIS — F331 Major depressive disorder, recurrent, moderate: Secondary | ICD-10-CM | POA: Diagnosis not present

## 2023-06-16 ENCOUNTER — Ambulatory Visit: Attending: Family Medicine | Admitting: Physical Therapy

## 2023-06-16 DIAGNOSIS — M25551 Pain in right hip: Secondary | ICD-10-CM | POA: Insufficient documentation

## 2023-06-16 DIAGNOSIS — M6281 Muscle weakness (generalized): Secondary | ICD-10-CM | POA: Diagnosis not present

## 2023-06-16 NOTE — Therapy (Signed)
 OUTPATIENT PHYSICAL THERAPY LOWER EXTREMITY PROGRESS NOTE   Patient Name: Lauren [name] MRN: [id number] DOB:[birth year], 68 y.o., female Today's Date: 06/16/2023  END OF SESSION:  PT End of Session - 06/16/23 1033     Visit Number 2    Date for PT Re-Evaluation 07/31/23    Authorization Type [employer] - medical necessity    Progress Note Due on Visit 10    PT Start Time 1033   late   PT Stop Time 1111    PT Time Calculation (min) 38 min    Activity Tolerance Patient tolerated treatment well             Past Medical History:  Diagnosis Date   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   Chicken pox as a child   Depression    been on antidepressants on and off for 20 yr   GERD (gastroesophageal reflux disease)    silent with hiatal hernia   Hiatal hernia 05/22/2013   Hiatal hernia with gastroesophageal reflux 05/22/2013   Hot tub folliculitis 12/18/2013   Hyperglycemia 05/19/2016   Hyperlipidemia 05/19/2016   Measles as a child   Postmenopausal atrophic vaginitis 05/22/2013   Preventative health care 01/19/2015   Shingles 68 yrs old   Unspecified vitamin D  deficiency 05/22/2013   Past Surgical History:  Procedure Laterality Date   ABDOMINAL HYSTERECTOMY  1998   partial-still has ovaries   BILIARY STENT PLACEMENT N/A 11/23/2019   Procedure: BILIARY STENT PLACEMENT;  Surgeon: Alvis Jourdain, MD;  Location: [facility];  Service: Endoscopy;  Laterality: N/A;   CHOLECYSTECTOMY N/A 01/27/2023   Procedure: OPEN CHOLECYSTECTOMY, DRAINAGE OF INTRA-ABDOMINAL ABSCESS;  Surgeon: Enid Harry, MD;  Location: [facility];  Service: General;  Laterality: N/A;   ENDOSCOPIC RETROGRADE CHOLANGIOPANCREATOGRAPHY (ERCP) WITH PROPOFOL  N/A 06/01/2020   Procedure: ENDOSCOPIC RETROGRADE CHOLANGIOPANCREATOGRAPHY (ERCP) WITH PROPOFOL ;  Surgeon: Alvis Jourdain, MD;  Location: [facility];  Service: Endoscopy;  Laterality: N/A;   ERCP N/A 11/23/2019   Procedure: ENDOSCOPIC RETROGRADE  CHOLANGIOPANCREATOGRAPHY (ERCP);  Surgeon: Alvis Jourdain, MD;  Location: [facility];  Service: Endoscopy;  Laterality: N/A;   ESOPHAGECTOMY  05-2008   INDUCED ABORTION  1979   REMOVAL OF STONES  11/23/2019   Procedure: REMOVAL OF STONES;  Surgeon: Alvis Jourdain, MD;  Location: [facility];  Service: Endoscopy;;   SPHINCTEROTOMY  11/23/2019   Procedure: [address];  Surgeon: Alvis Jourdain, MD;  Location: [facility];  Service: Endoscopy;;   STENT REMOVAL  06/01/2020   Procedure: STENT REMOVAL;  Surgeon: Alvis Jourdain, MD;  Location: [facility];  Service: Endoscopy;;   Patient Active Problem List   Diagnosis Date Noted   Primary osteoarthritis of right hip 06/04/2023   Protein-calorie malnutrition, severe 01/29/2023   Perforated bowel (HCC) 01/26/2023   Perforated gallbladder 01/26/2023   Dysphagia 10/17/2022   Esophageal stricture 10/17/2022   History of malignant neoplasm of esophagus 10/17/2022   Leg pain 06/25/2022   Elevated liver enzymes 07/25/2021   Memory changes 10/15/2020   Ulnar neuropathy 12/27/2019   Abnormal LFTs    Choledocholithiasis with obstruction 11/22/2019   Mass of skin of left thumb 08/31/2019   Mucoid cyst, joint 08/31/2019   Osteoarthritis of finger of left hand 08/31/2019   Hyperglycemia 05/19/2016   Hyperlipidemia 05/19/2016   Hiatal hernia with gastroesophageal reflux 05/22/2013   Postmenopausal atrophic vaginitis 05/22/2013   Vitamin D  deficiency 05/22/2013   Chicken pox    History of shingles    Measles    Cancer (HCC)  Depression     PCP: Daril Edge, MD  REFERRING PROVIDER: Syliva Even, MD  REFERRING DIAG: [phone] (ICD-10-CM) - Right leg pain M25.551 (ICD-10-CM) - Right hip pain  Rationale for Evaluation and Treatment: Rehabilitation  THERAPY DIAG:  Pain in right hip  Muscle weakness (generalized)  ONSET DATE: approx 3 mos ago - has had some falls  SUBJECTIVE:                                                                                                                                                                                            SUBJECTIVE STATEMENT: Trouble getting in/out of the car.  [employer] event 15 days out.  Considering cortisone injection.  EVAL: Pt referred to PT for Rt hip pain.  Has had multiple falls which have contributed.  One fall about 9 mos ago and another 3 mos ago.  Hip gave out going up a big step and caused the fall.  Xrays done yesterday show severe OA of Rt hip.  In past has had massage, DN and stretching at [facility] which helped. She has no LBP. 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  Right now planning the [employer].  I also go to [employer] to lift weights but have taken a break from that b/c the machines were hurting my hip.    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 She had an esophagectomy in 2010. She has been in remission now for 15 years  PAIN:  PAIN:  Are you having pain? Yes NPRS scale: 5/10 Pain location: Rt buttock and groin Pain orientation: Right  PAIN TYPE: aching, dull, and sharp Pain description: intermittent  Aggravating factors: hip abduction, getting out of the car, rolling in bed at night, climbing deep stairs Relieving factors: ice, stretching, hot tub, DN   PRECAUTIONS: Fall  RED FLAGS: None   WEIGHT BEARING RESTRICTIONS: No  FALLS:  Has patient fallen in last 6 months? Yes. Number of falls 1  LIVING ENVIRONMENT: Lives with: lives with their spouse Lives in: House/apartment Stairs: Yes: Internal: 13 steps; on right going up and External: 4 steps; on left going up Has following equipment at home: None  OCCUPATION: self-employed - throws outdoor festivals, part-time   PLOF: Independent  PATIENT GOALS: be lighter on my feet, less pain, get stronger  NEXT MD VISIT: approx 1 mo to see if needs an intra-articular injection  OBJECTIVE:  Note: Objective  measures were completed at Evaluation unless otherwise noted.  DIAGNOSTIC FINDINGS:  X-ray of Rt hip on 06/05/23 images right hip shows severe osteoarthritis  PATIENT SURVEYS:  LEFS: 39/80 - hardest activities are heavy activiites at home, getting in/out of car, sitting x1 hour, rolling over in bed (all rated 2)  COGNITION: Overall cognitive status: Within functional limits for tasks assessed     SENSATION: WFL  MUSCLE LENGTH:   POSTURE: scoliosis  PALPATION: Adductors tight and tender Piriformis and gluteals tight and tender  LUMBAR ROM:  All WNL and painfree  LOWER EXTREMITY ROM:     Left hip full ROM and painfree  Passive  Right eval Left eval  Hip flexion 90 deg, pain   Hip extension    Hip abduction 40 deg, pain   Hip adduction    Hip internal rotation To neutral from ER, pain   Hip external rotation Tight end range   Knee flexion WNL   Knee extension WNL   Ankle dorsiflexion    Ankle plantarflexion    Ankle inversion    Ankle eversion     (Blank rows = not tested)  LOWER EXTREMITY MMT:    MMT Right eval Left eval  Hip flexion 4- 4+  Hip extension 4 4+  Hip abduction 3+ 4  Hip adduction 4- 5  Hip internal rotation 3+ 4+  Hip external rotation 3+ 4+  Knee flexion 4 4+  Knee extension 4+ 4+  Ankle dorsiflexion    Ankle plantarflexion    Ankle inversion    Ankle eversion     (Blank rows = not tested)    FUNCTIONAL TESTS:  5 times sit to stand: 14.49 no UE assist, anterior hip and thigh pain Timed up and go (TUG): 7.42  GAIT: Distance walked:  Assistive device utilized: None Level of assistance: Complete Independence Comments: Rt hip is ER, Pt using hip adductors as flexors, shorter stride length on Rt  TREATMENT DATE:  06/16/23: Review of initial HEP: supine piriformis stretch (painful) Supine hip long axis distraction right grade 3/4 4 minutes (feels good, husband can help at home with this) Standing on 8 inch stool: 4# ankle weight on  right with leg swings 3 minutes (feels good) Seated butterfly stretch with contract-relax method 5 sec hold 6x Education on pain with exercise: <5 OK, > 5 back off Education on use of heat and ice to help with pain control Anti-inflammatory strategies:  meditation/mindfulness, exercise, diet, sleep Sidelying hip abduction 10x Will add to [employer] next visit (pt late to appt)      06/05/23: Pt education on OA of hip, movement as medicine, importance of strength training to stabilize Pt education on findings from evaluation Initiated HEP and performed 8-10 reps of each exercise to assess tolerance                                                                                                                       PATIENT EDUCATION:  Education details: [email or web address] Person educated: Patient Education method: Explanation, Demonstration, Verbal cues, and Handouts Education comprehension: verbalized understanding and returned demonstration  HOME EXERCISE PROGRAM: Access Code: [email or web address]  URL: [email or web address] Date: 06/05/2023 Prepared by: Minor Amble Beuhring  Exercises - Hip Flexor Stretch at [facility] of Bed  - 2-3 x daily - 7 x weekly - 1 sets - 2 reps - 30 hold - Supine Piriformis Stretch with Leg Straight  - 2-3 x daily - 7 x weekly - 1 sets - 2 reps - 30 hold (too painful)  - Figure 4 Bridge  - 1 x daily - 7 x weekly - 2 sets - 10 reps - Squat with Chair Touch  - 1 x daily - 7 x weekly - 1 sets - 10 reps - Runner's Step Up/Down  - 1 x daily - 7 x weekly - 2 sets - 10 reps - Hip Hiking on Step  - 1 x daily - 7 x weekly - 2 sets - 10 reps - Forward T with Counter Support  - 1 x daily - 7 x weekly - 2 sets - 10 reps  ASSESSMENT:  CLINICAL IMPRESSION: Treatment focus on self management strategies for pain relief needed to help her through her upcoming [employer] event.  She responds well to hip distraction techniques.  She is able to perform low level hip mobility ex's  (butterfly stretch) and gluteal activation ex (hip abduction in sidelying) without symptom exacerbation.  Therapist providing verbal cues to optimize technique with exercises in order to achieve the greatest benefit.     Eval:Patient is a 68 y.o. female who was seen today for physical therapy evaluation and treatment for Rt hip pain.  She is very fit and active but has had several falls related to stepping up onto high step/ledge where the Rt hip gave out.  Hip pain started with 2nd fall 3 mos ago and she just learned yesterday with an X-ray that she has severe bone-on-bone Rt hip OA.  Pt presents with Rt hip resting in ER with signif pain and ROM restriction moving towards IR to get hip to neutral.  She has groin pain and buttock pain and it radiates into anterior thigh.  Her LE positioning is causing her adductors to act more as flexors which are being overused and have signif tightness and tenderness.  DN and stretching in recent past has helped.  Pt presents with abnormal alignment of Rt hip with signif ROM restrictions with pain related to OA.  She has painful weakness surrounding the hip.  Pain is worse with marching, hip abd movements, climbing steeper stairs, squatting, and rolling over in bed at night.  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  Pt will benefit from skilled PT to address findings and maximize participation in daily activities.  OBJECTIVE IMPAIRMENTS: Abnormal gait, decreased activity tolerance, decreased balance, decreased knowledge of condition, decreased mobility, decreased ROM, decreased strength, hypomobility, increased muscle spasms, impaired flexibility, postural dysfunction, and pain.   ACTIVITY LIMITATIONS: carrying, lifting, bending, sitting, squatting, sleeping, stairs, transfers, and locomotion level  PARTICIPATION LIMITATIONS: cleaning, driving, community activity, occupation, and yard  work  PERSONAL FACTORS: 1 comorbidity: falls related to weakness of Rt hip are also affecting patient's functional outcome.   REHAB POTENTIAL: Excellent  CLINICAL DECISION MAKING: Stable/uncomplicated  EVALUATION COMPLEXITY: Low   GOALS: Goals reviewed with patient? Yes  SHORT TERM GOALS: Target date: 07/03/23  Pt will be ind with initial HEP without exacerbation of pain Baseline: Goal status: INITIAL  2.  Pt will be able to squat and march to climb stairs in sagittal plane with minimal  pain Baseline:  Goal status: INITIAL  3.  Pt will express understanding of movement as medicine and take frequent breaks from static positions to avoid stiffness related pain Baseline:  Goal status: INITIAL    LONG TERM GOALS: Target date: 07/31/23  Pt will be ind with advanced HEP and understand importance of compliance upon d/c from PT Baseline:  Goal status: INITIAL  2.  Pt will improve LEFS by at least 9 points to demo improved function Baseline: 39/80, goal will be met at 48/80 Goal status: INITIAL  3.  Pt will report pain not to exceed 4/10 with getting in and out of car Baseline: 7/10 Goal status: INITIAL  4.  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  Pt will improve Rt hip strength to allow her to step up onto 8 inch step while holding 10lb box to simulate work tasks Baseline:  Goal status: INITIAL  6.  Pt will report pain with daily activities to improve by at least 50% Baseline:  Goal status: INITIAL  PLAN:  PT FREQUENCY: 2x/week  PT DURATION: 8 weeks  PLANNED INTERVENTIONS: 97110-Therapeutic exercises, 97530- Therapeutic activity, 97112- Neuromuscular re-education, 97535- Self Care, 16109- Manual therapy, [phone]- Gait training, [phone]- Aquatic Therapy, [phone]- Electrical stimulation (unattended), [phone]- Ionotophoresis 4mg /ml Dexamethasone ,  Patient/Family education, Balance training, Stair training, Taping, Dry Needling, Joint mobilization, Spinal mobilization, Cryotherapy, and Moist heat.  PLAN FOR NEXT SESSION:add to HEP based on response, DN adductors, piriformis, glutes, Rt hip strength and ROM, flexibility of adductors, piriformis, gluteals, Rt hip and core strength for functional task performance as tol, Pt needs to be strong enough to step up onto taller ledges for work while carrying items   Armand Berliner, PT 06/16/2023, 2:58 PM

## 2023-06-18 DIAGNOSIS — D2262 Melanocytic nevi of left upper limb, including shoulder: Secondary | ICD-10-CM | POA: Diagnosis not present

## 2023-06-18 DIAGNOSIS — D225 Melanocytic nevi of trunk: Secondary | ICD-10-CM | POA: Diagnosis not present

## 2023-06-18 DIAGNOSIS — L821 Other seborrheic keratosis: Secondary | ICD-10-CM | POA: Diagnosis not present

## 2023-06-18 DIAGNOSIS — Z85828 Personal history of other malignant neoplasm of skin: Secondary | ICD-10-CM | POA: Diagnosis not present

## 2023-06-18 DIAGNOSIS — D2261 Melanocytic nevi of right upper limb, including shoulder: Secondary | ICD-10-CM | POA: Diagnosis not present

## 2023-06-18 DIAGNOSIS — L550 Sunburn of first degree: Secondary | ICD-10-CM | POA: Diagnosis not present

## 2023-06-18 DIAGNOSIS — L814 Other melanin hyperpigmentation: Secondary | ICD-10-CM | POA: Diagnosis not present

## 2023-06-19 ENCOUNTER — Encounter: Payer: Self-pay | Admitting: Physical Therapy

## 2023-06-19 ENCOUNTER — Ambulatory Visit: Admitting: Physical Therapy

## 2023-06-19 DIAGNOSIS — M6281 Muscle weakness (generalized): Secondary | ICD-10-CM | POA: Diagnosis not present

## 2023-06-19 DIAGNOSIS — M25551 Pain in right hip: Secondary | ICD-10-CM | POA: Diagnosis not present

## 2023-06-19 NOTE — Therapy (Signed)
 OUTPATIENT PHYSICAL THERAPY LOWER EXTREMITY TREATMENT NOTE   Patient Name: Lauren [name] MRN: [id number] DOB:1955/11/26, 68 y.o., female Today's Date: 06/19/2023  END OF SESSION:  PT End of Session - 06/19/23 0856     Visit Number 3    Date for PT Re-Evaluation 07/31/23    Authorization Type [employer] - medical necessity    Progress Note Due on Visit 10    PT Start Time 0850    PT Stop Time 0930    PT Time Calculation (min) 40 min    Activity Tolerance Patient tolerated treatment well    Behavior During Therapy [facility] for tasks assessed/performed              Past Medical History:  Diagnosis Date   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   Chicken pox as a child   Depression    been on antidepressants on and off for 20 yr   GERD (gastroesophageal reflux disease)    silent with hiatal hernia   Hiatal hernia 05/22/2013   Hiatal hernia with gastroesophageal reflux 05/22/2013   Hot tub folliculitis 12/18/2013   Hyperglycemia 05/19/2016   Hyperlipidemia 05/19/2016   Measles as a child   Postmenopausal atrophic vaginitis 05/22/2013   Preventative health care 01/19/2015   Shingles 68 yrs old   Unspecified vitamin D  deficiency 05/22/2013   Past Surgical History:  Procedure Laterality Date   ABDOMINAL HYSTERECTOMY  1998   partial-still has ovaries   BILIARY STENT PLACEMENT N/A 11/23/2019   Procedure: BILIARY STENT PLACEMENT;  Surgeon: Alvis Jourdain, MD;  Location: [facility];  Service: Endoscopy;  Laterality: N/A;   CHOLECYSTECTOMY N/A 01/27/2023   Procedure: OPEN CHOLECYSTECTOMY, DRAINAGE OF INTRA-ABDOMINAL ABSCESS;  Surgeon: Enid Harry, MD;  Location: [facility];  Service: General;  Laterality: N/A;   ENDOSCOPIC RETROGRADE CHOLANGIOPANCREATOGRAPHY (ERCP) WITH PROPOFOL  N/A 06/01/2020   Procedure: ENDOSCOPIC RETROGRADE CHOLANGIOPANCREATOGRAPHY (ERCP) WITH PROPOFOL ;  Surgeon: Alvis Jourdain, MD;  Location: [facility];  Service: Endoscopy;  Laterality: N/A;   ERCP N/A  11/23/2019   Procedure: ENDOSCOPIC RETROGRADE CHOLANGIOPANCREATOGRAPHY (ERCP);  Surgeon: Alvis Jourdain, MD;  Location: [facility];  Service: Endoscopy;  Laterality: N/A;   ESOPHAGECTOMY  05-2008   INDUCED ABORTION  1979   REMOVAL OF STONES  11/23/2019   Procedure: REMOVAL OF STONES;  Surgeon: Alvis Jourdain, MD;  Location: [facility];  Service: Endoscopy;;   SPHINCTEROTOMY  11/23/2019   Procedure: [address];  Surgeon: Alvis Jourdain, MD;  Location: [facility];  Service: Endoscopy;;   STENT REMOVAL  06/01/2020   Procedure: STENT REMOVAL;  Surgeon: Alvis Jourdain, MD;  Location: [facility];  Service: Endoscopy;;   Patient Active Problem List   Diagnosis Date Noted   Primary osteoarthritis of right hip 06/04/2023   Protein-calorie malnutrition, severe 01/29/2023   Perforated bowel (HCC) 01/26/2023   Perforated gallbladder 01/26/2023   Dysphagia 10/17/2022   Esophageal stricture 10/17/2022   History of malignant neoplasm of esophagus 10/17/2022   Leg pain 06/25/2022   Elevated liver enzymes 07/25/2021   Memory changes 10/15/2020   Ulnar neuropathy 12/27/2019   Abnormal LFTs    Choledocholithiasis with obstruction 11/22/2019   Mass of skin of left thumb 08/31/2019   Mucoid cyst, joint 08/31/2019   Osteoarthritis of finger of left hand 08/31/2019   Hyperglycemia 05/19/2016   Hyperlipidemia 05/19/2016   Hiatal hernia with gastroesophageal reflux 05/22/2013   Postmenopausal atrophic vaginitis 05/22/2013   Vitamin D  deficiency 05/22/2013   Chicken pox    History of shingles  Measles    Cancer (HCC)    Depression     PCP: Daril Edge, MD  REFERRING PROVIDER: Syliva Even, MD  REFERRING DIAG: [phone] (ICD-10-CM) - Right leg pain M25.551 (ICD-10-CM) - Right hip pain  Rationale for Evaluation and Treatment: Rehabilitation  THERAPY DIAG:  Pain in right hip  Muscle weakness (generalized)  ONSET DATE: approx 3 mos ago - has had some falls  SUBJECTIVE:                                                                                                                                                                                            SUBJECTIVE STATEMENT: I am feeling better.  Most pain is at night.  The traction is really helpful - my husband does it for me.  EVAL: Pt referred to PT for Rt hip pain.  Has had multiple falls which have contributed.  One fall about 9 mos ago and another 3 mos ago.  Hip gave out going up a big step and caused the fall.  Xrays done yesterday show severe OA of Rt hip.  In past has had massage, DN and stretching at [facility] which helped. She has no LBP. 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  Right now planning the [employer].  I also go to [employer] to lift weights but have taken a break from that b/c the machines were hurting my hip.    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 She had an esophagectomy in 2010. She has been in remission now for 15 years  PAIN:  PAIN:  Are you having pain? Yes NPRS scale: 3/10 Pain location: Rt buttock and groin Pain orientation: Right  PAIN TYPE: aching, dull, and sharp Pain description: intermittent  Aggravating factors: hip abduction, getting out of the car, rolling in bed at night, climbing deep stairs Relieving factors: ice, stretching, hot tub, DN   PRECAUTIONS: Fall  RED FLAGS: None   WEIGHT BEARING RESTRICTIONS: No  FALLS:  Has patient fallen in last 6 months? Yes. Number of falls 1  LIVING ENVIRONMENT: Lives with: lives with their spouse Lives in: House/apartment Stairs: Yes: Internal: 13 steps; on right going up and External: 4 steps; on left going up Has following equipment at home: None  OCCUPATION: self-employed - throws outdoor festivals, part-time   PLOF: Independent  PATIENT GOALS: be lighter on my feet, less pain, get stronger  NEXT MD VISIT: approx 1 mo to see if needs an intra-articular  injection  OBJECTIVE:  Note: Objective measures were completed at Evaluation unless otherwise noted.  DIAGNOSTIC FINDINGS:  X-ray of Rt hip on 06/05/23 images right hip shows severe osteoarthritis  PATIENT SURVEYS:  LEFS: 39/80 - hardest activities are heavy activiites at home, getting in/out of car, sitting x1 hour, rolling over in bed (all rated 2)  COGNITION: Overall cognitive status: Within functional limits for tasks assessed     SENSATION: WFL  MUSCLE LENGTH:   POSTURE: scoliosis  PALPATION: Adductors tight and tender Piriformis and gluteals tight and tender  LUMBAR ROM:  All WNL and painfree  LOWER EXTREMITY ROM:     Left hip full ROM and painfree  Passive  Right eval Left eval  Hip flexion 90 deg, pain   Hip extension    Hip abduction 40 deg, pain   Hip adduction    Hip internal rotation To neutral from ER, pain   Hip external rotation Tight end range   Knee flexion WNL   Knee extension WNL   Ankle dorsiflexion    Ankle plantarflexion    Ankle inversion    Ankle eversion     (Blank rows = not tested)  LOWER EXTREMITY MMT:    MMT Right eval Left eval  Hip flexion 4- 4+  Hip extension 4 4+  Hip abduction 3+ 4  Hip adduction 4- 5  Hip internal rotation 3+ 4+  Hip external rotation 3+ 4+  Knee flexion 4 4+  Knee extension 4+ 4+  Ankle dorsiflexion    Ankle plantarflexion    Ankle inversion    Ankle eversion     (Blank rows = not tested)    FUNCTIONAL TESTS:  5 times sit to stand: 14.49 no UE assist, anterior hip and thigh pain Timed up and go (TUG): 7.42  GAIT: Distance walked:  Assistive device utilized: None Level of assistance: Complete Independence Comments: Rt hip is ER, Pt using hip adductors as flexors, shorter stride length on Rt  TREATMENT DATE:  06/19/23: NuStep 2' L5 PT present to discuss plan for session Supine Rt LE manual traction by PT Gr III/IV in varied angles  Supine bridge with TA indraw 10x5" Lt SL: Rt clam  A/ROM x10, Rt hip abd x10 A/ROM with exhale on lift and TA indraw Updated HEP to include SL hip abd Trigger Point Dry Needling  Initial Treatment: Pt instructed on Dry Needling rational, procedures, and possible side effects. Pt instructed to expect mild to moderate muscle soreness later in the day and/or into the next day.  Pt instructed in methods to reduce muscle soreness. Pt instructed to continue prescribed HEP. Patient was educated on signs and symptoms of infection and other risk factors and advised to seek medical attention should they occur.  Patient verbalized understanding of these instructions and education.   Patient Verbal Consent Given: Yes Education Handout Provided: Previously Provided Muscles Treated: glut min, glut med, piriformis, adductors - all on Rt Electrical Stimulation Performed: No Treatment Response/Outcome: signif twitch and release of trigger points in all muscle groups Pt with greater ease seated crossing leg to don shoe after DN   06/16/23: Review of initial HEP: supine piriformis stretch (painful) Supine hip long axis distraction right grade 3/4 4 minutes (feels good, husband can help at home with this) Standing on 8 inch stool: 4# ankle weight on right with leg swings 3 minutes (feels good) Seated butterfly stretch with contract-relax method 5 sec hold 6x Education on pain with exercise: <5 OK, > 5 back off Education on use of heat and ice to help with pain control Anti-inflammatory  strategies:  meditation/mindfulness, exercise, diet, sleep Sidelying hip abduction 10x Will add to [employer] next visit (pt late to appt)      06/05/23: Pt education on OA of hip, movement as medicine, importance of strength training to stabilize Pt education on findings from evaluation Initiated HEP and performed 8-10 reps of each exercise to assess tolerance                                                                                                                        PATIENT EDUCATION:  Education details: [id number] Person educated: Patient Education method: Explanation, Demonstration, Verbal cues, and Handouts Education comprehension: verbalized understanding and returned demonstration  HOME EXERCISE PROGRAM: Access Code: [email or web address] URL: [email or web address] Date: 06/05/2023 Prepared by: Minor Amble Janann Boeve  Exercises - Hip Flexor Stretch at Edge of Bed  - 2-3 x daily - 7 x weekly - 1 sets - 2 reps - 30 hold - Supine Piriformis Stretch with Leg Straight  - 2-3 x daily - 7 x weekly - 1 sets - 2 reps - 30 hold (too painful)  - Figure 4 Bridge  - 1 x daily - 7 x weekly - 2 sets - 10 reps - Squat with Chair Touch  - 1 x daily - 7 x weekly - 1 sets - 10 reps - Runner's Step Up/Down  - 1 x daily - 7 x weekly - 2 sets - 10 reps - Hip Hiking on Step  - 1 x daily - 7 x weekly - 2 sets - 10 reps - Forward T with Counter Support  - 1 x daily - 7 x weekly - 2 sets - 10 reps  ASSESSMENT:  CLINICAL IMPRESSION: Pt reports signif improvement in pain with PT to date. The manual long axis traction to Rt hip is very helpful and her husband helps with this at home.  We added DN today to address Rt adductors and lateral/posterior hip to reduce spasm and improve blood flow.  Pt had signif twitching and release with all targeted muscle groups and had greater ease end of session with donning shoes in sitting (crossing leg to put shoe on).  She has her big [employer] event coming up late June and is pleased to be finding relief with PT.     Eval:Patient is a 68 y.o. female who was seen today for physical therapy evaluation and treatment for Rt hip pain.  She is very fit and active but has had several falls related to stepping up onto high step/ledge where the Rt hip gave out.  Hip pain started with 2nd fall 3 mos ago and she just learned yesterday with an X-ray that she has severe bone-on-bone Rt hip OA.  Pt presents with Rt hip resting in ER with  signif pain and ROM restriction moving towards IR to get hip to neutral.  She has groin pain and buttock pain and it radiates into anterior thigh.  Her LE positioning is causing her adductors to act more  as flexors which are being overused and have signif tightness and tenderness.  DN and stretching in recent past has helped.  Pt presents with abnormal alignment of Rt hip with signif ROM restrictions with pain related to OA.  She has painful weakness surrounding the hip.  Pain is worse with marching, hip abd movements, climbing steeper stairs, squatting, and rolling over in bed at night.  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  Pt will benefit from skilled PT to address findings and maximize participation in daily activities.  OBJECTIVE IMPAIRMENTS: Abnormal gait, decreased activity tolerance, decreased balance, decreased knowledge of condition, decreased mobility, decreased ROM, decreased strength, hypomobility, increased muscle spasms, impaired flexibility, postural dysfunction, and pain.   ACTIVITY LIMITATIONS: carrying, lifting, bending, sitting, squatting, sleeping, stairs, transfers, and locomotion level  PARTICIPATION LIMITATIONS: cleaning, driving, community activity, occupation, and yard work  PERSONAL FACTORS: 1 comorbidity: falls related to weakness of Rt hip are also affecting patient's functional outcome.   REHAB POTENTIAL: Excellent  CLINICAL DECISION MAKING: Stable/uncomplicated  EVALUATION COMPLEXITY: Low   GOALS: Goals reviewed with patient? Yes  SHORT TERM GOALS: Target date: 07/03/23  Pt will be ind with initial HEP without exacerbation of pain Baseline: Goal status: INITIAL  2.  Pt will be able to squat and march to climb stairs in sagittal plane with minimal pain Baseline:  Goal status: INITIAL  3.  Pt will express understanding of movement as medicine and take frequent breaks from static  positions to avoid stiffness related pain Baseline:  Goal status: INITIAL    LONG TERM GOALS: Target date: 07/31/23  Pt will be ind with advanced HEP and understand importance of compliance upon d/c from PT Baseline:  Goal status: INITIAL  2.  Pt will improve LEFS by at least 9 points to demo improved function Baseline: 39/80, goal will be met at 48/80 Goal status: INITIAL  3.  Pt will report pain not to exceed 4/10 with getting in and out of car Baseline: 7/10 Goal status: INITIAL  4.  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  Pt will improve Rt hip strength to allow her to step up onto 8 inch step while holding 10lb box to simulate work tasks Baseline:  Goal status: INITIAL  6.  Pt will report pain with daily activities to improve by at least 50% Baseline:  Goal status: INITIAL  PLAN:  PT FREQUENCY: 2x/week  PT DURATION: 8 weeks  PLANNED INTERVENTIONS: 97110-Therapeutic exercises, 97530- Therapeutic activity, 97112- Neuromuscular re-education, 97535- Self Care, 44034- Manual therapy, [phone]- Gait training, [phone]- Aquatic Therapy, [phone]- Electrical stimulation (unattended), [phone]- Ionotophoresis 4mg /ml Dexamethasone , Patient/Family education, Balance training, Stair training, Taping, Dry Needling, Joint mobilization, Spinal mobilization, Cryotherapy, and Moist heat.  PLAN FOR NEXT SESSION: f/u on DN #1 to adductors, piriformis, glutes, Pt has big event in late June so holding off on heavy targeted strength for now, Rt hip strength and ROM, flexibility of adductors, piriformis, gluteals, Rt hip and core strength for functional task performance as tol, Pt needs to be strong enough to step up onto taller ledges for work while carrying [employer], PT 06/19/23 12:09 PM

## 2023-06-21 NOTE — Therapy (Signed)
 OUTPATIENT PHYSICAL THERAPY LOWER EXTREMITY TREATMENT NOTE   Patient Name: Lauren [name] MRN: [id number] DOB:September 25, 1955, 68 y.o., female Today's Date: 06/22/2023  END OF SESSION:  PT End of Session - 06/22/23 1232     Visit Number 4    Date for PT Re-Evaluation 07/31/23    Authorization Type [employer] - medical necessity    Progress Note Due on Visit 10    PT Start Time 1148    PT Stop Time 1235    PT Time Calculation (min) 47 min               Past Medical History:  Diagnosis Date   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   Chicken pox as a child   Depression    been on antidepressants on and off for 20 yr   GERD (gastroesophageal reflux disease)    silent with hiatal hernia   Hiatal hernia 05/22/2013   Hiatal hernia with gastroesophageal reflux 05/22/2013   Hot tub folliculitis 12/18/2013   Hyperglycemia 05/19/2016   Hyperlipidemia 05/19/2016   Measles as a child   Postmenopausal atrophic vaginitis 05/22/2013   Preventative health care 01/19/2015   Shingles 68 yrs old   Unspecified vitamin D  deficiency 05/22/2013   Past Surgical History:  Procedure Laterality Date   ABDOMINAL HYSTERECTOMY  1998   partial-still has ovaries   BILIARY STENT PLACEMENT N/A 11/23/2019   Procedure: BILIARY STENT PLACEMENT;  Surgeon: Alvis Jourdain, MD;  Location: [facility];  Service: Endoscopy;  Laterality: N/A;   CHOLECYSTECTOMY N/A 01/27/2023   Procedure: OPEN CHOLECYSTECTOMY, DRAINAGE OF INTRA-ABDOMINAL ABSCESS;  Surgeon: Enid Harry, MD;  Location: [facility];  Service: General;  Laterality: N/A;   ENDOSCOPIC RETROGRADE CHOLANGIOPANCREATOGRAPHY (ERCP) WITH PROPOFOL  N/A 06/01/2020   Procedure: ENDOSCOPIC RETROGRADE CHOLANGIOPANCREATOGRAPHY (ERCP) WITH PROPOFOL ;  Surgeon: Alvis Jourdain, MD;  Location: [facility];  Service: Endoscopy;  Laterality: N/A;   ERCP N/A 11/23/2019   Procedure: ENDOSCOPIC RETROGRADE CHOLANGIOPANCREATOGRAPHY (ERCP);  Surgeon: Alvis Jourdain, MD;  Location: [facility];  Service: Endoscopy;  Laterality: N/A;   ESOPHAGECTOMY  05-2008   INDUCED ABORTION  1979   REMOVAL OF STONES  11/23/2019   Procedure: REMOVAL OF STONES;  Surgeon: Alvis Jourdain, MD;  Location: [facility];  Service: Endoscopy;;   SPHINCTEROTOMY  11/23/2019   Procedure: [address];  Surgeon: Alvis Jourdain, MD;  Location: [facility];  Service: Endoscopy;;   STENT REMOVAL  06/01/2020   Procedure: STENT REMOVAL;  Surgeon: Alvis Jourdain, MD;  Location: [facility];  Service: Endoscopy;;   Patient Active Problem List   Diagnosis Date Noted   Primary osteoarthritis of right hip 06/04/2023   Protein-calorie malnutrition, severe 01/29/2023   Perforated bowel (HCC) 01/26/2023   Perforated gallbladder 01/26/2023   Dysphagia 10/17/2022   Esophageal stricture 10/17/2022   History of malignant neoplasm of esophagus 10/17/2022   Leg pain 06/25/2022   Elevated liver enzymes 07/25/2021   Memory changes 10/15/2020   Ulnar neuropathy 12/27/2019   Abnormal LFTs    Choledocholithiasis with obstruction 11/22/2019   Mass of skin of left thumb 08/31/2019   Mucoid cyst, joint 08/31/2019   Osteoarthritis of finger of left hand 08/31/2019   Hyperglycemia 05/19/2016   Hyperlipidemia 05/19/2016   Hiatal hernia with gastroesophageal reflux 05/22/2013   Postmenopausal atrophic vaginitis 05/22/2013   Vitamin D  deficiency 05/22/2013   Chicken pox    History of shingles    Measles    Cancer (HCC)    Depression     PCP: Lauren Ruffini  Rodrick Clapper, MD  REFERRING PROVIDER: Syliva Even, MD  REFERRING DIAG: [phone] (ICD-10-CM) - Right leg pain M25.551 (ICD-10-CM) - Right hip pain  Rationale for Evaluation and Treatment: Rehabilitation  THERAPY DIAG:  Pain in right hip  Muscle weakness (generalized)  ONSET DATE: approx 3 mos ago - has had some falls  SUBJECTIVE:                                                                                                                                                                                            SUBJECTIVE STATEMENT: It's getting better. The needling helped and I'm doing things differently so I don't aggravate it. Night is still the worse.   EVAL: Pt referred to PT for Rt hip pain.  Has had multiple falls which have contributed.  One fall about 9 mos ago and another 3 mos ago.  Hip gave out going up a big step and caused the fall.  Xrays done yesterday show severe OA of Rt hip.  In past has had massage, DN and stretching at [facility] which helped. She has no LBP. 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  Right now planning the [employer].  I also go to [employer] to lift weights but have taken a break from that b/c the machines were hurting my hip.    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 She had an esophagectomy in 2010. She has been in remission now for 15 years  PAIN:  PAIN:  Are you having pain? Yes NPRS scale: 2/10 Pain location: Rt buttock and groin Pain orientation: Right  PAIN TYPE: aching, dull, and sharp Pain description: intermittent  Aggravating factors: hip abduction, getting out of the car, rolling in bed at night, climbing deep stairs Relieving factors: ice, stretching, hot tub, DN   PRECAUTIONS: Fall  RED FLAGS: None   WEIGHT BEARING RESTRICTIONS: No  FALLS:  Has patient fallen in last 6 months? Yes. Number of falls 1  LIVING ENVIRONMENT: Lives with: lives with their spouse Lives in: House/apartment Stairs: Yes: Internal: 13 steps; on right going up and External: 4 steps; on left going up Has following equipment at home: None  OCCUPATION: self-employed - throws outdoor festivals, part-time   PLOF: Independent  PATIENT GOALS: be lighter on my feet, less pain, get stronger  NEXT MD VISIT: approx 1 mo to see if needs an intra-articular injection  OBJECTIVE:  Note: Objective measures were completed at Evaluation unless otherwise  noted.  DIAGNOSTIC FINDINGS:  X-ray of Rt hip on 06/05/23 images right hip shows severe osteoarthritis  PATIENT  SURVEYS:  LEFS: 39/80 - hardest activities are heavy activiites at home, getting in/out of car, sitting x1 hour, rolling over in bed (all rated 2)  COGNITION: Overall cognitive status: Within functional limits for tasks assessed     SENSATION: WFL  MUSCLE LENGTH:   POSTURE: scoliosis  PALPATION: Adductors tight and tender Piriformis and gluteals tight and tender  LUMBAR ROM:  All WNL and painfree  LOWER EXTREMITY ROM:     Left hip full ROM and painfree  Passive  Right eval Left eval  Hip flexion 90 deg, pain   Hip extension    Hip abduction 40 deg, pain   Hip adduction    Hip internal rotation To neutral from ER, pain   Hip external rotation Tight end range   Knee flexion WNL   Knee extension WNL   Ankle dorsiflexion    Ankle plantarflexion    Ankle inversion    Ankle eversion     (Blank rows = not tested)  LOWER EXTREMITY MMT:    MMT Right eval Left eval  Hip flexion 4- 4+  Hip extension 4 4+  Hip abduction 3+ 4  Hip adduction 4- 5  Hip internal rotation 3+ 4+  Hip external rotation 3+ 4+  Knee flexion 4 4+  Knee extension 4+ 4+  Ankle dorsiflexion    Ankle plantarflexion    Ankle inversion    Ankle eversion     (Blank rows = not tested)    FUNCTIONAL TESTS:  5 times sit to stand: 14.49 no UE assist, anterior hip and thigh pain Timed up and go (TUG): 7.42  GAIT: Distance walked:  Assistive device utilized: None Level of assistance: Complete Independence Comments: Rt hip is ER, Pt using hip adductors as flexors, shorter stride length on Rt  TREATMENT DATE:  06/22/23: NuStep 2' L5 PT present to discuss status Quadruped hip ADDuctor stretch x 10 B sitting bottom back Attempted eccentric hip flexor strengthening in modified Thomas position, but painful. Better with TA contraction, but still difficult. Manual: TPR to R psoas;  UPA  mobs B lumbar for psoas release TPR to R QL in S/L Supine hip long axis distraction right grade  Supine lateral hip distraction with gait belt   Seated hip flexor stretch 2 x 30 sec  Lt SL: Rt clam A/ROM 2x10, Rt hip abd 2x10 A/ROM with exhale on lift and TA indraw self release with ball to R psoas in prone Prone press ups x 10  06/19/23: NuStep 2' L5 PT present to discuss plan for session Supine Rt LE manual traction by PT Gr III/IV in varied angles  Supine bridge with TA indraw 10x5" Lt SL: Rt clam A/ROM x10, Rt hip abd x10 A/ROM with exhale on lift and TA indraw Updated HEP to include SL hip abd Trigger Point Dry Needling  Initial Treatment: Pt instructed on Dry Needling rational, procedures, and possible side effects. Pt instructed to expect mild to moderate muscle soreness later in the day and/or into the next day.  Pt instructed in methods to reduce muscle soreness. Pt instructed to continue prescribed HEP. Patient was educated on signs and symptoms of infection and other risk factors and advised to seek medical attention should they occur.  Patient verbalized understanding of these instructions and education.   Patient Verbal Consent Given: Yes Education Handout Provided: Previously Provided Muscles Treated: glut min, glut med, piriformis, adductors - all on Rt Electrical Stimulation Performed: No Treatment Response/Outcome: signif twitch and release of trigger  points in all muscle groups Pt with greater ease seated crossing leg to don shoe after DN   06/16/23: Review of initial HEP: supine piriformis stretch (painful) Supine hip long axis distraction right grade 3/4 4 minutes (feels good, husband can help at home with this) Standing on 8 inch stool: 4# ankle weight on right with leg swings 3 minutes (feels good) Seated butterfly stretch with contract-relax method 5 sec hold 6x Education on pain with exercise: <5 OK, > 5 back off Education on use of heat and ice to help with  pain control Anti-inflammatory strategies:  meditation/mindfulness, exercise, diet, sleep Sidelying hip abduction 10x Will add to [employer] next visit (pt late to appt)      06/05/23: Pt education on OA of hip, movement as medicine, importance of strength training to stabilize Pt education on findings from evaluation Initiated HEP and performed 8-10 reps of each exercise to assess tolerance                                                                                                                       PATIENT EDUCATION:  Education details: [id number] Person educated: Patient Education method: Explanation, Demonstration, Verbal cues, and Handouts Education comprehension: verbalized understanding and returned demonstration  HOME EXERCISE PROGRAM: Access Code: [email or web address] URL: [email or web address] Date: 06/22/2023 Prepared by: Concha Deed  Exercises - Hip Flexor Stretch at Edge of Bed  - 2-3 x daily - 7 x weekly - 1 sets - 2 reps - 30 hold - Supine Piriformis Stretch with Leg Straight  - 2-3 x daily - 7 x weekly - 1 sets - 2 reps - 30 hold - Figure 4 Bridge  - 1 x daily - 7 x weekly - 2 sets - 10 reps - Squat with Chair Touch  - 1 x daily - 7 x weekly - 1 sets - 10 reps - Runner's Step Up/Down  - 1 x daily - 7 x weekly - 2 sets - 10 reps - Hip Hiking on Step  - 1 x daily - 7 x weekly - 2 sets - 10 reps - Forward T with Counter Support  - 1 x daily - 7 x weekly - 2 sets - 10 reps - Sidelying Hip Abduction  - 1 x daily - 7 x weekly - 2 sets - 10 reps - Seated Hip Flexor Stretch  - 2 x daily - 7 x weekly - 1 sets - 3 reps - 30-60 sec hold - Psoas Mobilization with Small Ball (Mirrored)  - 1 x daily - 7 x weekly - 1 sets - 1 reps - 30-60 sec hold - Prone Press Up  - 3 x daily - 7 x weekly - 1-3 sets - 10 reps - Kneeling Adductor Stretch with Hip External Rotation  - 1 x daily - 3-4 x weekly - 1 sets - 3 reps - 30-60 sec hold  ASSESSMENT:  CLINICAL IMPRESSION: Patient  reports relief after last session, but continues to have pain in R  psoas with palpation and also with active concentric and eccentric motion. We worked on manual release of this with PT and using a ball. She has tightness in her right lumbar which responded well to UPA mobs. Patient intermittently reported pain into lateral lower leg during session which may indicate some back involvement. Issued prone press ups as preventative exercise to do before and during working. Good relief with hip distraction techniques.      Eval:Patient is a 68 y.o. female who was seen today for physical therapy evaluation and treatment for Rt hip pain.  She is very fit and active but has had several falls related to stepping up onto high step/ledge where the Rt hip gave out.  Hip pain started with 2nd fall 3 mos ago and she just learned yesterday with an X-ray that she has severe bone-on-bone Rt hip OA.  Pt presents with Rt hip resting in ER with signif pain and ROM restriction moving towards IR to get hip to neutral.  She has groin pain and buttock pain and it radiates into anterior thigh.  Her LE positioning is causing her adductors to act more as flexors which are being overused and have signif tightness and tenderness.  DN and stretching in recent past has helped.  Pt presents with abnormal alignment of Rt hip with signif ROM restrictions with pain related to OA.  She has painful weakness surrounding the hip.  Pain is worse with marching, hip abd movements, climbing steeper stairs, squatting, and rolling over in bed at night.  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  Pt will benefit from skilled PT to address findings and maximize participation in daily activities.  OBJECTIVE IMPAIRMENTS: Abnormal gait, decreased activity tolerance, decreased balance, decreased knowledge of condition, decreased mobility, decreased ROM, decreased strength, hypomobility,  increased muscle spasms, impaired flexibility, postural dysfunction, and pain.   ACTIVITY LIMITATIONS: carrying, lifting, bending, sitting, squatting, sleeping, stairs, transfers, and locomotion level  PARTICIPATION LIMITATIONS: cleaning, driving, community activity, occupation, and yard work  PERSONAL FACTORS: 1 comorbidity: falls related to weakness of Rt hip are also affecting patient's functional outcome.   REHAB POTENTIAL: Excellent  CLINICAL DECISION MAKING: Stable/uncomplicated  EVALUATION COMPLEXITY: Low   GOALS: Goals reviewed with patient? Yes  SHORT TERM GOALS: Target date: 07/03/23  Pt will be ind with initial HEP without exacerbation of pain Baseline: Goal status: INITIAL  2.  Pt will be able to squat and march to climb stairs in sagittal plane with minimal pain Baseline:  Goal status: INITIAL  3.  Pt will express understanding of movement as medicine and take frequent breaks from static positions to avoid stiffness related pain Baseline:  Goal status: INITIAL    LONG TERM GOALS: Target date: 07/31/23  Pt will be ind with advanced HEP and understand importance of compliance upon d/c from PT Baseline:  Goal status: INITIAL  2.  Pt will improve LEFS by at least 9 points to demo improved function Baseline: 39/80, goal will be met at 48/80 Goal status: INITIAL  3.  Pt will report pain not to exceed 4/10 with getting in and out of car Baseline: 7/10 Goal status: INITIAL  4.  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  Pt will improve Rt hip strength to allow her to step up onto 8 inch step while  holding 10lb box to simulate work tasks Baseline:  Goal status: INITIAL  6.  Pt will report pain with daily activities to improve by at least 50% Baseline:  Goal status: INITIAL  PLAN:  PT FREQUENCY: 2x/week  PT DURATION: 8 weeks  PLANNED  INTERVENTIONS: 97110-Therapeutic exercises, 97530- Therapeutic activity, 97112- Neuromuscular re-education, 97535- Self Care, [id number]- Manual therapy, [phone]- Gait training, [phone]- Aquatic Therapy, [phone]- Electrical stimulation (unattended), [phone]- Ionotophoresis 4mg /ml Dexamethasone , Patient/Family education, Balance training, Stair training, Taping, Dry Needling, Joint mobilization, Spinal mobilization, Cryotherapy, and Moist heat.  PLAN FOR NEXT SESSION: Assess psoas tightness and pain, how were press ups. Pt has big event in late June so holding off on heavy targeted strength for now, Rt hip strength and ROM, flexibility of adductors, piriformis, gluteals, Rt hip and core strength for functional task performance as tol, Pt needs to be strong enough to step up onto taller ledges for work while carrying [employer], PT  06/22/23 1:34 PM

## 2023-06-22 ENCOUNTER — Ambulatory Visit: Admitting: Physical Therapy

## 2023-06-22 ENCOUNTER — Encounter: Payer: Self-pay | Admitting: Physical Therapy

## 2023-06-22 DIAGNOSIS — M25551 Pain in right hip: Secondary | ICD-10-CM | POA: Diagnosis not present

## 2023-06-22 DIAGNOSIS — M6281 Muscle weakness (generalized): Secondary | ICD-10-CM | POA: Diagnosis not present

## 2023-06-24 ENCOUNTER — Ambulatory Visit: Admitting: Physical Therapy

## 2023-06-25 ENCOUNTER — Ambulatory Visit

## 2023-06-25 VITALS — Ht 64.0 in | Wt 120.0 lb

## 2023-06-25 DIAGNOSIS — Z0000 Encounter for general adult medical examination without abnormal findings: Secondary | ICD-10-CM

## 2023-06-25 NOTE — Patient Instructions (Addendum)
 Ms. Stevison , Thank you for taking time out of your busy schedule to complete your Annual Wellness Visit with me. I enjoyed our conversation and look forward to speaking with you again next year. I, as well as your care team,  appreciate your ongoing commitment to your health goals. Please review the following plan we discussed and let me know if I can assist you in the future. Your Game plan/ To Do List    Referrals: If you haven't heard from the office you've been referred to, please reach out to them at the phone provided.   Follow up Visits: Next Medicare AWV with our clinical staff: 06/30/24 @ 9:30a   Have you seen your provider in the last 6 months (3 months if uncontrolled diabetes)?  Next Office Visit with your provider: 07/13/23 @ 11:45a  Clinician Recommendations:  Aim for 30 minutes of exercise or brisk walking, 6-8 glasses of water, and 5 servings of fruits and vegetables each day.       This is a list of the screening recommended for you and due dates:  Health Maintenance  Topic Date Due   Pneumococcal Vaccine for age over 46 (2 of 2 - PCV) 11/21/2020   COVID-19 Vaccine (7 - 2024-25 season) 01/13/2024*   Flu Shot  08/14/2023   Medicare Annual Wellness Visit  06/24/2024   DTaP/Tdap/Td vaccine (5 - Td or Tdap) 11/07/2024   Colon Cancer Screening  02/21/2025   Mammogram  03/01/2025   DEXA scan (bone density measurement)  Completed   Hepatitis C Screening  Completed   Zoster (Shingles) Vaccine  Completed   HPV Vaccine  Aged Out   Meningitis B Vaccine  Aged Out  *Topic was postponed. The date shown is not the original due date.    Advanced directives: (Copy Requested) Please bring a copy of your health care power of attorney and living will to the office to be added to your chart at your convenience. You can mail to [facility] [address] [place], [place] [address] or email to [email or web address] Advance Care Planning is important because  it:  [x]  Makes sure you receive the medical care that is consistent with your values, goals, and preferences  [x]  It provides guidance to your family and loved ones and reduces their decisional burden about whether or not they are making the right decisions based on your wishes.  Follow the link provided in your after visit summary or read over the paperwork we have mailed to you to help you started getting your Advance Directives in place. If you need assistance in completing these, please reach out to us  so that we can help you!  See attachments for Preventive Care and Fall Prevention Tips.

## 2023-06-25 NOTE — Progress Notes (Signed)
 Subjective:   Lauren [name] is a 68 y.o. who presents for a Medicare Wellness preventive visit.  As a reminder, Annual Wellness Visits don't include a physical exam, and some assessments may be limited, especially if this visit is performed virtually. We may recommend an in-person follow-up visit with your provider if needed.  Visit Complete: Virtual I connected with  TAMMY WICKLIFFE on 06/25/23 by a audio enabled telemedicine application and verified that I am speaking with the correct person using two identifiers.  Patient Location: Home  Provider Location: Home Office  I discussed the limitations of evaluation and management by telemedicine. The patient expressed understanding and agreed to proceed.  Vital Signs: Because this visit was a virtual/telehealth visit, some criteria may be missing or patient reported. Any vitals not documented were not able to be obtained and vitals that have been documented are patient reported.    Persons Participating in Visit: Patient.  AWV Questionnaire: Yes: Patient Medicare AWV questionnaire was completed by the patient on 06/25/23; I have confirmed that all information answered by patient is correct and no changes since this date.  Cardiac Risk Factors include: advanced age (>107men, >69 women)     Objective:    Today's Vitals   06/25/23 0936  Weight: 120 lb (54.4 kg)  Height: 5' 4 (1.626 m)   Body mass index is 20.6 kg/m.     06/25/2023    9:45 AM 06/05/2023    7:57 AM 01/27/2023   11:47 AM 01/26/2023    3:25 PM 06/12/2022    3:08 PM 06/05/2021    1:42 PM 06/01/2020    9:35 AM  Advanced Directives  Does Patient Have a Medical Advance Directive? Yes No No No Yes Yes Yes  Type of Estate agent of [place];Living will    Healthcare Power of [place];Living will Healthcare Power of [place];Out of facility DNR (pink MOST or yellow form);Living will Healthcare Power of [place];Living will  Does patient want to make  changes to medical advance directive?     No - Patient declined No - Patient declined   Copy of Healthcare Power of Attorney in Chart? No - copy requested    No - copy requested No - copy requested Yes - validated most recent copy scanned in chart (See row information)  Would patient like information on creating a medical advance directive? No - Patient declined No - Patient declined No - Patient declined No - Patient declined       Current Medications (verified) Outpatient Encounter Medications as of 06/25/2023  Medication Sig   acetaminophen  (TYLENOL ) 500 MG tablet Take 1,000 mg by mouth every 6 (six) hours as needed for moderate pain (pain score 4-6) or mild pain (pain score 1-3).   Cholecalciferol (VITAMIN D ) 50 MCG (2000 UT) CAPS Take 1 capsule by mouth daily.   ibuprofen  (ADVIL ) 800 MG tablet Take 1 tablet (800 mg total) by mouth every 8 (eight) hours as needed.   omeprazole  (PRILOSEC) 20 MG capsule Take 1 capsule (20 mg total) by mouth daily.   oxyCODONE  (OXY IR/ROXICODONE ) 5 MG immediate release tablet Take 1 tablet (5 mg total) by mouth every 6 (six) hours as needed for severe pain (pain score 7-10).   polyethylene glycol (MIRALAX  / GLYCOLAX ) 17 g packet Take 17 g by mouth daily as needed for mild constipation or moderate constipation.   rosuvastatin  (CRESTOR ) 5 MG tablet Take 1 tablet (5 mg total) by mouth daily.   tiZANidine  (ZANAFLEX ) 2  MG tablet TAKE 0.5-2 TABLETS (1-4 MG TOTAL) BY MOUTH 2 (TWO) TIMES DAILY AS NEEDED FOR MUSCLE SPASMS.   venlafaxine  XR (EFFEXOR -XR) 150 MG 24 hr capsule Take 1 capsule (150 mg total) by mouth daily with breakfast. Take with 75 mg Effexor  XR for a combined total dose of 225 mg.   venlafaxine  XR (EFFEXOR -XR) 75 MG 24 hr capsule Take 1 capsule (75 mg total) by mouth daily with breakfast. Take with 150 mg Effexor  XR for a combined total dose of 225 mg.   No facility-administered encounter medications on file as of 06/25/2023.    Allergies  (verified) Patient has no known allergies.   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   Chicken pox as a child   Depression    been on antidepressants on and off for 20 yr   GERD (gastroesophageal reflux disease)    silent with hiatal hernia   Hiatal hernia 05/22/2013   Hiatal hernia with gastroesophageal reflux 05/22/2013   Hot tub folliculitis 12/18/2013   Hyperglycemia 05/19/2016   Hyperlipidemia 05/19/2016   Measles as a child   Postmenopausal atrophic vaginitis 05/22/2013   Preventative health care 01/19/2015   Shingles 68 yrs old   Unspecified vitamin D  deficiency 05/22/2013   Past Surgical History:  Procedure Laterality Date   ABDOMINAL HYSTERECTOMY  1998   partial-still has ovaries   BILIARY STENT PLACEMENT N/A 11/23/2019   Procedure: BILIARY STENT PLACEMENT;  Surgeon: Alvis Jourdain, MD;  Location: [facility];  Service: Endoscopy;  Laterality: N/A;   CHOLECYSTECTOMY N/A 01/27/2023   Procedure: OPEN CHOLECYSTECTOMY, DRAINAGE OF INTRA-ABDOMINAL ABSCESS;  Surgeon: Enid Harry, MD;  Location: [facility];  Service: General;  Laterality: N/A;   ENDOSCOPIC RETROGRADE CHOLANGIOPANCREATOGRAPHY (ERCP) WITH PROPOFOL  N/A 06/01/2020   Procedure: ENDOSCOPIC RETROGRADE CHOLANGIOPANCREATOGRAPHY (ERCP) WITH PROPOFOL ;  Surgeon: Alvis Jourdain, MD;  Location: [facility];  Service: Endoscopy;  Laterality: N/A;   ERCP N/A 11/23/2019   Procedure: ENDOSCOPIC RETROGRADE CHOLANGIOPANCREATOGRAPHY (ERCP);  Surgeon: Alvis Jourdain, MD;  Location: [facility];  Service: Endoscopy;  Laterality: N/A;   ESOPHAGECTOMY  05-2008   INDUCED ABORTION  1979   REMOVAL OF STONES  11/23/2019   Procedure: REMOVAL OF STONES;  Surgeon: Alvis Jourdain, MD;  Location: [facility];  Service: Endoscopy;;   SPHINCTEROTOMY  11/23/2019   Procedure: [address];  Surgeon: Alvis Jourdain, MD;  Location: [facility];  Service: Endoscopy;;   STENT REMOVAL  06/01/2020   Procedure:  STENT REMOVAL;  Surgeon: Alvis Jourdain, MD;  Location: [facility];  Service: Endoscopy;;   Family History  Problem Relation Age of Onset   Cancer Mother 72       breast cancer   Emphysema Mother        smoker   Heart attack Father        X several   Mental illness Brother        bipolar, xanax and thc abuse   Obesity Brother    Depression Daughter    Anxiety disorder Daughter    Other Maternal Grandmother        blood clot, dvt, lung   Cancer Paternal Grandmother        stomach   Heart disease Paternal Grandfather    Social History   Socioeconomic History   Marital status: Married    Spouse name: Josiah Nigh   Number of children: Not on file   Years of education: Not on file   Highest education level: Bachelor's  degree (e.g., BA, AB, BS)  Occupational History    Employer: [employer]    Comment: Event Planner  Tobacco Use   Smoking status: Former    Current packs/day: 0.10    Average packs/day: 0.1 packs/day for 25.5 years (2.6 ttl pk-yrs)    Types: Cigarettes    Start date: 01/13/1998   Smokeless tobacco: Never   Tobacco comments:    1-2 cigarettes a day  Vaping Use   Vaping status: Never Used  Substance and Sexual Activity   Alcohol use: Yes    Comment: a couple glasses of wine every other day   Drug use: No   Sexual activity: Yes    Partners: Male    Comment: lives with husband, event planner, no dietary restrictions  Other Topics Concern   Not on file  Social History Narrative   Married, husband Pharmacist, hospital   Social Drivers of Health   Financial Resource Strain: Low Risk  (06/25/2023)   Overall Financial Resource Strain (CARDIA)    Difficulty of Paying Living Expenses: Not hard at all  Food Insecurity: No Food Insecurity (06/25/2023)   Hunger Vital Sign    Worried About Running Out of Food in the Last Year: Never true    Ran Out of Food in the Last Year: Never true  Transportation Needs: No Transportation Needs (06/25/2023)   PRAPARE -  Administrator, Civil Service (Medical): No    Lack of Transportation (Non-Medical): No  Physical Activity: Insufficiently Active (06/25/2023)   Exercise Vital Sign    Days of Exercise per Week: 3 days    Minutes of Exercise per Session: 20 min  Stress: No Stress Concern Present (06/25/2023)   [employer] of Occupational Health - Occupational Stress Questionnaire    Feeling of Stress: Only a little  Social Connections: Socially Integrated (06/25/2023)   Social Connection and Isolation Panel    Frequency of Communication with Friends and Family: More than three times a week    Frequency of Social Gatherings with Friends and Family: More than three times a week    Attends Religious Services: More than 4 times per year    Active Member of [employer] or Organizations: Yes    Attends Engineer, structural: More than 4 times per year    Marital Status: Married    Tobacco Counseling Counseling given: Not Answered Tobacco comments: 1-2 cigarettes a day    Clinical Intake:  Pre-visit preparation completed: Yes  Pain : No/denies pain     BMI - recorded: 20.6 Nutritional Status: BMI of 19-24  Normal Nutritional Risks: None Diabetes: No  Lab Results  Component Value Date   HGBA1C 6.2 12/23/2022   HGBA1C 6.1 06/26/2022   HGBA1C 6.3 07/25/2021     How often do you need to have someone help you when you read instructions, pamphlets, or other written materials from your doctor or pharmacy?: 1 - Never  Interpreter Needed?: No  Information entered by :: Farris Hong LPN   Activities of Daily Living     06/25/2023    9:43 AM 06/25/2023    9:17 AM  In your present state of health, do you have any difficulty performing the following activities:  Hearing? 0 0  Vision? 0 0  Difficulty concentrating or making decisions? 0 0  Walking or climbing stairs? 0 0  Dressing or bathing? 0 0  Doing errands, shopping? 0 0  Preparing Food and eating ? N N  Using the  Toilet? N N  In the past six months, have you accidently leaked urine? N N  Do you have problems with loss of bowel control? N N  Managing your Medications? N N  Managing your Finances? N N  Housekeeping or managing your Housekeeping? N N    Patient Care Team: Neda Balk, MD as PCP - General (Family Medicine)  I have updated your Care Teams any recent Medical Services you may have received from other providers in the past year.     Assessment:   This is a routine wellness examination for [place].  Hearing/Vision screen Hearing Screening - Comments:: Denies hearing difficulties   Vision Screening - Comments:: Wears rx glasses - up to date with routine eye exams with  [facility]   Goals Addressed               This Visit's Progress     Increase physical activity (pt-stated)        Remain active       Depression Screen     06/25/2023    9:40 AM 08/18/2022    2:34 PM 06/12/2022    3:11 PM 07/25/2021   11:23 AM 07/25/2021   11:17 AM 06/05/2021    1:43 PM 10/15/2020   10:34 AM  PHQ 2/9 Scores  PHQ - 2 Score 0 0 0 0 0 0 0  PHQ- 9 Score    2   2    Fall Risk     06/25/2023    9:43 AM 06/25/2023    9:17 AM 08/18/2022    2:34 PM 06/26/2022    3:47 PM 06/12/2022    3:09 PM  Fall Risk   Falls in the past year? 1 1 1  0 0  Number falls in past yr: 0 1 0 0 0  Injury with Fall? 1 1 0 0 0  Comment Rt Hip injury followed by medical attention      Risk for fall due to :    No Fall Risks No Fall Risks  Follow up Falls evaluation completed  Falls evaluation completed Falls evaluation completed Falls evaluation completed    MEDICARE RISK AT HOME:  Medicare Risk at Home Any stairs in or around the home?: Yes If so, are there any without handrails?: No Home free of loose throw rugs in walkways, pet beds, electrical cords, etc?: Yes Adequate lighting in your home to reduce risk of falls?: Yes Life alert?: No Use of a cane, walker or w/c?: No Grab bars in the bathroom?:  No Shower chair or bench in shower?: Yes Elevated toilet seat or a handicapped toilet?: No  TIMED UP AND GO:  Was the test performed?  No  Cognitive Function: 6CIT completed        06/25/2023    9:45 AM 06/12/2022    3:19 PM 06/05/2021    1:45 PM 05/02/2020    9:10 AM  6CIT Screen  What Year? 0 points 0 points 0 points 0 points  What month? 0 points 0 points 0 points 0 points  What time? 0 points 0 points 0 points 0 points  Count back from 20 0 points 0 points 0 points 0 points  Months in reverse 0 points 0 points 0 points 0 points  Repeat phrase 0 points 0 points 0 points 0 points  Total Score 0 points 0 points 0 points 0 points    Immunizations Immunization History  Administered Date(s) Administered  Fluad Quad(high Dose 65+) 10/15/2020   Influenza, High Dose Seasonal PF 09/27/2018, 09/26/2019, 10/08/2021, 11/17/2022   Influenza,inj,Quad PF,6+ Mos 11/08/2014, 10/20/2017   Influenza-Unspecified 11/17/2013   PFIZER Comirnaty(Gray Top)Covid-19 Tri-Sucrose Vaccine 05/01/2020   PFIZER(Purple Top)SARS-COV-2 Vaccination 02/18/2019, 03/15/2019, 09/14/2019, 05/01/2020   Pfizer Covid-19 Vaccine Bivalent Booster 41yrs & up 10/10/2020   Pneumococcal Polysaccharide-23 11/22/2019   Td 01/14/2003   Td (Adult), 2 Lf Tetanus Toxid, Preservative Free 01/14/2003   Tdap 01/13/2010, 06/28/2010, 11/08/2014   Zoster Recombinant(Shingrix ) 10/11/2018, 08/05/2019   Zoster, Live 05/02/2013    Screening Tests Health Maintenance  Topic Date Due   Pneumococcal Vaccine: 50+ Years (2 of 2 - PCV) 11/21/2020   COVID-19 Vaccine (7 - 2024-25 season) 01/13/2024 (Originally 09/14/2022)   INFLUENZA VACCINE  08/14/2023   Medicare Annual Wellness (AWV)  06/24/2024   DTaP/Tdap/Td (5 - Td or Tdap) 11/07/2024   Colonoscopy  02/21/2025   MAMMOGRAM  03/01/2025   DEXA SCAN  Completed   Hepatitis C Screening  Completed   Zoster Vaccines- Shingrix   Completed   HPV VACCINES  Aged Out   Meningococcal B Vaccine   Aged Out    Health Maintenance  Health Maintenance Due  Topic Date Due   Pneumococcal Vaccine: 50+ Years (2 of 2 - PCV) 11/21/2020   Health Maintenance Items Addressed:    Additional Screening:  Vision Screening: Recommended annual ophthalmology exams for early detection of glaucoma and other disorders of the eye. Would you like a referral to an eye doctor? No    Dental Screening: Recommended annual dental exams for proper oral hygiene  Community Resource Referral / Chronic Care Management: CRR required this visit?  No   CCM required this visit?  No   Plan:    I have personally reviewed and noted the following in the patient's chart:   Medical and social history Use of alcohol, tobacco or illicit drugs  Current medications and supplements including opioid prescriptions. Patient is not currently taking opioid prescriptions. Functional ability and status Nutritional status Physical activity Advanced directives List of other physicians Hospitalizations, surgeries, and ER visits in previous 12 months Vitals Screenings to include cognitive, depression, and falls Referrals and appointments  In addition, I have reviewed and discussed with patient certain preventive protocols, quality metrics, and best practice recommendations. A written personalized care plan for preventive services as well as general preventive health recommendations were provided to patient.   Dewayne Ford, LPN   04/21/8117   After Visit Summary: (MyChart) Due to this being a telephonic visit, the after visit summary with patients personalized plan was offered to patient via MyChart   Notes: Nothing significant to report at this time.

## 2023-06-28 NOTE — Therapy (Signed)
 OUTPATIENT PHYSICAL THERAPY LOWER EXTREMITY TREATMENT NOTE   Patient Name: Lauren [name] MRN: [id number] DOB:02-Jan-1956, 68 y.o., female Today's Date: 06/29/2023  END OF SESSION:  PT End of Session - 06/29/23 1149     Visit Number 5    Date for PT Re-Evaluation 07/31/23    Authorization Type [employer] - medical necessity    Progress Note Due on Visit 10    PT Start Time 1149    PT Stop Time 1231    PT Time Calculation (min) 42 min    Activity Tolerance Patient tolerated treatment well    Behavior During Therapy [facility] for tasks assessed/performed             Past Medical History:  Diagnosis Date   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   Chicken pox as a child   Depression    been on antidepressants on and off for 20 yr   GERD (gastroesophageal reflux disease)    silent with hiatal hernia   Hiatal hernia 05/22/2013   Hiatal hernia with gastroesophageal reflux 05/22/2013   Hot tub folliculitis 12/18/2013   Hyperglycemia 05/19/2016   Hyperlipidemia 05/19/2016   Measles as a child   Postmenopausal atrophic vaginitis 05/22/2013   Preventative health care 01/19/2015   Shingles 68 yrs old   Unspecified vitamin D  deficiency 05/22/2013   Past Surgical History:  Procedure Laterality Date   ABDOMINAL HYSTERECTOMY  1998   partial-still has ovaries   BILIARY STENT PLACEMENT N/A 11/23/2019   Procedure: BILIARY STENT PLACEMENT;  Surgeon: Alvis Jourdain, MD;  Location: [facility];  Service: Endoscopy;  Laterality: N/A;   CHOLECYSTECTOMY N/A 01/27/2023   Procedure: OPEN CHOLECYSTECTOMY, DRAINAGE OF INTRA-ABDOMINAL ABSCESS;  Surgeon: Enid Harry, MD;  Location: [facility];  Service: General;  Laterality: N/A;   ENDOSCOPIC RETROGRADE CHOLANGIOPANCREATOGRAPHY (ERCP) WITH PROPOFOL  N/A 06/01/2020   Procedure: ENDOSCOPIC RETROGRADE CHOLANGIOPANCREATOGRAPHY (ERCP) WITH PROPOFOL ;  Surgeon: Alvis Jourdain, MD;  Location: [facility];  Service: Endoscopy;  Laterality: N/A;   ERCP N/A  11/23/2019   Procedure: ENDOSCOPIC RETROGRADE CHOLANGIOPANCREATOGRAPHY (ERCP);  Surgeon: Alvis Jourdain, MD;  Location: [facility];  Service: Endoscopy;  Laterality: N/A;   ESOPHAGECTOMY  05-2008   INDUCED ABORTION  1979   REMOVAL OF STONES  11/23/2019   Procedure: REMOVAL OF STONES;  Surgeon: Alvis Jourdain, MD;  Location: [facility];  Service: Endoscopy;;   SPHINCTEROTOMY  11/23/2019   Procedure: [address];  Surgeon: Alvis Jourdain, MD;  Location: [facility];  Service: Endoscopy;;   STENT REMOVAL  06/01/2020   Procedure: STENT REMOVAL;  Surgeon: Alvis Jourdain, MD;  Location: [facility];  Service: Endoscopy;;   Patient Active Problem List   Diagnosis Date Noted   Primary osteoarthritis of right hip 06/04/2023   Protein-calorie malnutrition, severe 01/29/2023   Perforated bowel (HCC) 01/26/2023   Perforated gallbladder 01/26/2023   Dysphagia 10/17/2022   Esophageal stricture 10/17/2022   History of malignant neoplasm of esophagus 10/17/2022   Leg pain 06/25/2022   Elevated liver enzymes 07/25/2021   Memory changes 10/15/2020   Ulnar neuropathy 12/27/2019   Abnormal LFTs    Choledocholithiasis with obstruction 11/22/2019   Mass of skin of left thumb 08/31/2019   Mucoid cyst, joint 08/31/2019   Osteoarthritis of finger of left hand 08/31/2019   Hyperglycemia 05/19/2016   Hyperlipidemia 05/19/2016   Hiatal hernia with gastroesophageal reflux 05/22/2013   Postmenopausal atrophic vaginitis 05/22/2013   Vitamin D  deficiency 05/22/2013   Chicken pox    History of shingles  Measles    Cancer (HCC)    Depression     PCP: Daril Edge, MD  REFERRING PROVIDER: Syliva Even, MD  REFERRING DIAG: [phone] (ICD-10-CM) - Right leg pain M25.551 (ICD-10-CM) - Right hip pain  Rationale for Evaluation and Treatment: Rehabilitation  THERAPY DIAG:  Pain in right hip  Muscle weakness (generalized)  ONSET DATE: approx 3 mos ago - has had some falls  SUBJECTIVE:                                                                                                                                                                                            SUBJECTIVE STATEMENT: I was Rolfed and it felt great. Between the DN, PT and Rolfing, I have no pain. I'm not going to get a cortisone shot. The ball release helps. Middle of the night, getting in/out of low car and the morning are the worst times.  EVAL: Pt referred to PT for Rt hip pain.  Has had multiple falls which have contributed.  One fall about 9 mos ago and another 3 mos ago.  Hip gave out going up a big step and caused the fall.  Xrays done yesterday show severe OA of Rt hip.  In past has had massage, DN and stretching at [facility] which helped. She has no LBP. 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  Right now planning the [employer].  I also go to [employer] to lift weights but have taken a break from that b/c the machines were hurting my hip.    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 She had an esophagectomy in 2010. She has been in remission now for 15 years  PAIN:  PAIN:  Are you having pain? Yes NPRS scale: 0/10 Pain location: Rt buttock and groin Pain orientation: Right  PAIN TYPE: aching, dull, and sharp Pain description: intermittent  Aggravating factors: hip abduction, getting out of the car, rolling in bed at night, climbing deep stairs Relieving factors: ice, stretching, hot tub, DN   PRECAUTIONS: Fall  RED FLAGS: None   WEIGHT BEARING RESTRICTIONS: No  FALLS:  Has patient fallen in last 6 months? Yes. Number of falls 1  LIVING ENVIRONMENT: Lives with: lives with their spouse Lives in: House/apartment Stairs: Yes: Internal: 13 steps; on right going up and External: 4 steps; on left going up Has following equipment at home: None  OCCUPATION: self-employed - throws outdoor festivals, part-time   PLOF: Independent  PATIENT  GOALS: be lighter on my feet, less pain, get stronger  NEXT MD VISIT: approx 1  mo to see if needs an intra-articular injection  OBJECTIVE:  Note: Objective measures were completed at Evaluation unless otherwise noted.  DIAGNOSTIC FINDINGS:  X-ray of Rt hip on 06/05/23 images right hip shows severe osteoarthritis  PATIENT SURVEYS:  LEFS: 39/80 - hardest activities are heavy activiites at home, getting in/out of car, sitting x1 hour, rolling over in bed (all rated 2)  COGNITION: Overall cognitive status: Within functional limits for tasks assessed     SENSATION: WFL  MUSCLE LENGTH:   POSTURE: scoliosis  PALPATION: Adductors tight and tender Piriformis and gluteals tight and tender  LUMBAR ROM:  All WNL and painfree  LOWER EXTREMITY ROM:     Left hip full ROM and painfree  Passive  Right eval Left eval  Hip flexion 90 deg, pain   Hip extension    Hip abduction 40 deg, pain   Hip adduction    Hip internal rotation To neutral from ER, pain   Hip external rotation Tight end range   Knee flexion WNL   Knee extension WNL   Ankle dorsiflexion    Ankle plantarflexion    Ankle inversion    Ankle eversion     (Blank rows = not tested)  LOWER EXTREMITY MMT:    MMT Right eval Left eval  Hip flexion 4- 4+  Hip extension 4 4+  Hip abduction 3+ 4  Hip adduction 4- 5  Hip internal rotation 3+ 4+  Hip external rotation 3+ 4+  Knee flexion 4 4+  Knee extension 4+ 4+  Ankle dorsiflexion    Ankle plantarflexion    Ankle inversion    Ankle eversion     (Blank rows = not tested)    FUNCTIONAL TESTS:  5 times sit to stand: 14.49 no UE assist, anterior hip and thigh pain Timed up and go (TUG): 7.42  GAIT: Distance walked:  Assistive device utilized: None Level of assistance: Complete Independence Comments: Rt hip is ER, Pt using hip adductors as flexors, shorter stride length on Rt  TREATMENT DATE:  06/29/23: NuStep L3 x 5 min (L5 caused some pain with  pushing)  PT present to discuss status Squat to chair x 10 Sit to stand x 5 - feels all the way down leg Quadruped hip ADDuctor stretch x 10 B sitting bottom back R foot on 2nd step hip ER and flexing down toward floor 2 x 30 sec Worked on self hip distraction using purple power cord and patient scooting bwd - feels better with hip in partial flexion and ABD versus having cord at hip height.  Manual: TPR to R psoas;  Inferior hip mobs with R knee in flexion Supine hip long axis distraction right grade IV/V x 4 minutes Prone PA R hip mobs; also stabilized ant glide with contract/relax of piriformis; PROM in to R hip IR/ER - less pain with IR when ant glide stabilized UPA mobs B lumbar for psoas release Supine  hip flexor stretch 2 x 30 sec   06/22/23: NuStep 2' L5 PT present to discuss status Quadruped hip ADDuctor stretch x 10 B sitting bottom back Attempted eccentric hip flexor strengthening in modified Thomas position, but painful. Better with TA contraction, but still difficult. Manual: TPR to R psoas;  UPA mobs B lumbar for psoas release TPR to R QL in S/L Supine hip long axis distraction right grade  Supine lateral hip distraction with gait belt   Seated hip flexor stretch 2 x 30 sec  Lt SL: Rt clam A/ROM  2x10, Rt hip abd 2x10 A/ROM with exhale on lift and TA indraw self release with ball to R psoas in prone Prone press ups x 10  06/19/23: NuStep 2' L5 PT present to discuss plan for session Supine Rt LE manual traction by PT Gr III/IV in varied angles  Supine bridge with TA indraw 10x5 Lt SL: Rt clam A/ROM x10, Rt hip abd x10 A/ROM with exhale on lift and TA indraw Updated HEP to include SL hip abd Trigger Point Dry Needling  Initial Treatment: Pt instructed on Dry Needling rational, procedures, and possible side effects. Pt instructed to expect mild to moderate muscle soreness later in the day and/or into the next day.  Pt instructed in methods to reduce muscle soreness. Pt  instructed to continue prescribed HEP. Patient was educated on signs and symptoms of infection and other risk factors and advised to seek medical attention should they occur.  Patient verbalized understanding of these instructions and education.   Patient Verbal Consent Given: Yes Education Handout Provided: Previously Provided Muscles Treated: glut min, glut med, piriformis, adductors - all on Rt Electrical Stimulation Performed: No Treatment Response/Outcome: signif twitch and release of trigger points in all muscle groups Pt with greater ease seated crossing leg to don shoe after DN    PATIENT EDUCATION:  Education details: [id number] Person educated: Patient Education method: Explanation, Demonstration, Verbal cues, and Handouts Education comprehension: verbalized understanding and returned demonstration  HOME EXERCISE PROGRAM: Access Code: [email or web address] URL: [email or web address] Date: 06/22/2023 Prepared by: Concha Deed  Exercises - Hip Flexor Stretch at Edge of Bed  - 2-3 x daily - 7 x weekly - 1 sets - 2 reps - 30 hold - Supine Piriformis Stretch with Leg Straight  - 2-3 x daily - 7 x weekly - 1 sets - 2 reps - 30 hold - Figure 4 Bridge  - 1 x daily - 7 x weekly - 2 sets - 10 reps - Squat with Chair Touch  - 1 x daily - 7 x weekly - 1 sets - 10 reps - Runner's Step Up/Down  - 1 x daily - 7 x weekly - 2 sets - 10 reps - Hip Hiking on Step  - 1 x daily - 7 x weekly - 2 sets - 10 reps - Forward T with Counter Support  - 1 x daily - 7 x weekly - 2 sets - 10 reps - Sidelying Hip Abduction  - 1 x daily - 7 x weekly - 2 sets - 10 reps - Seated Hip Flexor Stretch  - 2 x daily - 7 x weekly - 1 sets - 3 reps - 30-60 sec hold - Psoas Mobilization with Small Ball (Mirrored)  - 1 x daily - 7 x weekly - 1 sets - 1 reps - 30-60 sec hold - Prone Press Up  - 3 x daily - 7 x weekly - 1-3 sets - 10 reps - Kneeling Adductor Stretch with Hip External Rotation  - 1 x daily - 3-4 x weekly - 1  sets - 3 reps - 30-60 sec hold  ASSESSMENT:  CLINICAL IMPRESSION: Patient reports no pain today upon arrival. She feels that PT, DN and Rolfing are all helping her pain. She continues to experience pain with pressing through the hip, ADDucting the hip (as when getting in a car) and with hip flexion, especially when in a supine position. Pain is worse in the middle of the night and in the morning and feels better once she is  up and moving. She responds very well to LLD with decreased pain and improved mobility following. She also has increased soreness in the R QL after prolonged LLD inidcating the QL and lumbar paraspinals on the right are tight. She also had decreased pain with ant glide of the greater trochanter combined with IR in prone. Her event is this weekend, so she cancelled Friday's appointment. She would benefit from ongoing hip mobilizations and the addition of hip flexor strengthening as tolerated at her next visit.      Eval:Patient is a 68 y.o. female who was seen today for physical therapy evaluation and treatment for Rt hip pain.  She is very fit and active but has had several falls related to stepping up onto high step/ledge where the Rt hip gave out.  Hip pain started with 2nd fall 3 mos ago and she just learned yesterday with an X-ray that she has severe bone-on-bone Rt hip OA.  Pt presents with Rt hip resting in ER with signif pain and ROM restriction moving towards IR to get hip to neutral.  She has groin pain and buttock pain and it radiates into anterior thigh.  Her LE positioning is causing her adductors to act more as flexors which are being overused and have signif tightness and tenderness.  DN and stretching in recent past has helped.  Pt presents with abnormal alignment of Rt hip with signif ROM restrictions with pain related to OA.  She has painful weakness surrounding the hip.  Pain is worse with marching, hip abd movements, climbing steeper stairs, squatting, and rolling  over in bed at night.  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  Pt will benefit from skilled PT to address findings and maximize participation in daily activities.  OBJECTIVE IMPAIRMENTS: Abnormal gait, decreased activity tolerance, decreased balance, decreased knowledge of condition, decreased mobility, decreased ROM, decreased strength, hypomobility, increased muscle spasms, impaired flexibility, postural dysfunction, and pain.   ACTIVITY LIMITATIONS: carrying, lifting, bending, sitting, squatting, sleeping, stairs, transfers, and locomotion level  PARTICIPATION LIMITATIONS: cleaning, driving, community activity, occupation, and yard work  PERSONAL FACTORS: 1 comorbidity: falls related to weakness of Rt hip are also affecting patient's functional outcome.   REHAB POTENTIAL: Excellent  CLINICAL DECISION MAKING: Stable/uncomplicated  EVALUATION COMPLEXITY: Low   GOALS: Goals reviewed with patient? Yes  SHORT TERM GOALS: Target date: 07/03/23  Pt will be ind with initial HEP without exacerbation of pain Baseline: Goal status: IN PROGRESS 6/16 (fig 4  and ADDuction hurt)  2.  Pt will be able to squat and march to climb stairs in sagittal plane with minimal pain Baseline:  Goal status: PARTIALLY MET for stairs 06/29/23  3.  Pt will express understanding of movement as medicine and take frequent breaks from static positions to avoid stiffness related pain Baseline:  Goal status: INITIAL    LONG TERM GOALS: Target date: 07/31/23  Pt will be ind with advanced HEP and understand importance of compliance upon d/c from PT Baseline:  Goal status: INITIAL  2.  Pt will improve LEFS by at least 9 points to demo improved function Baseline: 39/80, goal will be met at 48/80 Goal status: INITIAL  3.  Pt will report pain not to exceed 4/10 with getting in and out of car Baseline: 7/10 Goal status: INITIAL  4.  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  Pt will improve Rt hip strength to allow her to step up onto 8 inch step while holding 10lb box to simulate work tasks Baseline:  Goal status: INITIAL  6.  Pt will report pain with daily activities to improve by at least 50% Baseline:  Goal status: INITIAL  PLAN:  PT FREQUENCY: 2x/week  PT DURATION: 8 weeks  PLANNED INTERVENTIONS: 97110-Therapeutic exercises, 97530- Therapeutic activity, 97112- Neuromuscular re-education, 97535- Self Care, 65784- Manual therapy, [phone]- Gait training, [phone]- Aquatic Therapy, [phone]- Electrical stimulation (unattended), [phone]- Ionotophoresis 4mg /ml Dexamethasone , Patient/Family education, Balance training, Stair training, Taping, Dry Needling, Joint mobilization, Spinal mobilization, Cryotherapy, and Moist heat.  PLAN FOR NEXT SESSION: How did she mange during her event. Continue with LLD and ant hip mobs, Rt psoas strength and ROM, flexibility of adductors, piriformis, gluteals, Rt hip and core strength for functional task performance as tol, Pt needs to be strong enough to step up onto taller ledges for work while carrying [employer], PT  06/29/23 12:50 PM

## 2023-06-29 ENCOUNTER — Ambulatory Visit: Admitting: Physical Therapy

## 2023-06-29 DIAGNOSIS — M25551 Pain in right hip: Secondary | ICD-10-CM

## 2023-06-29 DIAGNOSIS — M6281 Muscle weakness (generalized): Secondary | ICD-10-CM | POA: Diagnosis not present

## 2023-07-01 ENCOUNTER — Ambulatory Visit: Admitting: Family Medicine

## 2023-07-03 ENCOUNTER — Encounter: Admitting: Physical Therapy

## 2023-07-06 ENCOUNTER — Ambulatory Visit: Admitting: Physical Therapy

## 2023-07-06 NOTE — Therapy (Incomplete)
 OUTPATIENT PHYSICAL THERAPY LOWER EXTREMITY TREATMENT NOTE   Patient Name: Lauren [name] MRN: [id number] DOB:09/24/1955, 68 y.o., female Today's Date: 07/06/2023  END OF SESSION:       Past Medical History:  Diagnosis Date   Cancer [facility]) 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   Chicken pox as a child   Depression    been on antidepressants on and off for 20 yr   GERD (gastroesophageal reflux disease)    silent with hiatal hernia   Hiatal hernia 05/22/2013   Hiatal hernia with gastroesophageal reflux 05/22/2013   Hot tub folliculitis 12/18/2013   Hyperglycemia 05/19/2016   Hyperlipidemia 05/19/2016   Measles as a child   Postmenopausal atrophic vaginitis 05/22/2013   Preventative health care 01/19/2015   Shingles 68 yrs old   Unspecified vitamin D  deficiency 05/22/2013   Past Surgical History:  Procedure Laterality Date   ABDOMINAL HYSTERECTOMY  1998   partial-still has ovaries   BILIARY STENT PLACEMENT N/A 11/23/2019   Procedure: BILIARY STENT PLACEMENT;  Surgeon: Rollin Dover, MD;  Location: [facility];  Service: Endoscopy;  Laterality: N/A;   CHOLECYSTECTOMY N/A 01/27/2023   Procedure: OPEN CHOLECYSTECTOMY, DRAINAGE OF INTRA-ABDOMINAL ABSCESS;  Surgeon: Ebbie Cough, MD;  Location: [facility];  Service: General;  Laterality: N/A;   ENDOSCOPIC RETROGRADE CHOLANGIOPANCREATOGRAPHY (ERCP) WITH PROPOFOL  N/A 06/01/2020   Procedure: ENDOSCOPIC RETROGRADE CHOLANGIOPANCREATOGRAPHY (ERCP) WITH PROPOFOL ;  Surgeon: Rollin Dover, MD;  Location: [facility];  Service: Endoscopy;  Laterality: N/A;   ERCP N/A 11/23/2019   Procedure: ENDOSCOPIC RETROGRADE CHOLANGIOPANCREATOGRAPHY (ERCP);  Surgeon: Rollin Dover, MD;  Location: [facility];  Service: Endoscopy;  Laterality: N/A;   ESOPHAGECTOMY  05-2008   INDUCED ABORTION  1979   REMOVAL OF STONES  11/23/2019   Procedure: REMOVAL OF STONES;  Surgeon: Rollin Dover, MD;  Location: [facility];  Service: Endoscopy;;   SPHINCTEROTOMY  11/23/2019    Procedure: ANNETT;  Surgeon: Rollin Dover, MD;  Location: [facility];  Service: Endoscopy;;   STENT REMOVAL  06/01/2020   Procedure: STENT REMOVAL;  Surgeon: Rollin Dover, MD;  Location: [facility];  Service: Endoscopy;;   Patient Active Problem List   Diagnosis Date Noted   Primary osteoarthritis of right hip 06/04/2023   Protein-calorie malnutrition, severe 01/29/2023   Perforated bowel (HCC) 01/26/2023   Perforated gallbladder 01/26/2023   Dysphagia 10/17/2022   Esophageal stricture 10/17/2022   History of malignant neoplasm of esophagus 10/17/2022   Leg pain 06/25/2022   Elevated liver enzymes 07/25/2021   Memory changes 10/15/2020   Ulnar neuropathy 12/27/2019   Abnormal LFTs    Choledocholithiasis with obstruction 11/22/2019   Mass of skin of left thumb 08/31/2019   Mucoid cyst, joint 08/31/2019   Osteoarthritis of finger of left hand 08/31/2019   Hyperglycemia 05/19/2016   Hyperlipidemia 05/19/2016   Hiatal hernia with gastroesophageal reflux 05/22/2013   Postmenopausal atrophic vaginitis 05/22/2013   Vitamin D  deficiency 05/22/2013   Chicken pox    History of shingles    Measles    Cancer (HCC)    Depression     PCP: Glade Horton, MD  REFERRING PROVIDER: Joane Artist RAMAN, MD  REFERRING DIAG: [phone] (ICD-10-CM) - Right leg pain M25.551 (ICD-10-CM) - Right hip pain  Rationale for Evaluation and Treatment: Rehabilitation  THERAPY DIAG:  No diagnosis found.  ONSET DATE: approx 3 mos ago - has had some falls  SUBJECTIVE:  SUBJECTIVE STATEMENT: I was Rolfed and it felt great. Between the DN, PT and Rolfing, I have no pain. I'm not going to get a cortisone shot. The ball release helps. Middle of the night, getting in/out of low car and the morning are the worst  times.  EVAL: Pt referred to PT for Rt hip pain.  Has had multiple falls which have contributed.  One fall about 9 mos ago and another 3 mos ago.  Hip gave out going up a big step and caused the fall.  Xrays done yesterday show severe OA of Rt hip.  In past has had massage, DN and stretching at [facility] which helped. She has no LBP. 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  Right now planning the [employer].  I also go to [employer] to lift weights but have taken a break from that b/c the machines were hurting my hip.    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 She had an esophagectomy in 2010. She has been in remission now for 15 years  PAIN:  PAIN:  Are you having pain? Yes NPRS scale: 0/10 Pain location: Rt buttock and groin Pain orientation: Right  PAIN TYPE: aching, dull, and sharp Pain description: intermittent  Aggravating factors: hip abduction, getting out of the car, rolling in bed at night, climbing deep stairs Relieving factors: ice, stretching, hot tub, DN   PRECAUTIONS: Fall  RED FLAGS: None   WEIGHT BEARING RESTRICTIONS: No  FALLS:  Has patient fallen in last 6 months? Yes. Number of falls 1  LIVING ENVIRONMENT: Lives with: lives with their spouse Lives in: House/apartment Stairs: Yes: Internal: 13 steps; on right going up and External: 4 steps; on left going up Has following equipment at home: None  OCCUPATION: self-employed - throws outdoor festivals, part-time   PLOF: Independent  PATIENT GOALS: be lighter on my feet, less pain, get stronger  NEXT MD VISIT: approx 1 mo to see if needs an intra-articular injection  OBJECTIVE:  Note: Objective measures were completed at Evaluation unless otherwise noted.  DIAGNOSTIC FINDINGS:  X-ray of Rt hip on 06/05/23 images right hip shows severe osteoarthritis  PATIENT SURVEYS:  LEFS: 39/80 - hardest activities are heavy activiites at home, getting  in/out of car, sitting x1 hour, rolling over in bed (all rated 2)  COGNITION: Overall cognitive status: Within functional limits for tasks assessed     SENSATION: WFL  MUSCLE LENGTH:   POSTURE: scoliosis  PALPATION: Adductors tight and tender Piriformis and gluteals tight and tender  LUMBAR ROM:  All WNL and painfree  LOWER EXTREMITY ROM:     Left hip full ROM and painfree  Passive  Right eval Left eval  Hip flexion 90 deg, pain   Hip extension    Hip abduction 40 deg, pain   Hip adduction    Hip internal rotation To neutral from ER, pain   Hip external rotation Tight end range   Knee flexion WNL   Knee extension WNL   Ankle dorsiflexion    Ankle plantarflexion    Ankle inversion    Ankle eversion     (Blank rows = not tested)  LOWER EXTREMITY MMT:    MMT Right eval Left eval  Hip flexion 4- 4+  Hip extension 4 4+  Hip abduction 3+ 4  Hip adduction 4- 5  Hip internal rotation 3+ 4+  Hip external rotation 3+ 4+  Knee flexion 4 4+  Knee extension 4+ 4+  Ankle dorsiflexion    Ankle plantarflexion    Ankle inversion    Ankle eversion     (Blank rows = not tested)    FUNCTIONAL TESTS:  5 times sit to stand: 14.49 no UE assist, anterior hip and thigh pain Timed up and go (TUG): 7.42  GAIT: Distance walked:  Assistive device utilized: None Level of assistance: Complete Independence Comments: Rt hip is ER, Pt using hip adductors as flexors, shorter stride length on Rt  TREATMENT DATE:  07/06/23:   06/29/23: NuStep L3 x 5 min (L5 caused some pain with pushing)  PT present to discuss status Squat to chair x 10 Sit to stand x 5 - feels all the way down leg Quadruped hip ADDuctor stretch x 10 B sitting bottom back R foot on 2nd step hip ER and flexing down toward floor 2 x 30 sec Worked on self hip distraction using purple power cord and patient scooting bwd - feels better with hip in partial flexion and ABD versus having cord at hip height.   Manual: TPR to R psoas;  Inferior hip mobs with R knee in flexion Supine hip long axis distraction right grade IV/V x 4 minutes Prone PA R hip mobs; also stabilized ant glide with contract/relax of piriformis; PROM in to R hip IR/ER - less pain with IR when ant glide stabilized UPA mobs B lumbar for psoas release Supine  hip flexor stretch 2 x 30 sec   06/22/23: NuStep 2' L5 PT present to discuss status Quadruped hip ADDuctor stretch x 10 B sitting bottom back Attempted eccentric hip flexor strengthening in modified Thomas position, but painful. Better with TA contraction, but still difficult. Manual: TPR to R psoas;  UPA mobs B lumbar for psoas release TPR to R QL in S/L Supine hip long axis distraction right grade  Supine lateral hip distraction with gait belt   Seated hip flexor stretch 2 x 30 sec  Lt SL: Rt clam A/ROM 2x10, Rt hip abd 2x10 A/ROM with exhale on lift and TA indraw self release with ball to R psoas in prone Prone press ups x 10  ASSESSMENT:  CLINICAL IMPRESSION: Patient reports no pain today upon arrival. She feels that PT, DN and Rolfing are all helping her pain. She continues to experience pain with pressing through the hip, ADDucting the hip (as when getting in a car) and with hip flexion, especially when in a supine position. Pain is worse in the middle of the night and in the morning and feels better once she is up and moving. She responds very well to LLD with decreased pain and improved mobility following. She also has increased soreness in the R QL after prolonged LLD inidcating the QL and lumbar paraspinals on the right are tight. She also had decreased pain with ant glide of the greater trochanter combined with IR in prone. Her event is this weekend, so she cancelled Friday's appointment. She would benefit from ongoing hip mobilizations and the addition of hip flexor strengthening as tolerated at her next visit.      Eval:Patient is a 68 y.o. female who was  seen today for physical therapy evaluation and treatment for Rt hip pain.  She is very fit and active but has had several falls related to stepping up onto high step/ledge where the Rt hip gave out.  Hip pain started with 2nd fall 3 mos ago and she just learned yesterday with an X-ray that she  has severe bone-on-bone Rt hip OA.  Pt presents with Rt hip resting in ER with signif pain and ROM restriction moving towards IR to get hip to neutral.  She has groin pain and buttock pain and it radiates into anterior thigh.  Her LE positioning is causing her adductors to act more as flexors which are being overused and have signif tightness and tenderness.  DN and stretching in recent past has helped.  Pt presents with abnormal alignment of Rt hip with signif ROM restrictions with pain related to OA.  She has painful weakness surrounding the hip.  Pain is worse with marching, hip abd movements, climbing steeper stairs, squatting, and rolling over in bed at night.  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  Pt will benefit from skilled PT to address findings and maximize participation in daily activities.  OBJECTIVE IMPAIRMENTS: Abnormal gait, decreased activity tolerance, decreased balance, decreased knowledge of condition, decreased mobility, decreased ROM, decreased strength, hypomobility, increased muscle spasms, impaired flexibility, postural dysfunction, and pain.   ACTIVITY LIMITATIONS: carrying, lifting, bending, sitting, squatting, sleeping, stairs, transfers, and locomotion level  PARTICIPATION LIMITATIONS: cleaning, driving, community activity, occupation, and yard work  PERSONAL FACTORS: 1 comorbidity: falls related to weakness of Rt hip are also affecting patient's functional outcome.   REHAB POTENTIAL: Excellent  CLINICAL DECISION MAKING: Stable/uncomplicated  EVALUATION COMPLEXITY: Low   GOALS: Goals reviewed with patient?  Yes  SHORT TERM GOALS: Target date: 07/03/23  Pt will be ind with initial HEP without exacerbation of pain Baseline: Goal status: IN PROGRESS 6/16 (fig 4  and ADDuction hurt)  2.  Pt will be able to squat and march to climb stairs in sagittal plane with minimal pain Baseline:  Goal status: PARTIALLY MET for stairs 06/29/23  3.  Pt will express understanding of movement as medicine and take frequent breaks from static positions to avoid stiffness related pain Baseline:  Goal status: INITIAL    LONG TERM GOALS: Target date: 07/31/23  Pt will be ind with advanced HEP and understand importance of compliance upon d/c from PT Baseline:  Goal status: INITIAL  2.  Pt will improve LEFS by at least 9 points to demo improved function Baseline: 39/80, goal will be met at 48/80 Goal status: INITIAL  3.  Pt will report pain not to exceed 4/10 with getting in and out of car Baseline: 7/10 Goal status: INITIAL  4.  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  Pt will improve Rt hip strength to allow her to step up onto 8 inch step while holding 10lb box to simulate work tasks Baseline:  Goal status: INITIAL  6.  Pt will report pain with daily activities to improve by at least 50% Baseline:  Goal status: INITIAL  PLAN:  PT FREQUENCY: 2x/week  PT DURATION: 8 weeks  PLANNED INTERVENTIONS: 97110-Therapeutic exercises, 97530- Therapeutic activity, 97112- Neuromuscular re-education, 97535- Self Care, [id number]- Manual therapy, [phone]- Gait training, [phone]- Aquatic Therapy, [phone]- Electrical stimulation (unattended), [phone]- Ionotophoresis 4mg /ml Dexamethasone , Patient/Family education, Balance training, Stair training, Taping, Dry Needling, Joint mobilization, Spinal mobilization, Cryotherapy, and Moist heat.  PLAN FOR NEXT SESSION: How did she mange during her event. Continue with  LLD and ant hip mobs, Rt psoas strength and ROM, flexibility of adductors, piriformis, gluteals, Rt hip and core strength for functional task  performance as tol, Pt needs to be strong enough to step up onto taller ledges for work while carrying [employer], PT  07/06/23 7:46 AM

## 2023-07-07 DIAGNOSIS — F331 Major depressive disorder, recurrent, moderate: Secondary | ICD-10-CM | POA: Diagnosis not present

## 2023-07-07 NOTE — Therapy (Incomplete)
 OUTPATIENT PHYSICAL THERAPY LOWER EXTREMITY TREATMENT NOTE   Patient Name: Lauren [name] MRN: [id number] DOB:1955/12/08, 68 y.o., female Today's Date: 07/07/2023  END OF SESSION:       Past Medical History:  Diagnosis Date   Cancer [facility]) 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   Chicken pox as a child   Depression    been on antidepressants on and off for 20 yr   GERD (gastroesophageal reflux disease)    silent with hiatal hernia   Hiatal hernia 05/22/2013   Hiatal hernia with gastroesophageal reflux 05/22/2013   Hot tub folliculitis 12/18/2013   Hyperglycemia 05/19/2016   Hyperlipidemia 05/19/2016   Measles as a child   Postmenopausal atrophic vaginitis 05/22/2013   Preventative health care 01/19/2015   Shingles 68 yrs old   Unspecified vitamin D  deficiency 05/22/2013   Past Surgical History:  Procedure Laterality Date   ABDOMINAL HYSTERECTOMY  1998   partial-still has ovaries   BILIARY STENT PLACEMENT N/A 11/23/2019   Procedure: BILIARY STENT PLACEMENT;  Surgeon: Rollin Dover, MD;  Location: [facility];  Service: Endoscopy;  Laterality: N/A;   CHOLECYSTECTOMY N/A 01/27/2023   Procedure: OPEN CHOLECYSTECTOMY, DRAINAGE OF INTRA-ABDOMINAL ABSCESS;  Surgeon: Ebbie Cough, MD;  Location: [facility];  Service: General;  Laterality: N/A;   ENDOSCOPIC RETROGRADE CHOLANGIOPANCREATOGRAPHY (ERCP) WITH PROPOFOL  N/A 06/01/2020   Procedure: ENDOSCOPIC RETROGRADE CHOLANGIOPANCREATOGRAPHY (ERCP) WITH PROPOFOL ;  Surgeon: Rollin Dover, MD;  Location: [facility];  Service: Endoscopy;  Laterality: N/A;   ERCP N/A 11/23/2019   Procedure: ENDOSCOPIC RETROGRADE CHOLANGIOPANCREATOGRAPHY (ERCP);  Surgeon: Rollin Dover, MD;  Location: [facility];  Service: Endoscopy;  Laterality: N/A;   ESOPHAGECTOMY  05-2008   INDUCED ABORTION  1979   REMOVAL OF STONES  11/23/2019   Procedure: REMOVAL OF STONES;  Surgeon: Rollin Dover, MD;  Location: [facility];  Service: Endoscopy;;   SPHINCTEROTOMY  11/23/2019    Procedure: ANNETT;  Surgeon: Rollin Dover, MD;  Location: [facility];  Service: Endoscopy;;   STENT REMOVAL  06/01/2020   Procedure: STENT REMOVAL;  Surgeon: Rollin Dover, MD;  Location: [facility];  Service: Endoscopy;;   Patient Active Problem List   Diagnosis Date Noted   Primary osteoarthritis of right hip 06/04/2023   Protein-calorie malnutrition, severe 01/29/2023   Perforated bowel (HCC) 01/26/2023   Perforated gallbladder 01/26/2023   Dysphagia 10/17/2022   Esophageal stricture 10/17/2022   History of malignant neoplasm of esophagus 10/17/2022   Leg pain 06/25/2022   Elevated liver enzymes 07/25/2021   Memory changes 10/15/2020   Ulnar neuropathy 12/27/2019   Abnormal LFTs    Choledocholithiasis with obstruction 11/22/2019   Mass of skin of left thumb 08/31/2019   Mucoid cyst, joint 08/31/2019   Osteoarthritis of finger of left hand 08/31/2019   Hyperglycemia 05/19/2016   Hyperlipidemia 05/19/2016   Hiatal hernia with gastroesophageal reflux 05/22/2013   Postmenopausal atrophic vaginitis 05/22/2013   Vitamin D  deficiency 05/22/2013   Chicken pox    History of shingles    Measles    Cancer (HCC)    Depression     PCP: Glade Horton, MD  REFERRING PROVIDER: Joane Artist RAMAN, MD  REFERRING DIAG: [phone] (ICD-10-CM) - Right leg pain M25.551 (ICD-10-CM) - Right hip pain  Rationale for Evaluation and Treatment: Rehabilitation  THERAPY DIAG:  No diagnosis found.  ONSET DATE: approx 3 mos ago - has had some falls  SUBJECTIVE:  SUBJECTIVE STATEMENT: ***  EVAL: Pt referred to PT for Rt hip pain.  Has had multiple falls which have contributed.  One fall about 9 mos ago and another 3 mos ago.  Hip gave out going up a big step and caused the fall.  Xrays done yesterday show severe  OA of Rt hip.  In past has had massage, DN and stretching at [facility] which helped. She has no LBP. 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  Right now planning the [employer].  I also go to [employer] to lift weights but have taken a break from that b/c the machines were hurting my hip.    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 She had an esophagectomy in 2010. She has been in remission now for 15 years  PAIN:  PAIN:  Are you having pain? Yes NPRS scale: 0/10 Pain location: Rt buttock and groin Pain orientation: Right  PAIN TYPE: aching, dull, and sharp Pain description: intermittent  Aggravating factors: hip abduction, getting out of the car, rolling in bed at night, climbing deep stairs Relieving factors: ice, stretching, hot tub, DN   PRECAUTIONS: Fall  RED FLAGS: None   WEIGHT BEARING RESTRICTIONS: No  FALLS:  Has patient fallen in last 6 months? Yes. Number of falls 1  LIVING ENVIRONMENT: Lives with: lives with their spouse Lives in: House/apartment Stairs: Yes: Internal: 13 steps; on right going up and External: 4 steps; on left going up Has following equipment at home: None  OCCUPATION: self-employed - throws outdoor festivals, part-time   PLOF: Independent  PATIENT GOALS: be lighter on my feet, less pain, get stronger  NEXT MD VISIT: approx 1 mo to see if needs an intra-articular injection  OBJECTIVE:  Note: Objective measures were completed at Evaluation unless otherwise noted.  DIAGNOSTIC FINDINGS:  X-ray of Rt hip on 06/05/23 images right hip shows severe osteoarthritis  PATIENT SURVEYS:  LEFS: 39/80 - hardest activities are heavy activiites at home, getting in/out of car, sitting x1 hour, rolling over in bed (all rated 2)  COGNITION: Overall cognitive status: Within functional limits for tasks assessed     SENSATION: WFL  MUSCLE LENGTH:   POSTURE:  scoliosis  PALPATION: Adductors tight and tender Piriformis and gluteals tight and tender  LUMBAR ROM:  All WNL and painfree  LOWER EXTREMITY ROM:     Left hip full ROM and painfree  Passive  Right eval Left eval  Hip flexion 90 deg, pain   Hip extension    Hip abduction 40 deg, pain   Hip adduction    Hip internal rotation To neutral from ER, pain   Hip external rotation Tight end range   Knee flexion WNL   Knee extension WNL   Ankle dorsiflexion    Ankle plantarflexion    Ankle inversion    Ankle eversion     (Blank rows = not tested)  LOWER EXTREMITY MMT:    MMT Right eval Left eval  Hip flexion 4- 4+  Hip extension 4 4+  Hip abduction 3+ 4  Hip adduction 4- 5  Hip internal rotation 3+ 4+  Hip external rotation 3+ 4+  Knee flexion 4 4+  Knee extension 4+ 4+  Ankle dorsiflexion    Ankle plantarflexion    Ankle inversion    Ankle eversion     (Blank rows = not tested)    FUNCTIONAL TESTS:  5 times sit to stand: 14.49 no  UE assist, anterior hip and thigh pain Timed up and go (TUG): 7.42  GAIT: Distance walked:  Assistive device utilized: None Level of assistance: Complete Independence Comments: Rt hip is ER, Pt using hip adductors as flexors, shorter stride length on Rt  TREATMENT DATE:  07/06/23: R foot on 2nd step hip ER and flexing down toward floor 2 x 30 sec Quadruped hip ADDuctor stretch x 10 B sitting bottom back Supine  hip flexor stretch 2 x 30 sec  Hip flexor strengthening Worked on self hip distraction using purple power cord and patient scooting bwd - feels better with hip in partial flexion and ABD versus having cord at hip height.  Manual: TPR to R psoas;  Inferior hip mobs with R knee in flexion Supine hip long axis distraction right grade IV/V x 4 minutes Prone PA R hip mobs; also stabilized ant glide with contract/relax of piriformis; PROM in to R hip IR/ER - less pain with IR when ant glide stabilized UPA mobs B lumbar for psoas  release   06/29/23: NuStep L3 x 5 min (L5 caused some pain with pushing)  PT present to discuss status Squat to chair x 10 Sit to stand x 5 - feels all the way down leg Quadruped hip ADDuctor stretch x 10 B sitting bottom back R foot on 2nd step hip ER and flexing down toward floor 2 x 30 sec Worked on self hip distraction using purple power cord and patient scooting bwd - feels better with hip in partial flexion and ABD versus having cord at hip height.  Manual: TPR to R psoas;  Inferior hip mobs with R knee in flexion Supine hip long axis distraction right grade IV/V x 4 minutes Prone PA R hip mobs; also stabilized ant glide with contract/relax of piriformis; PROM in to R hip IR/ER - less pain with IR when ant glide stabilized UPA mobs B lumbar for psoas release Supine  hip flexor stretch 2 x 30 sec   06/22/23: NuStep 2' L5 PT present to discuss status Quadruped hip ADDuctor stretch x 10 B sitting bottom back Attempted eccentric hip flexor strengthening in modified Thomas position, but painful. Better with TA contraction, but still difficult. Manual: TPR to R psoas;  UPA mobs B lumbar for psoas release TPR to R QL in S/L Supine hip long axis distraction right grade  Supine lateral hip distraction with gait belt   Seated hip flexor stretch 2 x 30 sec  Lt SL: Rt clam A/ROM 2x10, Rt hip abd 2x10 A/ROM with exhale on lift and TA indraw self release with ball to R psoas in prone Prone press ups x 10  ASSESSMENT:  CLINICAL IMPRESSION: Patient reports no pain today upon arrival. She feels that PT, DN and [place] are all helping her pain. She continues to experience pain with pressing through the hip, ADDucting the hip (as when getting in a car) and with hip flexion, especially when in a supine position. Pain is worse in the middle of the night and in the morning and feels better once she is up and moving. She responds very well to LLD with decreased pain and improved mobility following. She  also has increased soreness in the R QL after prolonged LLD inidcating the QL and lumbar paraspinals on the right are tight. She also had decreased pain with ant glide of the greater trochanter combined with IR in prone. Her event is this weekend, so she cancelled Friday's appointment. She would benefit from ongoing  hip mobilizations and the addition of hip flexor strengthening as tolerated at her next visit.      Eval:Patient is a 68 y.o. female who was seen today for physical therapy evaluation and treatment for Rt hip pain.  She is very fit and active but has had several falls related to stepping up onto high step/ledge where the Rt hip gave out.  Hip pain started with 2nd fall 3 mos ago and she just learned yesterday with an X-ray that she has severe bone-on-bone Rt hip OA.  Pt presents with Rt hip resting in ER with signif pain and ROM restriction moving towards IR to get hip to neutral.  She has groin pain and buttock pain and it radiates into anterior thigh.  Her LE positioning is causing her adductors to act more as flexors which are being overused and have signif tightness and tenderness.  DN and stretching in recent past has helped.  Pt presents with abnormal alignment of Rt hip with signif ROM restrictions with pain related to OA.  She has painful weakness surrounding the hip.  Pain is worse with marching, hip abd movements, climbing steeper stairs, squatting, and rolling over in bed at night.  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  Pt will benefit from skilled PT to address findings and maximize participation in daily activities.  OBJECTIVE IMPAIRMENTS: Abnormal gait, decreased activity tolerance, decreased balance, decreased knowledge of condition, decreased mobility, decreased ROM, decreased strength, hypomobility, increased muscle spasms, impaired flexibility, postural dysfunction, and pain.   ACTIVITY LIMITATIONS:  carrying, lifting, bending, sitting, squatting, sleeping, stairs, transfers, and locomotion level  PARTICIPATION LIMITATIONS: cleaning, driving, community activity, occupation, and yard work  PERSONAL FACTORS: 1 comorbidity: falls related to weakness of Rt hip are also affecting patient's functional outcome.   REHAB POTENTIAL: Excellent  CLINICAL DECISION MAKING: Stable/uncomplicated  EVALUATION COMPLEXITY: Low   GOALS: Goals reviewed with patient? Yes  SHORT TERM GOALS: Target date: 07/03/23  Pt will be ind with initial HEP without exacerbation of pain Baseline: Goal status: IN PROGRESS 6/16 (fig 4  and ADDuction hurt)  2.  Pt will be able to squat and march to climb stairs in sagittal plane with minimal pain Baseline:  Goal status: PARTIALLY MET for stairs 06/29/23  3.  Pt will express understanding of movement as medicine and take frequent breaks from static positions to avoid stiffness related pain Baseline:  Goal status: INITIAL    LONG TERM GOALS: Target date: 07/31/23  Pt will be ind with advanced HEP and understand importance of compliance upon d/c from PT Baseline:  Goal status: INITIAL  2.  Pt will improve LEFS by at least 9 points to demo improved function Baseline: 39/80, goal will be met at 48/80 Goal status: INITIAL  3.  Pt will report pain not to exceed 4/10 with getting in and out of car Baseline: 7/10 Goal status: INITIAL  4.  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  Pt will improve Rt hip strength to allow her to step up onto 8 inch step while holding 10lb box to simulate work tasks Baseline:  Goal status: INITIAL  6.  Pt will report pain with daily activities to improve by at least 50% Baseline:  Goal status: INITIAL  PLAN:  PT FREQUENCY: 2x/week  PT DURATION: 8 weeks  PLANNED INTERVENTIONS: 97110-Therapeutic exercises,  97530- Therapeutic activity, [id number]- Neuromuscular re-education, [phone]- Self Care, [id number]- Manual therapy, [phone]- Gait training, [phone]- Aquatic Therapy, [phone]- Electrical stimulation (unattended), [phone]- Ionotophoresis 4mg /ml Dexamethasone , Patient/Family education, Balance training, Stair training, Taping, Dry Needling, Joint mobilization, Spinal mobilization, Cryotherapy, and Moist heat.  PLAN FOR NEXT SESSION: How did she mange during her event. Continue with LLD and ant hip mobs, Rt psoas strength and ROM, flexibility of adductors, piriformis, gluteals, Rt hip and core strength for functional task performance as tol, Pt needs to be strong enough to step up onto taller ledges for work while carrying items   Mliss Cummins, PT  07/07/23 9:47 PM

## 2023-07-08 ENCOUNTER — Telehealth: Payer: Self-pay | Admitting: Physical Therapy

## 2023-07-08 ENCOUNTER — Ambulatory Visit: Admitting: Family Medicine

## 2023-07-08 ENCOUNTER — Encounter: Payer: Self-pay | Admitting: Family Medicine

## 2023-07-08 ENCOUNTER — Ambulatory Visit: Admitting: Physical Therapy

## 2023-07-08 VITALS — BP 110/58 | HR 97 | Ht 64.0 in | Wt 121.0 lb

## 2023-07-08 DIAGNOSIS — M79604 Pain in right leg: Secondary | ICD-10-CM

## 2023-07-08 DIAGNOSIS — M1611 Unilateral primary osteoarthritis, right hip: Secondary | ICD-10-CM | POA: Diagnosis not present

## 2023-07-08 DIAGNOSIS — M25551 Pain in right hip: Secondary | ICD-10-CM

## 2023-07-08 NOTE — Progress Notes (Signed)
   I, Leotis Batter, CMA acting as a scribe for Artist Lloyd, MD.  Lauren [name] is a 68 y.o. female who presents to [facility] today for cont'd R hip pain. Pt was last seen by Dr. Lloyd on 06/04/23 and was referred to PT, completing 5 visits.  Today, pt reports continued hip pain at night. Has been busy planning a festive, feels this may have exacerbated of sciatic-type pain. Pt has been help for hip sx, gradually improvement. Takes IBU prn. Would like to have  MRI of the hip ordered. Denies weakness in the R LE.   Dx imaging: 06/04/23 R hip XR  Pertinent review of systems: No fevers or chills  Relevant historical information: History of a perforated bowel.   Exam:  BP (!) 110/58   Pulse 97   Ht 5' 4 (1.626 m)   Wt 121 lb (54.9 kg)   SpO2 97%   BMI 20.77 kg/m  General: Well Developed, well nourished, and in no acute distress.   MSK: Right hip normal-appearing normal gait.  L-spine nontender palpation midline.  Decreased lumbar motion.   Lab and Radiology Results   EXAM: DG HIP (WITH OR WITHOUT PELVIS) 2-3V RIGHT   COMPARISON:  CT abdomen and pelvis 01/27/2023.   FINDINGS: No acute bony or joint abnormality is identified. The patient has severe right hip osteoarthritis with bone-on-bone joint space narrowing, subchondral sclerosis and bulky osteophytosis. The left hip appears normal. Soft tissues are negative.   IMPRESSION: Severe right hip osteoarthritis.  No acute abnormality.     Electronically Signed   By: Debby Prader M.D.   On: 06/04/2023 14:19 I, Artist Lloyd, personally (independently) visualized and performed the interpretation of the images attached in this note.  CT scan images lumbar spine visible on CT scan abdomen and pelvis January 27, 2023 personally independent interpreted. Scoliosis is present with degenerative changes including facet DJD and DDD.     Assessment and Plan: 68 y.o. female with chronic  right low back pain posterior hip pain.  Patient does have severe hip DJD on x-ray but does not have much anterior hip pain which I would typically expect.  She is improving with physical therapy.  Plan for continued home exercise program and PT.  Consider diagnostic and therapeutic hip injection in the future.  Consider MRI lumbar spine and right hip if needed as well. For now she wants to hold off on these interventions and continue home exercise program and PT.  PDMP not reviewed this encounter. No orders of the defined types were placed in this encounter.  No orders of the defined types were placed in this encounter.    Discussed warning signs or symptoms. Please see discharge instructions. Patient expresses understanding.   The above documentation has been reviewed and is accurate and complete Artist Lloyd, M.D.

## 2023-07-08 NOTE — Patient Instructions (Signed)
 Thank you for coming in today.   Let's give physical therapy some more time  Let me know if you would like to try an injection or a surgical consultation.

## 2023-07-08 NOTE — Telephone Encounter (Signed)
 Phoned pt regarding missed appt and reminded her of appt on 6/30.

## 2023-07-09 ENCOUNTER — Ambulatory Visit: Admitting: Family Medicine

## 2023-07-09 IMAGING — MG MM DIGITAL SCREENING BILAT W/ TOMO AND CAD
8 series · 9 of 24 positions shown · non-contrast
Comparison: Previous exam(s).

CLINICAL DATA: Screening.

EXAM:
DIGITAL SCREENING BILATERAL MAMMOGRAM WITH TOMOSYNTHESIS AND CAD
TECHNIQUE: Bilateral screening digital craniocaudal and mediolateral oblique
mammograms were obtained. Bilateral screening digital breast
tomosynthesis was performed. The images were evaluated with
computer-aided detection.

[R CC synth-2D]
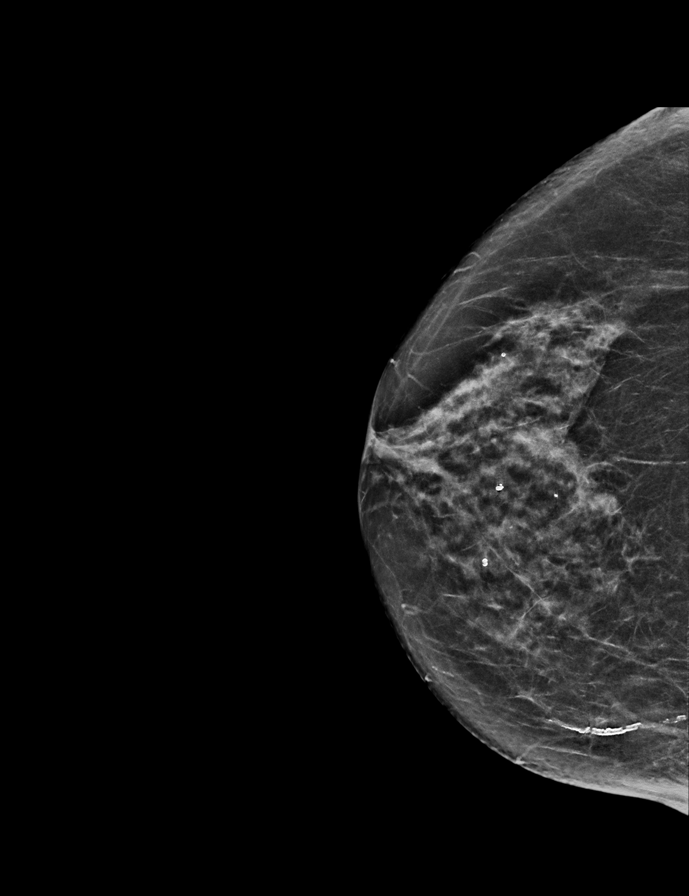

[L MLO synth-2D]
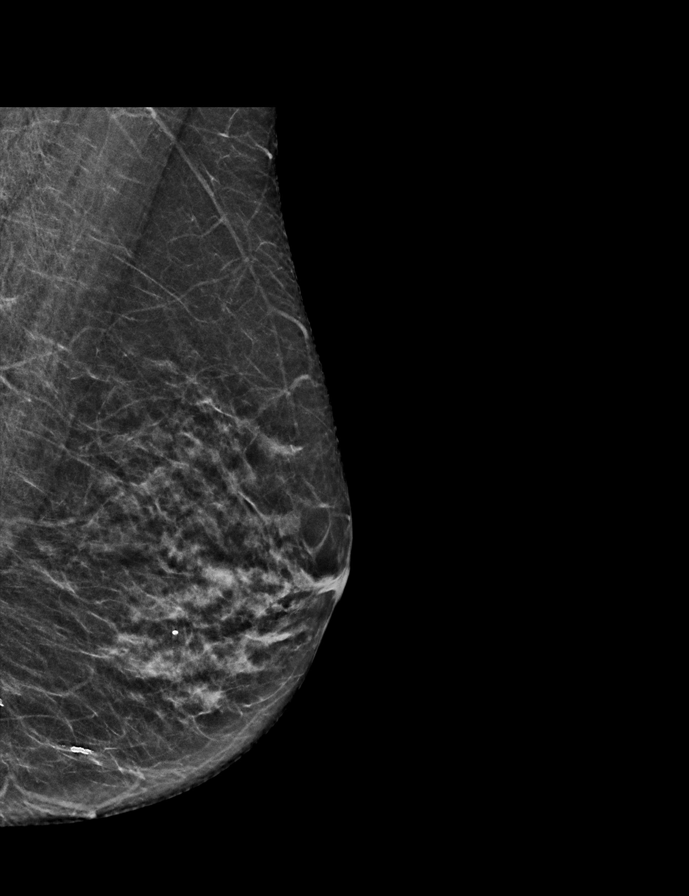

[L CC synth-2D]
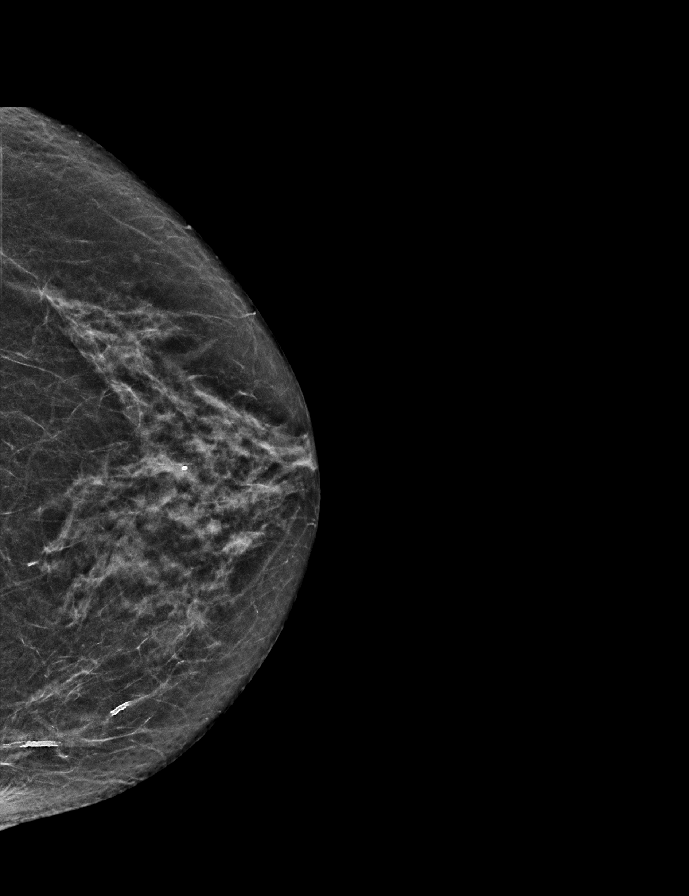

[R MLO synth-2D]
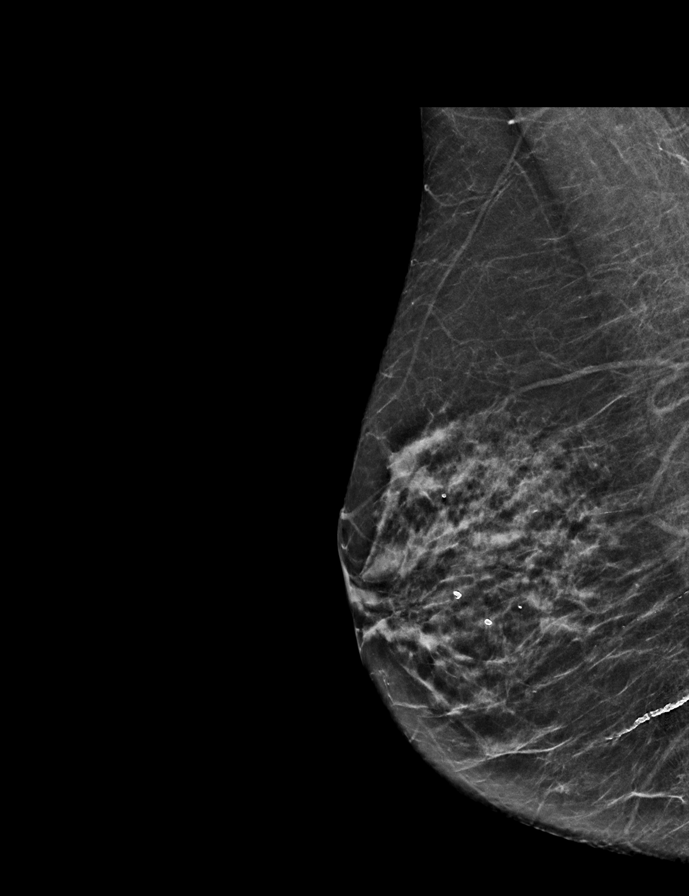

[L CC tomo · 2 of 47 frames shown]
[frame 16/47]
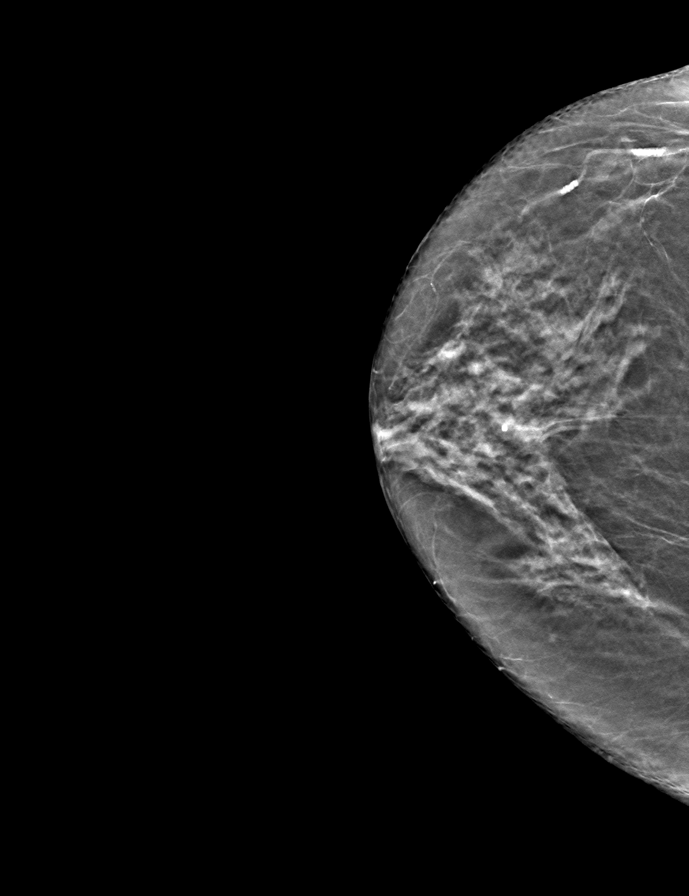
[frame 24/47]
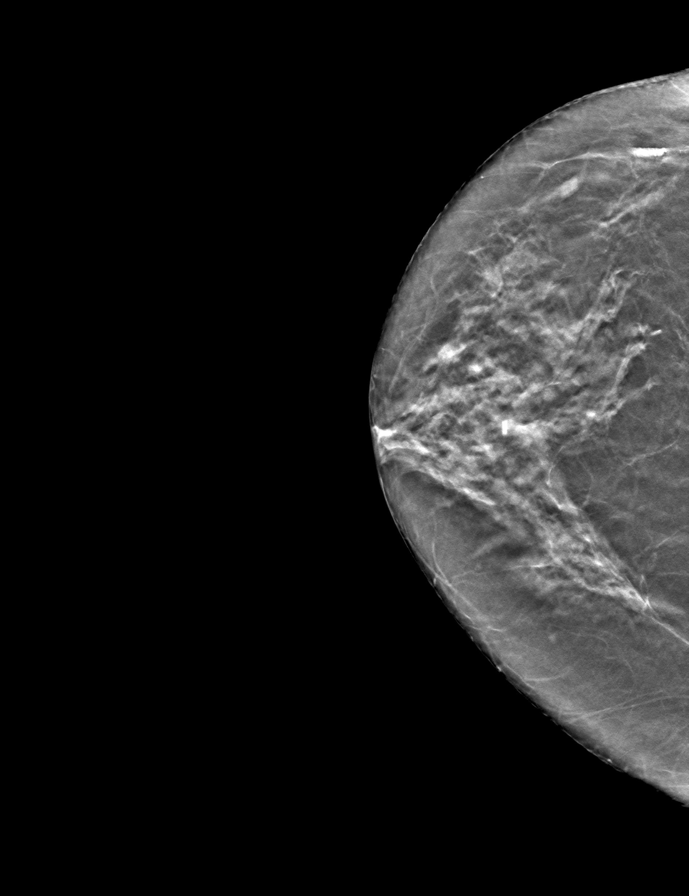

[R CC tomo · tomo slice 28/55.0]
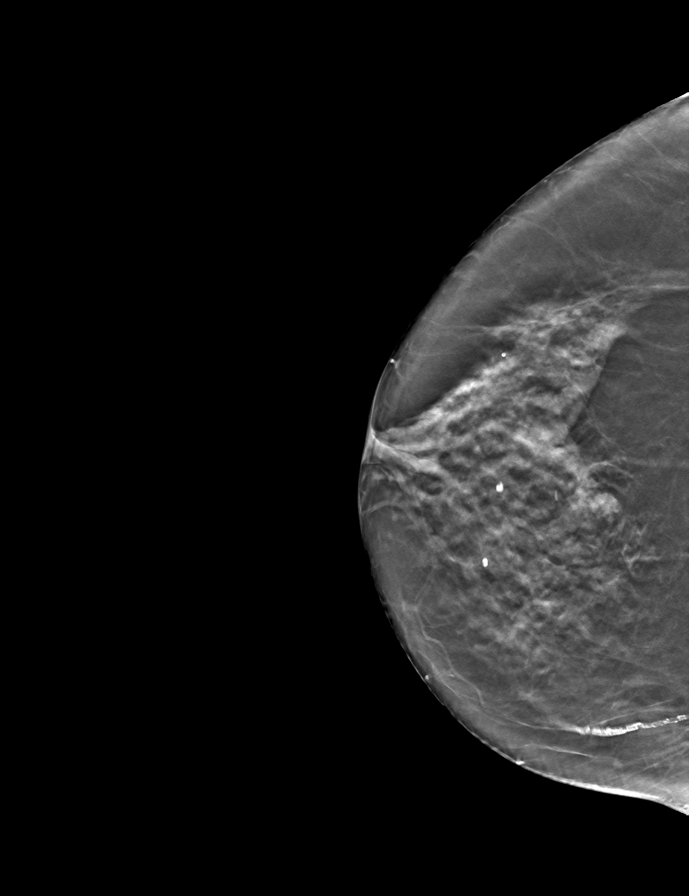

[R MLO tomo · tomo slice 27/52.0]
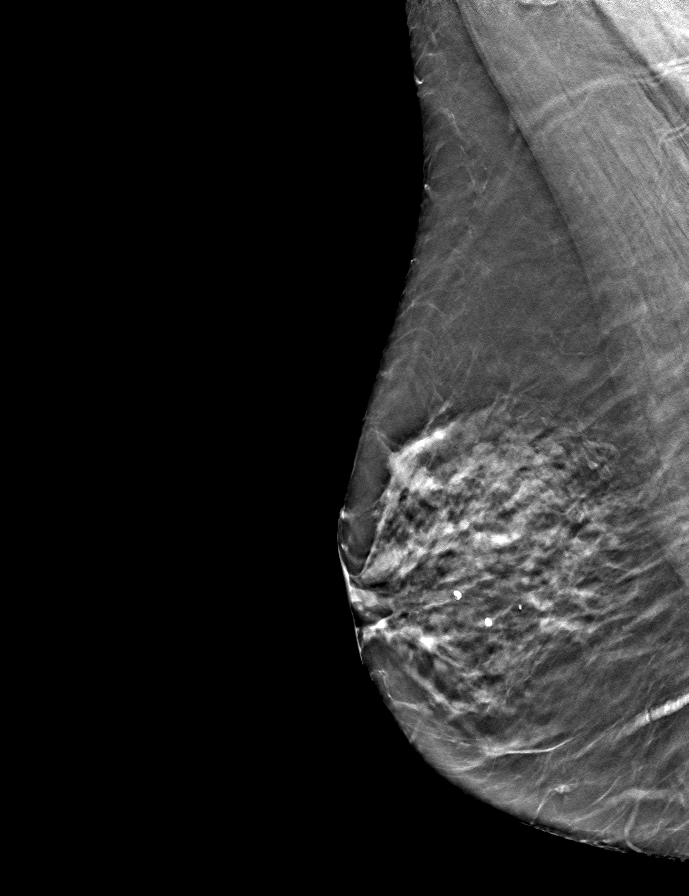

[L MLO tomo · tomo slice 24/47.0]
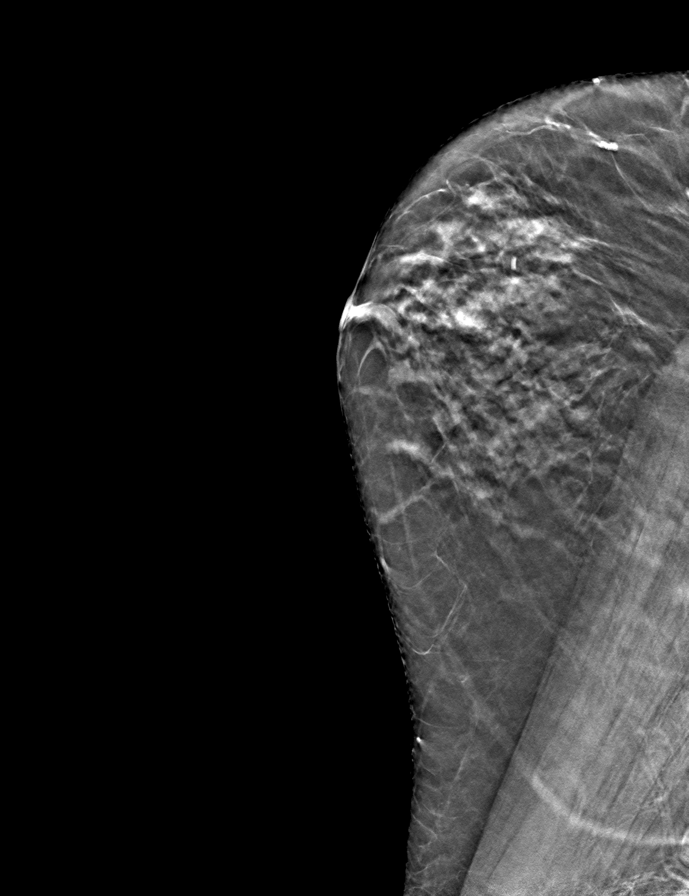

[9 of 24 positions shown; findings below may reference images not displayed]

ACR Breast Density Category c: The breast tissue is heterogeneously
dense, which may obscure small masses.
FINDINGS: There are no findings suspicious for malignancy.
IMPRESSION: No mammographic evidence of malignancy. A result letter of this
screening mammogram will be mailed directly to the patient.

RECOMMENDATION:
Screening mammogram in one year. (Code:Q3-[id number])

BI-RADS CATEGORY  1: Negative.

## 2023-07-12 NOTE — Therapy (Signed)
 OUTPATIENT PHYSICAL THERAPY LOWER EXTREMITY TREATMENT NOTE   Patient Name: Lauren [name] MRN: [id number] DOB:1955-04-07, 68 y.o., female Today's Date: 07/13/2023  END OF SESSION:  PT End of Session - 07/13/23 1021     Visit Number 6    Date for PT Re-Evaluation 07/31/23    Authorization Type [employer] - medical necessity    Progress Note Due on Visit 10    PT Start Time 1021    PT Stop Time 1100    PT Time Calculation (min) 39 min    Activity Tolerance Patient tolerated treatment well    Behavior During Therapy [facility] for tasks assessed/performed              Past Medical History:  Diagnosis Date   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   Chicken pox as a child   Depression    been on antidepressants on and off for 20 yr   GERD (gastroesophageal reflux disease)    silent with hiatal hernia   Hiatal hernia 05/22/2013   Hiatal hernia with gastroesophageal reflux 05/22/2013   Hot tub folliculitis 12/18/2013   Hyperglycemia 05/19/2016   Hyperlipidemia 05/19/2016   Measles as a child   Postmenopausal atrophic vaginitis 05/22/2013   Preventative health care 01/19/2015   Shingles 68 yrs old   Unspecified vitamin D  deficiency 05/22/2013   Past Surgical History:  Procedure Laterality Date   ABDOMINAL HYSTERECTOMY  1998   partial-still has ovaries   BILIARY STENT PLACEMENT N/A 11/23/2019   Procedure: BILIARY STENT PLACEMENT;  Surgeon: Rollin Dover, MD;  Location: [facility];  Service: Endoscopy;  Laterality: N/A;   CHOLECYSTECTOMY N/A 01/27/2023   Procedure: OPEN CHOLECYSTECTOMY, DRAINAGE OF INTRA-ABDOMINAL ABSCESS;  Surgeon: Ebbie Cough, MD;  Location: [facility];  Service: General;  Laterality: N/A;   ENDOSCOPIC RETROGRADE CHOLANGIOPANCREATOGRAPHY (ERCP) WITH PROPOFOL  N/A 06/01/2020   Procedure: ENDOSCOPIC RETROGRADE CHOLANGIOPANCREATOGRAPHY (ERCP) WITH PROPOFOL ;  Surgeon: Rollin Dover, MD;  Location: [facility];  Service: Endoscopy;  Laterality: N/A;   ERCP N/A  11/23/2019   Procedure: ENDOSCOPIC RETROGRADE CHOLANGIOPANCREATOGRAPHY (ERCP);  Surgeon: Rollin Dover, MD;  Location: [facility];  Service: Endoscopy;  Laterality: N/A;   ESOPHAGECTOMY  05-2008   INDUCED ABORTION  1979   REMOVAL OF STONES  11/23/2019   Procedure: REMOVAL OF STONES;  Surgeon: Rollin Dover, MD;  Location: [facility];  Service: Endoscopy;;   SPHINCTEROTOMY  11/23/2019   Procedure: ANNETT;  Surgeon: Rollin Dover, MD;  Location: [facility];  Service: Endoscopy;;   STENT REMOVAL  06/01/2020   Procedure: STENT REMOVAL;  Surgeon: Rollin Dover, MD;  Location: [facility];  Service: Endoscopy;;   Patient Active Problem List   Diagnosis Date Noted   Primary osteoarthritis of right hip 06/04/2023   Protein-calorie malnutrition, severe 01/29/2023   Perforated bowel (HCC) 01/26/2023   Perforated gallbladder 01/26/2023   Dysphagia 10/17/2022   Esophageal stricture 10/17/2022   History of malignant neoplasm of esophagus 10/17/2022   Leg pain 06/25/2022   Elevated liver enzymes 07/25/2021   Memory changes 10/15/2020   Ulnar neuropathy 12/27/2019   Abnormal LFTs    Choledocholithiasis with obstruction 11/22/2019   Mass of skin of left thumb 08/31/2019   Mucoid cyst, joint 08/31/2019   Osteoarthritis of finger of left hand 08/31/2019   Hyperglycemia 05/19/2016   Hyperlipidemia 05/19/2016   Hiatal hernia with gastroesophageal reflux 05/22/2013   Postmenopausal atrophic vaginitis 05/22/2013   Vitamin D  deficiency 05/22/2013   Chicken pox    History of shingles  Measles    Cancer (HCC)    Depression     PCP: Glade Horton, MD  REFERRING PROVIDER: Joane Artist RAMAN, MD  REFERRING DIAG: [phone] (ICD-10-CM) - Right leg pain M25.551 (ICD-10-CM) - Right hip pain  Rationale for Evaluation and Treatment: Rehabilitation  THERAPY DIAG:  Pain in right hip  Muscle weakness (generalized)  ONSET DATE: approx 3 mos ago - has had some falls  SUBJECTIVE:                                                                                                                                                                                            SUBJECTIVE STATEMENT: It hasn't been terrible. I'm not pushing it. Not going to the gym, moving slower, babying it a little. Taking meds if gets bad. The pain is better since she has been coning to PT.   EVAL: Pt referred to PT for Rt hip pain.  Has had multiple falls which have contributed.  One fall about 9 mos ago and another 3 mos ago.  Hip gave out going up a big step and caused the fall.  Xrays done yesterday show severe OA of Rt hip.  In past has had massage, DN and stretching at [facility] which helped. She has no LBP. 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  Right now planning the [employer].  I also go to [employer] to lift weights but have taken a break from that b/c the machines were hurting my hip.    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 She had an esophagectomy in 2010. She has been in remission now for 15 years  PAIN:  PAIN:  Are you having pain? Yes NPRS scale: 6/10 Pain location: Rt buttock  Pain orientation: Right  PAIN TYPE: aching, dull, and sharp Pain description: intermittent  Aggravating factors: hip abduction, getting out of the car, rolling in bed at night, climbing deep stairs Relieving factors: ice, stretching, hot tub, DN   PRECAUTIONS: Fall  RED FLAGS: None   WEIGHT BEARING RESTRICTIONS: No  FALLS:  Has patient fallen in last 6 months? Yes. Number of falls 1  LIVING ENVIRONMENT: Lives with: lives with their spouse Lives in: House/apartment Stairs: Yes: Internal: 13 steps; on right going up and External: 4 steps; on left going up Has following equipment at home: None  OCCUPATION: self-employed - throws outdoor festivals, part-time   PLOF: Independent  PATIENT GOALS: be lighter on my feet, less pain, get  stronger  NEXT MD VISIT: approx 1 mo to see if needs an intra-articular injection  OBJECTIVE:  Note: Objective measures were completed at Evaluation unless otherwise noted.  DIAGNOSTIC FINDINGS:  X-ray of Rt hip on 06/05/23 images right hip shows severe osteoarthritis  PATIENT SURVEYS:  LEFS: 39/80 - hardest activities are heavy activiites at home, getting in/out of car, sitting x1 hour, rolling over in bed (all rated 2)  COGNITION: Overall cognitive status: Within functional limits for tasks assessed     SENSATION: WFL  MUSCLE LENGTH:   POSTURE: scoliosis  PALPATION: Adductors tight and tender Piriformis and gluteals tight and tender  LUMBAR ROM:  All WNL and painfree  LOWER EXTREMITY ROM:     Left hip full ROM and painfree  Passive  Right eval Left eval  Hip flexion 90 deg, pain   Hip extension    Hip abduction 40 deg, pain   Hip adduction    Hip internal rotation To neutral from ER, pain   Hip external rotation Tight end range   Knee flexion WNL   Knee extension WNL   Ankle dorsiflexion    Ankle plantarflexion    Ankle inversion    Ankle eversion     (Blank rows = not tested)  LOWER EXTREMITY MMT:    MMT Right eval Left eval  Hip flexion 4- 4+  Hip extension 4 4+  Hip abduction 3+ 4  Hip adduction 4- 5  Hip internal rotation 3+ 4+  Hip external rotation 3+ 4+  Knee flexion 4 4+  Knee extension 4+ 4+  Ankle dorsiflexion    Ankle plantarflexion    Ankle inversion    Ankle eversion     (Blank rows = not tested)    FUNCTIONAL TESTS:  5 times sit to stand: 14.49 no UE assist, anterior hip and thigh pain Timed up and go (TUG): 7.42  GAIT: Distance walked:  Assistive device utilized: None Level of assistance: Complete Independence Comments: Rt hip is ER, Pt using hip adductors as flexors, shorter stride length on Rt  TREATMENT DATE:  07/13/23: NuStep L5 x 5 min (L5 caused some pain with pushing)  PT present to discuss status R foot on  2nd step hip ER and flexing down toward floor 2 x 30 sec Hip flexor stretch on 2nd step 2x 30 sec with arm OH (no side bend) Prone lying abolishes pain Prone press ups x 10 feels good Quadruped alt leg extension - hurts R hip and unable to keep left hip level Prone over pillow hip ext x 10 B Prone alternating opp arm/leg x 10 Quadruped hip ADDuctor stretch 2x 30 sec sitting bottom back Hip flexor strengthening in modified thomas position: painful on concentric part Plank mountain climber on table x 10 B no pain Standing hip PNF not resistance - painful in D1 and D2 patterns  06/29/23: NuStep L3 x 5 min (L5 caused some pain with pushing)  PT present to discuss status Squat to chair x 10 Sit to stand x 5 - feels all the way down leg R foot on 2nd step hip ER and flexing down toward floor 2 x 30 sec Worked on self hip distraction using purple power cord and patient scooting bwd - feels better with hip in partial flexion and ABD versus having cord at hip height.  Manual: TPR to R psoas;  Inferior hip mobs with R knee in flexion Supine hip long axis distraction right grade IV/V x 4 minutes Prone PA R hip mobs; also stabilized ant glide with contract/relax of piriformis; PROM in to R hip IR/ER - less  pain with IR when ant glide stabilized UPA mobs B lumbar for psoas release Supine  hip flexor stretch 2 x 30 sec   06/22/23: NuStep 2' L5 PT present to discuss status Quadruped hip ADDuctor stretch x 10 B sitting bottom back Attempted eccentric hip flexor strengthening in modified Thomas position, but painful. Better with TA contraction, but still difficult. Manual: TPR to R psoas;  UPA mobs B lumbar for psoas release TPR to R QL in S/L Supine hip long axis distraction right grade  Supine lateral hip distraction with gait belt   Seated hip flexor stretch 2 x 30 sec  Lt SL: Rt clam A/ROM 2x10, Rt hip abd 2x10 A/ROM with exhale on lift and TA indraw self release with ball to R psoas in  prone Prone press ups x 10   PATIENT EDUCATION:  Education details: [email or web address] Person educated: Patient Education method: Explanation, Demonstration, Verbal cues, and Handouts Education comprehension: verbalized understanding and returned demonstration   HOME EXERCISE PROGRAM: Access Code: [email or web address] URL: [email or web address] Date: 07/13/2023 Prepared by: Mliss  Exercises - Hip Flexor Stretch at Edge of Bed  - 2-3 x daily - 7 x weekly - 1 sets - 2 reps - 30 hold - Supine Piriformis Stretch with Leg Straight  - 2-3 x daily - 7 x weekly - 1 sets - 2 reps - 30 hold - Figure 4 Bridge  - 1 x daily - 7 x weekly - 2 sets - 10 reps - Squat with Chair Touch  - 1 x daily - 7 x weekly - 1 sets - 10 reps - Runner's Step Up/Down  - 1 x daily - 7 x weekly - 2 sets - 10 reps - Hip Hiking on Step  - 1 x daily - 7 x weekly - 2 sets - 10 reps - Forward T with Counter Support  - 1 x daily - 7 x weekly - 2 sets - 10 reps - Sidelying Hip Abduction  - 1 x daily - 7 x weekly - 2 sets - 10 reps - Seated Hip Flexor Stretch  - 2 x daily - 7 x weekly - 1 sets - 3 reps - 30-60 sec hold - Psoas Mobilization with Small Ball (Mirrored)  - 1 x daily - 7 x weekly - 1 sets - 1 reps - 30-60 sec hold - Prone Press Up  - 3 x daily - 7 x weekly - 1-3 sets - 10 reps - Kneeling Adductor Stretch with Hip External Rotation  - 1 x daily - 3-4 x weekly - 1 sets - 3 reps - 30-60 sec hold - Prone Alternating Arm and Leg Lifts  - 1 x daily - 3 x weekly - 3 sets - 10 reps - 5 sec hold  ASSESSMENT:  CLINICAL IMPRESSION: Patient presents with no pain today in the groin. She has ongoing pain in he R Low back, but overall she reports improvements. She is able to abolish all pain in prone lying and prone press ups feel good. Patient advised to lie prone frequently throughout the day to see if she can increase her standing tolerance. She continues to have pain in the hip with hip flexion in supine and also with standing PNF  diagonals. She also had pain in quadruped when bearing weight through the R LE. Her husband is helping her with LLD at home which still gives her relief. Plan to incorporate back strengthening in addition to the hip going forward.  Eval:Patient is a 68 y.o. female who was seen today for physical therapy evaluation and treatment for Rt hip pain.  She is very fit and active but has had several falls related to stepping up onto high step/ledge where the Rt hip gave out.  Hip pain started with 2nd fall 3 mos ago and she just learned yesterday with an X-ray that she has severe bone-on-bone Rt hip OA.  Pt presents with Rt hip resting in ER with signif pain and ROM restriction moving towards IR to get hip to neutral.  She has groin pain and buttock pain and it radiates into anterior thigh.  Her LE positioning is causing her adductors to act more as flexors which are being overused and have signif tightness and tenderness.  DN and stretching in recent past has helped.  Pt presents with abnormal alignment of Rt hip with signif ROM restrictions with pain related to OA.  She has painful weakness surrounding the hip.  Pain is worse with marching, hip abd movements, climbing steeper stairs, squatting, and rolling over in bed at night.  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  Pt will benefit from skilled PT to address findings and maximize participation in daily activities.  OBJECTIVE IMPAIRMENTS: Abnormal gait, decreased activity tolerance, decreased balance, decreased knowledge of condition, decreased mobility, decreased ROM, decreased strength, hypomobility, increased muscle spasms, impaired flexibility, postural dysfunction, and pain.   ACTIVITY LIMITATIONS: carrying, lifting, bending, sitting, squatting, sleeping, stairs, transfers, and locomotion level  PARTICIPATION LIMITATIONS: cleaning, driving, community activity, occupation, and  yard work  PERSONAL FACTORS: 1 comorbidity: falls related to weakness of Rt hip are also affecting patient's functional outcome.   REHAB POTENTIAL: Excellent  CLINICAL DECISION MAKING: Stable/uncomplicated  EVALUATION COMPLEXITY: Low   GOALS: Goals reviewed with patient? Yes  SHORT TERM GOALS: Target date: 07/03/23  Pt will be ind with initial HEP without exacerbation of pain Baseline: Goal status: IN PROGRESS 6/16 (fig 4  and ADDuction hurt)  2.  Pt will be able to squat and march to climb stairs in sagittal plane with minimal pain Baseline:  Goal status: PARTIALLY MET for stairs 06/29/23  3.  Pt will express understanding of movement as medicine and take frequent breaks from static positions to avoid stiffness related pain Baseline:  Goal status: MET    LONG TERM GOALS: Target date: 07/31/23  Pt will be ind with advanced HEP and understand importance of compliance upon d/c from PT Baseline:  Goal status: INITIAL  2.  Pt will improve LEFS by at least 9 points to demo improved function Baseline: 39/80, goal will be met at 48/80 Goal status: INITIAL  3.  Pt will report pain not to exceed 4/10 with getting in and out of car Baseline: 7/10 Goal status: INITIAL  4.  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  Pt will improve Rt hip strength to allow her to step up onto 8 inch step while holding 10lb box to simulate work tasks Baseline:  Goal status: INITIAL  6.  Pt will report pain with daily activities to improve by at least 50% Baseline:  Goal status: INITIAL  PLAN:  PT FREQUENCY: 2x/week  PT DURATION: 8 weeks  PLANNED INTERVENTIONS: 97110-Therapeutic exercises, 97530- Therapeutic activity, [id number]- Neuromuscular re-education, 97535- Self Care, [id number]- Manual therapy, [id number]- Gait training, [phone]-  Aquatic Therapy, G0283- Electrical stimulation  (unattended), [phone]- Ionotophoresis 4mg /ml Dexamethasone , Patient/Family education, Balance training, Stair training, Taping, Dry Needling, Joint mobilization, Spinal mobilization, Cryotherapy, and Moist heat.  PLAN FOR NEXT SESSION: Continue with LLD and ant hip mobs, Rt psoas strength and ROM, flexibility of adductors, piriformis, gluteals, Rt hip and core strength for functional task performance as tol, Pt needs to be strong enough to step up onto taller ledges for work while carrying [employer], PT  07/13/23 2:40 PM

## 2023-07-13 ENCOUNTER — Ambulatory Visit: Admitting: Physical Therapy

## 2023-07-13 ENCOUNTER — Encounter: Payer: Self-pay | Admitting: Physical Therapy

## 2023-07-13 ENCOUNTER — Encounter: Payer: Medicare HMO | Admitting: Family Medicine

## 2023-07-13 DIAGNOSIS — M25551 Pain in right hip: Secondary | ICD-10-CM | POA: Diagnosis not present

## 2023-07-13 DIAGNOSIS — M6281 Muscle weakness (generalized): Secondary | ICD-10-CM

## 2023-07-16 ENCOUNTER — Ambulatory Visit: Attending: Family Medicine | Admitting: Physical Therapy

## 2023-07-16 DIAGNOSIS — M25551 Pain in right hip: Secondary | ICD-10-CM | POA: Insufficient documentation

## 2023-07-16 DIAGNOSIS — M6281 Muscle weakness (generalized): Secondary | ICD-10-CM | POA: Diagnosis present

## 2023-07-16 NOTE — Therapy (Signed)
 OUTPATIENT PHYSICAL THERAPY LOWER EXTREMITY TREATMENT NOTE   Patient Name: JAISHA VILLACRES MRN: [id number] DOB:Nov 18, 1955, 68 y.o., female Today's Date: 07/16/2023  END OF SESSION:  PT End of Session - 07/16/23 1146     Visit Number 7    Date for PT Re-Evaluation 07/31/23    Authorization Type [employer] - medical necessity    Progress Note Due on Visit 10    PT Start Time 1146    PT Stop Time 1230    PT Time Calculation (min) 44 min    Activity Tolerance Patient tolerated treatment well              Past Medical History:  Diagnosis Date   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   Chicken pox as a child   Depression    been on antidepressants on and off for 20 yr   GERD (gastroesophageal reflux disease)    silent with hiatal hernia   Hiatal hernia 05/22/2013   Hiatal hernia with gastroesophageal reflux 05/22/2013   Hot tub folliculitis 12/18/2013   Hyperglycemia 05/19/2016   Hyperlipidemia 05/19/2016   Measles as a child   Postmenopausal atrophic vaginitis 05/22/2013   Preventative health care 01/19/2015   Shingles 68 yrs old   Unspecified vitamin D  deficiency 05/22/2013   Past Surgical History:  Procedure Laterality Date   ABDOMINAL HYSTERECTOMY  1998   partial-still has ovaries   BILIARY STENT PLACEMENT N/A 11/23/2019   Procedure: BILIARY STENT PLACEMENT;  Surgeon: Rollin Dover, MD;  Location: [facility];  Service: Endoscopy;  Laterality: N/A;   CHOLECYSTECTOMY N/A 01/27/2023   Procedure: OPEN CHOLECYSTECTOMY, DRAINAGE OF INTRA-ABDOMINAL ABSCESS;  Surgeon: Ebbie Cough, MD;  Location: [facility];  Service: General;  Laterality: N/A;   ENDOSCOPIC RETROGRADE CHOLANGIOPANCREATOGRAPHY (ERCP) WITH PROPOFOL  N/A 06/01/2020   Procedure: ENDOSCOPIC RETROGRADE CHOLANGIOPANCREATOGRAPHY (ERCP) WITH PROPOFOL ;  Surgeon: Rollin Dover, MD;  Location: [facility];  Service: Endoscopy;  Laterality: N/A;   ERCP N/A 11/23/2019   Procedure: ENDOSCOPIC RETROGRADE  CHOLANGIOPANCREATOGRAPHY (ERCP);  Surgeon: Rollin Dover, MD;  Location: [facility];  Service: Endoscopy;  Laterality: N/A;   ESOPHAGECTOMY  05-2008   INDUCED ABORTION  1979   REMOVAL OF STONES  11/23/2019   Procedure: REMOVAL OF STONES;  Surgeon: Rollin Dover, MD;  Location: [facility];  Service: Endoscopy;;   SPHINCTEROTOMY  11/23/2019   Procedure: ANNETT;  Surgeon: Rollin Dover, MD;  Location: [facility];  Service: Endoscopy;;   STENT REMOVAL  06/01/2020   Procedure: STENT REMOVAL;  Surgeon: Rollin Dover, MD;  Location: [facility];  Service: Endoscopy;;   Patient Active Problem List   Diagnosis Date Noted   Primary osteoarthritis of right hip 06/04/2023   Protein-calorie malnutrition, severe 01/29/2023   Perforated bowel (HCC) 01/26/2023   Perforated gallbladder 01/26/2023   Dysphagia 10/17/2022   Esophageal stricture 10/17/2022   History of malignant neoplasm of esophagus 10/17/2022   Leg pain 06/25/2022   Elevated liver enzymes 07/25/2021   Memory changes 10/15/2020   Ulnar neuropathy 12/27/2019   Abnormal LFTs    Choledocholithiasis with obstruction 11/22/2019   Mass of skin of left thumb 08/31/2019   Mucoid cyst, joint 08/31/2019   Osteoarthritis of finger of left hand 08/31/2019   Hyperglycemia 05/19/2016   Hyperlipidemia 05/19/2016   Hiatal hernia with gastroesophageal reflux 05/22/2013   Postmenopausal atrophic vaginitis 05/22/2013   Vitamin D  deficiency 05/22/2013   Chicken pox    History of shingles    Measles    Cancer (HCC)  Depression     PCP: Glade Horton, MD  REFERRING PROVIDER: Joane Artist RAMAN, MD  REFERRING DIAG: [phone] (ICD-10-CM) - Right leg pain M25.551 (ICD-10-CM) - Right hip pain  Rationale for Evaluation and Treatment: Rehabilitation  THERAPY DIAG:  Pain in right hip  Muscle weakness (generalized)  ONSET DATE: approx 3 mos ago - has had some falls  SUBJECTIVE:                                                                                                                                                                                            SUBJECTIVE STATEMENT: The pain has not been bad.  I saw Dr. Joane and he recommended imaging on back and hip.  Some upcoming trips.  It's getting better.  Worse at night. During the day it's OK. Leg pull gives me relief.  Going away for 2 weeks.  Now my foot doesn't turn out to the side     EVAL: Pt referred to PT for Rt hip pain.  Has had multiple falls which have contributed.  One fall about 9 mos ago and another 3 mos ago.  Hip gave out going up a big step and caused the fall.  Xrays done yesterday show severe OA of Rt hip.  In past has had massage, DN and stretching at [facility] which helped. She has no LBP. 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  Right now planning the [employer].  I also go to [employer] to lift weights but have taken a break from that b/c the machines were hurting my hip.    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 She had an esophagectomy in 2010. She has been in remission now for 15 years  PAIN:  PAIN:  Are you having pain? Yes NPRS scale: 2/10 Pain location: Rt knee and butt Pain orientation: Right  PAIN TYPE: aching, dull, and sharp Pain description: intermittent  Aggravating factors: hip abduction, getting out of the car, rolling in bed at night, climbing deep stairs Relieving factors: ice, stretching, hot tub, DN   PRECAUTIONS: Fall  RED FLAGS: None   WEIGHT BEARING RESTRICTIONS: No  FALLS:  Has patient fallen in last 6 months? Yes. Number of falls 1  LIVING ENVIRONMENT: Lives with: lives with their spouse Lives in: House/apartment Stairs: Yes: Internal: 13 steps; on right going up and External: 4 steps; on left going up Has following equipment at home: None  OCCUPATION: self-employed - throws outdoor festivals, part-time   PLOF: Independent  PATIENT GOALS:  be lighter on my feet, less pain, get stronger  NEXT MD VISIT: approx 1 mo to see if needs an intra-articular injection  OBJECTIVE:  Note: Objective measures were completed at Evaluation unless otherwise noted.  DIAGNOSTIC FINDINGS:  X-ray of Rt hip on 06/05/23 images right hip shows severe osteoarthritis  PATIENT SURVEYS:  LEFS: 39/80 - hardest activities are heavy activiites at home, getting in/out of car, sitting x1 hour, rolling over in bed (all rated 2)  COGNITION: Overall cognitive status: Within functional limits for tasks assessed     SENSATION: WFL  MUSCLE LENGTH:   POSTURE: scoliosis  PALPATION: Adductors tight and tender Piriformis and gluteals tight and tender  LUMBAR ROM:  All WNL and painfree  LOWER EXTREMITY ROM:     Left hip full ROM and painfree  Passive  Right eval Left eval  Hip flexion 90 deg, pain   Hip extension    Hip abduction 40 deg, pain   Hip adduction    Hip internal rotation To neutral from ER, pain   Hip external rotation Tight end range   Knee flexion WNL   Knee extension WNL   Ankle dorsiflexion    Ankle plantarflexion    Ankle inversion    Ankle eversion     (Blank rows = not tested)  LOWER EXTREMITY MMT:    MMT Right eval Left eval  Hip flexion 4- 4+  Hip extension 4 4+  Hip abduction 3+ 4  Hip adduction 4- 5  Hip internal rotation 3+ 4+  Hip external rotation 3+ 4+  Knee flexion 4 4+  Knee extension 4+ 4+  Ankle dorsiflexion    Ankle plantarflexion    Ankle inversion    Ankle eversion     (Blank rows = not tested)    FUNCTIONAL TESTS:  5 times sit to stand: 14.49 no UE assist, anterior hip and thigh pain Timed up and go (TUG): 7.42  GAIT: Distance walked:  Assistive device utilized: None Level of assistance: Complete Independence Comments: Rt hip is ER, Pt using hip adductors as flexors, shorter stride length on Rt  TREATMENT DATE:  07/16/23: NuStep L3 x 5 min  PT present to discuss status 1/2 kneel  with glute squeeze 7x then lunge forward 10x, then overhead reach to stretch QL/ITB Standing Hip thrust with heavy 50# power cord (anchored on stair railing) 20x Supine glute bridge 10x Supine right single leg glute bridge 10x Supine hip mobs with mobilization belt grade 4 5x 30 sec each: long axis distraction, inferior, AP in internal rotation Trigger Point Dry Needling Subsequent Treatment: Instructions provided previously at initial dry needling treatment.  Patient Verbal Consent Given: Yes Education Handout Provided: Previously Provided Muscles Treated: right gluteals in sidelying; supine iliacus; supine adductor magnus Electrical Stimulation Performed: No Treatment Response/Outcome: twitch response in adductors; decreased tender point size and number   07/13/23: NuStep L3 x 5 min  PT present to discuss status R foot on 2nd step hip ER and flexing down toward floor 2 x 30 sec Hip flexor stretch on 2nd step 2x 30 sec with arm OH (no side bend) Prone lying abolishes pain Prone press ups x 10 feels good Quadruped alt leg extension - hurts R hip and unable to keep left hip level Prone over pillow hip ext x 10 B Prone alternating opp arm/leg x 10 Quadruped hip ADDuctor stretch 2x 30 sec sitting bottom back Hip flexor strengthening in modified thomas position: painful on concentric part Plank mountain climber on table x 10 B no pain Standing hip PNF not  resistance - painful in D1 and D2 patterns  06/29/23: NuStep L3 x 5 min (L5 caused some pain with pushing)  PT present to discuss status Squat to chair x 10 Sit to stand x 5 - feels all the way down leg R foot on 2nd step hip ER and flexing down toward floor 2 x 30 sec Worked on self hip distraction using purple power cord and patient scooting bwd - feels better with hip in partial flexion and ABD versus having cord at hip height.  Manual: TPR to R psoas;  Inferior hip mobs with R knee in flexion Supine hip long axis distraction right  grade IV/V x 4 minutes Prone PA R hip mobs; also stabilized ant glide with contract/relax of piriformis; PROM in to R hip IR/ER - less pain with IR when ant glide stabilized UPA mobs B lumbar for psoas release Supine  hip flexor stretch 2 x 30 sec   06/22/23: NuStep 2' L5 PT present to discuss status Quadruped hip ADDuctor stretch x 10 B sitting bottom back Attempted eccentric hip flexor strengthening in modified Thomas position, but painful. Better with TA contraction, but still difficult. Manual: TPR to R psoas;  UPA mobs B lumbar for psoas release TPR to R QL in S/L Supine hip long axis distraction right grade  Supine lateral hip distraction with gait belt   Seated hip flexor stretch 2 x 30 sec  Lt SL: Rt clam A/ROM 2x10, Rt hip abd 2x10 A/ROM with exhale on lift and TA indraw self release with ball to R psoas in prone Prone press ups x 10   PATIENT EDUCATION:  Education details: [email or web address] Person educated: Patient Education method: Explanation, Demonstration, Verbal cues, and Handouts Education comprehension: verbalized understanding and returned demonstration   HOME EXERCISE PROGRAM: Access Code: [email or web address] URL: [email or web address] Date: 07/13/2023 Prepared by: Mliss  Exercises - Hip Flexor Stretch at Edge of Bed  - 2-3 x daily - 7 x weekly - 1 sets - 2 reps - 30 hold - Supine Piriformis Stretch with Leg Straight  - 2-3 x daily - 7 x weekly - 1 sets - 2 reps - 30 hold - Figure 4 Bridge  - 1 x daily - 7 x weekly - 2 sets - 10 reps - Squat with Chair Touch  - 1 x daily - 7 x weekly - 1 sets - 10 reps - Runner's Step Up/Down  - 1 x daily - 7 x weekly - 2 sets - 10 reps - Hip Hiking on Step  - 1 x daily - 7 x weekly - 2 sets - 10 reps - Forward T with Counter Support  - 1 x daily - 7 x weekly - 2 sets - 10 reps - Sidelying Hip Abduction  - 1 x daily - 7 x weekly - 2 sets - 10 reps - Seated Hip Flexor Stretch  - 2 x daily - 7 x weekly - 1 sets - 3 reps - 30-60 sec  hold - Psoas Mobilization with Small Ball (Mirrored)  - 1 x daily - 7 x weekly - 1 sets - 1 reps - 30-60 sec hold - Prone Press Up  - 3 x daily - 7 x weekly - 1-3 sets - 10 reps - Kneeling Adductor Stretch with Hip External Rotation  - 1 x daily - 3-4 x weekly - 1 sets - 3 reps - 30-60 sec hold - Prone Alternating Arm and Leg Lifts  - 1 x daily - 3 x  weekly - 3 sets - 10 reps - 5 sec hold  ASSESSMENT:  CLINICAL IMPRESSION: Improving pain intensity since start of care and decreased toe out positioning of right LE.  Treatment emphasis on reciprocal inhibition of hip flexors by activating gluteals.  No pain during exercise but can feel positive hip flexor lengthening.  The patient benefits significantly from dry needling and manual therapy to stimulate underlying myofascial trigger points and muscular tissue for management of neuromusculoskeletal pain and address movement impairments.  Much improved soft tissue mobility and decreased tender point size and number following treatment session.        Eval:Patient is a 68 y.o. female who was seen today for physical therapy evaluation and treatment for Rt hip pain.  She is very fit and active but has had several falls related to stepping up onto high step/ledge where the Rt hip gave out.  Hip pain started with 2nd fall 3 mos ago and she just learned yesterday with an X-ray that she has severe bone-on-bone Rt hip OA.  Pt presents with Rt hip resting in ER with signif pain and ROM restriction moving towards IR to get hip to neutral.  She has groin pain and buttock pain and it radiates into anterior thigh.  Her LE positioning is causing her adductors to act more as flexors which are being overused and have signif tightness and tenderness.  DN and stretching in recent past has helped.  Pt presents with abnormal alignment of Rt hip with signif ROM restrictions with pain related to OA.  She has painful weakness surrounding the hip.  Pain is worse with marching,  hip abd movements, climbing steeper stairs, squatting, and rolling over in bed at night.  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  Pt will benefit from skilled PT to address findings and maximize participation in daily activities.  OBJECTIVE IMPAIRMENTS: Abnormal gait, decreased activity tolerance, decreased balance, decreased knowledge of condition, decreased mobility, decreased ROM, decreased strength, hypomobility, increased muscle spasms, impaired flexibility, postural dysfunction, and pain.   ACTIVITY LIMITATIONS: carrying, lifting, bending, sitting, squatting, sleeping, stairs, transfers, and locomotion level  PARTICIPATION LIMITATIONS: cleaning, driving, community activity, occupation, and yard work  PERSONAL FACTORS: 1 comorbidity: falls related to weakness of Rt hip are also affecting patient's functional outcome.   REHAB POTENTIAL: Excellent  CLINICAL DECISION MAKING: Stable/uncomplicated  EVALUATION COMPLEXITY: Low   GOALS: Goals reviewed with patient? Yes  SHORT TERM GOALS: Target date: 07/03/23  Pt will be ind with initial HEP without exacerbation of pain Baseline: Goal status: IN PROGRESS 6/16 (fig 4  and ADDuction hurt)  2.  Pt will be able to squat and march to climb stairs in sagittal plane with minimal pain Baseline:  Goal status: PARTIALLY MET for stairs 06/29/23  3.  Pt will express understanding of movement as medicine and take frequent breaks from static positions to avoid stiffness related pain Baseline:  Goal status: MET    LONG TERM GOALS: Target date: 07/31/23  Pt will be ind with advanced HEP and understand importance of compliance upon d/c from PT Baseline:  Goal status: INITIAL  2.  Pt will improve LEFS by at least 9 points to demo improved function Baseline: 39/80, goal will be met at 48/80 Goal status: INITIAL  3.  Pt will report pain not to exceed 4/10 with getting in and  out of car Baseline: 7/10 Goal status: INITIAL  4.  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  Pt will improve Rt hip strength to allow her to step up onto 8 inch step while holding 10lb box to simulate work tasks Baseline:  Goal status: INITIAL  6.  Pt will report pain with daily activities to improve by at least 50% Baseline:  Goal status: INITIAL  PLAN:  PT FREQUENCY: 2x/week  PT DURATION: 8 weeks  PLANNED INTERVENTIONS: 97110-Therapeutic exercises, 97530- Therapeutic activity, 97112- Neuromuscular re-education, 97535- Self Care, [id number]- Manual therapy, [phone]- Gait training, [phone]- Aquatic Therapy, [phone]- Electrical stimulation (unattended), [phone]- Ionotophoresis 4mg /ml Dexamethasone , Patient/Family education, Balance training, Stair training, Taping, Dry Needling, Joint mobilization, Spinal mobilization, Cryotherapy, and Moist heat.  PLAN FOR NEXT SESSION: follow up after her trip to the beach; DN as needed; Continue with LLD and ant hip mobs, Rt psoas strength and ROM, flexibility of adductors, piriformis, gluteals, Rt hip and core strength for functional task performance as tol, Pt needs to be strong enough to step up onto taller ledges for work while carrying items   Glade Pesa, PT 07/16/23 4:14 PM Phone: [phone] Fax: [phone]

## 2023-07-20 ENCOUNTER — Ambulatory Visit: Admitting: Physical Therapy

## 2023-07-22 ENCOUNTER — Ambulatory Visit: Admitting: Physical Therapy

## 2023-07-24 NOTE — Therapy (Signed)
 OUTPATIENT PHYSICAL THERAPY LOWER EXTREMITY TREATMENT NOTE   Patient Name: Lauren [name] MRN: [id number] DOB:11-08-1955, 69 y.o., female Today's Date: 07/27/2023  END OF SESSION:  PT End of Session - 07/27/23 1152     Visit Number 8    Date for PT Re-Evaluation 07/31/23    Authorization Type [employer] - medical necessity    Progress Note Due on Visit 10    PT Start Time 1150    PT Stop Time 1230    PT Time Calculation (min) 40 min    Activity Tolerance Patient tolerated treatment well    Behavior During Therapy [facility] for tasks assessed/performed               Past Medical History:  Diagnosis Date   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   Chicken pox as a child   Depression    been on antidepressants on and off for 20 yr   GERD (gastroesophageal reflux disease)    silent with hiatal hernia   Hiatal hernia 05/22/2013   Hiatal hernia with gastroesophageal reflux 05/22/2013   Hot tub folliculitis 12/18/2013   Hyperglycemia 05/19/2016   Hyperlipidemia 05/19/2016   Measles as a child   Postmenopausal atrophic vaginitis 05/22/2013   Preventative health care 01/19/2015   Shingles 68 yrs old   Unspecified vitamin D  deficiency 05/22/2013   Past Surgical History:  Procedure Laterality Date   ABDOMINAL HYSTERECTOMY  1998   partial-still has ovaries   BILIARY STENT PLACEMENT N/A 11/23/2019   Procedure: BILIARY STENT PLACEMENT;  Surgeon: Rollin Dover, MD;  Location: [facility];  Service: Endoscopy;  Laterality: N/A;   CHOLECYSTECTOMY N/A 01/27/2023   Procedure: OPEN CHOLECYSTECTOMY, DRAINAGE OF INTRA-ABDOMINAL ABSCESS;  Surgeon: Ebbie Cough, MD;  Location: [facility];  Service: General;  Laterality: N/A;   ENDOSCOPIC RETROGRADE CHOLANGIOPANCREATOGRAPHY (ERCP) WITH PROPOFOL  N/A 06/01/2020   Procedure: ENDOSCOPIC RETROGRADE CHOLANGIOPANCREATOGRAPHY (ERCP) WITH PROPOFOL ;  Surgeon: Rollin Dover, MD;  Location: [facility];  Service: Endoscopy;  Laterality: N/A;   ERCP N/A  11/23/2019   Procedure: ENDOSCOPIC RETROGRADE CHOLANGIOPANCREATOGRAPHY (ERCP);  Surgeon: Rollin Dover, MD;  Location: [facility];  Service: Endoscopy;  Laterality: N/A;   ESOPHAGECTOMY  05-2008   INDUCED ABORTION  1979   REMOVAL OF STONES  11/23/2019   Procedure: REMOVAL OF STONES;  Surgeon: Rollin Dover, MD;  Location: [facility];  Service: Endoscopy;;   SPHINCTEROTOMY  11/23/2019   Procedure: ANNETT;  Surgeon: Rollin Dover, MD;  Location: [facility];  Service: Endoscopy;;   STENT REMOVAL  06/01/2020   Procedure: STENT REMOVAL;  Surgeon: Rollin Dover, MD;  Location: [facility];  Service: Endoscopy;;   Patient Active Problem List   Diagnosis Date Noted   Primary osteoarthritis of right hip 06/04/2023   Protein-calorie malnutrition, severe 01/29/2023   Perforated bowel (HCC) 01/26/2023   Perforated gallbladder 01/26/2023   Dysphagia 10/17/2022   Esophageal stricture 10/17/2022   History of malignant neoplasm of esophagus 10/17/2022   Leg pain 06/25/2022   Elevated liver enzymes 07/25/2021   Memory changes 10/15/2020   Ulnar neuropathy 12/27/2019   Abnormal LFTs    Choledocholithiasis with obstruction 11/22/2019   Mass of skin of left thumb 08/31/2019   Mucoid cyst, joint 08/31/2019   Osteoarthritis of finger of left hand 08/31/2019   Hyperglycemia 05/19/2016   Hyperlipidemia 05/19/2016   Hiatal hernia with gastroesophageal reflux 05/22/2013   Postmenopausal atrophic vaginitis 05/22/2013   Vitamin D  deficiency 05/22/2013   Chicken pox    History of shingles  Measles    Cancer (HCC)    Depression     PCP: Glade Horton, MD  REFERRING PROVIDER: Joane Artist RAMAN, MD  REFERRING DIAG: [phone] (ICD-10-CM) - Right leg pain M25.551 (ICD-10-CM) - Right hip pain  Rationale for Evaluation and Treatment: Rehabilitation  THERAPY DIAG:  Pain in right hip  Muscle weakness (generalized)  ONSET DATE: approx 3 mos ago - has had some falls  SUBJECTIVE:                                                                                                                                                                                            SUBJECTIVE STATEMENT: The pain is pretty good during the day, but still having pain at night. The DN really helped.     EVAL: Pt referred to PT for Rt hip pain.  Has had multiple falls which have contributed.  One fall about 9 mos ago and another 3 mos ago.  Hip gave out going up a big step and caused the fall.  Xrays done yesterday show severe OA of Rt hip.  In past has had massage, DN and stretching at [facility] which helped. She has no LBP. 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  Right now planning the [employer].  I also go to [employer] to lift weights but have taken a break from that b/c the machines were hurting my hip.    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 She had an esophagectomy in 2010. She has been in remission now for 15 years  PAIN:  PAIN:  Are you having pain? Yes NPRS scale: 0/10 Pain location: Rt knee and butt Pain orientation: Right  PAIN TYPE: aching, dull, and sharp Pain description: intermittent  Aggravating factors: hip abduction, getting out of the car, rolling in bed at night, climbing deep stairs Relieving factors: ice, stretching, hot tub, DN   PRECAUTIONS: Fall  RED FLAGS: None   WEIGHT BEARING RESTRICTIONS: No  FALLS:  Has patient fallen in last 6 months? Yes. Number of falls 1  LIVING ENVIRONMENT: Lives with: lives with their spouse Lives in: House/apartment Stairs: Yes: Internal: 13 steps; on right going up and External: 4 steps; on left going up Has following equipment at home: None  OCCUPATION: self-employed - throws outdoor festivals, part-time   PLOF: Independent  PATIENT GOALS: be lighter on my feet, less pain, get stronger  NEXT MD VISIT: approx 1 mo to see if needs an intra-articular  injection  OBJECTIVE:  Note: Objective measures were completed at Evaluation unless otherwise noted.  DIAGNOSTIC FINDINGS:  X-ray of Rt hip on 06/05/23 images right hip shows severe osteoarthritis  PATIENT SURVEYS:  LEFS: 39/80 - hardest activities are heavy activiites at home, getting in/out of car, sitting x1 hour, rolling over in bed (all rated 2)  COGNITION: Overall cognitive status: Within functional limits for tasks assessed     SENSATION: WFL  MUSCLE LENGTH:   POSTURE: scoliosis  PALPATION: Adductors tight and tender Piriformis and gluteals tight and tender  LUMBAR ROM:  All WNL and painfree  LOWER EXTREMITY ROM:     Left hip full ROM and painfree  Passive  Right eval Left eval  Hip flexion 90 deg, pain   Hip extension    Hip abduction 40 deg, pain   Hip adduction    Hip internal rotation To neutral from ER, pain   Hip external rotation Tight end range   Knee flexion WNL   Knee extension WNL   Ankle dorsiflexion    Ankle plantarflexion    Ankle inversion    Ankle eversion     (Blank rows = not tested)  LOWER EXTREMITY MMT:    MMT Right eval Left eval  Hip flexion 4- 4+  Hip extension 4 4+  Hip abduction 3+ 4  Hip adduction 4- 5  Hip internal rotation 3+ 4+  Hip external rotation 3+ 4+  Knee flexion 4 4+  Knee extension 4+ 4+  Ankle dorsiflexion    Ankle plantarflexion    Ankle inversion    Ankle eversion     (Blank rows = not tested)    FUNCTIONAL TESTS:  5 times sit to stand: 14.49 no UE assist, anterior hip and thigh pain Timed up and go (TUG): 7.42  GAIT: Distance walked:  Assistive device utilized: None Level of assistance: Complete Independence Comments: Rt hip is ER, Pt using hip adductors as flexors, shorter stride length on Rt  TREATMENT DATE:  07/24/23: NuStep L3 x 5 min  PT present to discuss status 1/2 kneel with glute squeeze 1x then lunge forward  Standing Hip thrust with heavy 50# power cord (anchored on stair  railing) 20x Supine glute bridge 10x Supine hip long axis distraction  Trigger Point Dry Needling Subsequent Treatment: Instructions provided previously at initial dry needling treatment.  Patient Verbal Consent Given: Yes Education Handout Provided: Previously Provided Muscles Treated:  right gluteals in sidelying; supine iliacus; supine adductors Electrical Stimulation Performed: No Treatment Response/Outcome: Utilized skilled palpation to identify bony landmarks and trigger points.  Able to illicit twitch response and muscle elongation.  Soft tissue mobilization to muscles needled to further promote tissue elongation and decreased pain.       07/16/23: NuStep L3 x 5 min  PT present to discuss status 1/2 kneel with glute squeeze 7x then lunge forward 10x, then overhead reach to stretch QL/ITB Standing Hip thrust with heavy 50# power cord (anchored on stair railing) 20x Supine glute bridge 10x Supine right single leg glute bridge 10x Supine hip mobs with mobilization belt grade 4 5x 30 sec each: long axis distraction, inferior, AP in internal rotation Trigger Point Dry Needling Subsequent Treatment: Instructions provided previously at initial dry needling treatment.  Patient Verbal Consent Given: Yes Education Handout Provided: Previously Provided Muscles Treated: right gluteals in sidelying; supine iliacus; supine adductor magnus Electrical Stimulation Performed: No Treatment Response/Outcome: twitch response in adductors; decreased tender point size and number   07/13/23: NuStep L3 x 5 min  PT present to discuss status R foot on 2nd step hip ER and flexing  down toward floor 2 x 30 sec Hip flexor stretch on 2nd step 2x 30 sec with arm OH (no side bend) Prone lying abolishes pain Prone press ups x 10 feels good Quadruped alt leg extension - hurts R hip and unable to keep left hip level Prone over pillow hip ext x 10 B Prone alternating opp arm/leg x 10 Quadruped hip ADDuctor  stretch 2x 30 sec sitting bottom back Hip flexor strengthening in modified thomas position: painful on concentric part Plank mountain climber on table x 10 B no pain Standing hip PNF not resistance - painful in D1 and D2 patterns  06/29/23: NuStep L3 x 5 min (L5 caused some pain with pushing)  PT present to discuss status Squat to chair x 10 Sit to stand x 5 - feels all the way down leg R foot on 2nd step hip ER and flexing down toward floor 2 x 30 sec Worked on self hip distraction using purple power cord and patient scooting bwd - feels better with hip in partial flexion and ABD versus having cord at hip height.  Manual: TPR to R psoas;  Inferior hip mobs with R knee in flexion Supine hip long axis distraction right grade IV/V x 4 minutes Prone PA R hip mobs; also stabilized ant glide with contract/relax of piriformis; PROM in to R hip IR/ER - less pain with IR when ant glide stabilized UPA mobs B lumbar for psoas release Supine  hip flexor stretch 2 x 30 sec   06/22/23: NuStep 2' L5 PT present to discuss status Quadruped hip ADDuctor stretch x 10 B sitting bottom back Attempted eccentric hip flexor strengthening in modified Thomas position, but painful. Better with TA contraction, but still difficult. Manual: TPR to R psoas;  UPA mobs B lumbar for psoas release TPR to R QL in S/L Supine hip long axis distraction right grade  Supine lateral hip distraction with gait belt   Seated hip flexor stretch 2 x 30 sec  Lt SL: Rt clam A/ROM 2x10, Rt hip abd 2x10 A/ROM with exhale on lift and TA indraw self release with ball to R psoas in prone Prone press ups x 10   PATIENT EDUCATION:  Education details: [email or web address] Person educated: Patient Education method: Explanation, Demonstration, Verbal cues, and Handouts Education comprehension: verbalized understanding and returned demonstration   HOME EXERCISE PROGRAM: Access Code: [email or web address] URL: [email or web address] Date:  07/13/2023 Prepared by: Mliss  Exercises - Hip Flexor Stretch at [facility] of Bed  - 2-3 x daily - 7 x weekly - 1 sets - 2 reps - 30 hold - Supine Piriformis Stretch with Leg Straight  - 2-3 x daily - 7 x weekly - 1 sets - 2 reps - 30 hold - Figure 4 Bridge  - 1 x daily - 7 x weekly - 2 sets - 10 reps - Squat with Chair Touch  - 1 x daily - 7 x weekly - 1 sets - 10 reps - Runner's Step Up/Down  - 1 x daily - 7 x weekly - 2 sets - 10 reps - Hip Hiking on Step  - 1 x daily - 7 x weekly - 2 sets - 10 reps - Forward T with Counter Support  - 1 x daily - 7 x weekly - 2 sets - 10 reps - Sidelying Hip Abduction  - 1 x daily - 7 x weekly - 2 sets - 10 reps - Seated Hip Flexor Stretch  - 2 x daily - 7 x weekly -  1 sets - 3 reps - 30-60 sec hold - Psoas Mobilization with Small Ball (Mirrored)  - 1 x daily - 7 x weekly - 1 sets - 1 reps - 30-60 sec hold - Prone Press Up  - 3 x daily - 7 x weekly - 1-3 sets - 10 reps - Kneeling Adductor Stretch with Hip External Rotation  - 1 x daily - 3-4 x weekly - 1 sets - 3 reps - 30-60 sec hold - Prone Alternating Arm and Leg Lifts  - 1 x daily - 3 x weekly - 3 sets - 10 reps - 5 sec hold  ASSESSMENT:  CLINICAL IMPRESSION: Patient reporting relief after last session. Her pain is mainly at night now. She is able to modify tasks during the day to avoid pain. Good response to DN again today primarily in gluteals and ADDuctors. Patient travelling until next week. Needs re-cert next visit.      Eval:Patient is a 68 y.o. female who was seen today for physical therapy evaluation and treatment for Rt hip pain.  She is very fit and active but has had several falls related to stepping up onto high step/ledge where the Rt hip gave out.  Hip pain started with 2nd fall 3 mos ago and she just learned yesterday with an X-ray that she has severe bone-on-bone Rt hip OA.  Pt presents with Rt hip resting in ER with signif pain and ROM restriction moving towards IR to get hip to neutral.   She has groin pain and buttock pain and it radiates into anterior thigh.  Her LE positioning is causing her adductors to act more as flexors which are being overused and have signif tightness and tenderness.  DN and stretching in recent past has helped.  Pt presents with abnormal alignment of Rt hip with signif ROM restrictions with pain related to OA.  She has painful weakness surrounding the hip.  Pain is worse with marching, hip abd movements, climbing steeper stairs, squatting, and rolling over in bed at night.  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  Pt will benefit from skilled PT to address findings and maximize participation in daily activities.  OBJECTIVE IMPAIRMENTS: Abnormal gait, decreased activity tolerance, decreased balance, decreased knowledge of condition, decreased mobility, decreased ROM, decreased strength, hypomobility, increased muscle spasms, impaired flexibility, postural dysfunction, and pain.   ACTIVITY LIMITATIONS: carrying, lifting, bending, sitting, squatting, sleeping, stairs, transfers, and locomotion level  PARTICIPATION LIMITATIONS: cleaning, driving, community activity, occupation, and yard work  PERSONAL FACTORS: 1 comorbidity: falls related to weakness of Rt hip are also affecting patient's functional outcome.   REHAB POTENTIAL: Excellent  CLINICAL DECISION MAKING: Stable/uncomplicated  EVALUATION COMPLEXITY: Low   GOALS: Goals reviewed with patient? Yes  SHORT TERM GOALS: Target date: 07/03/23  Pt will be ind with initial HEP without exacerbation of pain Baseline: Goal status: IN PROGRESS 6/16 (fig 4  and ADDuction hurt)  2.  Pt will be able to squat and march to climb stairs in sagittal plane with minimal pain Baseline:  Goal status: PARTIALLY MET for stairs 06/29/23  3.  Pt will express understanding of movement as medicine and take frequent breaks from static positions to  avoid stiffness related pain Baseline:  Goal status: MET    LONG TERM GOALS: Target date: 07/31/23  Pt will be ind with advanced HEP and understand importance of compliance upon d/c from PT Baseline:  Goal status: INITIAL  2.  Pt will improve LEFS by at least 9 points to demo improved function Baseline: 39/80, goal will be met at 48/80 Goal status: INITIAL  3.  Pt will report pain not to exceed 4/10 with getting in and out of car Baseline: 7/10 Goal status: INITIAL  4.  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  Pt will improve Rt hip strength to allow her to step up onto 8 inch step while holding 10lb box to simulate work tasks Baseline:  Goal status: INITIAL  6.  Pt will report pain with daily activities to improve by at least 50% Baseline:  Goal status: INITIAL  PLAN:  PT FREQUENCY: 2x/week  PT DURATION: 8 weeks  PLANNED INTERVENTIONS: 97110-Therapeutic exercises, 97530- Therapeutic activity, 97112- Neuromuscular re-education, 97535- Self Care, [id number]- Manual therapy, [phone]- Gait training, [phone]- Aquatic Therapy, [phone]- Electrical stimulation (unattended), [phone]- Ionotophoresis 4mg /ml Dexamethasone , Patient/Family education, Balance training, Stair training, Taping, Dry Needling, Joint mobilization, Spinal mobilization, Cryotherapy, and Moist heat.  PLAN FOR NEXT SESSION: Re-cert or D/C, check goals, follow up after her trip to the [place] ;  DN as needed; Continue with LLD and ant hip mobs, Rt psoas strength and ROM, flexibility of adductors, piriformis, gluteals, Rt hip and core strength for functional task performance as tol, Pt needs to be strong enough to step up onto taller ledges for work while carrying [employer], PT  07/27/23 1:48 PM Phone: [phone] Fax: [phone]

## 2023-07-27 ENCOUNTER — Ambulatory Visit: Admitting: Physical Therapy

## 2023-07-27 ENCOUNTER — Encounter: Payer: Self-pay | Admitting: Physical Therapy

## 2023-07-27 DIAGNOSIS — M25551 Pain in right hip: Secondary | ICD-10-CM | POA: Diagnosis not present

## 2023-07-27 DIAGNOSIS — M6281 Muscle weakness (generalized): Secondary | ICD-10-CM

## 2023-07-29 ENCOUNTER — Encounter: Admitting: Physical Therapy

## 2023-08-03 ENCOUNTER — Telehealth: Payer: Self-pay | Admitting: Family Medicine

## 2023-08-03 NOTE — Telephone Encounter (Addendum)
 Patient called and would like to schedule an MRI for her hip. She would like to get an appointment in the next two weeks. She is also asking if she can get a cortisone shot before the first of August. She is scheduled for next week. Please confirm this is okay.

## 2023-08-04 ENCOUNTER — Ambulatory Visit: Admitting: Physical Therapy

## 2023-08-04 DIAGNOSIS — M25551 Pain in right hip: Secondary | ICD-10-CM

## 2023-08-04 DIAGNOSIS — M6281 Muscle weakness (generalized): Secondary | ICD-10-CM

## 2023-08-04 NOTE — Telephone Encounter (Signed)
 She is scheduled to see me on the 30th.  Will make the determination on what kind of imaging is necessary during that visit.

## 2023-08-04 NOTE — Therapy (Signed)
 OUTPATIENT PHYSICAL THERAPY LOWER EXTREMITY TREATMENT NOTE/RECERTIFICATION    Patient Name: Lauren [name] MRN: [id number] DOB:03-30-1955, 68 y.o., female Today's Date: 08/04/2023  END OF SESSION:  PT End of Session - 08/04/23 1152     Visit Number 9    Date for PT Re-Evaluation 09/29/23    Authorization Type [employer] - medical necessity    Progress Note Due on Visit 10    PT Start Time 1150    PT Stop Time 1230    PT Time Calculation (min) 40 min    Activity Tolerance Patient tolerated treatment well               Past Medical History:  Diagnosis Date   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   Chicken pox as a child   Depression    been on antidepressants on and off for 20 yr   GERD (gastroesophageal reflux disease)    silent with hiatal hernia   Hiatal hernia 05/22/2013   Hiatal hernia with gastroesophageal reflux 05/22/2013   Hot tub folliculitis 12/18/2013   Hyperglycemia 05/19/2016   Hyperlipidemia 05/19/2016   Measles as a child   Postmenopausal atrophic vaginitis 05/22/2013   Preventative health care 01/19/2015   Shingles 68 yrs old   Unspecified vitamin D  deficiency 05/22/2013   Past Surgical History:  Procedure Laterality Date   ABDOMINAL HYSTERECTOMY  1998   partial-still has ovaries   BILIARY STENT PLACEMENT N/A 11/23/2019   Procedure: BILIARY STENT PLACEMENT;  Surgeon: Rollin Dover, MD;  Location: [facility];  Service: Endoscopy;  Laterality: N/A;   CHOLECYSTECTOMY N/A 01/27/2023   Procedure: OPEN CHOLECYSTECTOMY, DRAINAGE OF INTRA-ABDOMINAL ABSCESS;  Surgeon: Ebbie Cough, MD;  Location: [facility];  Service: General;  Laterality: N/A;   ENDOSCOPIC RETROGRADE CHOLANGIOPANCREATOGRAPHY (ERCP) WITH PROPOFOL  N/A 06/01/2020   Procedure: ENDOSCOPIC RETROGRADE CHOLANGIOPANCREATOGRAPHY (ERCP) WITH PROPOFOL ;  Surgeon: Rollin Dover, MD;  Location: [facility];  Service: Endoscopy;  Laterality: N/A;   ERCP N/A 11/23/2019   Procedure: ENDOSCOPIC RETROGRADE  CHOLANGIOPANCREATOGRAPHY (ERCP);  Surgeon: Rollin Dover, MD;  Location: [facility];  Service: Endoscopy;  Laterality: N/A;   ESOPHAGECTOMY  05-2008   INDUCED ABORTION  1979   REMOVAL OF STONES  11/23/2019   Procedure: REMOVAL OF STONES;  Surgeon: Rollin Dover, MD;  Location: [facility];  Service: Endoscopy;;   SPHINCTEROTOMY  11/23/2019   Procedure: ANNETT;  Surgeon: Rollin Dover, MD;  Location: [facility];  Service: Endoscopy;;   STENT REMOVAL  06/01/2020   Procedure: STENT REMOVAL;  Surgeon: Rollin Dover, MD;  Location: [facility];  Service: Endoscopy;;   Patient Active Problem List   Diagnosis Date Noted   Primary osteoarthritis of right hip 06/04/2023   Protein-calorie malnutrition, severe 01/29/2023   Perforated bowel (HCC) 01/26/2023   Perforated gallbladder 01/26/2023   Dysphagia 10/17/2022   Esophageal stricture 10/17/2022   History of malignant neoplasm of esophagus 10/17/2022   Leg pain 06/25/2022   Elevated liver enzymes 07/25/2021   Memory changes 10/15/2020   Ulnar neuropathy 12/27/2019   Abnormal LFTs    Choledocholithiasis with obstruction 11/22/2019   Mass of skin of left thumb 08/31/2019   Mucoid cyst, joint 08/31/2019   Osteoarthritis of finger of left hand 08/31/2019   Hyperglycemia 05/19/2016   Hyperlipidemia 05/19/2016   Hiatal hernia with gastroesophageal reflux 05/22/2013   Postmenopausal atrophic vaginitis 05/22/2013   Vitamin D  deficiency 05/22/2013   Chicken pox    History of shingles    Measles    Cancer (HCC)  Depression     PCP: Glade Horton, MD  REFERRING PROVIDER: Joane Artist RAMAN, MD  REFERRING DIAG: [phone] (ICD-10-CM) - Right leg pain M25.551 (ICD-10-CM) - Right hip pain  Rationale for Evaluation and Treatment: Rehabilitation  THERAPY DIAG:  Pain in right hip  Muscle weakness (generalized)  ONSET DATE: approx 3 mos ago - has had some falls  SUBJECTIVE:                                                                                                                                                                                            SUBJECTIVE STATEMENT: My hip was hurting while I was walking in [place] .  I've decided to do cortisone shot next Wednesday.  I think I need an MRI.  I wonder if I need surgery.  Less relief from PT compared to initial visits.     EVAL: Pt referred to PT for Rt hip pain.  Has had multiple falls which have contributed.  One fall about 9 mos ago and another 3 mos ago.  Hip gave out going up a big step and caused the fall.  Xrays done yesterday show severe OA of Rt hip.  In past has had massage, DN and stretching at [facility] which helped. She has no LBP. 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  Right now planning the [employer].  I also go to [employer] to lift weights but have taken a break from that b/c the machines were hurting my hip.    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 She had an esophagectomy in 2010. She has been in remission now for 15 years  PAIN:  PAIN:  Are you having pain? Yes NPRS scale: 0/10 Pain location: Rt knee and butt Pain orientation: Right  PAIN TYPE: aching, dull, and sharp Pain description: intermittent  Aggravating factors: hip abduction, getting out of the car, rolling in bed at night, climbing deep stairs Relieving factors: ice, stretching, hot tub, DN   PRECAUTIONS: Fall  RED FLAGS: None   WEIGHT BEARING RESTRICTIONS: No  FALLS:  Has patient fallen in last 6 months? Yes. Number of falls 1  LIVING ENVIRONMENT: Lives with: lives with their spouse Lives in: House/apartment Stairs: Yes: Internal: 13 steps; on right going up and External: 4 steps; on left going up Has following equipment at home: None  OCCUPATION: self-employed - throws outdoor festivals, part-time   PLOF: Independent  PATIENT GOALS: be lighter on my feet, less pain, get stronger  NEXT MD  VISIT: approx 1 mo to see if needs an intra-articular injection  OBJECTIVE:  Note: Objective measures were completed at Evaluation unless otherwise noted.  DIAGNOSTIC FINDINGS:  X-ray of Rt hip on 06/05/23 images right hip shows severe osteoarthritis  PATIENT SURVEYS:  LEFS: 39/80 - hardest activities are heavy activiites at home, getting in/out of car, sitting x1 hour, rolling over in bed (all rated 2) 7/22:  LEFS 44/80  COGNITION: Overall cognitive status: Within functional limits for tasks assessed     SENSATION: WFL  MUSCLE LENGTH:   POSTURE: scoliosis  PALPATION: Adductors tight and tender Piriformis and gluteals tight and tender  LUMBAR ROM:  All WNL and painfree  LOWER EXTREMITY ROM:     Left hip full ROM and painfree  Passive  Right eval Left eval 7/22  Hip flexion 90 deg, pain  115 but with increased external rotation to avoid the pain  Hip extension     Hip abduction 40 deg, pain  45  Hip adduction     Hip internal rotation To neutral from ER, pain  10  Hip external rotation Tight end range  Tightness at end range   Knee flexion WNL    Knee extension WNL    Ankle dorsiflexion     Ankle plantarflexion     Ankle inversion     Ankle eversion      (Blank rows = not tested)  LOWER EXTREMITY MMT:    MMT Right eval Left eval 7/22  Hip flexion 4- 4+ R 4  Hip extension 4 4+ R 4+  Hip abduction 3+ 4 R 4-  Hip adduction 4- 5   Hip internal rotation 3+ 4+ R 4-  Hip external rotation 3+ 4+ R 4-  Knee flexion 4 4+   Knee extension 4+ 4+   Ankle dorsiflexion     Ankle plantarflexion     Ankle inversion     Ankle eversion      (Blank rows = not tested)    FUNCTIONAL TESTS:  5 times sit to stand: 14.49 no UE assist, anterior hip and thigh pain Timed up and go (TUG): 7.42  7/22: 5X STS 9.62  GAIT: Distance walked:  Assistive device utilized: None Level of assistance: Complete Independence Comments: Rt hip is ER, Pt using hip adductors as  flexors, shorter stride length on Rt  TREATMENT DATE:  08/04/23: Self care: discussed results of LEFS and 5x STS; pt has questions regarding upcoming injection, surgical approaches, whether she needs imaging; benefit of glute strengthening to unload the joint Standing Hip thrust with heavy 50# power cord (anchored on stair railing) 20x Leg swing (dangle) off edge of step (add weight next time) Supine right hip mobilization: grade 3/4:  long axis distraction with belt, inferior mobs with belt, AP in internal rotation, lateral mobs with belt   07/24/23: NuStep L3 x 5 min  PT present to discuss status 1/2 kneel with glute squeeze 1x then lunge forward  Standing Hip thrust with heavy 50# power cord (anchored on stair railing) 20x Supine glute bridge 10x Supine hip long axis distraction  Trigger Point Dry Needling Subsequent Treatment: Instructions provided previously at initial dry needling treatment.  Patient Verbal Consent Given: Yes Education Handout Provided: Previously Provided Muscles Treated:  right gluteals in sidelying; supine iliacus; supine adductors Electrical Stimulation Performed: No Treatment Response/Outcome: Utilized skilled palpation to identify bony landmarks and trigger points.  Able to illicit twitch response and muscle elongation.  Soft tissue mobilization to muscles needled to further promote tissue elongation and decreased pain.  07/16/23: NuStep L3 x 5 min  PT present to discuss status 1/2 kneel with glute squeeze 7x then lunge forward 10x, then overhead reach to stretch QL/ITB Standing Hip thrust with heavy 50# power cord (anchored on stair railing) 20x Supine glute bridge 10x Supine right single leg glute bridge 10x Supine hip mobs with mobilization belt grade 4 5x 30 sec each: long axis distraction, inferior, AP in internal rotation Trigger Point Dry Needling Subsequent Treatment: Instructions provided previously at initial dry needling treatment.  Patient  Verbal Consent Given: Yes Education Handout Provided: Previously Provided Muscles Treated: right gluteals in sidelying; supine iliacus; supine adductor magnus Electrical Stimulation Performed: No Treatment Response/Outcome: twitch response in adductors; decreased tender point size and number  PATIENT EDUCATION:  Education details: [email or web address] Person educated: Patient Education method: Explanation, Demonstration, Verbal cues, and Handouts Education comprehension: verbalized understanding and returned demonstration   HOME EXERCISE PROGRAM: Access Code: [email or web address] URL: [email or web address] Date: 07/13/2023 Prepared by: Mliss  Exercises - Hip Flexor Stretch at Edge of Bed  - 2-3 x daily - 7 x weekly - 1 sets - 2 reps - 30 hold - Supine Piriformis Stretch with Leg Straight  - 2-3 x daily - 7 x weekly - 1 sets - 2 reps - 30 hold - Figure 4 Bridge  - 1 x daily - 7 x weekly - 2 sets - 10 reps - Squat with Chair Touch  - 1 x daily - 7 x weekly - 1 sets - 10 reps - Runner's Step Up/Down  - 1 x daily - 7 x weekly - 2 sets - 10 reps - Hip Hiking on Step  - 1 x daily - 7 x weekly - 2 sets - 10 reps - Forward T with Counter Support  - 1 x daily - 7 x weekly - 2 sets - 10 reps - Sidelying Hip Abduction  - 1 x daily - 7 x weekly - 2 sets - 10 reps - Seated Hip Flexor Stretch  - 2 x daily - 7 x weekly - 1 sets - 3 reps - 30-60 sec hold - Psoas Mobilization with Small Ball (Mirrored)  - 1 x daily - 7 x weekly - 1 sets - 1 reps - 30-60 sec hold - Prone Press Up  - 3 x daily - 7 x weekly - 1-3 sets - 10 reps - Kneeling Adductor Stretch with Hip External Rotation  - 1 x daily - 3-4 x weekly - 1 sets - 3 reps - 30-60 sec hold - Prone Alternating Arm and Leg Lifts  - 1 x daily - 3 x weekly - 3 sets - 10 reps - 5 sec hold  ASSESSMENT:  CLINICAL IMPRESSION: Increased hip pain following recent travel to [place] .  She has made improvements in movement speed with 5x STS test and strength in right LE  since start of care.  Her LEFS functional outcome measure has also improved.  She reports relief with hip joint manual therapy and DN.  The patient would benefit from a continuation of skilled PT for a further progression of strengthening particularly gluteals and functional mobility.  Will continue to update and promote independence in a HEP needed for a return to the highest functional level possible with ADLs.        Eval:Patient is a 68 y.o. female who was seen today for physical therapy evaluation and treatment for Rt hip pain.  She is very fit and active but has had several falls related to stepping  up onto high step/ledge where the Rt hip gave out.  Hip pain started with 2nd fall 3 mos ago and she just learned yesterday with an X-ray that she has severe bone-on-bone Rt hip OA.  Pt presents with Rt hip resting in ER with signif pain and ROM restriction moving towards IR to get hip to neutral.  She has groin pain and buttock pain and it radiates into anterior thigh.  Her LE positioning is causing her adductors to act more as flexors which are being overused and have signif tightness and tenderness.  DN and stretching in recent past has helped.  Pt presents with abnormal alignment of Rt hip with signif ROM restrictions with pain related to OA.  She has painful weakness surrounding the hip.  Pain is worse with marching, hip abd movements, climbing steeper stairs, squatting, and rolling over in bed at night.  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  Pt will benefit from skilled PT to address findings and maximize participation in daily activities.  OBJECTIVE IMPAIRMENTS: Abnormal gait, decreased activity tolerance, decreased balance, decreased knowledge of condition, decreased mobility, decreased ROM, decreased strength, hypomobility, increased muscle spasms, impaired flexibility, postural dysfunction, and pain.   ACTIVITY  LIMITATIONS: carrying, lifting, bending, sitting, squatting, sleeping, stairs, transfers, and locomotion level  PARTICIPATION LIMITATIONS: cleaning, driving, community activity, occupation, and yard work  PERSONAL FACTORS: 1 comorbidity: falls related to weakness of Rt hip are also affecting patient's functional outcome.   REHAB POTENTIAL: Excellent  CLINICAL DECISION MAKING: Stable/uncomplicated  EVALUATION COMPLEXITY: Low   GOALS: Goals reviewed with patient? Yes  SHORT TERM GOALS: Target date: 07/03/23  Pt will be ind with initial HEP without exacerbation of pain Baseline: Goal status: IN PROGRESS 6/16 (fig 4  and ADDuction hurt)  2.  Pt will be able to squat and march to climb stairs in sagittal plane with minimal pain Baseline:  Goal status: PARTIALLY MET for stairs 06/29/23  3.  Pt will express understanding of movement as medicine and take frequent breaks from static positions to avoid stiffness related pain Baseline:  Goal status: MET    LONG TERM GOALS: Target date: 07/31/23  Pt will be ind with advanced HEP and understand importance of compliance upon d/c from PT Baseline:  Goal status: INITIAL  2.  Pt will improve LEFS by at least 9 points to demo improved function Baseline: 39/80, goal will be met at 48/80 Goal status: INITIAL  3.  Pt will report pain not to exceed 4/10 with getting in and out of car Baseline: 7/10 Goal status: INITIAL  4.  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  Pt will improve Rt hip strength to allow her to step up onto 8 inch step while holding 10lb box to simulate work tasks Baseline:  Goal status: INITIAL  6.  Pt will report pain with daily activities to improve by at least 50% Baseline:  Goal status: INITIAL  PLAN:  PT FREQUENCY: 2x/week  PT DURATION: 8 weeks  PLANNED INTERVENTIONS: 97110-Therapeutic  exercises, 97530- Therapeutic activity, 97112- Neuromuscular re-education, 97535- Self Care, 02859- Manual therapy, [phone]- Gait training, [phone]- Aquatic Therapy, [phone]- Electrical stimulation (unattended), [phone]- Ionotophoresis 4mg /ml Dexamethasone , Patient/Family education, Balance training, Stair training, Taping, Dry Needling, Joint mobilization, Spinal mobilization, Cryotherapy, and Moist heat.  PLAN FOR NEXT SESSION: 10th visit  progress note;  getting injection next week; add weight to leg swings/dangles; try power cord with squats and quad rocks; DN as needed; Continue with LLD and ant hip mobs, Rt psoas strength and ROM, flexibility of adductors, piriformis, gluteals, Rt hip and core strength for functional task performance as tol, Pt needs to be strong enough to step up onto taller ledges for work while carrying items  Glade Pesa, PT 08/04/23 5:42 PM Phone: [phone] Fax: [phone]

## 2023-08-06 ENCOUNTER — Ambulatory Visit: Admitting: Physical Therapy

## 2023-08-06 ENCOUNTER — Telehealth: Payer: Self-pay | Admitting: Physical Therapy

## 2023-08-06 NOTE — Telephone Encounter (Signed)
 Called pt regarding no-show for appt today.  Left message on voicemail for pt to call the facility as she has no more appts scheduled.

## 2023-08-07 ENCOUNTER — Other Ambulatory Visit: Payer: Self-pay | Admitting: Family Medicine

## 2023-08-12 ENCOUNTER — Ambulatory Visit: Admitting: Family Medicine

## 2023-08-12 ENCOUNTER — Other Ambulatory Visit: Payer: Self-pay

## 2023-08-12 ENCOUNTER — Encounter: Payer: Self-pay | Admitting: Family Medicine

## 2023-08-12 VITALS — BP 116/76 | HR 85 | Ht 64.0 in | Wt 122.0 lb

## 2023-08-12 DIAGNOSIS — M79604 Pain in right leg: Secondary | ICD-10-CM | POA: Diagnosis not present

## 2023-08-12 DIAGNOSIS — M1611 Unilateral primary osteoarthritis, right hip: Secondary | ICD-10-CM

## 2023-08-12 DIAGNOSIS — M25551 Pain in right hip: Secondary | ICD-10-CM | POA: Diagnosis not present

## 2023-08-12 NOTE — Patient Instructions (Addendum)
 Thank you for coming in today.   You received an injection today. Seek immediate medical attention if the joint becomes red, extremely painful, or is oozing fluid.   Referral placed to Ortho Surg  See you back as needed.

## 2023-08-12 NOTE — Progress Notes (Unsigned)
 I, Leotis Batter, CMA acting as a scribe for Artist Lloyd, MD.  Lauren [name] is a 68 y.o. female who presents to [facility] today for f/u R hip pain. Pt was last seen by Dr. Lloyd on 07/08/23 and was advised to cont HEP and PT, completing 9 visits.  Today, pt reports worsening hip pain over the past 2 weeks. Was having improvement initially with PT and HEP. Compliant with HEP but sometimes this exacerbates sx. Taking IBU or Tramadol prn. Has also tried ice, heat, elevation, medication. Locates pain to anterior aspect radiating into the groin.   Dx imaging: 06/04/23 R hip XR   Pertinent review of systems: No fevers or chills  Relevant historical information: History of esophageal cancer in 2010 treated with surgical excision.  Exam:  BP 116/76   Pulse 85   Ht 5' 4 (1.626 m)   Wt 122 lb (55.3 kg)   SpO2 97%   BMI 20.94 kg/m  General: Well Developed, well nourished, and in no acute distress.   MSK: Right hip decreased range of motion.  Normal gait    Lab and Radiology Results  Procedure: Real-time Ultrasound Guided Injection of right hip femoral acetabular joint anterior approach Device: Philips Affiniti 50G/GE Logiq Images permanently stored and available for review in PACS Verbal informed consent obtained.  Discussed risks and benefits of procedure. Warned about infection, bleeding, hyperglycemia damage to structures among others. Patient expresses understanding and agreement Time-out conducted.   Noted no overlying erythema, induration, or other signs of local infection.   Skin prepped in a sterile fashion.   Local anesthesia: Topical Ethyl chloride.   With sterile technique and under real time ultrasound guidance: 40 mg of Kenalog  and 2 mL of Marcaine  injected into hip joint. Fluid seen entering the joint capsule.   Completed without difficulty   Pain immediately resolved suggesting accurate placement of the medication.   Advised to  call if fevers/chills, erythema, induration, drainage, or persistent bleeding.   Images permanently stored and available for review in the ultrasound unit.  Impression: Technically successful ultrasound guided injection.        Assessment and Plan: 68 y.o. female with right anterior hip pain due to DJD.  Patient has severe DJD on x-ray at the last visit.  Plan for steroid injection today.  Plan to refer to orthopedic surgery to consider total hip replacement as early as November.  She does have a history of esophageal cancer in 2010.  She has been in remission for the last 15 years and is in effectively great health right now.  I think she is going to do fine with the surgery in the future.  Previous back pain has resolved with physical therapy.  She is doing quite well. PDMP not reviewed this encounter. Orders Placed This Encounter  Procedures   US  LIMITED JOINT SPACE STRUCTURES LOW RIGHT(NO LINKED CHARGES)    Reason for Exam (SYMPTOM  OR DIAGNOSIS REQUIRED):   right elbow pain    Preferred imaging location?:   [facility] referral to Orthopedic Surgery    Referral Priority:   Routine    Referral Type:   Surgical    Referral Reason:   Specialty Services Required    Referred to Provider:   Vernetta Lonni GRADE, MD    Requested Specialty:   Orthopedic Surgery    Number of Visits Requested:   1   No orders of the defined types  were placed in this encounter.    Discussed warning signs or symptoms. Please see discharge instructions. Patient expresses understanding.   The above documentation has been reviewed and is accurate and complete Artist Lloyd, M.D.

## 2023-08-13 ENCOUNTER — Telehealth: Payer: Self-pay | Admitting: Family Medicine

## 2023-08-13 MED ORDER — MELOXICAM 7.5 MG PO TABS: 7.5000 mg | ORAL_TABLET | Freq: Every day | ORAL | 0 refills | Status: DC | PRN

## 2023-08-13 NOTE — Telephone Encounter (Signed)
 Patient called and asked if an anti inflammatory could be sent to her pharmacy. She is leaving this weekend to go out of town and the cortisone shot has not kicked in yet. Please advise

## 2023-08-13 NOTE — Telephone Encounter (Signed)
 Meloxicam  prescribed.  Do not take with ibuprofen  or Aleve.  Okay to take with Tylenol .

## 2023-08-23 ENCOUNTER — Encounter: Payer: Self-pay | Admitting: Family Medicine

## 2023-08-24 ENCOUNTER — Other Ambulatory Visit: Payer: Self-pay | Admitting: Family Medicine

## 2023-08-24 DIAGNOSIS — M1611 Unilateral primary osteoarthritis, right hip: Secondary | ICD-10-CM

## 2023-08-24 DIAGNOSIS — M25551 Pain in right hip: Secondary | ICD-10-CM

## 2023-08-26 ENCOUNTER — Telehealth: Payer: Self-pay | Admitting: Family Medicine

## 2023-08-26 DIAGNOSIS — M25551 Pain in right hip: Secondary | ICD-10-CM

## 2023-08-26 DIAGNOSIS — M79604 Pain in right leg: Secondary | ICD-10-CM

## 2023-08-26 NOTE — Telephone Encounter (Signed)
 Copied from CRM [id number]. Topic: Appointments - Scheduling Inquiry for Clinic >> Aug 26, 2023  3:24 PM Henretta I wrote: Reason for CRM: Patient is wanting to get her physical therapy started back up Please give patient a call to help schedule.

## 2023-08-27 ENCOUNTER — Other Ambulatory Visit: Payer: Self-pay | Admitting: Family Medicine

## 2023-08-27 MED ORDER — VENLAFAXINE HCL ER 75 MG PO CP24: ORAL_CAPSULE | ORAL | 1 refills | Status: DC

## 2023-09-01 ENCOUNTER — Ambulatory Visit

## 2023-09-01 ENCOUNTER — Encounter: Payer: Self-pay | Admitting: Rehabilitative and Restorative Service Providers"

## 2023-09-01 ENCOUNTER — Ambulatory Visit: Attending: Family Medicine | Admitting: Rehabilitative and Restorative Service Providers"

## 2023-09-01 ENCOUNTER — Ambulatory Visit: Admitting: Rehabilitative and Restorative Service Providers"

## 2023-09-01 DIAGNOSIS — M25551 Pain in right hip: Secondary | ICD-10-CM | POA: Insufficient documentation

## 2023-09-01 DIAGNOSIS — M6281 Muscle weakness (generalized): Secondary | ICD-10-CM | POA: Insufficient documentation

## 2023-09-01 NOTE — Therapy (Signed)
 OUTPATIENT PHYSICAL THERAPY TREATMENT  NOTE   Patient Name: Lauren [name] MRN: [id number] DOB:Feb 25, 1955, 68 y.o., female Today's Date: 09/01/2023  Progress Note Reporting Period 06/05/2023 to 09/01/2023  See note below for Objective Data and Assessment of Progress/Goals.      END OF SESSION:  PT End of Session - 09/01/23 1058     Visit Number 10    Date for PT Re-Evaluation 09/29/23    Authorization Type [employer] - medical necessity    Progress Note Due on Visit 20    PT Start Time 1015    PT Stop Time 1058    PT Time Calculation (min) 43 min    Activity Tolerance Patient tolerated treatment well    Behavior During Therapy [facility] for tasks assessed/performed                Past Medical History:  Diagnosis Date   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   Chicken pox as a child   Depression    been on antidepressants on and off for 20 yr   GERD (gastroesophageal reflux disease)    silent with hiatal hernia   Hiatal hernia 05/22/2013   Hiatal hernia with gastroesophageal reflux 05/22/2013   Hot tub folliculitis 12/18/2013   Hyperglycemia 05/19/2016   Hyperlipidemia 05/19/2016   Measles as a child   Postmenopausal atrophic vaginitis 05/22/2013   Preventative health care 01/19/2015   Shingles 68 yrs old   Unspecified vitamin D  deficiency 05/22/2013   Past Surgical History:  Procedure Laterality Date   ABDOMINAL HYSTERECTOMY  1998   partial-still has ovaries   BILIARY STENT PLACEMENT N/A 11/23/2019   Procedure: BILIARY STENT PLACEMENT;  Surgeon: Rollin Dover, MD;  Location: [facility];  Service: Endoscopy;  Laterality: N/A;   CHOLECYSTECTOMY N/A 01/27/2023   Procedure: OPEN CHOLECYSTECTOMY, DRAINAGE OF INTRA-ABDOMINAL ABSCESS;  Surgeon: Ebbie Cough, MD;  Location: [facility];  Service: General;  Laterality: N/A;   ENDOSCOPIC RETROGRADE CHOLANGIOPANCREATOGRAPHY (ERCP) WITH PROPOFOL  N/A 06/01/2020   Procedure: ENDOSCOPIC RETROGRADE CHOLANGIOPANCREATOGRAPHY (ERCP)  WITH PROPOFOL ;  Surgeon: Rollin Dover, MD;  Location: [facility];  Service: Endoscopy;  Laterality: N/A;   ERCP N/A 11/23/2019   Procedure: ENDOSCOPIC RETROGRADE CHOLANGIOPANCREATOGRAPHY (ERCP);  Surgeon: Rollin Dover, MD;  Location: [facility];  Service: Endoscopy;  Laterality: N/A;   ESOPHAGECTOMY  05-2008   INDUCED ABORTION  1979   REMOVAL OF STONES  11/23/2019   Procedure: REMOVAL OF STONES;  Surgeon: Rollin Dover, MD;  Location: [facility];  Service: Endoscopy;;   SPHINCTEROTOMY  11/23/2019   Procedure: ANNETT;  Surgeon: Rollin Dover, MD;  Location: [facility];  Service: Endoscopy;;   STENT REMOVAL  06/01/2020   Procedure: STENT REMOVAL;  Surgeon: Rollin Dover, MD;  Location: [facility];  Service: Endoscopy;;   Patient Active Problem List   Diagnosis Date Noted   Primary osteoarthritis of right hip 06/04/2023   Protein-calorie malnutrition, severe 01/29/2023   Perforated bowel (HCC) 01/26/2023   Perforated gallbladder 01/26/2023   Dysphagia 10/17/2022   Esophageal stricture 10/17/2022   History of malignant neoplasm of esophagus 10/17/2022   Leg pain 06/25/2022   Elevated liver enzymes 07/25/2021   Memory changes 10/15/2020   Ulnar neuropathy 12/27/2019   Abnormal LFTs    Choledocholithiasis with obstruction 11/22/2019   Mass of skin of left thumb 08/31/2019   Mucoid cyst, joint 08/31/2019   Osteoarthritis of finger of left hand 08/31/2019   Hyperglycemia 05/19/2016   Hyperlipidemia 05/19/2016   Hiatal hernia with gastroesophageal reflux  05/22/2013   Postmenopausal atrophic vaginitis 05/22/2013   Vitamin D  deficiency 05/22/2013   Chicken pox    History of shingles    Measles    Cancer (HCC)    Depression     PCP: Glade Horton, MD  REFERRING PROVIDER: Joane Artist RAMAN, MD  REFERRING DIAG: [phone] (ICD-10-CM) - Right leg pain M25.551 (ICD-10-CM) - Right hip pain  Rationale for Evaluation and Treatment: Rehabilitation  THERAPY DIAG:  Pain in right  hip  Muscle weakness (generalized)  ONSET DATE: approx 3 mos ago - has had some falls  SUBJECTIVE:                                                                                                                                                                                           SUBJECTIVE STATEMENT:  Pt reports 4/10 Right hip, radiating down her quads to the knee, however she has not had that radiating pain since getting the cortizone shot recently. Pt states she went to the beach after the shot and pain was much worse.  Pt presents with order from Dr Carleton to resume PT.     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 She had an esophagectomy in 2010. She has been in remission now for 15 years  PAIN:  PAIN:  Are you having pain? Yes NPRS scale: 4/10 Pain location: Rt knee and butt Pain orientation: Right  PAIN TYPE: aching, dull, and sharp Pain description: intermittent  Aggravating factors: hip abduction, getting out of the car, rolling in bed at night, climbing deep stairs Relieving factors: ice, stretching, hot tub, DN   PRECAUTIONS: Fall  RED FLAGS: None   WEIGHT BEARING RESTRICTIONS: No  FALLS:  Has patient fallen in last 6 months? Yes. Number of falls 1  LIVING ENVIRONMENT: Lives with: lives with their spouse Lives in: House/apartment Stairs: Yes: Internal: 13 steps; on right going up and External: 4 steps; on left going up Has following equipment at home: None  OCCUPATION: self-employed - throws outdoor festivals, part-time   PLOF: Independent and Leisure: I throw outdoor festivals which is very physical.  Right now planning the [employer].  I also go to [employer] to lift weights but have taken a break from that b/c the machines were hurting my hip.    PATIENT GOALS: be lighter on my feet, less pain, get stronger  NEXT MD VISIT: approx 1 mo to see if  needs an intra-articular injection  OBJECTIVE:  Note: Objective measures were completed at Evaluation unless otherwise noted.  DIAGNOSTIC  FINDINGS:  X-ray of Rt hip on 06/05/23 images right hip shows severe osteoarthritis  PATIENT SURVEYS:  LEFS: 39/80 - hardest activities are heavy activiites at home, getting in/out of car, sitting x1 hour, rolling over in bed (all rated 2) 7/22:  LEFS 44/80  COGNITION: Overall cognitive status: Within functional limits for tasks assessed     SENSATION: WFL  MUSCLE LENGTH:   POSTURE: scoliosis  PALPATION: Adductors tight and tender Piriformis and gluteals tight and tender  LUMBAR ROM:  All WNL and painfree  LOWER EXTREMITY ROM:     Left hip full ROM and painfree  Passive  Right eval Left eval 7/22  Hip flexion 90 deg, pain  115 but with increased external rotation to avoid the pain  Hip extension     Hip abduction 40 deg, pain  45  Hip adduction     Hip internal rotation To neutral from ER, pain  10  Hip external rotation Tight end range  Tightness at end range   Knee flexion WNL    Knee extension WNL    Ankle dorsiflexion     Ankle plantarflexion     Ankle inversion     Ankle eversion      (Blank rows = not tested)  LOWER EXTREMITY MMT:    MMT Right eval Left eval 7/22  Hip flexion 4- 4+ R 4  Hip extension 4 4+ R 4+  Hip abduction 3+ 4 R 4-  Hip adduction 4- 5   Hip internal rotation 3+ 4+ R 4-  Hip external rotation 3+ 4+ R 4-  Knee flexion 4 4+   Knee extension 4+ 4+   Ankle dorsiflexion     Ankle plantarflexion     Ankle inversion     Ankle eversion      (Blank rows = not tested)    FUNCTIONAL TESTS:  5 times sit to stand: 14.49 no UE assist, anterior hip and thigh pain Timed up and go (TUG): 7.42  7/22: 5X STS 9.62  GAIT: Distance walked:  Assistive device utilized: None Level of assistance: Complete Independence Comments: Rt hip is ER, Pt using hip adductors as flexors, shorter stride length on  Rt  TREATMENT DATE:   09/01/23 Nustep level 3 for 6 min- PT student present to discuss pt status Supine posterior pelvic tilt 2x10 Supine adductor squeeze with ball between knees 2x10 Supine hip ER fall outs 2x10 Supine heel press into red stability ball for hamstring activation 3x10  Supine glute bridges 3x10 Prone hamstring curls 2x10 Side lying reverse clamshells 2x10 on each side  Hip flexed /leg straight over dumbell back and forth in long sitting 2x10 each side Seated hamstring stretch 2x20sec   08/04/23: Self care: discussed results of LEFS and 5x STS; pt has questions regarding upcoming injection, surgical approaches, whether she needs imaging; benefit of glute strengthening to unload the joint Standing Hip thrust with heavy 50# power cord (anchored on stair railing) 20x Leg swing (dangle) off edge of step (add weight next time) Supine right hip mobilization: grade 3/4:  long axis distraction with belt, inferior mobs with belt, AP in internal rotation, lateral mobs with belt   07/24/23: NuStep L3 x 5 min  PT present to discuss status 1/2 kneel with glute squeeze 1x then lunge forward  Standing Hip thrust with heavy 50# power cord (anchored on stair railing) 20x Supine glute bridge 10x Supine hip long axis distraction  Trigger Point Dry Needling Subsequent Treatment: Instructions provided previously at  initial dry needling treatment.  Patient Verbal Consent Given: Yes Education Handout Provided: Previously Provided Muscles Treated:  right gluteals in sidelying; supine iliacus; supine adductors Electrical Stimulation Performed: No Treatment Response/Outcome: Utilized skilled palpation to identify bony landmarks and trigger points.  Able to illicit twitch response and muscle elongation.  Soft tissue mobilization to muscles needled to further promote tissue elongation and decreased pain.      PATIENT EDUCATION:  Education details: [email or web address] Person educated: Patient Education  method: Explanation, Demonstration, Verbal cues, and Handouts Education comprehension: verbalized understanding and returned demonstration   HOME EXERCISE PROGRAM: Access Code: [email or web address] URL: [email or web address] Date: 07/13/2023 Prepared by: Mliss  Exercises - Hip Flexor Stretch at Edge of Bed  - 2-3 x daily - 7 x weekly - 1 sets - 2 reps - 30 hold - Supine Piriformis Stretch with Leg Straight  - 2-3 x daily - 7 x weekly - 1 sets - 2 reps - 30 hold - Figure 4 Bridge  - 1 x daily - 7 x weekly - 2 sets - 10 reps - Squat with Chair Touch  - 1 x daily - 7 x weekly - 1 sets - 10 reps - Runner's Step Up/Down  - 1 x daily - 7 x weekly - 2 sets - 10 reps - Hip Hiking on Step  - 1 x daily - 7 x weekly - 2 sets - 10 reps - Forward T with Counter Support  - 1 x daily - 7 x weekly - 2 sets - 10 reps - Sidelying Hip Abduction  - 1 x daily - 7 x weekly - 2 sets - 10 reps - Seated Hip Flexor Stretch  - 2 x daily - 7 x weekly - 1 sets - 3 reps - 30-60 sec hold - Psoas Mobilization with Small Ball (Mirrored)  - 1 x daily - 7 x weekly - 1 sets - 1 reps - 30-60 sec hold - Prone Press Up  - 3 x daily - 7 x weekly - 1-3 sets - 10 reps - Kneeling Adductor Stretch with Hip External Rotation  - 1 x daily - 3-4 x weekly - 1 sets - 3 reps - 30-60 sec hold - Prone Alternating Arm and Leg Lifts  - 1 x daily - 3 x weekly - 3 sets - 10 reps - 5 sec hold  ASSESSMENT:  CLINICAL IMPRESSION:   Lauren [name] presents to therapy with complaints of R hip pain and hip muscle weakness with clearance from Dr Carleton to resume PT. She goes for MRI this Sunday on Right hip and her doctor has told her she needs surgery and is 'bone on bone'. Pt goes to see Dr Vernetta 09/10/23. Today's session focused on hip muscle strengthening mostly in supine position due to pt hx of OA. Pt presented with bilateral hamstring muscle weakness, thus was instructed to perform several hamstring exercises. She tolerated all exercises well with  no adverse reactions and stated feeling good by end of PT. Plan to continue LE/hip strengthening for next session and balancing, with reassessment performed last visit (visit 9) and at that time, she felt she was having limited progress.  Patient has since had a Cortisone injection and did start to feel some improvements and was cleared to return to PT. Pt will benefit from skilled PT to address the deficits below and possibly not need hip surgery.     OBJECTIVE IMPAIRMENTS: Abnormal gait, decreased activity tolerance, decreased balance, decreased knowledge of condition, decreased mobility, decreased  ROM, decreased strength, hypomobility, increased muscle spasms, impaired flexibility, postural dysfunction, and pain.   ACTIVITY LIMITATIONS: carrying, lifting, bending, sitting, squatting, sleeping, stairs, transfers, and locomotion level  PARTICIPATION LIMITATIONS: cleaning, driving, community activity, occupation, and yard work  PERSONAL FACTORS: 1 comorbidity: falls related to weakness of Rt hip are also affecting patient's functional outcome.   REHAB POTENTIAL: Excellent  CLINICAL DECISION MAKING: Stable/uncomplicated  EVALUATION COMPLEXITY: Low   GOALS: Goals reviewed with patient? Yes  SHORT TERM GOALS: Target date: 07/03/23  Pt will be ind with initial HEP without exacerbation of pain Baseline: Goal status: IN PROGRESS 6/16 (fig 4  and ADDuction hurt)  2.  Pt will be able to squat and march to climb stairs in sagittal plane with minimal pain Baseline:  Goal status: PARTIALLY MET for stairs 06/29/23  3.  Pt will express understanding of movement as medicine and take frequent breaks from static positions to avoid stiffness related pain Baseline:  Goal status: MET    LONG TERM GOALS: Target date: 09/29/2023  Pt will be ind with advanced HEP and understand importance of compliance upon d/c from PT Baseline:  Goal status: INITIAL  2.  Pt will improve LEFS by at least 9 points  to demo improved function Baseline: 39/80, goal will be met at 48/80 Goal status: Progressing   3.  Pt will report pain not to exceed 4/10 with getting in and out of car Baseline: 7/10 Goal status: Progressing   4.  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  Pt will improve Rt hip strength to allow her to step up onto 8 inch step while holding 10lb box to simulate work tasks Baseline:  Goal status: Progressing  6.  Pt will report pain with daily activities to improve by at least 50% Baseline:  Goal status: Progressing  PLAN:  PT FREQUENCY: 2x/week  PT DURATION: 8 weeks  PLANNED INTERVENTIONS: 97110-Therapeutic exercises, 97530- Therapeutic activity, 97112- Neuromuscular re-education, 97535- Self Care, 02859- Manual therapy, [phone]- Gait training, [phone]- Aquatic Therapy, [phone]- Electrical stimulation (unattended), [phone]- Ionotophoresis 4mg /ml Dexamethasone , Patient/Family education, Balance training, Stair training, Taping, Dry Needling, Joint mobilization, Spinal mobilization, Cryotherapy, and Moist heat.  PLAN FOR NEXT SESSION: assess tolerance to last visit and progress, as appropriate.  Follow up with MRI and visit with Dr Vernetta.   Lauren [name], SPT 09/01/23 2:19 PM  I agree with the following treatment note after reviewing documentation. This session was performed under the supervision of a licensed clinician. Jarrell Laming, PT, DPT 09/01/23, 2:19 PM  [facility] [address], [address] [place], [place] [address] Phone # [phone] Fax [phone]

## 2023-09-01 NOTE — Addendum Note (Signed)
 Addended by: ESTELLE GILLIS D on: 09/01/2023 09:27 AM   Modules accepted: Orders

## 2023-09-01 NOTE — Telephone Encounter (Signed)
 Spoke with patient and she would like go back the pt that Dr. Joane sent her to outpatient rehab at [place] for her leg and hip pain.  Referral placed.

## 2023-09-06 ENCOUNTER — Other Ambulatory Visit (HOSPITAL_BASED_OUTPATIENT_CLINIC_OR_DEPARTMENT_OTHER)

## 2023-09-06 ENCOUNTER — Ambulatory Visit (HOSPITAL_BASED_OUTPATIENT_CLINIC_OR_DEPARTMENT_OTHER)

## 2023-09-07 ENCOUNTER — Ambulatory Visit (INDEPENDENT_AMBULATORY_CARE_PROVIDER_SITE_OTHER)

## 2023-09-07 ENCOUNTER — Telehealth: Payer: Self-pay | Admitting: Family Medicine

## 2023-09-07 DIAGNOSIS — G8929 Other chronic pain: Secondary | ICD-10-CM

## 2023-09-07 DIAGNOSIS — M25551 Pain in right hip: Secondary | ICD-10-CM

## 2023-09-07 DIAGNOSIS — M1611 Unilateral primary osteoarthritis, right hip: Secondary | ICD-10-CM

## 2023-09-07 DIAGNOSIS — M47816 Spondylosis without myelopathy or radiculopathy, lumbar region: Secondary | ICD-10-CM | POA: Diagnosis not present

## 2023-09-07 DIAGNOSIS — Z90710 Acquired absence of both cervix and uterus: Secondary | ICD-10-CM | POA: Diagnosis not present

## 2023-09-07 DIAGNOSIS — S73191A Other sprain of right hip, initial encounter: Secondary | ICD-10-CM | POA: Diagnosis not present

## 2023-09-07 DIAGNOSIS — F331 Major depressive disorder, recurrent, moderate: Secondary | ICD-10-CM | POA: Diagnosis not present

## 2023-09-07 NOTE — Telephone Encounter (Signed)
 Copied from CRM #[id number]. Topic: Appointments - Scheduling Inquiry for Clinic >> Sep 07, 2023 10:39 AM Larissa RAMAN wrote: Reason for CRM: Patient calling to schedule physical therapy appointment. Please contact patient for scheduling

## 2023-09-09 ENCOUNTER — Ambulatory Visit: Payer: Self-pay | Admitting: Family Medicine

## 2023-09-09 NOTE — Telephone Encounter (Signed)
 Copied from CRM #[id number]. Topic: Clinical - Lab/Test Results >> Sep 09, 2023  2:47 PM Lauren [name] wrote: Reason for CRM: Patient called because she needs her MRI results asap for her surgery appointment tomorrow. Please call her at [phone]

## 2023-09-09 NOTE — Telephone Encounter (Signed)
 Called reading room and they will get it read tonight to have for appointment tomorrow.

## 2023-09-10 ENCOUNTER — Ambulatory Visit: Admitting: Orthopaedic Surgery

## 2023-09-10 DIAGNOSIS — M1611 Unilateral primary osteoarthritis, right hip: Secondary | ICD-10-CM

## 2023-09-10 NOTE — Progress Notes (Signed)
 The patient is a 68 year old female that I am seeing for the first time.  She is sent to me from Dr. Artist Lloyd to evaluate and treat known severe bone-on-bone end-stage arthritis of her right hip.  She has had an intra-articular steroid injection this month in her right hip and that only temporize her pain.  This has been slowly getting worse since March of this year when she had mechanical fall.  She is very active.  She is thin.  She is not diabetic.  She is not on blood thinning medications either.  I was able to review her past medical history and medications within epic.  At this point her right hip pain is daily and it is 10 out of 10.  It is detrimentally affecting her mobility, her quality of life and her actives day living to the point she does wish to discuss hip replacement surgery.  Examination of her right hip shows it is significantly stiff with internal and external Tatian and she has a lot of pain in the groin with attempted rotation.  The left hip moves smoothly and fluidly.  I did have her lay supine and there is a leg length difference with her right side being shorter than the left.  X-rays of her pelvis and right hip as well as MRI of the right hip are on the canopy system.  I did pull these up for the patient and reviewed these with her.  There is significant bone-on-bone arthritis of the right hip.  The joint space is completely gone and there are large osteophytes all around the hip.  Even the MRI shows cystic changes and significant subchondral edema throughout the hip.  We had a long and thorough discussion about hip replacement surgery.  I showed her hip replacement model and gave her handout about hip replacement surgery.  We discussed the risk and benefits of the surgery and what to expect from an intraoperative and postoperative standpoint.  All questions and concerns were answered addressed.  Will work on getting her on the schedule in the near future.

## 2023-09-10 NOTE — Progress Notes (Signed)
 Patient reviewed via MyChart.

## 2023-09-10 NOTE — Telephone Encounter (Signed)
 Left message on machine that ortho should be able to see mri results now.

## 2023-09-13 ENCOUNTER — Other Ambulatory Visit (HOSPITAL_BASED_OUTPATIENT_CLINIC_OR_DEPARTMENT_OTHER)

## 2023-09-17 ENCOUNTER — Other Ambulatory Visit: Payer: Self-pay | Admitting: Family

## 2023-09-23 ENCOUNTER — Encounter: Payer: Self-pay | Admitting: Rehabilitative and Restorative Service Providers"

## 2023-09-23 ENCOUNTER — Ambulatory Visit: Attending: Family Medicine | Admitting: Rehabilitative and Restorative Service Providers"

## 2023-09-23 DIAGNOSIS — M6281 Muscle weakness (generalized): Secondary | ICD-10-CM | POA: Insufficient documentation

## 2023-09-23 DIAGNOSIS — M25551 Pain in right hip: Secondary | ICD-10-CM | POA: Diagnosis present

## 2023-09-23 NOTE — Therapy (Signed)
 OUTPATIENT PHYSICAL THERAPY TREATMENT  NOTE   Patient Name: Lauren [name] MRN: [id number] DOB:24-Oct-1955, 68 y.o., female Today's Date: 09/23/2023     END OF SESSION:  PT End of Session - 09/23/23 1021     Visit Number 11    Date for PT Re-Evaluation 09/29/23    Authorization Type [employer] - medical necessity    Progress Note Due on Visit 20    PT Start Time 1020    PT Stop Time 1100    PT Time Calculation (min) 40 min    Activity Tolerance Patient tolerated treatment well    Behavior During Therapy [facility] for tasks assessed/performed                Past Medical History:  Diagnosis Date   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   Chicken pox as a child   Depression    been on antidepressants on and off for 20 yr   GERD (gastroesophageal reflux disease)    silent with hiatal hernia   Hiatal hernia 05/22/2013   Hiatal hernia with gastroesophageal reflux 05/22/2013   Hot tub folliculitis 12/18/2013   Hyperglycemia 05/19/2016   Hyperlipidemia 05/19/2016   Measles as a child   Postmenopausal atrophic vaginitis 05/22/2013   Preventative health care 01/19/2015   Shingles 68 yrs old   Unspecified vitamin D  deficiency 05/22/2013   Past Surgical History:  Procedure Laterality Date   ABDOMINAL HYSTERECTOMY  1998   partial-still has ovaries   BILIARY STENT PLACEMENT N/A 11/23/2019   Procedure: BILIARY STENT PLACEMENT;  Surgeon: Rollin Dover, MD;  Location: [facility];  Service: Endoscopy;  Laterality: N/A;   CHOLECYSTECTOMY N/A 01/27/2023   Procedure: OPEN CHOLECYSTECTOMY, DRAINAGE OF INTRA-ABDOMINAL ABSCESS;  Surgeon: Ebbie Cough, MD;  Location: [facility];  Service: General;  Laterality: N/A;   ENDOSCOPIC RETROGRADE CHOLANGIOPANCREATOGRAPHY (ERCP) WITH PROPOFOL  N/A 06/01/2020   Procedure: ENDOSCOPIC RETROGRADE CHOLANGIOPANCREATOGRAPHY (ERCP) WITH PROPOFOL ;  Surgeon: Rollin Dover, MD;  Location: [facility];  Service: Endoscopy;  Laterality: N/A;   ERCP N/A  11/23/2019   Procedure: ENDOSCOPIC RETROGRADE CHOLANGIOPANCREATOGRAPHY (ERCP);  Surgeon: Rollin Dover, MD;  Location: [facility];  Service: Endoscopy;  Laterality: N/A;   ESOPHAGECTOMY  05-2008   INDUCED ABORTION  1979   REMOVAL OF STONES  11/23/2019   Procedure: REMOVAL OF STONES;  Surgeon: Rollin Dover, MD;  Location: [facility];  Service: Endoscopy;;   SPHINCTEROTOMY  11/23/2019   Procedure: ANNETT;  Surgeon: Rollin Dover, MD;  Location: [facility];  Service: Endoscopy;;   STENT REMOVAL  06/01/2020   Procedure: STENT REMOVAL;  Surgeon: Rollin Dover, MD;  Location: [facility];  Service: Endoscopy;;   Patient Active Problem List   Diagnosis Date Noted   Unilateral primary osteoarthritis, right hip 06/04/2023   Protein-calorie malnutrition, severe 01/29/2023   Perforated bowel (HCC) 01/26/2023   Perforated gallbladder 01/26/2023   Dysphagia 10/17/2022   Esophageal stricture 10/17/2022   History of malignant neoplasm of esophagus 10/17/2022   Leg pain 06/25/2022   Elevated liver enzymes 07/25/2021   Memory changes 10/15/2020   Ulnar neuropathy 12/27/2019   Abnormal LFTs    Choledocholithiasis with obstruction 11/22/2019   Mass of skin of left thumb 08/31/2019   Mucoid cyst, joint 08/31/2019   Osteoarthritis of finger of left hand 08/31/2019   Hyperglycemia 05/19/2016   Hyperlipidemia 05/19/2016   Hiatal hernia with gastroesophageal reflux 05/22/2013   Postmenopausal atrophic vaginitis 05/22/2013   Vitamin D  deficiency 05/22/2013   Chicken pox  History of shingles    Measles    Cancer (HCC)    Depression     PCP: Glade Horton, MD  REFERRING PROVIDER: Joane Artist RAMAN, MD  REFERRING DIAG: [phone] (ICD-10-CM) - Right leg pain M25.551 (ICD-10-CM) - Right hip pain  Rationale for Evaluation and Treatment: Rehabilitation  THERAPY DIAG:  Pain in right hip  Muscle weakness (generalized)  ONSET DATE: approx 3 mos ago - has had some falls  SUBJECTIVE:                                                                                                                                                                                            SUBJECTIVE STATEMENT:  Pt states that she went to Dr Vernetta and he is going to do a THA.  Surgery scheduled for 10/30/2023 with post-op follow up on 11/12/23.     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 She had an esophagectomy in 2010. She has been in remission now for 15 years  PAIN:  PAIN:  Are you having pain? Yes NPRS scale: 3/10 Pain location: Rt knee and butt Pain orientation: Right  PAIN TYPE: aching, dull, and sharp Pain description: intermittent  Aggravating factors: hip abduction, getting out of the car, rolling in bed at night, climbing deep stairs Relieving factors: ice, stretching, hot tub, DN   PRECAUTIONS: Fall  RED FLAGS: None   WEIGHT BEARING RESTRICTIONS: No  FALLS:  Has patient fallen in last 6 months? Yes. Number of falls 1  LIVING ENVIRONMENT: Lives with: lives with their spouse Lives in: House/apartment Stairs: Yes: Internal: 13 steps; on right going up and External: 4 steps; on left going up Has following equipment at home: None  OCCUPATION: self-employed - throws outdoor festivals, part-time   PLOF: Independent and Leisure: I throw outdoor festivals which is very physical.  Right now planning the [employer].  I also go to [employer] to lift weights but have taken a break from that b/c the machines were hurting my hip.    PATIENT GOALS: be lighter on my feet, less pain, get stronger  NEXT MD VISIT: approx 1 mo to see if needs an intra-articular injection  OBJECTIVE:  Note: Objective measures were completed at Evaluation unless otherwise noted.  DIAGNOSTIC FINDINGS:  X-ray of Rt hip on 06/05/23 images right hip shows severe osteoarthritis  Right Hip MRI on  09/07/2023: IMPRESSION: 1. Severe osteoarthritis of the right hip resulting in bone-on-bone contact and pronounced marrow edema of the femoral head and acetabulum. 2. Diffuse labral  degeneration with tear of the anterosuperior labrum.   PATIENT SURVEYS:  LEFS: 39/80 - hardest activities are heavy activiites at home, getting in/out of car, sitting x1 hour, rolling over in bed (all rated 2) 7/22:  LEFS 44/80  COGNITION: Overall cognitive status: Within functional limits for tasks assessed     SENSATION: WFL  MUSCLE LENGTH:   POSTURE: scoliosis  PALPATION: Adductors tight and tender Piriformis and gluteals tight and tender  LUMBAR ROM:  All WNL and painfree  LOWER EXTREMITY ROM:     Left hip full ROM and painfree  Passive  Right eval Left eval 7/22  Hip flexion 90 deg, pain  115 but with increased external rotation to avoid the pain  Hip extension     Hip abduction 40 deg, pain  45  Hip adduction     Hip internal rotation To neutral from ER, pain  10  Hip external rotation Tight end range  Tightness at end range   Knee flexion WNL    Knee extension WNL    Ankle dorsiflexion     Ankle plantarflexion     Ankle inversion     Ankle eversion      (Blank rows = not tested)  LOWER EXTREMITY MMT:    MMT Right eval Left eval 7/22  Hip flexion 4- 4+ R 4  Hip extension 4 4+ R 4+  Hip abduction 3+ 4 R 4-  Hip adduction 4- 5   Hip internal rotation 3+ 4+ R 4-  Hip external rotation 3+ 4+ R 4-  Knee flexion 4 4+   Knee extension 4+ 4+   Ankle dorsiflexion     Ankle plantarflexion     Ankle inversion     Ankle eversion      (Blank rows = not tested)    FUNCTIONAL TESTS:  5 times sit to stand: 14.49 no UE assist, anterior hip and thigh pain Timed up and go (TUG): 7.42  7/22: 5X STS 9.62  GAIT: Distance walked:  Assistive device utilized: None Level of assistance: Complete Independence Comments: Rt hip is ER, Pt using hip adductors as flexors, shorter stride  length on Rt  TREATMENT DATE:   09/23/2023: Recumbent bike level 3 x6 min with PT present to discuss status and become updated on MD appointment Supine posterior pelvic tilt 2x10 Supine adductor squeeze with ball between knees 2x10 Supine hip ER fall outs 2x10 Supine heel press into red pball for hamstring activation 3x10 Supine bridges with LE on red pball 2x10 Sidelying clamshell with red loop 2x10 bilat Sidelying reverse clamshell with red loop 2x10 bilat Quadruped fire hydrants 2x10 bilat Long sitting with straight leg raise up and over small cone x10 bilat   09/01/23 Nustep level 3 for 6 min- PT student present to discuss pt status Supine posterior pelvic tilt 2x10 Supine adductor squeeze with ball between knees 2x10 Supine hip ER fall outs 2x10 Supine heel press into red stability ball for hamstring activation 3x10  Supine glute bridges 3x10 Prone hamstring curls 2x10 Side lying reverse clamshells 2x10 on each side  Hip flexed /leg straight over dumbell back and forth in long sitting 2x10 each side Seated hamstring stretch 2x20sec   08/04/23: Self care: discussed results of LEFS and 5x STS; pt has questions regarding upcoming injection, surgical approaches, whether she needs imaging; benefit of glute strengthening to unload the joint Standing Hip thrust with heavy 50# power cord (anchored on stair railing) 20x Leg swing (dangle) off edge of step (  add weight next time) Supine right hip mobilization: grade 3/4:  long axis distraction with belt, inferior mobs with belt, AP in internal rotation, lateral mobs with belt   07/24/23: NuStep L3 x 5 min  PT present to discuss status 1/2 kneel with glute squeeze 1x then lunge forward  Standing Hip thrust with heavy 50# power cord (anchored on stair railing) 20x Supine glute bridge 10x Supine hip long axis distraction  Trigger Point Dry Needling Subsequent Treatment: Instructions provided previously at initial dry needling  treatment.  Patient Verbal Consent Given: Yes Education Handout Provided: Previously Provided Muscles Treated:  right gluteals in sidelying; supine iliacus; supine adductors Electrical Stimulation Performed: No Treatment Response/Outcome: Utilized skilled palpation to identify bony landmarks and trigger points.  Able to illicit twitch response and muscle elongation.  Soft tissue mobilization to muscles needled to further promote tissue elongation and decreased pain.      PATIENT EDUCATION:  Education details: [email or web address] Person educated: Patient Education method: Explanation, Demonstration, Verbal cues, and Handouts Education comprehension: verbalized understanding and returned demonstration   HOME EXERCISE PROGRAM: Access Code: [email or web address] URL: [email or web address] Date: 07/13/2023 Prepared by: Mliss  Exercises - Hip Flexor Stretch at Edge of Bed  - 2-3 x daily - 7 x weekly - 1 sets - 2 reps - 30 hold - Supine Piriformis Stretch with Leg Straight  - 2-3 x daily - 7 x weekly - 1 sets - 2 reps - 30 hold - Figure 4 Bridge  - 1 x daily - 7 x weekly - 2 sets - 10 reps - Squat with Chair Touch  - 1 x daily - 7 x weekly - 1 sets - 10 reps - Runner's Step Up/Down  - 1 x daily - 7 x weekly - 2 sets - 10 reps - Hip Hiking on Step  - 1 x daily - 7 x weekly - 2 sets - 10 reps - Forward T with Counter Support  - 1 x daily - 7 x weekly - 2 sets - 10 reps - Sidelying Hip Abduction  - 1 x daily - 7 x weekly - 2 sets - 10 reps - Seated Hip Flexor Stretch  - 2 x daily - 7 x weekly - 1 sets - 3 reps - 30-60 sec hold - Psoas Mobilization with Small Ball (Mirrored)  - 1 x daily - 7 x weekly - 1 sets - 1 reps - 30-60 sec hold - Prone Press Up  - 3 x daily - 7 x weekly - 1-3 sets - 10 reps - Kneeling Adductor Stretch with Hip External Rotation  - 1 x daily - 3-4 x weekly - 1 sets - 3 reps - 30-60 sec hold - Prone Alternating Arm and Leg Lifts  - 1 x daily - 3 x weekly - 3 sets - 10 reps - 5 sec  hold  ASSESSMENT:  CLINICAL IMPRESSION:  Ms Fakhouri presents to skilled PT reporting that she has her hip arthroplasty scheduled with Dr Vernetta.  Patient is progressing with strengthening and hip mobility.  Patient will be ready for discharge next visit as she prepares for her hip surgery.  Patient continues with overall less pain than at evaluation and is progressing towards goals.  Patient to have anticipated DC next visit.   OBJECTIVE IMPAIRMENTS: Abnormal gait, decreased activity tolerance, decreased balance, decreased knowledge of condition, decreased mobility, decreased ROM, decreased strength, hypomobility, increased muscle spasms, impaired flexibility, postural dysfunction, and pain.   ACTIVITY LIMITATIONS: carrying, lifting, bending, sitting,  squatting, sleeping, stairs, transfers, and locomotion level  PARTICIPATION LIMITATIONS: cleaning, driving, community activity, occupation, and yard work  PERSONAL FACTORS: 1 comorbidity: falls related to weakness of Rt hip are also affecting patient's functional outcome.   REHAB POTENTIAL: Excellent  CLINICAL DECISION MAKING: Stable/uncomplicated  EVALUATION COMPLEXITY: Low   GOALS: Goals reviewed with patient? Yes  SHORT TERM GOALS: Target date: 07/03/23  Pt will be ind with initial HEP without exacerbation of pain Baseline: Goal status: IN PROGRESS 6/16 (fig 4  and ADDuction hurt)  2.  Pt will be able to squat and march to climb stairs in sagittal plane with minimal pain Baseline:  Goal status: PARTIALLY MET for stairs 06/29/23  3.  Pt will express understanding of movement as medicine and take frequent breaks from static positions to avoid stiffness related pain Baseline:  Goal status: MET    LONG TERM GOALS: Target date: 09/29/2023  Pt will be ind with advanced HEP and understand importance of compliance upon d/c from PT Baseline:  Goal status: INITIAL  2.  Pt will improve LEFS by at least 9 points to demo improved  function Baseline: 39/80, goal will be met at 48/80 Goal status: Progressing   3.  Pt will report pain not to exceed 4/10 with getting in and out of car Baseline: 7/10 Goal status: Progressing   4.  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  Pt will improve Rt hip strength to allow her to step up onto 8 inch step while holding 10lb box to simulate work tasks Baseline:  Goal status: Progressing  6.  Pt will report pain with daily activities to improve by at least 50% Baseline:  Goal status: Progressing  PLAN:  PT FREQUENCY: 2x/week  PT DURATION: 8 weeks  PLANNED INTERVENTIONS: 97110-Therapeutic exercises, 97530- Therapeutic activity, 97112- Neuromuscular re-education, 97535- Self Care, [id number]- Manual therapy, [phone]- Gait training, [phone]- Aquatic Therapy, [phone]- Electrical stimulation (unattended), [phone]- Ionotophoresis 4mg /ml Dexamethasone , Patient/Family education, Balance training, Stair training, Taping, Dry Needling, Joint mobilization, Spinal mobilization, Cryotherapy, and Moist heat.  PLAN FOR NEXT SESSION: dry needling if indicated, discharge visit    Nori Poland, PT, DPT 09/23/23, 11:01 AM  [facility] [address] [place], [place] [address] Phone # [phone] Fax [phone]

## 2023-09-25 ENCOUNTER — Ambulatory Visit: Admitting: Physical Therapy

## 2023-09-28 ENCOUNTER — Ambulatory Visit (INDEPENDENT_AMBULATORY_CARE_PROVIDER_SITE_OTHER): Admitting: Rehabilitative and Restorative Service Providers"

## 2023-09-28 ENCOUNTER — Encounter: Payer: Self-pay | Admitting: Rehabilitative and Restorative Service Providers"

## 2023-09-28 DIAGNOSIS — M6281 Muscle weakness (generalized): Secondary | ICD-10-CM

## 2023-09-28 DIAGNOSIS — M25551 Pain in right hip: Secondary | ICD-10-CM

## 2023-09-28 NOTE — Therapy (Signed)
 OUTPATIENT PHYSICAL THERAPY TREATMENT  NOTE   Patient Name: Lauren [name] MRN: [id number] DOB:[birth year], 68 y.o., female Today's Date: 09/28/2023     END OF SESSION:  PT End of Session - 09/28/23 1232     Visit Number 12    Date for PT Re-Evaluation 09/29/23    Authorization Type [employer] - medical necessity    Progress Note Due on Visit 20    PT Start Time 1230    PT Stop Time 1310    PT Time Calculation (min) 40 min    Activity Tolerance Patient tolerated treatment well    Behavior During Therapy [facility] for tasks assessed/performed                Past Medical History:  Diagnosis Date   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   Chicken pox as a child   Depression    been on antidepressants on and off for 20 yr   GERD (gastroesophageal reflux disease)    silent with hiatal hernia   Hiatal hernia 05/22/2013   Hiatal hernia with gastroesophageal reflux 05/22/2013   Hot tub folliculitis 12/18/2013   Hyperglycemia 05/19/2016   Hyperlipidemia 05/19/2016   Measles as a child   Postmenopausal atrophic vaginitis 05/22/2013   Preventative health care 01/19/2015   Shingles 68 yrs old   Unspecified vitamin D  deficiency 05/22/2013   Past Surgical History:  Procedure Laterality Date   ABDOMINAL HYSTERECTOMY  1998   partial-still has ovaries   BILIARY STENT PLACEMENT N/A 11/23/2019   Procedure: BILIARY STENT PLACEMENT;  Surgeon: Rollin Dover, MD;  Location: [facility];  Service: Endoscopy;  Laterality: N/A;   CHOLECYSTECTOMY N/A 01/27/2023   Procedure: OPEN CHOLECYSTECTOMY, DRAINAGE OF INTRA-ABDOMINAL ABSCESS;  Surgeon: Ebbie Cough, MD;  Location: [facility];  Service: General;  Laterality: N/A;   ENDOSCOPIC RETROGRADE CHOLANGIOPANCREATOGRAPHY (ERCP) WITH PROPOFOL  N/A 06/01/2020   Procedure: ENDOSCOPIC RETROGRADE CHOLANGIOPANCREATOGRAPHY (ERCP) WITH PROPOFOL ;  Surgeon: Rollin Dover, MD;  Location: [facility];  Service: Endoscopy;  Laterality: N/A;   ERCP N/A  11/23/2019   Procedure: ENDOSCOPIC RETROGRADE CHOLANGIOPANCREATOGRAPHY (ERCP);  Surgeon: Rollin Dover, MD;  Location: [facility];  Service: Endoscopy;  Laterality: N/A;   ESOPHAGECTOMY  05-2008   INDUCED ABORTION  1979   REMOVAL OF STONES  11/23/2019   Procedure: REMOVAL OF STONES;  Surgeon: Rollin Dover, MD;  Location: [facility];  Service: Endoscopy;;   SPHINCTEROTOMY  11/23/2019   Procedure: ANNETT;  Surgeon: Rollin Dover, MD;  Location: [facility];  Service: Endoscopy;;   STENT REMOVAL  06/01/2020   Procedure: STENT REMOVAL;  Surgeon: Rollin Dover, MD;  Location: [facility];  Service: Endoscopy;;   Patient Active Problem List   Diagnosis Date Noted   Unilateral primary osteoarthritis, right hip 06/04/2023   Protein-calorie malnutrition, severe 01/29/2023   Perforated bowel (HCC) 01/26/2023   Perforated gallbladder 01/26/2023   Dysphagia 10/17/2022   Esophageal stricture 10/17/2022   History of malignant neoplasm of esophagus 10/17/2022   Leg pain 06/25/2022   Elevated liver enzymes 07/25/2021   Memory changes 10/15/2020   Ulnar neuropathy 12/27/2019   Abnormal LFTs    Choledocholithiasis with obstruction 11/22/2019   Mass of skin of left thumb 08/31/2019   Mucoid cyst, joint 08/31/2019   Osteoarthritis of finger of left hand 08/31/2019   Hyperglycemia 05/19/2016   Hyperlipidemia 05/19/2016   Hiatal hernia with gastroesophageal reflux 05/22/2013   Postmenopausal atrophic vaginitis 05/22/2013   Vitamin D  deficiency 05/22/2013   Chicken pox  History of shingles    Measles    Cancer (HCC)    Depression     PCP: Glade Horton, MD  REFERRING PROVIDER: Joane Artist RAMAN, MD  REFERRING DIAG: [phone] (ICD-10-CM) - Right leg pain M25.551 (ICD-10-CM) - Right hip pain  Rationale for Evaluation and Treatment: Rehabilitation  THERAPY DIAG:  Pain in right hip  Muscle weakness (generalized)  ONSET DATE: approx 3 mos ago - has had some falls  SUBJECTIVE:                                                                                                                                                                                            SUBJECTIVE STATEMENT:  Patient reports that she is having more hip pain today, states that she leaves tomorrow for her trip and is hoping to be needled today prior to going out of town. Surgery scheduled for 10/30/2023 with post-op follow up on 11/12/23.     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 She had an esophagectomy in 2010. She has been in remission now for 15 years  PAIN:  PAIN:  Are you having pain? Yes NPRS scale: 6/10 Pain location: Rt hip Pain orientation: Right  PAIN TYPE: aching, dull, and sharp Pain description: intermittent  Aggravating factors: hip abduction, getting out of the car, rolling in bed at night, climbing deep stairs Relieving factors: ice, stretching, hot tub, DN   PRECAUTIONS: Fall  RED FLAGS: None   WEIGHT BEARING RESTRICTIONS: No  FALLS:  Has patient fallen in last 6 months? Yes. Number of falls 1  LIVING ENVIRONMENT: Lives with: lives with their spouse Lives in: House/apartment Stairs: Yes: Internal: 13 steps; on right going up and External: 4 steps; on left going up Has following equipment at home: None  OCCUPATION: self-employed - throws outdoor festivals, part-time   PLOF: Independent and Leisure: I throw outdoor festivals which is very physical.  Right now planning the [employer].  I also go to [employer] to lift weights but have taken a break from that b/c the machines were hurting my hip.    PATIENT GOALS: be lighter on my feet, less pain, get stronger  NEXT MD VISIT: approx 1 mo to see if needs an intra-articular injection  OBJECTIVE:  Note: Objective measures were completed at Evaluation unless otherwise noted.  DIAGNOSTIC FINDINGS:  X-ray of Rt  hip on 06/05/23 images right hip shows severe osteoarthritis  Right Hip MRI on 09/07/2023: IMPRESSION: 1. Severe osteoarthritis of the right hip resulting in bone-on-bone contact and  pronounced marrow edema of the femoral head and acetabulum. 2. Diffuse labral degeneration with tear of the anterosuperior labrum.   PATIENT SURVEYS:  LEFS: 39/80 - hardest activities are heavy activiites at home, getting in/out of car, sitting x1 hour, rolling over in bed (all rated 2) 7/22:  LEFS 44/80 09/28/2023:  Lower Extremity Functional Score: 40 / 80 = 50.0 %  COGNITION: Overall cognitive status: Within functional limits for tasks assessed     SENSATION: WFL  MUSCLE LENGTH:   POSTURE: scoliosis  PALPATION: Adductors tight and tender Piriformis and gluteals tight and tender  LUMBAR ROM:  All WNL and painfree  LOWER EXTREMITY ROM:     Left hip full ROM and painfree  Passive  Right eval Left eval 7/22  Hip flexion 90 deg, pain  115 but with increased external rotation to avoid the pain  Hip extension     Hip abduction 40 deg, pain  45  Hip adduction     Hip internal rotation To neutral from ER, pain  10  Hip external rotation Tight end range  Tightness at end range   Knee flexion WNL    Knee extension WNL    Ankle dorsiflexion     Ankle plantarflexion     Ankle inversion     Ankle eversion      (Blank rows = not tested)  LOWER EXTREMITY MMT:    MMT Right eval Left eval 7/22  Hip flexion 4- 4+ R 4  Hip extension 4 4+ R 4+  Hip abduction 3+ 4 R 4-  Hip adduction 4- 5   Hip internal rotation 3+ 4+ R 4-  Hip external rotation 3+ 4+ R 4-  Knee flexion 4 4+   Knee extension 4+ 4+   Ankle dorsiflexion     Ankle plantarflexion     Ankle inversion     Ankle eversion      (Blank rows = not tested)    FUNCTIONAL TESTS:  5 times sit to stand: 14.49 no UE assist, anterior hip and thigh pain Timed up and go (TUG): 7.42  7/22: 5X STS 9.62  GAIT: Distance walked:   Assistive device utilized: None Level of assistance: Complete Independence Comments: Rt hip is ER, Pt using hip adductors as flexors, shorter stride length on Rt  TREATMENT DATE:   09/28/2023: Recumbent bike level 3 x6 min with PT present to discuss status and become updated on MD appointment Supine hip IR/ER x10 Supine adductor squeeze with ball between knees 2x10 Supine heel press into red pball for hamstring activation 2x10 Supine bridges with LE on red pball 2x10 Long sitting with straight leg raise up and over small cone x15 bilat Sidelying clamshell with red loop 2x10 bilat Trigger Point Dry Needling Subsequent Treatment: Instructions provided previously at initial dry needling treatment.  Patient Verbal Consent Given: Yes Education Handout Provided: Previously Provided Muscles Treated: bilateral lumbar multifidi, bilateral glut max, bilateral piriformis Electrical Stimulation Performed: No Treatment Response/Outcome: Utilized skilled palpation to identify bony landmarks and trigger points.  Able to illicit twitch response and muscle elongation.  Soft tissue mobilization following to further promote tissue elongation.  Manual Therapy:  instrument assisted manual therapy with Addaday to promote further tissue elongation and decreased pain    09/23/2023: Recumbent bike level 3 x6 min with PT present to discuss status and become updated on MD appointment Supine posterior pelvic tilt 2x10 Supine adductor squeeze with ball between knees 2x10 Supine hip ER fall outs 2x10 Supine heel  press into red pball for hamstring activation 3x10 Supine bridges with LE on red pball 2x10 Sidelying clamshell with red loop 2x10 bilat Sidelying reverse clamshell with red loop 2x10 bilat Quadruped fire hydrants 2x10 bilat Long sitting with straight leg raise up and over small cone x10 bilat   09/01/23 Nustep level 3 for 6 min- PT student present to discuss pt status Supine posterior pelvic tilt  2x10 Supine adductor squeeze with ball between knees 2x10 Supine hip ER fall outs 2x10 Supine heel press into red stability ball for hamstring activation 3x10  Supine glute bridges 3x10 Prone hamstring curls 2x10 Side lying reverse clamshells 2x10 on each side  Hip flexed /leg straight over dumbell back and forth in long sitting 2x10 each side Seated hamstring stretch 2x20sec    PATIENT EDUCATION:  Education details: [email or web address] Person educated: Patient Education method: Explanation, Demonstration, Verbal cues, and Handouts Education comprehension: verbalized understanding and returned demonstration   HOME EXERCISE PROGRAM: Access Code: [email or web address] URL: [email or web address] Date: 07/13/2023 Prepared by: Mliss  Exercises - Hip Flexor Stretch at Edge of Bed  - 2-3 x daily - 7 x weekly - 1 sets - 2 reps - 30 hold - Supine Piriformis Stretch with Leg Straight  - 2-3 x daily - 7 x weekly - 1 sets - 2 reps - 30 hold - Figure 4 Bridge  - 1 x daily - 7 x weekly - 2 sets - 10 reps - Squat with Chair Touch  - 1 x daily - 7 x weekly - 1 sets - 10 reps - Runner's Step Up/Down  - 1 x daily - 7 x weekly - 2 sets - 10 reps - Hip Hiking on Step  - 1 x daily - 7 x weekly - 2 sets - 10 reps - Forward T with Counter Support  - 1 x daily - 7 x weekly - 2 sets - 10 reps - Sidelying Hip Abduction  - 1 x daily - 7 x weekly - 2 sets - 10 reps - Seated Hip Flexor Stretch  - 2 x daily - 7 x weekly - 1 sets - 3 reps - 30-60 sec hold - Psoas Mobilization with Small Ball (Mirrored)  - 1 x daily - 7 x weekly - 1 sets - 1 reps - 30-60 sec hold - Prone Press Up  - 3 x daily - 7 x weekly - 1-3 sets - 10 reps - Kneeling Adductor Stretch with Hip External Rotation  - 1 x daily - 3-4 x weekly - 1 sets - 3 reps - 30-60 sec hold - Prone Alternating Arm and Leg Lifts  - 1 x daily - 3 x weekly - 3 sets - 10 reps - 5 sec hold  ASSESSMENT:  CLINICAL IMPRESSION:  Ms Vanyo presents to skilled PT reporting  that she is having some increased hip pain today and is hoping for some dry needling to assist her prior to going out of town to [place].  Patient is independent with HEP and able to perform prior to planned THA on 10/30/23.  Patient is agreeable to dry needling today to assist with decreased pain.  Patient with strong twitch response noted during dry needling and reports that her muscles feel looser.  Following manual therapy with Addaday, patient reports further decreased pain and by end of session, pain decreased to 3/10.  Patient is discharged from skilled PT at this time to continue with HEP and follow up with Dr Vernetta for planned total hip arthroplasty.  OBJECTIVE IMPAIRMENTS: Abnormal gait, decreased activity tolerance, decreased balance, decreased knowledge of condition, decreased mobility, decreased ROM, decreased strength, hypomobility, increased muscle spasms, impaired flexibility, postural dysfunction, and pain.   ACTIVITY LIMITATIONS: carrying, lifting, bending, sitting, squatting, sleeping, stairs, transfers, and locomotion level  PARTICIPATION LIMITATIONS: cleaning, driving, community activity, occupation, and yard work  PERSONAL FACTORS: 1 comorbidity: falls related to weakness of Rt hip are also affecting patient's functional outcome.   REHAB POTENTIAL: Excellent  CLINICAL DECISION MAKING: Stable/uncomplicated  EVALUATION COMPLEXITY: Low   GOALS: Goals reviewed with patient? Yes  SHORT TERM GOALS: Target date: 07/03/23  Pt will be ind with initial HEP without exacerbation of pain Baseline: Goal status: Met on 09/28/23  2.  Pt will be able to squat and march to climb stairs in sagittal plane with minimal pain Baseline:  Goal status: Met on 09/28/23  3.  Pt will express understanding of movement as medicine and take frequent breaks from static positions to avoid stiffness related pain Baseline:  Goal status: MET    LONG TERM GOALS: Target date: 09/29/2023  Pt  will be ind with advanced HEP and understand importance of compliance upon d/c from PT Baseline:  Goal status: Met on 09/28/23  2.  Pt will improve LEFS by at least 9 points to demo improved function Baseline: 39/80, goal will be met at 48/80 Goal status: Not Met (40/80 on 09/28/2023)  3.  Pt will report pain not to exceed 4/10 with getting in and out of car Baseline: 7/10 Goal status: Not Met (3-6/10 on 09/28/23)  4.  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  Pt will improve Rt hip strength to allow her to step up onto 8 inch step while holding 10lb box to simulate work tasks Baseline:  Goal status: Partially Met  6.  Pt will report pain with daily activities to improve by at least 50% Baseline:  Goal status: Partially Met  PLAN:  PT FREQUENCY: 2x/week  PT DURATION: 8 weeks  PLANNED INTERVENTIONS: 97110-Therapeutic exercises, 97530- Therapeutic activity, 97112- Neuromuscular re-education, 97535- Self Care, 02859- Manual therapy, [phone]- Gait training, [phone]- Aquatic Therapy, [phone]- Electrical stimulation (unattended), [phone]- Ionotophoresis 4mg /ml Dexamethasone , Patient/Family education, Balance training, Stair training, Taping, Dry Needling, Joint mobilization, Spinal mobilization, Cryotherapy, and Moist heat.  PHYSICAL THERAPY DISCHARGE SUMMARY  See above for details during episode of care.  Patient agrees to discharge. Patient goals were partially met. Patient is being discharged due to patient going out of town, then going to have planned THA by Dr Vernetta.    Jarrell Laming, PT, DPT 09/28/23, 1:29 PM  [facility] [address] [place], [place] [address] Phone # [phone] Fax [phone]

## 2023-10-01 ENCOUNTER — Other Ambulatory Visit: Payer: Self-pay | Admitting: Physician Assistant

## 2023-10-01 DIAGNOSIS — Z01818 Encounter for other preprocedural examination: Secondary | ICD-10-CM

## 2023-10-12 ENCOUNTER — Encounter: Payer: Self-pay | Admitting: Family Medicine

## 2023-10-16 NOTE — Telephone Encounter (Signed)
 Appt scheduled w/ [place].

## 2023-10-16 NOTE — Patient Instructions (Addendum)
 SURGICAL WAITING ROOM VISITATION  Patients having surgery or a procedure may have no more than 2 support people in the waiting area - these visitors may rotate.    Children under the age of 34 must have an adult with them who is not the patient.  Visitors with respiratory illnesses are discouraged from visiting and should remain at home.  If the patient needs to stay at the hospital during part of their recovery, the visitor guidelines for inpatient rooms apply. Pre-op nurse will coordinate an appropriate time for 1 support person to accompany patient in pre-op.  This support person may not rotate.    Please refer to the [facility] website for the visitor guidelines for Inpatients (after your surgery is over and you are in a regular room).       Your procedure is scheduled on: 10/17.25   Report to [facility] Main Entrance    Report to admitting at 7:15 AM   Call this number if you have problems the morning of surgery [phone]   Do not eat food :After Midnight.   After Midnight you may have the following liquids until 6:45 AM DAY OF SURGERY  Water Non-Citrus Juices (without pulp, NO RED-Apple, White grape, White cranberry) Black Coffee (NO MILK/CREAM OR CREAMERS, sugar ok)  Clear Tea (NO MILK/CREAM OR CREAMERS, sugar ok) regular and decaf                             Plain Jell-O (NO RED)                                           Fruit ices (not with fruit pulp, NO RED)                                     Popsicles (NO RED)                                                               Sports drinks like Gatorade (NO RED)                  The day of surgery:  Drink ONE (1) Pre-Surgery Clear Ensure at 6:45 AM the morning of surgery. Drink in one sitting. Do not sip.  This drink was given to you during your hospital  pre-op appointment visit. Nothing else to drink after completing the  Pre-Surgery Clear Ensure.       Oral Hygiene is also important to reduce your  risk of infection.                                    Remember - BRUSH YOUR TEETH THE MORNING OF SURGERY WITH YOUR REGULAR TOOTHPASTE   Stop all vitamins and herbal supplements 7 days before surgery.   Take these medicines the morning of surgery with A SIP OF WATER: omeprazole , effexor .             You may not have  any metal on your body including hair pins, jewelry, and body piercing             Do not wear make-up, lotions, powders, perfumes/cologne, or deodorant  Do not wear nail polish including gel and S&S, artificial/acrylic nails, or any other type of covering on natural nails including finger and toenails. If you have artificial nails, gel coating, etc. that needs to be removed by a nail salon please have this removed prior to surgery or surgery may need to be canceled/ delayed if the surgeon/ anesthesia feels like they are unable to be safely monitored.   Do not shave  48 hours prior to surgery.    Do not bring valuables to the hospital. [facility] IS NOT             RESPONSIBLE   FOR VALUABLES.   Contacts, glasses, dentures or bridgework may not be worn into surgery.   Bring small overnight bag day of surgery.   DO NOT BRING YOUR HOME MEDICATIONS TO THE HOSPITAL. PHARMACY WILL DISPENSE MEDICATIONS LISTED ON YOUR MEDICATION LIST TO YOU DURING YOUR ADMISSION IN THE HOSPITAL!    Patients discharged on the day of surgery will not be allowed to drive home.  Someone NEEDS to stay with you for the first 24 hours after anesthesia.   Special Instructions: Bring a copy of your healthcare power of attorney and living will documents the day of surgery if you haven't scanned them before.              Please read over the following fact sheets you were given: IF YOU HAVE QUESTIONS ABOUT YOUR PRE-OP INSTRUCTIONS PLEASE CALL [phone] [place]   If you received a COVID test during your pre-op visit  it is requested that you wear a mask when out in public, stay away from anyone that may not be  feeling well and notify your surgeon if you develop symptoms. If you test positive for Covid or have been in contact with anyone that has tested positive in the last 10 days please notify you surgeon.      Pre-operative 4 CHG Bath Instructions  DYNA-Hex 4 Chlorhexidine  Gluconate 4% Solution Antiseptic 4 fl. oz   You can play a key role in reducing the risk of infection after surgery. Your skin needs to be as free of germs as possible. You can reduce the number of germs on your skin by washing with CHG (chlorhexidine  gluconate) soap before surgery. CHG is an antiseptic soap that kills germs and continues to kill germs even after washing.   DO NOT use if you have an allergy to chlorhexidine /CHG or antibacterial soaps. If your skin becomes reddened or irritated, stop using the CHG and notify one of our RNs at   Please shower with the CHG soap starting 4 days before surgery using the following schedule:     Please keep in mind the following:  DO NOT shave, including legs and underarms, starting the day of your first shower.   You may shave your face at any point before/day of surgery.  Place clean sheets on your bed the day you start using CHG soap. Use a clean washcloth (not used since being washed) for each shower. DO NOT sleep with pets once you start using the CHG.  CHG Shower Instructions:  If you choose to wash your hair and private area, wash first with your normal shampoo/soap.  After you use shampoo/soap, rinse your hair and body thoroughly to remove  shampoo/soap residue.  Turn the water OFF and apply about 3 tablespoons (45 ml) of CHG soap to a CLEAN washcloth.  Apply CHG soap ONLY FROM YOUR NECK DOWN TO YOUR TOES (washing for 3-5 minutes)  DO NOT use CHG soap on face, private areas, open wounds, or sores.  Pay special attention to the area where your surgery is being performed.  If you are having back surgery, having someone wash your back for you may be helpful. Wait 2 minutes  after CHG soap is applied, then you may rinse off the CHG soap.  Pat dry with a clean towel  Put on clean clothes/pajamas   If you choose to wear lotion, please use ONLY the CHG-compatible lotions on the back of this paper.     Additional instructions for the day of surgery: DO NOT APPLY any lotions, deodorants, cologne, or perfumes.   Put on clean/comfortable clothes.  Brush your teeth.  Ask your nurse before applying any prescription medications to the skin.   CHG Compatible Lotions   Aveeno Moisturizing lotion  Cetaphil Moisturizing Cream  Cetaphil Moisturizing Lotion  Clairol Herbal Essence Moisturizing Lotion, Dry Skin  Clairol Herbal Essence Moisturizing Lotion, Extra Dry Skin  Clairol Herbal Essence Moisturizing Lotion, Normal Skin  Curel Age Defying Therapeutic Moisturizing Lotion with Alpha Hydroxy  Curel Extreme Care Body Lotion  Curel Soothing Hands Moisturizing Hand Lotion  Curel Therapeutic Moisturizing Cream, Fragrance-Free  Curel Therapeutic Moisturizing Lotion, Fragrance-Free  Curel Therapeutic Moisturizing Lotion, Original Formula  Eucerin Daily Replenishing Lotion  Eucerin Dry Skin Therapy Plus Alpha Hydroxy Crme  Eucerin Dry Skin Therapy Plus Alpha Hydroxy Lotion  Eucerin Original Crme  Eucerin Original Lotion  Eucerin Plus Crme Eucerin Plus Lotion  Eucerin TriLipid Replenishing Lotion  Keri Anti-Bacterial Hand Lotion  Keri Deep Conditioning Original Lotion Dry Skin Formula Softly Scented  Keri Deep Conditioning Original Lotion, Fragrance Free Sensitive Skin Formula  Keri Lotion Fast Absorbing Fragrance Free Sensitive Skin Formula  Keri Lotion Fast Absorbing Softly Scented Dry Skin Formula  Keri Original Lotion  Keri Skin Renewal Lotion Keri Silky Smooth Lotion  Keri Silky Smooth Sensitive Skin Lotion  Nivea Body Creamy Conditioning Oil  Nivea Body Extra Enriched Lotion  Nivea Body Original Lotion  Nivea Body Sheer Moisturizing Lotion Nivea Crme   Nivea Skin Firming Lotion  NutraDerm 30 Skin Lotion  NutraDerm Skin Lotion  NutraDerm Therapeutic Skin Cream  NutraDerm Therapeutic Skin Lotion  ProShield Protective Hand Cream  Incentive Spirometer  An incentive spirometer is a tool that can help keep your lungs clear and active. This tool measures how well you are filling your lungs with each breath. Taking long deep breaths may help reverse or decrease the chance of developing breathing (pulmonary) problems (especially infection) following: A long period of time when you are unable to move or be active. BEFORE THE PROCEDURE  If the spirometer includes an indicator to show your best effort, your nurse or respiratory therapist will set it to a desired goal. If possible, sit up straight or lean slightly forward. Try not to slouch. Hold the incentive spirometer in an upright position. INSTRUCTIONS FOR USE  Sit on the edge of your bed if possible, or sit up as far as you can in bed or on a chair. Hold the incentive spirometer in an upright position. Breathe out normally. Place the mouthpiece in your mouth and seal your lips tightly around it. Breathe in slowly and as deeply as possible, raising the piston or the ball  toward the top of the column. Hold your breath for 3-5 seconds or for as long as possible. Allow the piston or ball to fall to the bottom of the column. Remove the mouthpiece from your mouth and breathe out normally. Rest for a few seconds and repeat Steps 1 through 7 at least 10 times every 1-2 hours when you are awake. Take your time and take a few normal breaths between deep breaths. The spirometer may include an indicator to show your best effort. Use the indicator as a goal to work toward during each repetition. After each set of 10 deep breaths, practice coughing to be sure your lungs are clear. If you have an incision (the cut made at the time of surgery), support your incision when coughing by placing a pillow or rolled up  towels firmly against it. Once you are able to get out of bed, walk around indoors and cough well. You may stop using the incentive spirometer when instructed by your caregiver.  RISKS AND COMPLICATIONS Take your time so you do not get dizzy or light-headed. If you are in pain, you may need to take or ask for pain medication before doing incentive spirometry. It is harder to take a deep breath if you are having pain. AFTER USE Rest and breathe slowly and easily. It can be helpful to keep track of a log of your progress. Your caregiver can provide you with a simple table to help with this. If you are using the spirometer at home, follow these instructions: SEEK MEDICAL CARE IF:  You are having difficultly using the spirometer. You have trouble using the spirometer as often as instructed. Your pain medication is not giving enough relief while using the spirometer. You develop fever of 100.5 F (38.1 C) or higher. SEEK IMMEDIATE MEDICAL CARE IF:  You cough up bloody sputum that had not been present before. You develop fever of 102 F (38.9 C) or greater. You develop worsening pain at or near the incision site. MAKE SURE YOU:  Understand these instructions. Will watch your condition. Will get help right away if you are not doing well or get worse. Document Released: 05/12/2006 Document Revised: 03/24/2011 Document Reviewed: 07/13/2006  . WHAT IS A BLOOD TRANSFUSION? Blood Transfusion Information  A transfusion is the replacement of blood or some of its parts. Blood is made up of multiple cells which provide different functions. Red blood cells carry oxygen and are used for blood loss replacement. White blood cells fight against infection. Platelets control bleeding. Plasma helps clot blood. Other blood products are available for specialized needs, such as hemophilia or other clotting disorders. BEFORE THE TRANSFUSION  Who gives blood for transfusions?  Healthy volunteers who are  fully evaluated to make sure their blood is safe. This is blood bank blood. Transfusion therapy is the safest it has ever been in the practice of medicine. Before blood is taken from a donor, a complete history is taken to make sure that person has no history of diseases nor engages in risky social behavior (examples are intravenous drug use or sexual activity with multiple partners). The donor's travel history is screened to minimize risk of transmitting infections, such as malaria. The donated blood is tested for signs of infectious diseases, such as HIV and hepatitis. The blood is then tested to be sure it is compatible with you in order to minimize the chance of a transfusion reaction. If you or a relative donates blood, this is often done in anticipation  of surgery and is not appropriate for emergency situations. It takes many days to process the donated blood. RISKS AND COMPLICATIONS Although transfusion therapy is very safe and saves many lives, the main dangers of transfusion include:  Getting an infectious disease. Developing a transfusion reaction. This is an allergic reaction to something in the blood you were given. Every precaution is taken to prevent this. The decision to have a blood transfusion has been considered carefully by your caregiver before blood is given. Blood is not given unless the benefits outweigh the risks. AFTER THE TRANSFUSION Right after receiving a blood transfusion, you will usually feel much better and more energetic. This is especially true if your red blood cells have gotten low (anemic). The transfusion raises the level of the red blood cells which carry oxygen, and this usually causes an energy increase. The nurse administering the transfusion will monitor you carefully for complications. HOME CARE INSTRUCTIONS  No special instructions are needed after a transfusion. You may find your energy is better. Speak with your caregiver about any limitations on activity for  underlying diseases you may have. SEEK MEDICAL CARE IF:  Your condition is not improving after your transfusion. You develop redness or irritation at the intravenous (IV) site. SEEK IMMEDIATE MEDICAL CARE IF:  Any of the following symptoms occur over the next 12 hours: Shaking chills. You have a temperature by mouth above 102 F (38.9 C), not controlled by medicine. Chest, back, or muscle pain. People around you feel you are not acting correctly or are confused. Shortness of breath or difficulty breathing. Dizziness and fainting. You get a rash or develop hives. You have a decrease in urine output. Your urine turns a dark color or changes to pink, red, or brown. Any of the following symptoms occur over the next 10 days: You have a temperature by mouth above 102 F (38.9 C), not controlled by medicine. Shortness of breath. Weakness after normal activity. The white part of the eye turns yellow (jaundice). You have a decrease in the amount of urine or are urinating less often. Your urine turns a dark color or changes to pink, red, or brown. Document Released: 12/28/1999 Document Revised: 03/24/2011 Document Reviewed: 08/16/2007 [facility] Patient Information 2014 [place], [place].

## 2023-10-16 NOTE — Progress Notes (Signed)
 COVID Vaccine received:  []  No [x]  Yes Date of any COVID positive Test in last 90 days: NO PCP - Harlene Horton MD Cardiologist - N/A  Chest x-ray -  EKG -   Stress Test -  ECHO -  Cardiac Cath -   Bowel Prep - [x]  No  []   Yes ______  Pacemaker / ICD device [x]  No []  Yes   Spinal Cord Stimulator:[x]  No []  Yes       History of Sleep Apnea? [x]  No []  Yes   CPAP used?- [x]  No []  Yes    Does the patient monitor blood sugar?          [x]  No []  Yes  []  N/A  Patient has: [x]  NO Hx DM   []  Pre-DM                 []  DM1  []   DM2 Does patient have a [employer] or Dexacom? []  No []  Yes   Fasting Blood Sugar Ranges-  Checks Blood Sugar _____ times a day  GLP1 agonist / usual dose - NO GLP1 instructions:  SGLT-2 inhibitors / usual dose - NO SGLT-2 instructions:   Blood Thinner / Instructions:no Aspirin Instructions:no  Comments:   Activity level: Patient is able to climb a flight of stairs without difficulty; [x]  No CP  [x]  No SOB,    Patient can perform ADLs without assistance.   Anesthesia review:   Patient denies shortness of breath, fever, cough and chest pain at PAT appointment.  Patient verbalized understanding and agreement to the Pre-Surgical Instructions that were given to them at this PAT appointment. Patient was also educated of the need to review these PAT instructions again prior to his/her surgery.I reviewed the appropriate phone numbers to call if they have any and questions or concerns.

## 2023-10-19 ENCOUNTER — Encounter (HOSPITAL_COMMUNITY)
Admission: RE | Admit: 2023-10-19 | Discharge: 2023-10-19 | Disposition: A | Source: Ambulatory Visit | Attending: Orthopaedic Surgery

## 2023-10-19 ENCOUNTER — Encounter (HOSPITAL_COMMUNITY): Payer: Self-pay

## 2023-10-19 ENCOUNTER — Other Ambulatory Visit: Payer: Self-pay

## 2023-10-19 VITALS — BP 131/83 | HR 74 | Temp 98.0°F | Resp 16 | Ht 64.0 in | Wt 118.0 lb

## 2023-10-19 DIAGNOSIS — Z01818 Encounter for other preprocedural examination: Secondary | ICD-10-CM | POA: Insufficient documentation

## 2023-10-19 HISTORY — DX: Unspecified osteoarthritis, unspecified site: M19.90

## 2023-10-19 LAB — CBC
HCT: 39.5 % (ref 36.0–46.0)
Hemoglobin: 11.9 g/dL — ABNORMAL LOW (ref 12.0–15.0)
MCH: 27.8 pg (ref 26.0–34.0)
MCHC: 30.1 g/dL (ref 30.0–36.0)
MCV: 92.3 fL (ref 80.0–100.0)
Platelets: 519 K/uL — ABNORMAL HIGH (ref 150–400)
RBC: 4.28 MIL/uL (ref 3.87–5.11)
RDW: 14.5 % (ref 11.5–15.5)
WBC: 7.4 K/uL (ref 4.0–10.5)
nRBC: 0 % (ref 0.0–0.2)

## 2023-10-19 LAB — BASIC METABOLIC PANEL WITH GFR
Anion gap: 9 (ref 5–15)
BUN: 16 mg/dL (ref 8–23)
CO2: 29 mmol/L (ref 22–32)
Calcium: 9.8 mg/dL (ref 8.9–10.3)
Chloride: 102 mmol/L (ref 98–111)
Creatinine, Ser: 0.71 mg/dL (ref 0.44–1.00)
GFR, Estimated: >60 mL/min (ref >60)
Glucose, Bld: 99 mg/dL (ref 70–99)
Potassium: 5 mmol/L (ref 3.5–5.1)
Sodium: 141 mmol/L (ref 135–145)

## 2023-10-19 LAB — TYPE AND SCREEN
ABO/RH(D): B POS
Antibody Screen: NEGATIVE

## 2023-10-19 LAB — SURGICAL PCR SCREEN
MRSA, PCR: NEGATIVE
Staphylococcus aureus: NEGATIVE

## 2023-10-20 NOTE — Progress Notes (Addendum)
 Subjective:     Patient ID: Lauren [name], female    DOB: 07-Nov-1955, 68 y.o.   MRN: [id number]  Chief Complaint  Patient presents with   Discuss HRT    Wants to discuss starting HRT    HPI  Discussed the use of AI scribe software for clinical note transcription with the patient, who gave verbal consent to proceed.  Patient scheduled for total hip arthroplasty 10/30/2023 History of Present Illness Lauren [name] is a 68 year old female who presents for evaluation of hormone replacement therapy options and upcoming hip replacement surgery.  She is scheduled for hip replacement surgery next Friday due to 'bone on bone' contact in her hip, confirmed by imaging. She experienced multiple falls earlier this year, leading to an orthopedic evaluation. Despite the imaging findings, she does not experience significant pain.  She presents seeking evaluation for hormone replacement therapy due to low libido, vaginal dryness, fatigue, and difficulty sleeping. She is concerned about fatigue, despite her having an active lifestyle. She underwent a hysterectomy at age 24 and has previously avoided estrogen therapy because of a family history of breast cancer. However, given her current symptoms, she feels estrogen therapy may provide relief and would like to check hormone levels and discuss HRT options. She has osteopenia, has not had a recent DEXA scan. She engages in weightlifting.  She has iron deficiency anemia with a slightly abnormal hemoglobin level in recent blood work. She took iron supplements during pregnancy but not recently.   She experiences nocturnal leg and foot cramps, managed with magnesium  supplements and home remedies like pickle juice and mustard. She is not currently on medication for cholesterol after discontinuing a statin due to joint pain.  Patient denies fever, chills, SOB, CP, palpitations, dyspnea, edema, HA, vision changes, N/V/D, abdominal pain, urinary symptoms,  rash, weight changes, and recent illness or hospitalizations.     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   Chicken pox as a child   Depression    been on antidepressants on and off for 20 yr   GERD (gastroesophageal reflux disease)    silent with hiatal hernia   Hiatal hernia 05/22/2013   Hiatal hernia with gastroesophageal reflux 05/22/2013   Hot tub folliculitis 12/18/2013   Hyperglycemia 05/19/2016   Hyperlipidemia 05/19/2016   Measles as a child   Osteopenia after menopause 09/23/2022   DEXA   Postmenopausal atrophic vaginitis 05/22/2013   Preventative health care 01/19/2015   Shingles 68 yrs old   Unspecified vitamin D  deficiency 05/22/2013    Past Surgical History:  Procedure Laterality Date   ABDOMINAL HYSTERECTOMY  1998   partial-still has ovaries   BILIARY STENT PLACEMENT N/A 11/23/2019   Procedure: BILIARY STENT PLACEMENT;  Surgeon: Rollin Dover, MD;  Location: [facility];  Service: Endoscopy;  Laterality: N/A;   CHOLECYSTECTOMY N/A 01/27/2023   Procedure: OPEN CHOLECYSTECTOMY, DRAINAGE OF INTRA-ABDOMINAL ABSCESS;  Surgeon: Ebbie Cough, MD;  Location: [facility];  Service: General;  Laterality: N/A;   ENDOSCOPIC RETROGRADE CHOLANGIOPANCREATOGRAPHY (ERCP) WITH PROPOFOL  N/A 06/01/2020   Procedure: ENDOSCOPIC RETROGRADE CHOLANGIOPANCREATOGRAPHY (ERCP) WITH PROPOFOL ;  Surgeon: Rollin Dover, MD;  [facility]: [facility];  Service: Endoscopy;  Laterality: N/A;  ERCP N/A 11/23/2019   Procedure: ENDOSCOPIC RETROGRADE CHOLANGIOPANCREATOGRAPHY (ERCP);  Surgeon: Rollin Dover, MD;  Location: [facility];  Service: Endoscopy;  Laterality: N/A;   ESOPHAGECTOMY  05-2008   INDUCED ABORTION  1979   REMOVAL OF STONES  11/23/2019   Procedure: REMOVAL OF STONES;  Surgeon:  Rollin Dover, MD;  [facility]: [facility];  Service: Endoscopy;;   SPHINCTEROTOMY  11/23/2019   Procedure: ANNETT;  Surgeon: Rollin Dover, MD;  [facility]: [facility];  Service: Endoscopy;;   STENT REMOVAL  06/01/2020   Procedure: STENT REMOVAL;  Surgeon: Rollin Dover, MD;  [facility]: [facility];  Service: Endoscopy;;    Family History  Problem Relation Age of Onset   Cancer Mother 26       breast cancer   Emphysema Mother        smoker   Heart attack Father        X several   Mental illness Brother        bipolar, xanax and thc abuse   Obesity Brother    Depression Daughter    Anxiety disorder Daughter    Other Maternal Grandmother        blood clot, dvt, lung   Cancer Paternal Grandmother        stomach   Heart disease Paternal Grandfather     Social History   Socioeconomic History   Marital status: Married    Spouse name: Signe   Number of children: Not on file   Years of education: Not on file   Highest education level: Bachelor's degree (e.g., BA, AB, BS)  Occupational History    Employer: [employer]    Comment: Event Planner  Tobacco Use   Smoking status: Former    Current packs/day: 0.10    Average packs/day: 0.1 packs/day for 25.9 years (2.6 ttl pk-yrs)    Types: Cigarettes    Start date: 01/13/1998   Smokeless tobacco: Never   Tobacco comments:    1-2 cigarettes a day  Vaping Use   Vaping status: Never Used  Substance and Sexual Activity   Alcohol use: Yes    Comment: a couple glasses of wine every other day   Drug use: No   Sexual activity: Yes    Partners: Male    Comment: lives with husband, event planner, no dietary restrictions  Other Topics Concern   Not on file  Social History Narrative   Married, husband Pharmacist, hospital   Social Drivers of Health   Financial Resource Strain: Low Risk  (06/25/2023)   Overall Financial Resource Strain (CARDIA)    Difficulty of Paying Living Expenses: Not hard at all  Food  Insecurity: No Food Insecurity (06/25/2023)   Hunger Vital Sign    Worried About Running Out of Food in the Last Year: Never true    Ran Out of Food in the Last Year: Never true  Transportation Needs: No Transportation Needs (06/25/2023)   PRAPARE - Administrator, Civil Service (Medical): No    Lack of Transportation (Non-Medical): No  Physical Activity: Insufficiently Active (06/25/2023)   Exercise Vital Sign    Days of Exercise per Week: 3 days    Minutes of Exercise per Session: 20 min  Stress: No Stress Concern Present (06/25/2023)   [employer] of Occupational Health - Occupational Stress Questionnaire    Feeling of Stress: Only a little  Social Connections: Socially Integrated (06/25/2023)   Social Connection and Isolation Panel    Frequency  of Communication with Friends and Family: More than three times a week    Frequency of Social Gatherings with Friends and Family: More than three times a week    Attends Religious Services: More than 4 times per year    Active Member of [employer] or Organizations: Yes    Attends Engineer, structural: More than 4 times per year    Marital Status: Married  Catering manager Violence: Not At Risk (06/25/2023)   Humiliation, Afraid, Rape, and Kick questionnaire    Fear of Current or Ex-Partner: No    Emotionally Abused: No    Physically Abused: No    Sexually Abused: No    Outpatient Medications Prior to Visit  Medication Sig Dispense Refill   ascorbic acid (VITAMIN C) 500 MG tablet Take 500 mg by mouth daily.     Cholecalciferol (VITAMIN D ) 50 MCG (2000 UT) CAPS Take 2,000 Units by mouth daily. (Patient taking differently: Take 4,000 Units by mouth daily.)     Collagen-Vitamin C-Biotin (COLLAGEN PO) Take 1 Scoop by mouth daily.     magnesium  oxide (MAG-OX) 400 (240 Mg) MG tablet Take 400 mg by mouth daily.     omeprazole  (PRILOSEC) 20 MG capsule Take 1 capsule (20 mg total) by mouth daily. 90 capsule 1   venlafaxine  XR  (EFFEXOR -XR) 150 MG 24 hr capsule TAKE 1 CAPSULE DAILY WITH BREAKFAST. TAKE WITH 75 MG EFFEXOR  XR FOR TOTAL DOSE OF 225 MG. 90 capsule 1   meloxicam  (MOBIC ) 7.5 MG tablet Take 1 tablet (7.5 mg total) by mouth daily as needed for pain. (Patient not taking: Reported on 10/22/2023) 30 tablet 0   venlafaxine  XR (EFFEXOR -XR) 75 MG 24 hr capsule TAKE 1 CAPSULE DAILY WITH BREAKFAST. TAKE WITH 150 MG EFFEXOR  XR TOTAL DOSE OF 225 MG 90 capsule 1   No facility-administered medications prior to visit.    No Known Allergies  ROS See HPI    Objective:    Physical Exam  General: No acute distress. Awake and conversant.  Eyes: Normal conjunctiva, anicteric. Round symmetric pupils.  ENT: Hearing grossly intact. No nasal discharge.  Neck: Neck is supple. No masses or thyromegaly.  Respiratory: CTAB. Respirations are non-labored. No wheezing.  Skin: Warm. No rashes or ulcers.  Psych: Alert and oriented. Cooperative, Appropriate mood and affect, Normal judgment.  CV: RRR. No murmur. No lower extremity edema.  MSK: Normal ambulation. No clubbing or cyanosis.  Neuro:  CN II-XII grossly normal.    BP 122/77   Pulse 76   Temp 98 F (36.7 C) (Oral)   Resp 16   Ht 5' 4 (1.626 m)   Wt 122 lb (55.3 kg)   SpO2 99%   BMI 20.94 kg/m  Wt Readings from Last 3 Encounters:  10/22/23 122 lb (55.3 kg)  10/19/23 118 lb (53.5 kg)  08/12/23 122 lb (55.3 kg)       Assessment & Plan:   Problem List Items Addressed This Visit     Hyperglycemia   Relevant Orders   HgB A1c (Completed)   Comp Met (CMET) (Completed)   Hyperlipidemia   Relevant Orders   Comp Met (CMET) (Completed)   Lipid panel (Completed)   Vaginal dryness   Postmenopausal state with estrogen deficiency symptoms - Prescribe estradiol  cream - Refer to gynecology for further HRT management. -Discusses alternative clinics- [place], [place] information provided in AVS - Order hormone panel.      Relevant Medications   conjugated  estrogens  (PREMARIN ) vaginal  cream   Vitamin D  deficiency   Relevant Orders   Vitamin D  (25 hydroxy) (Completed)   Other Visit Diagnoses       Need for postmenopausal hormone replacement    -  Primary   Relevant Orders   Ambulatory referral to Gynecology     Iron deficiency anemia, unspecified iron deficiency anemia type       Relevant Orders   CBC with Differential/Platelet (Completed)   Iron, TIBC and Ferritin Panel     Postmenopausal estrogen deficiency       Relevant Medications   conjugated estrogens  (PREMARIN ) vaginal cream   Other Relevant Orders   TSH (Completed)   FSH/LH (Completed)   Hormone Panel   Testosterone  (Completed)   DHEA-sulfate (Completed)     Fatigue, unspecified type       Relevant Orders   CBC with Differential/Platelet (Completed)   Hormone Panel   Testosterone  (Completed)   DHEA-sulfate (Completed)   Iron, TIBC and Ferritin Panel     Osteopenia after menopause           Osteoarthritis of left hip Severe osteoarthritis confirmed by MRI. Limited relief from physical therapy and cortisone injection. Scheduled for hip replacement 10/17 Advise HRT Tx after this   Osteopenia . Encouraged weight-bearing exercises and adequate vitamin D  and calcium  intake. - Plan to repeat DEXA scan next year.  Vitamin D  deficiency Supplement and Monitor  Iron deficiency anemia - Order CBC and iron panel. - Consider multivitamin with iron if anemia persists.  Hyperlipidemia Hyperlipidemia with previous statin use discontinued due to joint pain. Recheck lipid levels. - Order lipid panel.  The 10-year ASCVD risk score (Arnett DK, et al., 2019) is: 8.7%   Values used to calculate the score:     Age: 58 years     Clincally relevant sex: Female     Is Non-Hispanic African American: No     Diabetic: No     Tobacco smoker: No     Systolic Blood Pressure: 122 mmHg     Is BP treated: No     HDL Cholesterol: 72.1 mg/dL     Total Cholesterol: 236 mg/dL   I am  having Keirstan J. Tennant start on conjugated  estrogens . I am also having her maintain her Vitamin D , omeprazole , meloxicam , venlafaxine  XR, ascorbic acid, magnesium  oxide, and Collagen-Vitamin C-Biotin (COLLAGEN PO).  Meds ordered this encounter  Medications   conjugated estrogens  (PREMARIN ) vaginal cream    Sig: Place 0.75 Applicatorfuls vaginally daily.    Dispense:  42.5 g    Refill:  6    Supervising Provider:   DOMENICA BLACKBIRD A [[id number]]

## 2023-10-22 ENCOUNTER — Encounter: Payer: Self-pay | Admitting: Student

## 2023-10-22 ENCOUNTER — Ambulatory Visit: Admitting: Student

## 2023-10-22 VITALS — BP 122/77 | HR 76 | Temp 98.0°F | Resp 16 | Ht 64.0 in | Wt 122.0 lb

## 2023-10-22 DIAGNOSIS — R5383 Other fatigue: Secondary | ICD-10-CM | POA: Diagnosis not present

## 2023-10-22 DIAGNOSIS — M8580 Other specified disorders of bone density and structure, unspecified site: Secondary | ICD-10-CM | POA: Diagnosis not present

## 2023-10-22 DIAGNOSIS — D509 Iron deficiency anemia, unspecified: Secondary | ICD-10-CM

## 2023-10-22 DIAGNOSIS — Z780 Asymptomatic menopausal state: Secondary | ICD-10-CM

## 2023-10-22 DIAGNOSIS — E785 Hyperlipidemia, unspecified: Secondary | ICD-10-CM

## 2023-10-22 DIAGNOSIS — R739 Hyperglycemia, unspecified: Secondary | ICD-10-CM

## 2023-10-22 DIAGNOSIS — E559 Vitamin D deficiency, unspecified: Secondary | ICD-10-CM

## 2023-10-22 DIAGNOSIS — N898 Other specified noninflammatory disorders of vagina: Secondary | ICD-10-CM

## 2023-10-22 DIAGNOSIS — Z79890 Hormone replacement therapy (postmenopausal): Secondary | ICD-10-CM | POA: Diagnosis not present

## 2023-10-22 LAB — COMPREHENSIVE METABOLIC PANEL WITH GFR
ALT: 16 U/L (ref 0–35)
AST: 21 U/L (ref 0–37)
Albumin: 4.4 g/dL (ref 3.5–5.2)
Alkaline Phosphatase: 99 U/L (ref 39–117)
BUN: 14 mg/dL (ref 6–23)
CO2: 34 meq/L — ABNORMAL HIGH (ref 19–32)
Calcium: 9.7 mg/dL (ref 8.4–10.5)
Chloride: 96 meq/L (ref 96–112)
Creatinine, Ser: 0.69 mg/dL (ref 0.40–1.20)
GFR: 87.76 mL/min (ref >60.00)
Glucose, Bld: 88 mg/dL (ref 70–99)
Potassium: 4.7 meq/L (ref 3.5–5.1)
Sodium: 137 meq/L (ref 135–145)
Total Bilirubin: 0.3 mg/dL (ref 0.2–1.2)
Total Protein: 7.4 g/dL (ref 6.0–8.3)

## 2023-10-22 LAB — LIPID PANEL
Cholesterol: 236 mg/dL — ABNORMAL HIGH (ref 0–200)
HDL: 72.1 mg/dL (ref >39.00)
LDL Cholesterol: 148 mg/dL — ABNORMAL HIGH (ref 0–99)
NonHDL: 163.6
Total CHOL/HDL Ratio: 3
Triglycerides: 76 mg/dL (ref 0.0–149.0)
VLDL: 15.2 mg/dL (ref 0.0–40.0)

## 2023-10-22 LAB — VITAMIN D 25 HYDROXY (VIT D DEFICIENCY, FRACTURES): VITD: 48.4 ng/mL (ref 30.00–100.00)

## 2023-10-22 LAB — CBC WITH DIFFERENTIAL/PLATELET
Basophils Absolute: 0 K/uL (ref 0.0–0.1)
Basophils Relative: 0.8 % (ref 0.0–3.0)
Eosinophils Absolute: 0.1 K/uL (ref 0.0–0.7)
Eosinophils Relative: 1.9 % (ref 0.0–5.0)
HCT: 37.8 % (ref 36.0–46.0)
Hemoglobin: 12.2 g/dL (ref 12.0–15.0)
Lymphocytes Relative: 24.3 % (ref 12.0–46.0)
Lymphs Abs: 1.3 K/uL (ref 0.7–4.0)
MCHC: 32.4 g/dL (ref 30.0–36.0)
MCV: 89.8 fl (ref 78.0–100.0)
Monocytes Absolute: 0.4 K/uL (ref 0.1–1.0)
Monocytes Relative: 7.7 % (ref 3.0–12.0)
Neutro Abs: 3.5 K/uL (ref 1.4–7.7)
Neutrophils Relative %: 65.3 % (ref 43.0–77.0)
Platelets: 532 K/uL — ABNORMAL HIGH (ref 150.0–400.0)
RBC: 4.2 Mil/uL (ref 3.87–5.11)
RDW: 14.6 % (ref 11.5–15.5)
WBC: 5.4 K/uL (ref 4.0–10.5)

## 2023-10-22 LAB — TSH: TSH: 1.65 u[IU]/mL (ref 0.35–5.50)

## 2023-10-22 LAB — TESTOSTERONE: Testosterone: 13.53 ng/dL — ABNORMAL LOW (ref 15.00–40.00)

## 2023-10-22 LAB — HEMOGLOBIN A1C: Hgb A1c MFr Bld: 6.6 % — ABNORMAL HIGH (ref 4.6–6.5)

## 2023-10-22 MED ORDER — ESTROGENS CONJUGATED 0.625 MG/GM VA CREA: 0.7500 | TOPICAL_CREAM | Freq: Every day | VAGINAL | 6 refills | Status: DC

## 2023-10-22 NOTE — Patient Instructions (Addendum)
 Paris Harvest MD [facility] Address: [address] [place], [place], [place] [address] Phone: [phone]

## 2023-10-23 ENCOUNTER — Ambulatory Visit: Payer: Self-pay | Admitting: Student

## 2023-10-23 DIAGNOSIS — N898 Other specified noninflammatory disorders of vagina: Secondary | ICD-10-CM

## 2023-10-23 DIAGNOSIS — Z780 Asymptomatic menopausal state: Secondary | ICD-10-CM

## 2023-10-23 LAB — DHEA-SULFATE: DHEA-SO4: 60 ug/dL (ref 9–118)

## 2023-10-23 LAB — FSH/LH
FSH: 141.1 m[IU]/mL — ABNORMAL HIGH
LH: 51.8 m[IU]/mL

## 2023-10-24 DIAGNOSIS — N898 Other specified noninflammatory disorders of vagina: Secondary | ICD-10-CM | POA: Insufficient documentation

## 2023-10-24 NOTE — Assessment & Plan Note (Signed)
 Postmenopausal state with estrogen deficiency symptoms - Prescribe estradiol  cream - Refer to gynecology for further HRT management. -Discusses alternative clinics- [place], [place] information provided in AVS - Order hormone panel.

## 2023-10-26 DIAGNOSIS — S0502XA Injury of conjunctiva and corneal abrasion without foreign body, left eye, initial encounter: Secondary | ICD-10-CM | POA: Diagnosis not present

## 2023-10-29 ENCOUNTER — Telehealth: Payer: Self-pay | Admitting: *Deleted

## 2023-10-29 DIAGNOSIS — S0502XD Injury of conjunctiva and corneal abrasion without foreign body, left eye, subsequent encounter: Secondary | ICD-10-CM | POA: Diagnosis not present

## 2023-10-29 NOTE — H&P (Signed)
 TOTAL HIP ADMISSION H&P  Patient is admitted for right total hip arthroplasty.  Subjective:  Chief Complaint: right hip pain  HPI: Lauren [name], 68 y.o. female, has a history of pain and functional disability in the right hip(s) due to arthritis and patient has failed non-surgical conservative treatments for greater than 12 weeks to include NSAID's and/or analgesics, corticosteriod injections, and activity modification.  Onset of symptoms was abrupt starting earlier thus year after a hard fall with gradually worsening course since that time.The patient noted no past surgery on the right hip(s).  Patient currently rates pain in the right hip at 10 out of 10 with activity. Patient has night pain, worsening of pain with activity and weight bearing, trendelenberg gait, pain that interfers with activities of daily living, and pain with passive range of motion. Patient has evidence of subchondral cysts, subchondral sclerosis, periarticular osteophytes, and joint space narrowing by imaging studies. This condition presents safety issues increasing the risk of falls.  There is no current active infection.  Patient Active Problem List   Diagnosis Date Noted   Vaginal dryness 10/24/2023   Unilateral primary osteoarthritis, right hip 06/04/2023   Protein-calorie malnutrition, severe 01/29/2023   Perforated bowel (HCC) 01/26/2023   Perforated gallbladder 01/26/2023   Dysphagia 10/17/2022   Esophageal stricture 10/17/2022   History of malignant neoplasm of esophagus 10/17/2022   Leg pain 06/25/2022   Elevated liver enzymes 07/25/2021   Memory changes 10/15/2020   Ulnar neuropathy 12/27/2019   Abnormal LFTs    Choledocholithiasis with obstruction 11/22/2019   Mass of skin of left thumb 08/31/2019   Mucoid cyst, joint 08/31/2019   Osteoarthritis of finger of left hand 08/31/2019   Hyperglycemia 05/19/2016   Hyperlipidemia 05/19/2016   Hiatal hernia with gastroesophageal reflux 05/22/2013    Postmenopausal atrophic vaginitis 05/22/2013   Vitamin D  deficiency 05/22/2013   Chicken pox    History of shingles    Measles    Cancer (HCC)    Depression    Past Medical History:  Diagnosis Date   Arthritis    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   Chicken pox as a child   Depression    been on antidepressants on and off for 20 yr   GERD (gastroesophageal reflux disease)    silent with hiatal hernia   Hiatal hernia 05/22/2013   Hiatal hernia with gastroesophageal reflux 05/22/2013   Hot tub folliculitis 12/18/2013   Hyperglycemia 05/19/2016   Hyperlipidemia 05/19/2016   Measles as a child   Osteopenia after menopause 09/23/2022   DEXA   Postmenopausal atrophic vaginitis 05/22/2013   Preventative health care 01/19/2015   Shingles 68 yrs old   Unspecified vitamin D  deficiency 05/22/2013    Past Surgical History:  Procedure Laterality Date   ABDOMINAL HYSTERECTOMY  1998   partial-still has ovaries   BILIARY STENT PLACEMENT N/A 11/23/2019   Procedure: BILIARY STENT PLACEMENT;  Surgeon: Rollin Dover, MD;  [facility]: [facility];  Service: Endoscopy;  Laterality: N/A;   CHOLECYSTECTOMY N/A 01/27/2023   Procedure: OPEN CHOLECYSTECTOMY, DRAINAGE OF INTRA-ABDOMINAL ABSCESS;  Surgeon: Ebbie Cough, MD;  Location: [facility];  Service: General;  Laterality: N/A;   ENDOSCOPIC RETROGRADE CHOLANGIOPANCREATOGRAPHY (ERCP) WITH PROPOFOL  N/A 06/01/2020   Procedure: ENDOSCOPIC RETROGRADE CHOLANGIOPANCREATOGRAPHY (ERCP) WITH PROPOFOL ;  Surgeon: Rollin Dover, MD;  [facility];  Service: Endoscopy;  Laterality: N/A;   ERCP N/A 11/23/2019   Procedure: ENDOSCOPIC RETROGRADE CHOLANGIOPANCREATOGRAPHY (ERCP);  Surgeon: Rollin Dover, MD;  Location: [facility];  Service:  Endoscopy;  Laterality: N/A;   ESOPHAGECTOMY  05-2008   INDUCED ABORTION  1979   REMOVAL OF STONES  11/23/2019   Procedure: REMOVAL OF STONES;  Surgeon: Rollin Dover, MD;  [facility]: [facility];  Service:  Endoscopy;;   SPHINCTEROTOMY  11/23/2019   Procedure: ANNETT;  Surgeon: Rollin Dover, MD;  [facility];  Service: Endoscopy;;   STENT REMOVAL  06/01/2020   Procedure: STENT REMOVAL;  Surgeon: Rollin Dover, MD;  [facility]: [facility];  Service: Endoscopy;;    No current facility-administered medications for this encounter.   Current Outpatient Medications  Medication Sig Dispense Refill Last Dose/Taking   ascorbic acid (VITAMIN C) 500 MG tablet Take 500 mg by mouth daily.   Taking   Cholecalciferol (VITAMIN D ) 50 MCG (2000 UT) CAPS Take 2,000 Units by mouth daily. (Patient taking differently: Take 4,000 Units by mouth daily.)   Taking   Collagen-Vitamin C-Biotin (COLLAGEN PO) Take 1 Scoop by mouth daily.   Taking   magnesium  oxide (MAG-OX) 400 (240 Mg) MG tablet Take 400 mg by mouth daily.   Taking   omeprazole  (PRILOSEC) 20 MG capsule Take 1 capsule (20 mg total) by mouth daily. 90 capsule 1 Taking   venlafaxine  XR (EFFEXOR -XR) 150 MG 24 hr capsule TAKE 1 CAPSULE DAILY WITH BREAKFAST. TAKE WITH 75 MG EFFEXOR  XR FOR TOTAL DOSE OF 225 MG. 90 capsule 1 Taking   conjugated estrogens  (PREMARIN ) vaginal cream Place 0.75 Applicatorfuls vaginally daily. 42.5 g 6    meloxicam  (MOBIC ) 7.5 MG tablet Take 1 tablet (7.5 mg total) by mouth daily as needed for pain. (Patient not taking: Reported on 10/22/2023) 30 tablet 0 Not Taking   No Known Allergies  Social History   Tobacco Use   Smoking status: Former    Current packs/day: 0.10    Average packs/day: 0.1 packs/day for 25.9 years (2.6 ttl pk-yrs)    Types: Cigarettes    Start date: 01/13/1998   Smokeless tobacco: Never   Tobacco comments:    1-2 cigarettes a day  Substance Use Topics   Alcohol use: Yes    Comment: a couple glasses of wine every other day    Family History  Problem Relation Age of Onset   Cancer Mother 29       breast cancer   Emphysema Mother        smoker   Heart attack Father        X several    Mental illness Brother        bipolar, xanax and thc abuse   Obesity Brother    Depression Daughter    Anxiety disorder Daughter    Other Maternal Grandmother        blood clot, dvt, lung   Cancer Paternal Grandmother        stomach   Heart disease Paternal Grandfather      Review of Systems  Objective:  Physical Exam Vitals reviewed.  Constitutional:      Appearance: Normal appearance. She is normal weight.  HENT:     Head: Normocephalic and atraumatic.  Eyes:     Extraocular Movements: Extraocular movements intact.     Pupils: Pupils are equal, round, and reactive to light.  Cardiovascular:     Rate and Rhythm: Normal rate and regular rhythm.  Pulmonary:     Effort: Pulmonary effort is normal.     Breath sounds: Normal breath sounds.  Abdominal:     Palpations: Abdomen is soft.  Musculoskeletal:  Cervical back: Normal range of motion and neck supple.     Right hip: Tenderness and bony tenderness present. Decreased range of motion. Decreased strength.  Neurological:     Mental Status: She is alert and oriented to person, place, and time.  Psychiatric:        Behavior: Behavior normal.     Vital signs in last 24 hours:    Labs:   Estimated body mass index is 20.94 kg/m as calculated from the following:   Height as of 10/22/23: 5' 4 (1.626 m).   Weight as of 10/22/23: 55.3 kg.   Imaging Review Plain radiographs demonstrate severe degenerative joint disease of the right hip(s). The bone quality appears to be good for age and reported activity level.      Assessment/Plan:  End stage arthritis, right hip(s)  The patient history, physical examination, clinical judgement of the provider and imaging studies are consistent with end stage degenerative joint disease of the right hip(s) and total hip arthroplasty is deemed medically necessary. The treatment options including medical management, injection therapy, arthroscopy and arthroplasty were discussed at  length. The risks and benefits of total hip arthroplasty were presented and reviewed. The risks due to aseptic loosening, infection, stiffness, dislocation/subluxation,  thromboembolic complications and other imponderables were discussed.  The patient acknowledged the explanation, agreed to proceed with the plan and consent was signed. Patient is being admitted for inpatient treatment for surgery, pain control, PT, OT, prophylactic antibiotics, VTE prophylaxis, progressive ambulation and ADL's and discharge planning.The patient is planning to be discharged home with home health services

## 2023-10-29 NOTE — Care Plan (Signed)
 [facility] RNCM call to patient to discuss her upcoming Right total hip arthroplasty with Dr. Vernetta at [facility]. She is agreeable to case management. She lives with her husband and plans to return home with assistance from him. She has a RW already. Anticipate HHPT will be needed after a short hospital stay. Referral made to [facility] after choice provided. Reviewed all post op care instructions. Will continue to follow for needs.

## 2023-10-29 NOTE — Telephone Encounter (Signed)
 [facility] pre-op call completed for upcoming Right total hip arthroplasty.

## 2023-10-30 ENCOUNTER — Observation Stay (HOSPITAL_COMMUNITY)

## 2023-10-30 ENCOUNTER — Ambulatory Visit (HOSPITAL_COMMUNITY)

## 2023-10-30 ENCOUNTER — Other Ambulatory Visit: Payer: Self-pay

## 2023-10-30 ENCOUNTER — Observation Stay (HOSPITAL_COMMUNITY)
Admission: RE | Admit: 2023-10-30 | Discharge: 2023-10-31 | Disposition: A | Source: Ambulatory Visit | Attending: Orthopaedic Surgery | Admitting: Orthopaedic Surgery

## 2023-10-30 ENCOUNTER — Ambulatory Visit (HOSPITAL_COMMUNITY): Payer: Self-pay

## 2023-10-30 ENCOUNTER — Ambulatory Visit (HOSPITAL_COMMUNITY): Payer: Self-pay | Admitting: Medical

## 2023-10-30 ENCOUNTER — Encounter (HOSPITAL_COMMUNITY): Payer: Self-pay | Admitting: Orthopaedic Surgery

## 2023-10-30 ENCOUNTER — Encounter (HOSPITAL_COMMUNITY)
Admission: RE | Disposition: A | Discharge status: Home / Self Care | Payer: Self-pay | Source: Ambulatory Visit | Attending: Orthopaedic Surgery

## 2023-10-30 DIAGNOSIS — Z87891 Personal history of nicotine dependence: Secondary | ICD-10-CM | POA: Insufficient documentation

## 2023-10-30 DIAGNOSIS — M1611 Unilateral primary osteoarthritis, right hip: Secondary | ICD-10-CM | POA: Diagnosis not present

## 2023-10-30 DIAGNOSIS — Z8501 Personal history of malignant neoplasm of esophagus: Secondary | ICD-10-CM | POA: Diagnosis not present

## 2023-10-30 DIAGNOSIS — Z7982 Long term (current) use of aspirin: Secondary | ICD-10-CM | POA: Diagnosis not present

## 2023-10-30 DIAGNOSIS — Z96641 Presence of right artificial hip joint: Secondary | ICD-10-CM | POA: Diagnosis not present

## 2023-10-30 DIAGNOSIS — M25551 Pain in right hip: Secondary | ICD-10-CM | POA: Diagnosis present

## 2023-10-30 DIAGNOSIS — Z471 Aftercare following joint replacement surgery: Secondary | ICD-10-CM | POA: Diagnosis not present

## 2023-10-30 HISTORY — PX: TOTAL HIP ARTHROPLASTY: SHX124

## 2023-10-30 LAB — ABO/RH: ABO/RH(D): B POS

## 2023-10-30 SURGERY — ARTHROPLASTY, HIP, TOTAL, ANTERIOR APPROACH
Anesthesia: Spinal | Site: Hip | Laterality: Right

## 2023-10-30 MED ORDER — VITAMIN D 25 MCG (1000 UNIT) PO TABS
4000.0000 [IU] | ORAL_TABLET | Freq: Every day | ORAL | Status: DC
  Administered 2023-10-30 – 2023-10-31 (×2): 4000 [IU] via ORAL
  Filled 2023-10-30 (×2): qty 4

## 2023-10-30 MED ORDER — PHENYLEPHRINE HCL-NACL 20-0.9 MG/250ML-% IV SOLN: INTRAVENOUS | Status: DC | PRN

## 2023-10-30 MED ORDER — LACTATED RINGERS IV SOLN
INTRAVENOUS | Status: DC
Start: 2023-10-30 — End: 2023-10-30

## 2023-10-30 MED ORDER — OXYCODONE HCL 5 MG PO TABS: 5.0000 mg | ORAL_TABLET | Freq: Once | ORAL | Status: DC | PRN

## 2023-10-30 MED ORDER — BUPIVACAINE IN DEXTROSE 0.75-8.25 % IT SOLN
INTRATHECAL | Status: DC | PRN
  Administered 2023-10-30: 1.6 mL via INTRATHECAL

## 2023-10-30 MED ORDER — ONDANSETRON HCL 4 MG/2ML IJ SOLN
INTRAMUSCULAR | Status: AC
  Filled 2023-10-30: qty 2

## 2023-10-30 MED ORDER — MENTHOL 3 MG MT LOZG: 1.0000 | LOZENGE | OROMUCOSAL | Status: DC | PRN

## 2023-10-30 MED ORDER — DIPHENHYDRAMINE HCL 12.5 MG/5ML PO ELIX: 12.5000 mg | ORAL_SOLUTION | ORAL | Status: DC | PRN

## 2023-10-30 MED ORDER — ORAL CARE MOUTH RINSE
15.0000 mL | Freq: Once | OROMUCOSAL | Status: AC
Start: 2023-10-30 — End: 2023-10-30

## 2023-10-30 MED ORDER — ONDANSETRON HCL 4 MG/2ML IJ SOLN: 4.0000 mg | Freq: Once | INTRAMUSCULAR | Status: DC | PRN

## 2023-10-30 MED ORDER — VENLAFAXINE HCL ER 150 MG PO CP24
150.0000 mg | ORAL_CAPSULE | Freq: Every day | ORAL | Status: DC
  Administered 2023-10-31: 150 mg via ORAL
  Filled 2023-10-30: qty 1

## 2023-10-30 MED ORDER — OXYCODONE HCL 5 MG/5ML PO SOLN: 5.0000 mg | Freq: Once | ORAL | Status: DC | PRN

## 2023-10-30 MED ORDER — CEFAZOLIN SODIUM-DEXTROSE 2-4 GM/100ML-% IV SOLN
2.0000 g | Freq: Four times a day (QID) | INTRAVENOUS | Status: AC
  Administered 2023-10-30 (×2): 2 g via INTRAVENOUS
  Filled 2023-10-30 (×2): qty 100

## 2023-10-30 MED ORDER — MAGNESIUM OXIDE -MG SUPPLEMENT 400 (240 MG) MG PO TABS
400.0000 mg | ORAL_TABLET | Freq: Every day | ORAL | Status: DC
  Administered 2023-10-30 – 2023-10-31 (×2): 400 mg via ORAL
  Filled 2023-10-30 (×2): qty 1

## 2023-10-30 MED ORDER — MIDAZOLAM HCL 2 MG/2ML IJ SOLN
INTRAMUSCULAR | Status: AC
  Filled 2023-10-30: qty 2

## 2023-10-30 MED ORDER — PANTOPRAZOLE SODIUM 40 MG PO TBEC
40.0000 mg | DELAYED_RELEASE_TABLET | Freq: Every day | ORAL | Status: DC
  Administered 2023-10-30 – 2023-10-31 (×2): 40 mg via ORAL
  Filled 2023-10-30 (×2): qty 1

## 2023-10-30 MED ORDER — CHLORHEXIDINE GLUCONATE 0.12 % MT SOLN
15.0000 mL | Freq: Once | OROMUCOSAL | Status: AC
  Administered 2023-10-30: 15 mL via OROMUCOSAL

## 2023-10-30 MED ORDER — ASPIRIN 81 MG PO CHEW
81.0000 mg | CHEWABLE_TABLET | Freq: Two times a day (BID) | ORAL | Status: DC
  Administered 2023-10-30 – 2023-10-31 (×2): 81 mg via ORAL
  Filled 2023-10-30 (×2): qty 1

## 2023-10-30 MED ORDER — MIDAZOLAM HCL 5 MG/5ML IJ SOLN
INTRAMUSCULAR | Status: DC | PRN
  Administered 2023-10-30: 2 mg via INTRAVENOUS

## 2023-10-30 MED ORDER — PHENOL 1.4 % MT LIQD: 1.0000 | OROMUCOSAL | Status: DC | PRN

## 2023-10-30 MED ORDER — OXYCODONE HCL 5 MG PO TABS
5.0000 mg | ORAL_TABLET | ORAL | Status: DC | PRN
  Filled 2023-10-30 (×2): qty 2

## 2023-10-30 MED ORDER — POVIDONE-IODINE 10 % EX SWAB: 2.0000 | Freq: Once | CUTANEOUS | Status: DC

## 2023-10-30 MED ORDER — SODIUM CHLORIDE 0.9 % IR SOLN
Status: DC | PRN
  Administered 2023-10-30: 1000 mL

## 2023-10-30 MED ORDER — SODIUM CHLORIDE 0.9 % IV SOLN: INTRAVENOUS | Status: DC

## 2023-10-30 MED ORDER — PROPOFOL 500 MG/50ML IV EMUL
INTRAVENOUS | Status: DC | PRN
  Administered 2023-10-30: 20 mg via INTRAVENOUS
  Administered 2023-10-30: 50 ug/kg/min via INTRAVENOUS

## 2023-10-30 MED ORDER — PROPOFOL 1000 MG/100ML IV EMUL
INTRAVENOUS | Status: AC
  Filled 2023-10-30: qty 100

## 2023-10-30 MED ORDER — OXYCODONE HCL 5 MG PO TABS
10.0000 mg | ORAL_TABLET | ORAL | Status: DC | PRN
  Administered 2023-10-30 – 2023-10-31 (×5): 10 mg via ORAL
  Filled 2023-10-30: qty 3
  Filled 2023-10-30 (×2): qty 2

## 2023-10-30 MED ORDER — PHENYLEPHRINE 80 MCG/ML (10ML) SYRINGE FOR IV PUSH (FOR BLOOD PRESSURE SUPPORT)
PREFILLED_SYRINGE | INTRAVENOUS | Status: AC
  Filled 2023-10-30: qty 10

## 2023-10-30 MED ORDER — ONDANSETRON HCL 4 MG PO TABS: 4.0000 mg | ORAL_TABLET | Freq: Four times a day (QID) | ORAL | Status: DC | PRN

## 2023-10-30 MED ORDER — ONDANSETRON HCL 4 MG/2ML IJ SOLN
4.0000 mg | Freq: Four times a day (QID) | INTRAMUSCULAR | Status: DC | PRN
  Administered 2023-10-31: 4 mg via INTRAVENOUS
  Filled 2023-10-30: qty 2

## 2023-10-30 MED ORDER — METHOCARBAMOL 1000 MG/10ML IJ SOLN: 500.0000 mg | Freq: Four times a day (QID) | INTRAMUSCULAR | Status: DC | PRN

## 2023-10-30 MED ORDER — CEFAZOLIN SODIUM-DEXTROSE 2-4 GM/100ML-% IV SOLN
2.0000 g | INTRAVENOUS | Status: AC
  Administered 2023-10-30: 2 g via INTRAVENOUS
  Filled 2023-10-30: qty 100

## 2023-10-30 MED ORDER — HYDROMORPHONE HCL 1 MG/ML IJ SOLN
0.5000 mg | INTRAMUSCULAR | Status: DC | PRN
  Administered 2023-10-31: 1 mg via INTRAVENOUS
  Filled 2023-10-30: qty 1

## 2023-10-30 MED ORDER — ONDANSETRON HCL 4 MG/2ML IJ SOLN
INTRAMUSCULAR | Status: DC | PRN
  Administered 2023-10-30: 4 mg via INTRAVENOUS

## 2023-10-30 MED ORDER — DOCUSATE SODIUM 100 MG PO CAPS
100.0000 mg | ORAL_CAPSULE | Freq: Two times a day (BID) | ORAL | Status: DC
  Administered 2023-10-30 – 2023-10-31 (×2): 100 mg via ORAL
  Filled 2023-10-30 (×2): qty 1

## 2023-10-30 MED ORDER — METHOCARBAMOL 500 MG PO TABS
500.0000 mg | ORAL_TABLET | Freq: Four times a day (QID) | ORAL | Status: DC | PRN
  Administered 2023-10-30 – 2023-10-31 (×3): 500 mg via ORAL
  Filled 2023-10-30 (×3): qty 1

## 2023-10-30 MED ORDER — ALUM & MAG HYDROXIDE-SIMETH 200-200-20 MG/5ML PO SUSP: 30.0000 mL | ORAL | Status: DC | PRN

## 2023-10-30 MED ORDER — 0.9 % SODIUM CHLORIDE (POUR BTL) OPTIME
TOPICAL | Status: DC | PRN
  Administered 2023-10-30: 1000 mL

## 2023-10-30 MED ORDER — STERILE WATER FOR IRRIGATION IR SOLN
Status: DC | PRN
  Administered 2023-10-30: 2000 mL

## 2023-10-30 MED ORDER — VITAMIN C 500 MG PO TABS
500.0000 mg | ORAL_TABLET | Freq: Every day | ORAL | Status: DC
  Administered 2023-10-30 – 2023-10-31 (×2): 500 mg via ORAL
  Filled 2023-10-30 (×2): qty 1

## 2023-10-30 MED ORDER — ACETAMINOPHEN 500 MG PO TABS
1000.0000 mg | ORAL_TABLET | Freq: Once | ORAL | Status: AC
  Administered 2023-10-30: 1000 mg via ORAL
  Filled 2023-10-30: qty 2

## 2023-10-30 MED ORDER — METOCLOPRAMIDE HCL 5 MG/ML IJ SOLN: 5.0000 mg | Freq: Three times a day (TID) | INTRAMUSCULAR | Status: DC | PRN

## 2023-10-30 MED ORDER — PHENYLEPHRINE 80 MCG/ML (10ML) SYRINGE FOR IV PUSH (FOR BLOOD PRESSURE SUPPORT)
PREFILLED_SYRINGE | INTRAVENOUS | Status: DC | PRN
  Administered 2023-10-30: 40 ug via INTRAVENOUS
  Administered 2023-10-30: 80 ug via INTRAVENOUS
  Administered 2023-10-30 (×2): 40 ug via INTRAVENOUS
  Administered 2023-10-30: 80 ug via INTRAVENOUS
  Administered 2023-10-30 (×2): 40 ug via INTRAVENOUS
  Administered 2023-10-30: 80 ug via INTRAVENOUS
  Administered 2023-10-30: 40 ug via INTRAVENOUS

## 2023-10-30 MED ORDER — FENTANYL CITRATE (PF) 50 MCG/ML IJ SOSY: 25.0000 ug | PREFILLED_SYRINGE | INTRAMUSCULAR | Status: DC | PRN

## 2023-10-30 MED ORDER — ACETAMINOPHEN 325 MG PO TABS
325.0000 mg | ORAL_TABLET | Freq: Four times a day (QID) | ORAL | Status: DC | PRN
  Filled 2023-10-30: qty 2

## 2023-10-30 MED ORDER — METOCLOPRAMIDE HCL 5 MG PO TABS: 5.0000 mg | ORAL_TABLET | Freq: Three times a day (TID) | ORAL | Status: DC | PRN

## 2023-10-30 MED ADMIN — Tranexamic Acid-Sodium Chloride IV Soln 1000 MG/100ML-0.7%: 500 mg | INTRAVENOUS | NDC 80830232902

## 2023-10-30 MED FILL — Tranexamic Acid-Sodium Chloride IV Soln 1000 MG/100ML-0.7%: 1000.0000 mg | INTRAVENOUS | Qty: 100 | Status: AC

## 2023-10-30 SURGICAL SUPPLY — 37 items
BAG COUNTER SPONGE SURGICOUNT (BAG) ×1 IMPLANT
BAG ZIPLOCK 12X15 (MISCELLANEOUS) ×1 IMPLANT
BENZOIN TINCTURE PRP APPL 2/3 (GAUZE/BANDAGES/DRESSINGS) IMPLANT
BLADE SAW SGTL 18X1.27X75 (BLADE) ×1 IMPLANT
COVER PERINEAL POST (MISCELLANEOUS) ×1 IMPLANT
COVER SURGICAL LIGHT HANDLE (MISCELLANEOUS) ×1 IMPLANT
CUP ACETAB W/GRIPTION 54 (Plate) IMPLANT
DERMABOND ADVANCED .7 DNX12 (GAUZE/BANDAGES/DRESSINGS) IMPLANT
DRAPE FOOT SWITCH (DRAPES) ×1 IMPLANT
DRAPE STERI IOBAN 125X83 (DRAPES) ×1 IMPLANT
DRAPE U-SHAPE 47X51 STRL (DRAPES) ×2 IMPLANT
DRSG AQUACEL AG ADV 3.5X10 (GAUZE/BANDAGES/DRESSINGS) ×1 IMPLANT
DURAPREP 26ML APPLICATOR (WOUND CARE) ×1 IMPLANT
ELECT PENCIL ROCKER SW 15FT (MISCELLANEOUS) ×1 IMPLANT
ELECT REM PT RETURN 15FT ADLT (MISCELLANEOUS) ×1 IMPLANT
GAUZE XEROFORM 1X8 LF (GAUZE/BANDAGES/DRESSINGS) IMPLANT
GLOVE BIO SURGEON STRL SZ7.5 (GLOVE) ×1 IMPLANT
GLOVE BIOGEL PI IND STRL 8 (GLOVE) ×2 IMPLANT
GLOVE ECLIPSE 8.0 STRL XLNG CF (GLOVE) ×1 IMPLANT
GOWN STRL REUS W/ TWL XL LVL3 (GOWN DISPOSABLE) ×2 IMPLANT
HEAD CERAMIC 36 PLUS 8.5 12 14 (Hips) IMPLANT
HOLDER FOLEY CATH W/STRAP (MISCELLANEOUS) ×1 IMPLANT
KIT TURNOVER KIT A (KITS) ×1 IMPLANT
LINER NEUTRAL 54X36MM PLUS 4 (Hips) IMPLANT
PACK ANTERIOR HIP CUSTOM (KITS) ×1 IMPLANT
SET HNDPC FAN SPRY TIP SCT (DISPOSABLE) ×1 IMPLANT
STAPLER SKIN PROX 35W (STAPLE) IMPLANT
STEM FEM ACTIS HIGH SZ2 (Stem) IMPLANT
STRIP CLOSURE SKIN 1/2X4 (GAUZE/BANDAGES/DRESSINGS) IMPLANT
SUT ETHIBOND NAB CT1 #1 30IN (SUTURE) ×1 IMPLANT
SUT ETHILON 2 0 PS N (SUTURE) IMPLANT
SUT MNCRL AB 4-0 PS2 18 (SUTURE) IMPLANT
SUT VIC AB 0 CT1 36 (SUTURE) ×1 IMPLANT
SUT VIC AB 1 CT1 36 (SUTURE) ×1 IMPLANT
SUT VIC AB 2-0 CT1 TAPERPNT 27 (SUTURE) ×2 IMPLANT
TRAY FOLEY MTR SLVR 16FR STAT (SET/KITS/TRAYS/PACK) ×1 IMPLANT
YANKAUER SUCT BULB TIP NO VENT (SUCTIONS) ×1 IMPLANT

## 2023-10-30 NOTE — Interval H&P Note (Signed)
 History and Physical Interval Note: The patient understands that she is here today for a right total hip replacement to treat her significant right hip pain and arthritis.  There has been no acute or interval change in her medical status.  The risks and benefits of surgery have been discussed in detail and informed consent has been obtained.  The right operative hip has been marked.  10/30/2023 8:37 AM  Lauren [name]  has presented today for surgery, with the diagnosis of osteoarthritis right hip.  The various methods of treatment have been discussed with the patient and family. After consideration of risks, benefits and other options for treatment, the patient has consented to  Procedure(s): ARTHROPLASTY, HIP, TOTAL, ANTERIOR APPROACH (Right) as a surgical intervention.  The patient's history has been reviewed, patient examined, no change in status, stable for surgery.  I have reviewed the patient's chart and labs.  Questions were answered to the patient's satisfaction.     [name]

## 2023-10-30 NOTE — Anesthesia Postprocedure Evaluation (Signed)
 Anesthesia Post Note  Patient: Lauren [name]  Procedure(s) Performed: ARTHROPLASTY, HIP, TOTAL, ANTERIOR APPROACH (Right: Hip)     Patient location during evaluation: PACU Anesthesia Type: Spinal Level of consciousness: awake and alert Pain management: pain level controlled Vital Signs Assessment: post-procedure vital signs reviewed and stable Respiratory status: spontaneous breathing and respiratory function stable Cardiovascular status: blood pressure returned to baseline and stable Postop Assessment: spinal receding and no apparent nausea or vomiting Anesthetic complications: no   No notable events documented.  Last Vitals:  Vitals:   10/30/23 1200 10/30/23 1229  BP: 119/67 121/66  Pulse: 61 62  Resp: 13 17  Temp: (!) 36.1 C (!) 36.3 C  SpO2: 97% 99%    Last Pain:  Vitals:   10/30/23 1200  TempSrc:   PainSc: 0-No pain                 [name] Like

## 2023-10-30 NOTE — Anesthesia Preprocedure Evaluation (Addendum)
 Anesthesia Evaluation  Patient identified by MRN, date of birth, ID band Patient awake    Reviewed: Allergy & Precautions, NPO status , Patient's Chart, lab work & pertinent test results  History of Anesthesia Complications Negative for: history of anesthetic complications  Airway Mallampati: II  TM Distance: >3 FB Neck ROM: Full    Dental  (+) Dental Advisory Given, Teeth Intact   Pulmonary former smoker   Pulmonary exam normal        Cardiovascular negative cardio ROS Normal cardiovascular exam     Neuro/Psych  PSYCHIATRIC DISORDERS  Depression     Neuromuscular disease    GI/Hepatic Neg liver ROS, hiatal hernia,GERD  Medicated and Controlled,,  Endo/Other  negative endocrine ROS    Renal/GU negative Renal ROS     Musculoskeletal  (+) Arthritis , Osteoarthritis,    Abdominal   Peds  Hematology negative hematology ROS (+)   Anesthesia Other Findings   Reproductive/Obstetrics                              Anesthesia Physical Anesthesia Plan  ASA: 2  Anesthesia Plan: Spinal   Post-op Pain Management: Tylenol  PO (pre-op)*   Induction:   PONV Risk Score and Plan: 2 and Treatment may vary due to age or medical condition and Propofol  infusion  Airway Management Planned: Natural Airway and Simple Face Mask  Additional Equipment: None  Intra-op Plan:   Post-operative Plan:   Informed Consent: I have reviewed the patients History and Physical, chart, labs and discussed the procedure including the risks, benefits and alternatives for the proposed anesthesia with the patient or authorized representative who has indicated his/her understanding and acceptance.       Plan Discussed with: CRNA and Anesthesiologist  Anesthesia Plan Comments: (Labs reviewed, platelets acceptable. Discussed risks and benefits of spinal, including spinal/epidural hematoma, infection, failed block, and  PDPH. Patient expressed understanding and wished to proceed. )         Anesthesia Quick Evaluation

## 2023-10-30 NOTE — Anesthesia Procedure Notes (Signed)
 Spinal  Patient location during procedure: OR Start time: 10/30/2023 9:58 AM End time: 10/30/2023 10:01 AM Reason for block: surgical anesthesia Staffing Performed: anesthesiologist  Anesthesiologist: Lucious Debby BRAVO, MD Performed by: Lucious Debby BRAVO, MD Authorized by: Lucious Debby BRAVO, MD   Preanesthetic Checklist Completed: patient identified, IV checked, risks and benefits discussed, surgical consent, monitors and equipment checked, pre-op evaluation and timeout performed Spinal Block Patient position: sitting Prep: DuraPrep Patient monitoring: heart rate, cardiac monitor, continuous pulse ox and blood pressure Approach: midline Location: L3-4 Injection technique: single-shot Needle Needle type: Pencan  Needle gauge: 24 G Additional Notes Consent was obtained prior to the procedure with all questions answered and concerns addressed. Risks including, but not limited to, bleeding, infection, nerve damage, paralysis, failed block, inadequate analgesia, allergic reaction, high spinal, itching, and headache were discussed and the patient wished to proceed. Functioning IV was confirmed and monitors were applied. Sterile prep and drape, including hand hygiene, mask, and sterile gloves were used. The patient was positioned and the spine was prepped. The skin was anesthetized with lidocaine . Free flow of clear CSF was obtained prior to injecting local anesthetic into the CSF. The spinal needle aspirated freely following injection. The needle was carefully withdrawn. The patient tolerated the procedure well.   Debby Lucious, MD

## 2023-10-30 NOTE — Evaluation (Signed)
 Physical Therapy Evaluation Patient Details Name: Lauren [name] MRN: [id number] DOB: [birth year] Today's Date: 10/30/2023  History of Present Illness  Pt s/p R THR and with hx of esophageal CA  Clinical Impression  Pt s/p R THR and presents with functional mobility limitations 2* decreased R LE strength/ROM and post op pain.  Pt should progress to dc home with family assist.        If plan is discharge home, recommend the following: A little help with walking and/or transfers;A little help with bathing/dressing/bathroom;Assistance with cooking/housework;Help with stairs or ramp for entrance;Assist for transportation   Can travel by private vehicle        Equipment Recommendations None recommended by PT  Recommendations for Other Services       Functional Status Assessment Patient has had a recent decline in their functional status and demonstrates the ability to make significant improvements in function in a reasonable and predictable amount of time.     Precautions / Restrictions Precautions Precautions: Fall Restrictions Weight Bearing Restrictions Per Provider Order: Yes RLE Weight Bearing Per Provider Order: Weight bearing as tolerated      Mobility  Bed Mobility Overal bed mobility: Needs Assistance Bed Mobility: Supine to Sit     Supine to sit: Min assist     General bed mobility comments: Increased time with cues for sequence and use of L LE to self assist    Transfers Overall transfer level: Needs assistance Equipment used: Rolling walker (2 wheels) Transfers: Sit to/from Stand Sit to Stand: Min assist           General transfer comment: cues for LE management and use of UEs to self assist    Ambulation/Gait Ambulation/Gait assistance: Min assist Gait Distance (Feet): 90 Feet Assistive device: Rolling walker (2 wheels) Gait Pattern/deviations: Step-to pattern, Step-through pattern, Decreased step length - right, Decreased step length - left,  Shuffle, Trunk flexed Gait velocity: decr     General Gait Details: cues for sequence, posture and position from [employer]            Wheelchair Mobility     Tilt Bed    Modified Rankin (Stroke Patients Only)       Balance Overall balance assessment: Needs assistance Sitting-balance support: No upper extremity supported, Feet supported Sitting balance-Leahy Scale: Good     Standing balance support: Bilateral upper extremity supported Standing balance-Leahy Scale: Poor                               Pertinent Vitals/Pain Pain Assessment Pain Assessment: 0-10 Pain Score: 6  Pain Location: R hip Pain Descriptors / Indicators: Aching, Sore Pain Intervention(s): Limited activity within patient's tolerance, Monitored during session, Premedicated before session, Ice applied    Home Living Family/patient expects to be discharged to:: Private residence Living Arrangements: Spouse/significant other Available Help at Discharge: Family;Available 24 hours/day Type of Home: House Home Access: Stairs to enter Entrance Stairs-Rails: Right Entrance Stairs-Number of Steps: 9 Alternate Level Stairs-Number of Steps: 4 Home Layout: Multi-level Home Equipment: Agricultural consultant (2 wheels);Cane - single point;BSC/3in1      Prior Function Prior Level of Function : Independent/Modified Independent;Driving                     Extremity/Trunk Assessment   Upper Extremity Assessment Upper Extremity Assessment: Overall WFL for tasks assessed    Lower Extremity Assessment Lower Extremity Assessment: RLE deficits/detail  Cervical / Trunk Assessment Cervical / Trunk Assessment: Normal  Communication   Communication Communication: No apparent difficulties    Cognition Arousal: Alert Behavior During Therapy: WFL for tasks assessed/performed   PT - Cognitive impairments: No apparent impairments                         Following commands:  Intact       Cueing Cueing Techniques: Verbal cues     General Comments      Exercises Total Joint Exercises Ankle Circles/Pumps: AROM, Both, 15 reps, Supine   Assessment/Plan    PT Assessment Patient needs continued PT services  PT Problem List Decreased strength;Decreased range of motion;Decreased activity tolerance;Decreased balance;Decreased mobility;Decreased knowledge of use of DME;Pain       PT Treatment Interventions DME instruction;Gait training;Stair training;Functional mobility training;Therapeutic activities;Therapeutic exercise;Patient/family education    PT Goals (Current goals can be found in the Care Plan section)  Acute Rehab PT Goals Patient Stated Goal: Regain IND PT Goal Formulation: With patient Time For Goal Achievement: 11/06/23 Potential to Achieve Goals: Good    Frequency 7X/week     Co-evaluation               AM-PAC PT 6 Clicks Mobility  Outcome Measure Help needed turning from your back to your side while in a flat bed without using bedrails?: A Little Help needed moving from lying on your back to sitting on the side of a flat bed without using bedrails?: A Little Help needed moving to and from a bed to a chair (including a wheelchair)?: A Little Help needed standing up from a chair using your arms (e.g., wheelchair or bedside chair)?: A Little Help needed to walk in hospital room?: A Little Help needed climbing 3-5 steps with a railing? : A Lot 6 Click Score: 17    End of Session Equipment Utilized During Treatment: Gait belt Activity Tolerance: Patient tolerated treatment well Patient left: in bed;with call bell/phone within reach;with chair alarm set;with nursing/sitter in room Nurse Communication: Mobility status PT Visit Diagnosis: Difficulty in walking, not elsewhere classified (R26.2)    Time: 8454-8388 PT Time Calculation (min) (ACUTE ONLY): 26 min   Charges:   PT Evaluation $PT Eval Low Complexity: 1 Low PT  Treatments $Gait Training: 8-22 mins PT General Charges $$ ACUTE PT VISIT: 1 Visit         [facility] PT Acute Rehabilitation Services Office [phone]   Nili Honda 10/30/2023, 4:38 PM

## 2023-10-30 NOTE — Op Note (Signed)
 Operative Note  Date of operation: 10/30/2023 Preoperative diagnosis: Right hip primary osteoarthritis Postoperative diagnosis: Same  Procedure: Right direct anterior total hip arthroplasty Implants:  Implant Name Type Inv. Item Serial No. Manufacturer Lot No. LRB No. Used Action  LINER NEUTRAL [id number] PLUS 4 - [id number] Hips LINER NEUTRAL 54X36MM PLUS 4  [employer] [id number] Right 1 Implanted  CUP ACETAB W/GRIPTION 54 - [id number] Plate CUP ACETAB W/GRIPTION 54  [employer] [id number] Right 1 Implanted  STEM FEM ACTIS HIGH SZ2 - [id number] Stem STEM FEM ACTIS HIGH SZ2  DEPUY ORTH[employer] [id number] Right 1 Implanted  HEAD CERAMIC 36 PLUS 8.5 12 14  - [id number] Hips HEAD CERAMIC 36 PLUS 8.5 12 14   [employer]TH[employer] [id number] Right 1 Implanted   Surgeon: Lonni GRADE. Vernetta, MD Assistant: Tory Gaskins, PA-C  Anesthesia: Spinal EBL: 50 cc Antibiotics: IV Ancef Complications: None  Indications: The patient is a very pleasant 68 year old female with debilitating arthritis involving her right hip.  She only started hurting significantly with the right hip earlier this year after a hard mechanical fall.  Her x-rays show complete loss of joint space with bone-on-bone wear.  At this point her right hip pain is daily and it is detrimentally affecting her mobility, quality of life and her actives daily living.  We have recommended total hip arthroplasty and she agrees as well.  We did describe the risks of acute blood loss anemia, nerve and vessel injury, fracture, infection, DVT, dislocation, implant failure, wound healing issues and leg length differences.  We did discuss the goals of hopefully decreased pain, improved mobility and improve quality of life.  Procedure description: After informed consent was obtained the appropriate right hip was marked, the patient was brought to the operating room and set up on the stretcher where spinal anesthesia was obtained.  She was laid in spine  position on stretcher and a Foley catheter was placed.  Next traction boots were placed on both her feet and she was placed supine on the Hana fracture table with a perineal post and placed in both legs and inline skeletal traction devices with no traction applied.  Her right operative hip and pelvis were assessed radiographically.  The right hip was prepped and draped in DuraPrep and sterile drapes.  A timeout was called and she was identified as the correct patient and the correct right hip.  An incision was then made just inferior and posterior to the ASIS and carried slightly obliquely down the leg.  Dissection was carried down to the tensor fascia lata muscle and the tensor fascia was then divided to proceed with a direct tender approach the hip.  Circumflex vessels were identified and cauterized.  The hip capsule identified and opened up in L-type format.  Cobra retractors were placed around the medial and lateral femoral neck and a femoral neck cut was made lost Just proximal to the lesser trochanter and this cut was completed with an osteotome.  A corkscrew guide was placed in the femoral head and the femoral head was removed in its entirety and there was a wide area devoid of cartilage.  A bent Hohmann was then placed over the medial acetabular rim and remanence of the acetabular labrum and other debris removed.  Reaming was then initiated from a size 43 reamer and stepwise increments going up to a size 53 reamer with all reamers placed under direct visitation and the last reamer also placed on direct fluoroscopy in order to obtain the depth of reaming, the inclination  and the anteversion.  The real DePuy sector GRIPTION acetabular opponent size 54 was then placed without difficulty followed by a 36+4 polythene liner based off offset.  Attention was then turned the femur.  With the right leg externally rotated to 120 degrees, extended and adducted, a Mueller retractors placed medially and a Hohmann tractor  behind the greater trochanter.  The lateral joint capsule was released and a box cutting osteotome was used to enter the femoral canal.  Broaching was initiated from a size 0 broach going up to a size 2.  With a size 2 in place we trialed a high offset femoral neck and a 36+1.5 trial head ball.  We brought the right leg over up and with traction and internal rotation reduced in the pelvis.  Based on radiographic and clinical assessment we needed more leg length.  We dislocated the hip and the try components.  We then placed the real Actis femoral component with high offset size 2, with the real 36+8.5 ceramic head ball.  Again this reduced the pelvis and we are pleased with stability as well as assessment of leg length and offset.  We are pleased mechanically and radiographically.  The soft tissue was then irrigated with normal saline solution.  The joint capsule was closed with interrupted #1 Ethibond suture followed by #1 Vicryl close tensor fascia.  0 Vicryl was used to close the deep tissue and 2-0 Vicryl was used to close subcutaneous tissue.  The skin was closed with staples.  An Aquacel dressing was applied.  The patient was taken the recovery room.  Tory Gaskins, PA-C did assist during the entire case and beginning in and his assistance was crucial and medically necessary for soft tissue management and retraction, helping guide implant placement and a layered closure of the wound.

## 2023-10-30 NOTE — Transfer of Care (Signed)
 Immediate Anesthesia Transfer of Care Note  Patient: Lauren [name]  Procedure(s) Performed: ARTHROPLASTY, HIP, TOTAL, ANTERIOR APPROACH (Right: Hip)  Patient Location: PACU  Anesthesia Type:Spinal  Level of Consciousness: drowsy and patient cooperative  Airway & Oxygen Therapy: Patient Spontanous Breathing and Patient connected to face mask oxygen  Post-op Assessment: Report given to RN and Post -op Vital signs reviewed and stable  Post vital signs: Reviewed and stable  Last Vitals:  Vitals Value Taken Time  BP 109/48 10/30/23 11:17  Temp    Pulse 58 10/30/23 11:21  Resp 14 10/30/23 11:21  SpO2 100 % 10/30/23 11:21  Vitals shown include unfiled device data.  Last Pain:  Vitals:   10/30/23 0804  TempSrc:   PainSc: 0-No pain         Complications: No notable events documented.

## 2023-10-31 DIAGNOSIS — M1611 Unilateral primary osteoarthritis, right hip: Secondary | ICD-10-CM | POA: Diagnosis not present

## 2023-10-31 LAB — CBC
HCT: 33.2 % — ABNORMAL LOW (ref 36.0–46.0)
Hemoglobin: 10.3 g/dL — ABNORMAL LOW (ref 12.0–15.0)
MCH: 28.7 pg (ref 26.0–34.0)
MCHC: 31 g/dL (ref 30.0–36.0)
MCV: 92.5 fL (ref 80.0–100.0)
Platelets: 305 K/uL (ref 150–400)
RBC: 3.59 MIL/uL — ABNORMAL LOW (ref 3.87–5.11)
RDW: 14.4 % (ref 11.5–15.5)
WBC: 8.1 K/uL (ref 4.0–10.5)
nRBC: 0 % (ref 0.0–0.2)

## 2023-10-31 LAB — BASIC METABOLIC PANEL WITH GFR
Anion gap: 8 (ref 5–15)
BUN: 11 mg/dL (ref 8–23)
CO2: 28 mmol/L (ref 22–32)
Calcium: 8.8 mg/dL — ABNORMAL LOW (ref 8.9–10.3)
Chloride: 100 mmol/L (ref 98–111)
Creatinine, Ser: 0.75 mg/dL (ref 0.44–1.00)
GFR, Estimated: >60 mL/min (ref >60)
Glucose, Bld: 135 mg/dL — ABNORMAL HIGH (ref 70–99)
Potassium: 4.3 mmol/L (ref 3.5–5.1)
Sodium: 136 mmol/L (ref 135–145)

## 2023-10-31 MED ORDER — OXYCODONE HCL 5 MG PO TABS: 5.0000 mg | ORAL_TABLET | ORAL | 0 refills | Status: DC | PRN

## 2023-10-31 MED ORDER — TIZANIDINE HCL 2 MG PO TABS: 2.0000 mg | ORAL_TABLET | Freq: Four times a day (QID) | ORAL | 1 refills | Status: DC | PRN

## 2023-10-31 MED ORDER — ASPIRIN 81 MG PO CHEW: 81.0000 mg | CHEWABLE_TABLET | Freq: Two times a day (BID) | ORAL | 0 refills | Status: DC

## 2023-10-31 NOTE — Discharge Instructions (Signed)
 Per [facility] clinic policy, our goal is ensure optimal postoperative pain control with a multimodal pain management strategy. For all [facility] patients, our goal is to wean post-operative narcotic medications by 6 weeks post-operatively. If this is not possible due to utilization of pain medication prior to surgery, your [facility] doctor will support your acute post-operative pain control for the first 6 weeks postoperatively, with a plan to transition you back to your primary pain team following that. Lauren [name] will work to ensure a Therapist, occupational.  INSTRUCTIONS AFTER JOINT REPLACEMENT   Remove items at home which could result in a fall. This includes throw rugs or furniture in walking pathways ICE to the affected joint every three hours while awake for 30 minutes at a time, for at least the first 3-5 days, and then as needed for pain and swelling.  Continue to use ice for pain and swelling. You may notice swelling that will progress down to the foot and ankle.  This is normal after surgery.  Elevate your leg when you are not up walking on it.   Continue to use the breathing machine you got in the hospital (incentive spirometer) which will help keep your temperature down.  It is common for your temperature to cycle up and down following surgery, especially at night when you are not up moving around and exerting yourself.  The breathing machine keeps your lungs expanded and your temperature down.   DIET:  As you were doing prior to hospitalization, we recommend a well-balanced diet.  DRESSING / WOUND CARE / SHOWERING  Keep the surgical dressing until follow up.  The dressing is water  proof, so you can shower without any extra covering.  IF THE DRESSING FALLS OFF or the wound gets wet inside, change the dressing with sterile gauze.  Please use good hand washing techniques before changing the dressing.  Do not use any lotions or creams on the incision until instructed by your surgeon.     ACTIVITY  Increase activity slowly as tolerated, but follow the weight bearing instructions below.   No driving for 6 weeks or until further direction given by your physician.  You cannot drive while taking narcotics.  No lifting or carrying greater than 10 lbs. until further directed by your surgeon. Avoid periods of inactivity such as sitting longer than an hour when not asleep. This helps prevent blood clots.  You may return to work once you are authorized by your doctor.     WEIGHT BEARING   Weight bearing as tolerated with assist device (walker, cane, etc) as directed, use it as long as suggested by your surgeon or therapist, typically at least 4-6 weeks.   EXERCISES  Results after joint replacement surgery are often greatly improved when you follow the exercise, range of motion and muscle strengthening exercises prescribed by your doctor. Safety measures are also important to protect the joint from further injury. Any time any of these exercises cause you to have increased pain or swelling, decrease what you are doing until you are comfortable again and then slowly increase them. If you have problems or questions, call your caregiver or physical therapist for advice.   Rehabilitation is important following a joint replacement. After just a few days of immobilization, the muscles of the leg can become weakened and shrink (atrophy).  These exercises are designed to build up the tone and strength of the thigh and leg muscles and to improve motion. Often times heat used for twenty to thirty minutes before  working out will loosen up your tissues and help with improving the range of motion but do not use heat for the first two weeks following surgery (sometimes heat can increase post-operative swelling).   These exercises can be done on a training (exercise) mat, on the floor, on a table or on a bed. Use whatever works the best and is most comfortable for you.    Use music or television  while you are exercising so that the exercises are a pleasant break in your day. This will make your life better with the exercises acting as a break in your routine that you can look forward to.   Perform all exercises about fifteen times, three times per day or as directed.  You should exercise both the operative leg and the other leg as well.  Exercises include:   Quad Sets - Tighten up the muscle on the front of the thigh (Quad) and hold for 5-10 seconds.   Straight Leg Raises - With your knee straight (if you were given a brace, keep it on), lift the leg to 60 degrees, hold for 3 seconds, and slowly lower the leg.  Perform this exercise against resistance later as your leg gets stronger.  Leg Slides: Lying on your back, slowly slide your foot toward your buttocks, bending your knee up off the floor (only go as far as is comfortable). Then slowly slide your foot back down until your leg is flat on the floor again.  Angel Wings: Lying on your back spread your legs to the side as far apart as you can without causing discomfort.  Hamstring Strength:  Lying on your back, push your heel against the floor with your leg straight by tightening up the muscles of your buttocks.  Repeat, but this time bend your knee to a comfortable angle, and push your heel against the floor.  You may put a pillow under the heel to make it more comfortable if necessary.   A rehabilitation program following joint replacement surgery can speed recovery and prevent re-injury in the future due to weakened muscles. Contact your doctor or a physical therapist for more information on knee rehabilitation.    CONSTIPATION  Constipation is defined medically as fewer than three stools per week and severe constipation as less than one stool per week.  Even if you have a regular bowel pattern at home, your normal regimen is likely to be disrupted due to multiple reasons following surgery.  Combination of anesthesia, postoperative  narcotics, change in appetite and fluid intake all can affect your bowels.   YOU MUST use at least one of the following options; they are listed in order of increasing strength to get the job done.  They are all available over the counter, and you may need to use some, POSSIBLY even all of these options:    Drink plenty of fluids (prune juice may be helpful) and high fiber foods Colace 100 mg by mouth twice a day  Senokot for constipation as directed and as needed Dulcolax (bisacodyl), take with full glass of water  Miralax (polyethylene glycol) once or twice a day as needed.  If you have tried all these things and are unable to have a bowel movement in the first 3-4 days after surgery call either your surgeon or your primary doctor.    If you experience loose stools or diarrhea, hold the medications until you stool forms back up.  If your symptoms do not get better within 1 week  or if they get worse, check with your doctor.  If you experience the worst abdominal pain ever or develop nausea or vomiting, please contact the office immediately for further recommendations for treatment.   ITCHING:  If you experience itching with your medications, try taking only a single pain pill, or even half a pain pill at a time.  You can also use Benadryl  over the counter for itching or also to help with sleep.   TED HOSE STOCKINGS:  Use stockings on both legs until for at least 2 weeks or as directed by physician office. They may be removed at night for sleeping.  MEDICATIONS:  See your medication summary on the "After Visit Summary" that nursing will review with you.  You may have some home medications which will be placed on hold until you complete the course of blood thinner medication.  It is important for you to complete the blood thinner medication as prescribed.  PRECAUTIONS:  If you experience chest pain or shortness of breath - call 911 immediately for transfer to the hospital emergency department.    If you develop a fever greater that 101 F, purulent drainage from wound, increased redness or drainage from wound, foul odor from the wound/dressing, or calf pain - CONTACT YOUR SURGEON.                                                   FOLLOW-UP APPOINTMENTS:  If you do not already have a post-op appointment, please call the office for an appointment to be seen by your surgeon.  Guidelines for how soon to be seen are listed in your "After Visit Summary", but are typically between 1-4 weeks after surgery.  OTHER INSTRUCTIONS:   Knee Replacement:  Do not place pillow under knee, focus on keeping the knee straight while resting. CPM instructions: 0-90 degrees, 2 hours in the morning, 2 hours in the afternoon, and 2 hours in the evening. Place foam block, curve side up under heel at all times except when in CPM or when walking.  DO NOT modify, tear, cut, or change the foam block in any way.  POST-OPERATIVE OPIOID TAPER INSTRUCTIONS: It is important to wean off of your opioid medication as soon as possible. If you do not need pain medication after your surgery it is ok to stop day one. Opioids include: Codeine, Hydrocodone(Norco, Vicodin), Oxycodone (Percocet, oxycontin ) and hydromorphone  amongst others.  Long term and even short term use of opiods can cause: Increased pain response Dependence Constipation Depression Respiratory depression And more.  Withdrawal symptoms can include Flu like symptoms Nausea, vomiting And more Techniques to manage these symptoms Hydrate well Eat regular healthy meals Stay active Use relaxation techniques(deep breathing, meditating, yoga) Do Not substitute Alcohol to help with tapering If you have been on opioids for less than two weeks and do not have pain than it is ok to stop all together.  Plan to wean off of opioids This plan should start within one week post op of your joint replacement. Maintain the same interval or time between taking each dose  and first decrease the dose.  Cut the total daily intake of opioids by one tablet each day Next start to increase the time between doses. The last dose that should be eliminated is the evening dose.   MAKE SURE YOU:  Understand these instructions.  Get help right away if you are not doing well or get worse.    Thank you for letting us  be a part of your medical care team.  It is a privilege we respect greatly.  We hope these instructions will help you stay on track for a fast and full recovery!     Dental Antibiotics:  In most cases prophylactic antibiotics for Dental procdeures after total joint surgery are not necessary.  Exceptions are as follows:  1. History of prior total joint infection  2. Severely immunocompromised (Organ Transplant, cancer chemotherapy, Rheumatoid biologic meds such as Humera)  3. Poorly controlled diabetes (A1C &gt; 8.0, blood glucose over 200)  If you have one of these conditions, contact your surgeon for an antibiotic prescription, prior to your dental procedure.

## 2023-10-31 NOTE — Progress Notes (Signed)
 Physical Therapy Treatment Patient Details Name: Lauren [name] MRN: [id number] DOB: Feb 23, 1955 Today's Date: 10/31/2023   History of Present Illness Pt s/p R THR and with hx of esophageal CA    PT Comments  Pt motivated and progressing with mobility.  Pt up to ambulate in hall with good stability, negotiated stairs, and reviewed car transfers.  Therex deferred 2* increased nausea and pain with activity.  RN aware.  Pt eager for dc home this date.    If plan is discharge home, recommend the following: A little help with walking and/or transfers;A little help with bathing/dressing/bathroom;Assistance with cooking/housework;Help with stairs or ramp for entrance;Assist for transportation   Can travel by private vehicle        Equipment Recommendations  None recommended by PT    Recommendations for Other Services       Precautions / Restrictions Precautions Precautions: Fall Restrictions Weight Bearing Restrictions Per Provider Order: No RLE Weight Bearing Per Provider Order: Weight bearing as tolerated     Mobility  Bed Mobility               General bed mobility comments: Pt up in chair and requests back to same    Transfers Overall transfer level: Needs assistance Equipment used: Rolling walker (2 wheels) Transfers: Sit to/from Stand Sit to Stand: Contact guard assist, Supervision           General transfer comment: cues for LE management and use of UEs to self assist    Ambulation/Gait Ambulation/Gait assistance: Contact guard assist, Supervision Gait Distance (Feet): 100 Feet Assistive device: Rolling walker (2 wheels) Gait Pattern/deviations: Step-to pattern, Step-through pattern, Decreased step length - right, Decreased step length - left, Shuffle, Trunk flexed Gait velocity: decr     General Gait Details: min cues for posture and position from RW   Stairs Stairs: Yes Stairs assistance: Min assist Stair Management: One rail Right, Step to  pattern, With cane, Forwards Number of Stairs: 5 General stair comments: 2+3 stairs; cues for sequence and cane placement   Wheelchair Mobility     Tilt Bed    Modified Rankin (Stroke Patients Only)       Balance Overall balance assessment: Needs assistance Sitting-balance support: No upper extremity supported, Feet supported Sitting balance-Leahy Scale: Good     Standing balance support: Single extremity supported Standing balance-Leahy Scale: Poor                              Communication Communication Communication: No apparent difficulties  Cognition Arousal: Alert Behavior During Therapy: WFL for tasks assessed/performed   PT - Cognitive impairments: No apparent impairments                         Following commands: Intact      Cueing Cueing Techniques: Verbal cues  Exercises Total Joint Exercises Ankle Circles/Pumps: AROM, Both, 15 reps, Supine    General Comments        Pertinent Vitals/Pain Pain Assessment Pain Assessment: 0-10 Pain Score: 7  Pain Location: R hip Pain Descriptors / Indicators: Aching, Sore, Burning Pain Intervention(s): Limited activity within patient's tolerance, Monitored during session, Premedicated before session, Ice applied    Home Living                          Prior Function  PT Goals (current goals can now be found in the care plan section) Acute Rehab PT Goals Patient Stated Goal: Regain IND PT Goal Formulation: With patient Time For Goal Achievement: 11/06/23 Potential to Achieve Goals: Good Progress towards PT goals: Progressing toward goals    Frequency    7X/week      PT Plan      Co-evaluation              AM-PAC PT 6 Clicks Mobility   Outcome Measure  Help needed turning from your back to your side while in a flat bed without using bedrails?: A Little Help needed moving from lying on your back to sitting on the side of a flat bed without  using bedrails?: A Little Help needed moving to and from a bed to a chair (including a wheelchair)?: A Little Help needed standing up from a chair using your arms (e.g., wheelchair or bedside chair)?: A Little Help needed to walk in hospital room?: A Little Help needed climbing 3-5 steps with a railing? : A Little 6 Click Score: 18    End of Session Equipment Utilized During Treatment: Gait belt Activity Tolerance: Patient limited by pain;Other (comment) (nausea) Patient left: in chair;with call bell/phone within reach Nurse Communication: Mobility status PT Visit Diagnosis: Difficulty in walking, not elsewhere classified (R26.2)     Time: 9152-9088 PT Time Calculation (min) (ACUTE ONLY): 24 min  Charges:    $Gait Training: 8-22 mins $Therapeutic Activity: 8-22 mins PT General Charges $$ ACUTE PT VISIT: 1 Visit                     [facility] PT Acute Rehabilitation Services Office [phone]    Shatonya Passon 10/31/2023, 9:24 AM

## 2023-10-31 NOTE — Progress Notes (Signed)
 Subjective: 1 Day Post-Op Procedure(s) (LRB): ARTHROPLASTY, HIP, TOTAL, ANTERIOR APPROACH (Right) Patient reports pain as moderate.    Objective: Vital signs in last 24 hours: Temp:  [97 F (36.1 C)-99.8 F (37.7 C)] 99.8 F (37.7 C) (10/18 9367) Pulse Rate:  [55-96] 96 (10/18 0632) Resp:  [12-18] 18 (10/18 0632) BP: (92-140)/(48-73) 119/62 (10/18 0632) SpO2:  [93 %-100 %] 94 % (10/18 9367)  Intake/Output from previous day: 10/17 0701 - 10/18 0700 In: 2592.8 [P.O.:600; I.V.:1742.8; IV Piggyback:250] Out: 2050 [Urine:2000; Blood:50] Intake/Output this shift: Total I/O In: 240 [P.O.:240] Out: -   Recent Labs    10/31/23 0345  HGB 10.3*   Recent Labs    10/31/23 0345  WBC 8.1  RBC 3.59*  HCT 33.2*  PLT 305   Recent Labs    10/31/23 0345  NA 136  K 4.3  CL 100  CO2 28  BUN 11  CREATININE 0.75  GLUCOSE 135*  CALCIUM  8.8*   No results for input(s): LABPT, INR in the last 72 hours.  Sensation intact distally Intact pulses distally Dorsiflexion/Plantar flexion intact Incision: dressing C/D/I Compartment soft   Assessment/Plan: 1 Day Post-Op Procedure(s) (LRB): ARTHROPLASTY, HIP, TOTAL, ANTERIOR APPROACH (Right) Up with therapy Discharge home with home health      Lauren [name] 10/31/2023, 11:15 AM

## 2023-10-31 NOTE — Plan of Care (Signed)
  Problem: Education: Goal: Knowledge of General Education information will improve Description: Including pain rating scale, medication(s)/side effects and non-pharmacologic comfort measures Outcome: Progressing   Problem: Health Behavior/Discharge Planning: Goal: Ability to manage health-related needs will improve Outcome: Progressing   Problem: Clinical Measurements: Goal: Ability to maintain clinical measurements within normal limits will improve Outcome: Progressing Goal: Will remain free from infection Outcome: Progressing Goal: Diagnostic test results will improve Outcome: Progressing Goal: Respiratory complications will improve Outcome: Progressing Goal: Cardiovascular complication will be avoided Outcome: Progressing   Problem: Activity: Goal: Risk for activity intolerance will decrease Outcome: Progressing   Problem: Nutrition: Goal: Adequate nutrition will be maintained Outcome: Completed/Met   Problem: Coping: Goal: Level of anxiety will decrease Outcome: Progressing   Problem: Elimination: Goal: Will not experience complications related to bowel motility Outcome: Progressing Goal: Will not experience complications related to urinary retention Outcome: Progressing   Problem: Pain Managment: Goal: General experience of comfort will improve and/or be controlled Outcome: Progressing   Problem: Safety: Goal: Ability to remain free from injury will improve Outcome: Progressing   Problem: Skin Integrity: Goal: Risk for impaired skin integrity will decrease Outcome: Progressing   Problem: Education: Goal: Knowledge of the prescribed therapeutic regimen will improve Outcome: Progressing Goal: Understanding of discharge needs will improve Outcome: Progressing Goal: Individualized Educational Video(s) Outcome: Completed/Met   Problem: Activity: Goal: Ability to avoid complications of mobility impairment will improve Outcome: Progressing Goal: Ability to  tolerate increased activity will improve Outcome: Progressing   Problem: Clinical Measurements: Goal: Postoperative complications will be avoided or minimized Outcome: Progressing   Problem: Pain Management: Goal: Pain level will decrease with appropriate interventions Outcome: Progressing   Problem: Skin Integrity: Goal: Will show signs of wound healing Outcome: Progressing

## 2023-10-31 NOTE — Discharge Summary (Signed)
 Patient ID: Lauren [name] MRN: [id number] DOB/AGE: 09/26/1955 68 y.o.  Admit date: 10/30/2023 Discharge date: 10/31/2023  Admission Diagnoses:  Principal Problem:   Unilateral primary osteoarthritis, right hip Active Problems:   Status post total replacement of right hip   Discharge Diagnoses:  Same  Past Medical History:  Diagnosis Date   Arthritis    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   Chicken pox as a child   Depression    been on antidepressants on and off for 20 yr   GERD (gastroesophageal reflux disease)    silent with hiatal hernia   Hiatal hernia 05/22/2013   Hiatal hernia with gastroesophageal reflux 05/22/2013   Hot tub folliculitis 12/18/2013   Hyperglycemia 05/19/2016   Hyperlipidemia 05/19/2016   Measles as a child   Osteopenia after menopause 09/23/2022   DEXA   Postmenopausal atrophic vaginitis 05/22/2013   Preventative health care 01/19/2015   Shingles 68 yrs old   Unspecified vitamin D  deficiency 05/22/2013    Surgeries: Procedure(s): ARTHROPLASTY, HIP, TOTAL, ANTERIOR APPROACH on 10/30/2023   Consultants:   Discharged Condition: Improved  Hospital Course: NIKOLA BLACKSTON is an 68 y.o. female who was admitted 10/30/2023 for operative treatment ofUnilateral primary osteoarthritis, right hip. Patient has severe unremitting pain that affects sleep, daily activities, and work/hobbies. After pre-op clearance the patient was taken to the operating room on 10/30/2023 and underwent  Procedure(s): ARTHROPLASTY, HIP, TOTAL, ANTERIOR APPROACH.    Patient was given perioperative antibiotics:  Anti-infectives (From admission, onward)    Start     Dose/Rate Route Frequency Ordered Stop   10/30/23 1600  ceFAZolin (ANCEF) IVPB 2g/100 mL premix        2 g 200 mL/hr over 30 Minutes Intravenous Every 6 hours 10/30/23 1224 10/30/23 2259   10/30/23 0745  ceFAZolin (ANCEF) IVPB 2g/100 mL premix        2 g 200 mL/hr over 30 Minutes Intravenous On call to  O.R. 10/30/23 0731 10/30/23 1002        Patient was given sequential compression devices, early ambulation, and chemoprophylaxis to prevent DVT.  Inpatient Morphine  Milligram Equivalents Per Day 10/17 - 10/18   Values displayed are in units of MME/Day    Order Start / End Date Yesterday Today    oxyCODONE  (Oxy IR/ROXICODONE ) immediate release tablet 5 mg 10/17 - 10/17 0 of Unknown --    oxyCODONE  (ROXICODONE ) 5 MG/5ML solution 5 mg 10/17 - 10/17 0 of Unknown --      Group total: 0 of Unknown     fentaNYL  (SUBLIMAZE ) injection 25-50 mcg 10/17 - 10/17 0 of 45-90 --    HYDROmorphone  (DILAUDID ) injection 0.5-1 mg 10/17 - No end date 0 of 30-60 20 of 60-120    oxyCODONE  (Oxy IR/ROXICODONE ) immediate release tablet 5-10 mg 10/17 - No end date 0 of 22.5-45 0 of 45-90    oxyCODONE  (Oxy IR/ROXICODONE ) immediate release tablet 10-15 mg 10/17 - No end date 45 of 45-67.5 30 of 90-135    Daily Totals  45 of Unknown (at least 142.5-262.5) 50 of 195-345    Calculation Errors     Order Type Date Details   oxyCODONE  (Oxy IR/ROXICODONE ) immediate release tablet 5 mg Ordered Dose -- Insufficient frequency information   oxyCODONE  (ROXICODONE ) 5 MG/5ML solution 5 mg Ordered Dose -- Insufficient frequency information            Patient benefited maximally from hospital stay and there were no complications.    Recent vital  signs: Patient Vitals for the past 24 hrs:  BP Temp Temp src Pulse Resp SpO2  10/31/23 0632 119/62 99.8 F (37.7 C) Oral 96 18 94 %  10/31/23 0130 133/69 98.1 F (36.7 C) Oral 90 18 98 %  10/30/23 2239 128/73 99.4 F (37.4 C) -- 83 18 96 %  10/30/23 1709 (!) 140/72 97.6 F (36.4 C) -- 70 16 99 %  10/30/23 1430 117/69 98 F (36.7 C) Oral 78 16 99 %  10/30/23 1229 121/66 (!) 97.4 F (36.3 C) -- 62 17 99 %  10/30/23 1200 119/67 (!) 97 F (36.1 C) -- 61 13 97 %  10/30/23 1145 109/62 -- -- 63 13 93 %  10/30/23 1130 (!) 92/50 -- -- (!) 58 12 100 %     Recent laboratory  studies:  Recent Labs    10/31/23 0345  WBC 8.1  HGB 10.3*  HCT 33.2*  PLT 305  NA 136  K 4.3  CL 100  CO2 28  BUN 11  CREATININE 0.75  GLUCOSE 135*  CALCIUM  8.8*     Discharge Medications:   Allergies as of 10/31/2023   No Known Allergies      Medication List     TAKE these medications    ascorbic acid 500 MG tablet Commonly known as: VITAMIN C Take 500 mg by mouth daily.   aspirin 81 MG chewable tablet Chew 1 tablet (81 mg total) by mouth 2 (two) times daily.   COLLAGEN PO Take 1 Scoop by mouth daily.   conjugated estrogens  vaginal cream Commonly known as: PREMARIN  Place 0.75 Applicatorfuls vaginally daily.   magnesium  oxide 400 (240 Mg) MG tablet Commonly known as: MAG-OX Take 400 mg by mouth daily.   omeprazole  20 MG capsule Commonly known as: PRILOSEC Take 1 capsule (20 mg total) by mouth daily.   oxyCODONE  5 MG immediate release tablet Commonly known as: Oxy IR/ROXICODONE  Take 1-2 tablets (5-10 mg total) by mouth every 4 (four) hours as needed for moderate pain (pain score 4-6) (pain score 4-6).   tiZANidine  2 MG tablet Commonly known as: ZANAFLEX  Take 1 tablet (2 mg total) by mouth every 6 (six) hours as needed for muscle spasms.   venlafaxine  XR 150 MG 24 hr capsule Commonly known as: EFFEXOR -XR TAKE 1 CAPSULE DAILY WITH BREAKFAST. TAKE WITH 75 MG EFFEXOR  XR FOR TOTAL DOSE OF 225 MG.   Vitamin D  50 MCG (2000 UT) Caps Take 2,000 Units by mouth daily. What changed: how much to take               Durable Medical Equipment  (From admission, onward)           Start     Ordered   10/30/23 1225  DME 3 n 1  Once        10/30/23 1224   10/30/23 1225  DME Walker rolling  Once       Question Answer Comment  Walker: With 5 Inch Wheels   Patient needs a walker to treat with the following condition Status post total replacement of right hip      10/30/23 1224            Diagnostic Studies: DG Pelvis Portable Result Date:  10/30/2023 CLINICAL DATA:  Status post right hip replacement. EXAM: PORTABLE PELVIS 1-2 VIEWS COMPARISON:  None Available. FINDINGS: Right hip arthroplasty in expected alignment. No periprosthetic lucency or fracture. Recent postsurgical change includes air and edema in the soft tissues. Overlying  skin staples in place. IMPRESSION: Right hip arthroplasty without immediate postoperative complication. Electronically Signed   By: Andrea Gasman M.D.   On: 10/30/2023 15:24   DG HIP UNILAT WITH PELVIS 1V RIGHT Result Date: 10/30/2023 CLINICAL DATA:  Elective surgery. EXAM: DG HIP (WITH OR WITHOUT PELVIS) 1V RIGHT COMPARISON:  06/04/2023 FINDINGS: Three fluoroscopic spot views of the pelvis and right hip obtained in the operating room. Images during hip arthroplasty. Fluoroscopy time 12 seconds. Dose 0.9005 mGy. IMPRESSION: Intraoperative fluoroscopy during right hip arthroplasty. Electronically Signed   By: Andrea Gasman M.D.   On: 10/30/2023 15:24   DG C-Arm 1-60 Min-No Report Result Date: 10/30/2023 Fluoroscopy was utilized by the requesting physician.  No radiographic interpretation.    Disposition: Discharge disposition: 01-Home or Self Care          Follow-up Information     Vernetta Lonni GRADE, MD Follow up in 2 week(s).   Specialty: Orthopedic Surgery Contact information: [name] [place] [address] [phone]         [facility] Follow up.   Specialty: Home Health Services Why: This provider will reach out to you 24-48 hours after discharge to begin Home Health PT services. Contact information: [address] [place] [address] [phone]                  Signed: Lonni GRADE Vernetta 10/31/2023, 11:24 AM

## 2023-10-31 NOTE — TOC Transition Note (Signed)
 Transition of Care [facility]) - Discharge Note   Patient Details  Name: Lauren [name] MRN: [id number] Date of Birth: [birth year]  Transition of Care [facility]) CM/SW Contact:  Heather DELENA Saltness, LCSW Phone Number: 10/31/2023, 10:13 AM   Clinical Narrative:    CSW met with pt and husband, Signe Kida, at bedside to discuss discharge planning. Pt discharging home today with [facility] through [facility], pre-arranged at Ortho office. Pt denies any DME needs, states she has RW at home. Pt's husband to transport pt home upon discharge. No further TOC needs at this time.   Final next level of care: Home w Home Health Services Barriers to Discharge: Barriers Resolved   Patient Goals and CMS Choice Patient states their goals for this hospitalization and ongoing recovery are:: To return home        Discharge Placement  Home              Patient to be transferred to facility by: Pt's spouse Name of family member notified: Signe Kida Patient and family notified of of transfer: 10/31/23  Discharge Plan and Services Additional resources added to the After Visit Summary for  Follow Up                DME Arranged: N/A DME Agency: NA       HH Arranged: NA HH Agency: NA        Social Drivers of Health (SDOH) Interventions SDOH Screenings   Food Insecurity: No Food Insecurity (10/30/2023)  Housing: Unknown (10/30/2023)  Transportation Needs: No Transportation Needs (10/30/2023)  Utilities: Not At Risk (10/30/2023)  Alcohol Screen: Low Risk  (06/25/2023)  Depression (PHQ2-9): Low Risk  (10/22/2023)  Financial Resource Strain: Low Risk  (06/25/2023)  Physical Activity: Insufficiently Active (06/25/2023)  Social Connections: Unknown (10/30/2023)  Stress: No Stress Concern Present (06/25/2023)  Tobacco Use: Medium Risk (10/30/2023)  Health Literacy: Adequate Health Literacy (06/25/2023)     Readmission Risk Interventions     No data to display          Signed: Heather Saltness, MSW,  LCSW Clinical Social Worker Inpatient Care Management 10/31/2023 10:15 AM

## 2023-10-31 NOTE — Care Management Obs Status (Signed)
 MEDICARE OBSERVATION STATUS NOTIFICATION   Patient Details  Name: Lauren [name] MRN: [id number] Date of Birth: [birth year]   Medicare Observation Status Notification Given:  Yes    Shmuel Girgis A Pam Vanalstine, LCSW 10/31/2023, 9:52 AM

## 2023-10-31 NOTE — Progress Notes (Signed)
 Physical Therapy Treatment Patient Details Name: Lauren [name] MRN: [id number] DOB: 12-26-1955 Today's Date: 10/31/2023   History of Present Illness Pt s/p R THR and with hx of esophageal CA    PT Comments  Pt's spouse now present and pt requesting to review stairs again.  Pt up to ambulate in hall with good stability, negotiated stairs, and reviewed car transfers.  Spouse present for session. Pt eager for return home but reports mild nausea with activity - RN aware.    If plan is discharge home, recommend the following: A little help with walking and/or transfers;A little help with bathing/dressing/bathroom;Assistance with cooking/housework;Help with stairs or ramp for entrance;Assist for transportation   Can travel by private vehicle        Equipment Recommendations  None recommended by PT    Recommendations for Other Services       Precautions / Restrictions Precautions Precautions: Fall Restrictions Weight Bearing Restrictions Per Provider Order: No RLE Weight Bearing Per Provider Order: Weight bearing as tolerated     Mobility  Bed Mobility               General bed mobility comments: Pt up in chair and requests back to same    Transfers Overall transfer level: Needs assistance Equipment used: Rolling walker (2 wheels) Transfers: Sit to/from Stand Sit to Stand: Contact guard assist, Supervision           General transfer comment: cues for LE management and use of UEs to self assist    Ambulation/Gait Ambulation/Gait assistance: Contact guard assist, Supervision Gait Distance (Feet): 100 Feet Assistive device: Rolling walker (2 wheels) Gait Pattern/deviations: Step-to pattern, Step-through pattern, Decreased step length - right, Decreased step length - left, Shuffle, Trunk flexed Gait velocity: decr     General Gait Details: min cues for posture and position from RW   Stairs Stairs: Yes Stairs assistance: Min assist Stair Management: One  rail Right, Step to pattern, With cane, Forwards Number of Stairs: 3 General stair comments: min cues for sequence and cane placement   Wheelchair Mobility     Tilt Bed    Modified Rankin (Stroke Patients Only)       Balance Overall balance assessment: Needs assistance Sitting-balance support: No upper extremity supported, Feet supported Sitting balance-Leahy Scale: Good     Standing balance support: No upper extremity supported Standing balance-Leahy Scale: Fair                              Hotel manager: No apparent difficulties  Cognition Arousal: Alert Behavior During Therapy: WFL for tasks assessed/performed   PT - Cognitive impairments: No apparent impairments                         Following commands: Intact      Cueing Cueing Techniques: Verbal cues  Exercises Total Joint Exercises Ankle Circles/Pumps: AROM, Both, 15 reps, Supine    General Comments        Pertinent Vitals/Pain Pain Assessment Pain Assessment: 0-10 Pain Score: 6  Pain Location: R hip Pain Descriptors / Indicators: Aching, Sore, Burning Pain Intervention(s): Limited activity within patient's tolerance, Monitored during session, Premedicated before session    Home Living                          Prior Function  PT Goals (current goals can now be found in the care plan section) Acute Rehab PT Goals Patient Stated Goal: Regain IND PT Goal Formulation: With patient Time For Goal Achievement: 11/06/23 Potential to Achieve Goals: Good Progress towards PT goals: Progressing toward goals    Frequency    7X/week      PT Plan      Co-evaluation              AM-PAC PT 6 Clicks Mobility   Outcome Measure  Help needed turning from your back to your side while in a flat bed without using bedrails?: A Little Help needed moving from lying on your back to sitting on the side of a flat bed without  using bedrails?: A Little Help needed moving to and from a bed to a chair (including a wheelchair)?: A Little Help needed standing up from a chair using your arms (e.g., wheelchair or bedside chair)?: A Little Help needed to walk in hospital room?: A Little Help needed climbing 3-5 steps with a railing? : A Little 6 Click Score: 18    End of Session Equipment Utilized During Treatment: Gait belt Activity Tolerance: Patient tolerated treatment well Patient left: in chair;with call bell/phone within reach;with family/visitor present Nurse Communication: Mobility status PT Visit Diagnosis: Difficulty in walking, not elsewhere classified (R26.2)     Time: 8841-8786 PT Time Calculation (min) (ACUTE ONLY): 15 min  Charges:    $Gait Training: 8-22 mins $Therapeutic Activity: 8-22 mins PT General Charges $$ ACUTE PT VISIT: 1 Visit                     [facility] PT Acute Rehabilitation Services Office [phone]    Bjorn Hallas 10/31/2023, 12:32 PM

## 2023-11-01 DIAGNOSIS — Z9049 Acquired absence of other specified parts of digestive tract: Secondary | ICD-10-CM | POA: Diagnosis not present

## 2023-11-01 DIAGNOSIS — K222 Esophageal obstruction: Secondary | ICD-10-CM | POA: Diagnosis not present

## 2023-11-01 DIAGNOSIS — Z8501 Personal history of malignant neoplasm of esophagus: Secondary | ICD-10-CM | POA: Diagnosis not present

## 2023-11-01 DIAGNOSIS — K449 Diaphragmatic hernia without obstruction or gangrene: Secondary | ICD-10-CM | POA: Diagnosis not present

## 2023-11-01 DIAGNOSIS — Z87891 Personal history of nicotine dependence: Secondary | ICD-10-CM | POA: Diagnosis not present

## 2023-11-01 DIAGNOSIS — Z6820 Body mass index (BMI) 20.0-20.9, adult: Secondary | ICD-10-CM | POA: Diagnosis not present

## 2023-11-01 DIAGNOSIS — Z79891 Long term (current) use of opiate analgesic: Secondary | ICD-10-CM | POA: Diagnosis not present

## 2023-11-01 DIAGNOSIS — G5620 Lesion of ulnar nerve, unspecified upper limb: Secondary | ICD-10-CM | POA: Diagnosis not present

## 2023-11-01 DIAGNOSIS — E785 Hyperlipidemia, unspecified: Secondary | ICD-10-CM | POA: Diagnosis not present

## 2023-11-01 DIAGNOSIS — M19042 Primary osteoarthritis, left hand: Secondary | ICD-10-CM | POA: Diagnosis not present

## 2023-11-01 DIAGNOSIS — Z7982 Long term (current) use of aspirin: Secondary | ICD-10-CM | POA: Diagnosis not present

## 2023-11-01 DIAGNOSIS — Z9689 Presence of other specified functional implants: Secondary | ICD-10-CM | POA: Diagnosis not present

## 2023-11-01 DIAGNOSIS — F32A Depression, unspecified: Secondary | ICD-10-CM | POA: Diagnosis not present

## 2023-11-01 DIAGNOSIS — E43 Unspecified severe protein-calorie malnutrition: Secondary | ICD-10-CM | POA: Diagnosis not present

## 2023-11-01 DIAGNOSIS — R1310 Dysphagia, unspecified: Secondary | ICD-10-CM | POA: Diagnosis not present

## 2023-11-01 DIAGNOSIS — I10 Essential (primary) hypertension: Secondary | ICD-10-CM | POA: Diagnosis not present

## 2023-11-01 DIAGNOSIS — M8580 Other specified disorders of bone density and structure, unspecified site: Secondary | ICD-10-CM | POA: Diagnosis not present

## 2023-11-01 DIAGNOSIS — Z96641 Presence of right artificial hip joint: Secondary | ICD-10-CM | POA: Diagnosis not present

## 2023-11-01 DIAGNOSIS — K631 Perforation of intestine (nontraumatic): Secondary | ICD-10-CM | POA: Diagnosis not present

## 2023-11-01 DIAGNOSIS — Z471 Aftercare following joint replacement surgery: Secondary | ICD-10-CM | POA: Diagnosis not present

## 2023-11-01 DIAGNOSIS — Z90710 Acquired absence of both cervix and uterus: Secondary | ICD-10-CM | POA: Diagnosis not present

## 2023-11-01 DIAGNOSIS — K219 Gastro-esophageal reflux disease without esophagitis: Secondary | ICD-10-CM | POA: Diagnosis not present

## 2023-11-01 DIAGNOSIS — E559 Vitamin D deficiency, unspecified: Secondary | ICD-10-CM | POA: Diagnosis not present

## 2023-11-02 ENCOUNTER — Encounter (HOSPITAL_COMMUNITY): Payer: Self-pay | Admitting: Orthopaedic Surgery

## 2023-11-03 DIAGNOSIS — Z471 Aftercare following joint replacement surgery: Secondary | ICD-10-CM | POA: Diagnosis not present

## 2023-11-05 MED ORDER — ESTRADIOL 0.01 % VA CREA: TOPICAL_CREAM | VAGINAL | 6 refills | Status: DC

## 2023-11-09 ENCOUNTER — Telehealth: Payer: Self-pay | Admitting: *Deleted

## 2023-11-09 ENCOUNTER — Encounter: Payer: Self-pay | Admitting: *Deleted

## 2023-11-09 DIAGNOSIS — Z6820 Body mass index (BMI) 20.0-20.9, adult: Secondary | ICD-10-CM | POA: Diagnosis not present

## 2023-11-09 DIAGNOSIS — Z9689 Presence of other specified functional implants: Secondary | ICD-10-CM | POA: Diagnosis not present

## 2023-11-09 DIAGNOSIS — I10 Essential (primary) hypertension: Secondary | ICD-10-CM | POA: Diagnosis not present

## 2023-11-09 DIAGNOSIS — F32A Depression, unspecified: Secondary | ICD-10-CM | POA: Diagnosis not present

## 2023-11-09 DIAGNOSIS — Z79891 Long term (current) use of opiate analgesic: Secondary | ICD-10-CM | POA: Diagnosis not present

## 2023-11-09 DIAGNOSIS — K631 Perforation of intestine (nontraumatic): Secondary | ICD-10-CM | POA: Diagnosis not present

## 2023-11-09 DIAGNOSIS — K219 Gastro-esophageal reflux disease without esophagitis: Secondary | ICD-10-CM | POA: Diagnosis not present

## 2023-11-09 DIAGNOSIS — Z87891 Personal history of nicotine dependence: Secondary | ICD-10-CM | POA: Diagnosis not present

## 2023-11-09 DIAGNOSIS — G5620 Lesion of ulnar nerve, unspecified upper limb: Secondary | ICD-10-CM | POA: Diagnosis not present

## 2023-11-09 DIAGNOSIS — Z96641 Presence of right artificial hip joint: Secondary | ICD-10-CM | POA: Diagnosis not present

## 2023-11-09 DIAGNOSIS — F331 Major depressive disorder, recurrent, moderate: Secondary | ICD-10-CM | POA: Diagnosis not present

## 2023-11-09 DIAGNOSIS — R1310 Dysphagia, unspecified: Secondary | ICD-10-CM | POA: Diagnosis not present

## 2023-11-09 DIAGNOSIS — E785 Hyperlipidemia, unspecified: Secondary | ICD-10-CM | POA: Diagnosis not present

## 2023-11-09 DIAGNOSIS — M8580 Other specified disorders of bone density and structure, unspecified site: Secondary | ICD-10-CM | POA: Diagnosis not present

## 2023-11-09 DIAGNOSIS — E559 Vitamin D deficiency, unspecified: Secondary | ICD-10-CM | POA: Diagnosis not present

## 2023-11-09 DIAGNOSIS — E43 Unspecified severe protein-calorie malnutrition: Secondary | ICD-10-CM | POA: Diagnosis not present

## 2023-11-09 DIAGNOSIS — Z9049 Acquired absence of other specified parts of digestive tract: Secondary | ICD-10-CM | POA: Diagnosis not present

## 2023-11-09 DIAGNOSIS — K222 Esophageal obstruction: Secondary | ICD-10-CM | POA: Diagnosis not present

## 2023-11-09 DIAGNOSIS — Z90710 Acquired absence of both cervix and uterus: Secondary | ICD-10-CM | POA: Diagnosis not present

## 2023-11-09 DIAGNOSIS — M19042 Primary osteoarthritis, left hand: Secondary | ICD-10-CM | POA: Diagnosis not present

## 2023-11-09 DIAGNOSIS — K449 Diaphragmatic hernia without obstruction or gangrene: Secondary | ICD-10-CM | POA: Diagnosis not present

## 2023-11-09 DIAGNOSIS — Z8501 Personal history of malignant neoplasm of esophagus: Secondary | ICD-10-CM | POA: Diagnosis not present

## 2023-11-09 DIAGNOSIS — Z7982 Long term (current) use of aspirin: Secondary | ICD-10-CM | POA: Diagnosis not present

## 2023-11-09 DIAGNOSIS — Z471 Aftercare following joint replacement surgery: Secondary | ICD-10-CM | POA: Diagnosis not present

## 2023-11-09 NOTE — Telephone Encounter (Unsigned)
 Copied from CRM #[id number]. Topic: Clinical - Lab/Test Results >> Nov 09, 2023 12:33 PM Taleah C wrote: Reason for CRM: pt called and requested to speak with Harlene about her most recent lab results. Please call and advise.

## 2023-11-09 NOTE — Telephone Encounter (Signed)
 Sent mychart message to pt.

## 2023-11-09 NOTE — Telephone Encounter (Signed)
-----   Message from [facility] R sent at 11/09/2023  3:30 PM EDT -----  ----- Message ----- From: Wheeler Harlene CROME, NP Sent: 11/09/2023   1:01 PM EDT To: Edison JONETTA Bunker, CMA  Please advise patient to follow up with GYN regarding hormone therapy and related lab work. I agreed to order a hormone panel; however, GYN should review and discuss these results in detail, as I do  not manage hormone therapy (HRT). A referral to GYN was made at the previous visit, and I encouraged the patient to discuss this further with GYN, as it falls within their specialty.  ----- Message ----- From: Interface, Lab In Three Zero One Sent: 10/22/2023   4:51 PM EDT To: Harlene CROME Wheeler, NP

## 2023-11-11 DIAGNOSIS — E559 Vitamin D deficiency, unspecified: Secondary | ICD-10-CM | POA: Diagnosis not present

## 2023-11-12 ENCOUNTER — Ambulatory Visit: Admitting: Orthopaedic Surgery

## 2023-11-12 ENCOUNTER — Encounter: Payer: Self-pay | Admitting: Orthopaedic Surgery

## 2023-11-12 DIAGNOSIS — Z96641 Presence of right artificial hip joint: Secondary | ICD-10-CM

## 2023-11-12 MED ORDER — ESTRADIOL 0.01 % VA CREA: TOPICAL_CREAM | VAGINAL | 6 refills | Status: AC

## 2023-11-12 NOTE — Progress Notes (Signed)
 The patient is an active and young appearing 68 year old female who comes in today for her first postoperative visit status post a right total hip replacement to treat significant right hip pain and arthritis.  She is ambulating with a cane when she is out and about but doing well overall.  She has already stopped narcotic pain medication.  She has been compliant with a baby aspirin twice daily.  Her calf is soft on the right side.  Her leg lengths are near equal.  Her right hip incision looks good.  Staples have been removed and Steri-Strips applied.  She can stop her baby aspirin twice daily.  She is just can take ibuprofen  for pain.  She can drive from our standpoint.  She knows to wait at least 3-4 more weeks before submerging underwater.  We will see her back in a month to see how she is doing overall but no x-rays are needed.

## 2023-11-15 LAB — HORMONE PANEL (T4,TSH,FSH,TESTT,SHBG,DHEA,ETC)
DHEA-Sulfate, LCMS: 55 ug/dL
Estradiol, Serum, MS: 1.6 pg/mL
Estrone Sulfate: <10 ng/dL
Follicle Stimulating Hormone: 139 m[IU]/mL
Free T-3: 2.4 pg/mL
Free Testosterone, Serum: 1.4 pg/mL
Progesterone, Serum: <10 ng/dL
Sex Hormone Binding Globulin: 70.6 nmol/L
T4: 5.6 ug/dL
TSH: 1.8 uU/mL
Testosterone, Serum (Total): 14 ng/dL
Testosterone-% Free: 1 %
Triiodothyronine (T-3), Serum: 75 ng/dL

## 2023-11-16 ENCOUNTER — Telehealth: Payer: Self-pay

## 2023-11-16 ENCOUNTER — Encounter: Payer: Self-pay | Admitting: Radiology

## 2023-11-16 ENCOUNTER — Telehealth: Payer: Self-pay | Admitting: Orthopaedic Surgery

## 2023-11-16 NOTE — Telephone Encounter (Signed)
 Patient called stating that she is having some swelling and warmness in her right hip that has been going on for a couple of days and not getting any better. Had right hip surgery on 10/30/2023.  CB# [phone].  Please advise.  Thank you.

## 2023-11-16 NOTE — Telephone Encounter (Signed)
 Pt states around her incision site its warm to touch, sore and swollen.  Pt had surgery 10/30/23

## 2023-11-16 NOTE — Telephone Encounter (Signed)
 Pt aware.

## 2023-11-19 ENCOUNTER — Telehealth: Payer: Self-pay

## 2023-11-19 NOTE — Telephone Encounter (Signed)
 Patient has been using ice I told her to use moist heat and see if that helps her if not she can call back and we can get her in for an aspiration

## 2023-11-19 NOTE — Telephone Encounter (Signed)
 Patient left VM on triage line. States she is having some swelling and doesn't know if it needs to be drained. S/P R THA- 10/30/2023.    CB  [phone]

## 2023-11-20 ENCOUNTER — Telehealth: Payer: Self-pay | Admitting: *Deleted

## 2023-11-20 NOTE — Telephone Encounter (Signed)
 Spoke with patient today to check in with her and she how she is doing. I know she called earlier in the week about swelling at the hip. She describes a large, round softball of fluid around her incision. We have her scheduled with Tory on Monday to have it looked at. Just FYI. I know you are not here.

## 2023-11-23 ENCOUNTER — Encounter: Payer: Self-pay | Admitting: Physician Assistant

## 2023-11-23 ENCOUNTER — Ambulatory Visit: Admitting: Physician Assistant

## 2023-11-23 DIAGNOSIS — F331 Major depressive disorder, recurrent, moderate: Secondary | ICD-10-CM | POA: Diagnosis not present

## 2023-11-23 DIAGNOSIS — Z96641 Presence of right artificial hip joint: Secondary | ICD-10-CM

## 2023-11-23 NOTE — Progress Notes (Signed)
 HPI: Lauren [name] 68 year old female status post right total hip arthroplasty.  States that her hip is overall doing well.  She is taking no pain medicine.  She comes in today due to swelling right side of the hip with activity.  Feels that it is painful and tight some heat.  Right hip: Surgical incisions healing well.  Good range of motion of the hip.  Ambulates assistive device.  Positive seroma.  40 cc of serosanguineous fluids aspirated.  Patient tolerates well.  Impression: Status post right total hip arthroplasty 10/30/2023  Plan she will follow-up as scheduled.  Questions were encouraged and answered at length today.

## 2023-11-26 ENCOUNTER — Ambulatory Visit

## 2023-11-26 ENCOUNTER — Ambulatory Visit: Admitting: Podiatry

## 2023-11-26 VITALS — Ht 64.0 in | Wt 121.2 lb

## 2023-11-26 DIAGNOSIS — M2012 Hallux valgus (acquired), left foot: Secondary | ICD-10-CM | POA: Diagnosis not present

## 2023-11-26 DIAGNOSIS — M21612 Bunion of left foot: Secondary | ICD-10-CM | POA: Diagnosis not present

## 2023-11-26 DIAGNOSIS — M21619 Bunion of unspecified foot: Secondary | ICD-10-CM

## 2023-11-26 DIAGNOSIS — M2042 Other hammer toe(s) (acquired), left foot: Secondary | ICD-10-CM | POA: Diagnosis not present

## 2023-11-26 NOTE — Progress Notes (Signed)
  Subjective:  Patient ID: Lauren [name], female    DOB: February 05, 1955,  MRN: [id number]  Chief Complaint  Patient presents with   Foot Problem    Rm 15 Patient is here for bunion and hammer toe of left foot. Patient is interested in non-surgical intervention at this time.    Discussed the use of AI scribe software for clinical note transcription with the patient, who gave verbal consent to proceed.  History of Present Illness Lauren [name] is a 68 year old female who presents with concerns about a bunion.  She has a bunion on her toe that is not currently causing any pain, but she is concerned about its appearance and potential future issues. She is not interested in surgery at this time, especially since she recently underwent hip replacement surgery a few weeks ago.  She has been using various devices to manage the bunion, such as toe separators at night. However, she notes that the second toe is rising and moving over. She experiences no pain when moving her foot up and down and has never experienced pain from the bunion. Despite this, she is unable to wear many of her shoes due to the bunion.  She is managing it with non-surgical methods.      Objective:    Physical Exam VASCULAR: DP and PT pulse palpable. Foot is warm and well-perfused. Capillary fill time is brisk. DERMATOLOGIC: Normal skin turgor, texture, and temperature. No open lesions, rashes, or ulcerations. NEUROLOGIC: Normal sensation to light touch and pressure. No paresthesias. ORTHOPEDIC: Left foot Moderate hallux valgus deformity and second hammer toe contracture. Good range of motion of the MTP joint of both feet. Hypermobility of the first tarsometatarsal joint. Smooth pain-free range of motion of all other examined joints.    No images are attached to the encounter.    Results RADIOLOGY Left Foot radiographs: Moderate hallux valgus deformity, increased intermetatarsal angle, increased hallux abductus  angle, sesamoid deviation (11/26/2023)   Assessment:   1. Bunion   2. Hallux valgus with bunions, left   3. Hammertoe of left foot      Plan:  Patient was evaluated and treated and all questions answered.  Assessment and Plan Assessment & Plan Hallux valgus (bunion) and second hammer toe deformity, right foot Moderate hallux valgus deformity and second hammer toe contracture in the right foot. Good range of motion in the MTP joint of both joints. Hypermobility of the first tarsometatarsal joint. Radiographs show moderate hallux valgus deformity, increased metatarsal intermetatarsal angle, hallux abductus angle, and deviation of the sesamoids. No pain reported, but concern about appearance and potential future problems. Surgery is the only definitive treatment, but she prefers to avoid it due to recent hip replacement. Conservative management with splints and spacers is currently effective. Surgery, specifically a Lapidus procedure, involves removing a wedge of bone and realigning the joint with plates and screws. Recovery involves 8 weeks of limited weight-bearing and use of a knee scooter, with full recovery in 3-4 months. Recurrence rate post-surgery is less than 5%. - Continue using splints and spacers to manage symptoms. - Monitor for pain or worsening symptoms. - Will consider surgical intervention if pain develops or symptoms worsen.      No follow-ups on file.

## 2023-12-07 DIAGNOSIS — F331 Major depressive disorder, recurrent, moderate: Secondary | ICD-10-CM | POA: Diagnosis not present

## 2023-12-09 DIAGNOSIS — F329 Major depressive disorder, single episode, unspecified: Secondary | ICD-10-CM | POA: Diagnosis not present

## 2023-12-09 DIAGNOSIS — G479 Sleep disorder, unspecified: Secondary | ICD-10-CM | POA: Diagnosis not present

## 2023-12-09 DIAGNOSIS — Z6820 Body mass index (BMI) 20.0-20.9, adult: Secondary | ICD-10-CM | POA: Diagnosis not present

## 2023-12-09 DIAGNOSIS — K219 Gastro-esophageal reflux disease without esophagitis: Secondary | ICD-10-CM | POA: Diagnosis not present

## 2023-12-09 DIAGNOSIS — N951 Menopausal and female climacteric states: Secondary | ICD-10-CM | POA: Diagnosis not present

## 2023-12-09 DIAGNOSIS — M2550 Pain in unspecified joint: Secondary | ICD-10-CM | POA: Diagnosis not present

## 2023-12-09 DIAGNOSIS — F331 Major depressive disorder, recurrent, moderate: Secondary | ICD-10-CM | POA: Diagnosis not present

## 2023-12-09 DIAGNOSIS — R6882 Decreased libido: Secondary | ICD-10-CM | POA: Diagnosis not present

## 2023-12-09 DIAGNOSIS — N898 Other specified noninflammatory disorders of vagina: Secondary | ICD-10-CM | POA: Diagnosis not present

## 2023-12-14 ENCOUNTER — Encounter: Payer: Self-pay | Admitting: Orthopaedic Surgery

## 2023-12-14 ENCOUNTER — Ambulatory Visit: Admitting: Orthopaedic Surgery

## 2023-12-14 DIAGNOSIS — Z96641 Presence of right artificial hip joint: Secondary | ICD-10-CM

## 2023-12-14 NOTE — Progress Notes (Signed)
 The patient is a 68 year old who is now 6 weeks status post a right total hip arthroplasty to treat significant right hip pain and arthritis.  She is incredibly active and has no issues at all she states.  She reports good range of motion and strength.  She is not walking with any assistive ice at all.  On exam her gait is normal.  She has excellent and full range of motion of her right hip that is pain-free.  She is way ahead of what most people are at this visit.  She will continue to increase her activities as comfort allows with no restrictions.  The next time we need to see her back is not for 6 months unless she is having any issues.  Will have a standing AP pelvis and lateral of her right hip at that visit.

## 2023-12-22 DIAGNOSIS — F331 Major depressive disorder, recurrent, moderate: Secondary | ICD-10-CM | POA: Diagnosis not present

## 2024-01-04 ENCOUNTER — Other Ambulatory Visit: Payer: Self-pay | Admitting: Family Medicine

## 2024-01-04 DIAGNOSIS — K219 Gastro-esophageal reflux disease without esophagitis: Secondary | ICD-10-CM

## 2024-01-18 DIAGNOSIS — Z1231 Encounter for screening mammogram for malignant neoplasm of breast: Secondary | ICD-10-CM

## 2024-02-08 ENCOUNTER — Encounter: Admitting: Family Medicine

## 2024-02-10 ENCOUNTER — Telehealth: Payer: Self-pay | Admitting: *Deleted

## 2024-02-10 NOTE — Telephone Encounter (Signed)
 90 day post op call completed for Right total hip replacement.

## 2024-02-12 ENCOUNTER — Other Ambulatory Visit: Payer: Self-pay | Admitting: Family Medicine

## 2024-03-02 ENCOUNTER — Ambulatory Visit (HOSPITAL_BASED_OUTPATIENT_CLINIC_OR_DEPARTMENT_OTHER)

## 2024-03-09 ENCOUNTER — Encounter: Admitting: Physician Assistant

## 2024-06-13 ENCOUNTER — Ambulatory Visit: Admitting: Orthopaedic Surgery

## 2024-06-30 ENCOUNTER — Ambulatory Visit
